# Patient Record
Sex: Female | Born: 1950
Health system: Southern US, Community
[De-identification: ages and names within clinical notes are randomized; demographics above are authoritative.]

## PROBLEM LIST (undated history)

## (undated) DIAGNOSIS — C8590 Non-Hodgkin lymphoma, unspecified, unspecified site: Secondary | ICD-10-CM

## (undated) DIAGNOSIS — R12 Heartburn: Secondary | ICD-10-CM

## (undated) DIAGNOSIS — M1990 Unspecified osteoarthritis, unspecified site: Secondary | ICD-10-CM

## (undated) DIAGNOSIS — E119 Type 2 diabetes mellitus without complications: Secondary | ICD-10-CM

## (undated) DIAGNOSIS — F419 Anxiety disorder, unspecified: Secondary | ICD-10-CM

## (undated) DIAGNOSIS — I10 Essential (primary) hypertension: Secondary | ICD-10-CM

## (undated) DIAGNOSIS — Z9484 Stem cells transplant status: Secondary | ICD-10-CM

## (undated) HISTORY — PX: TOTAL ABDOMINAL HYSTERECTOMY: SHX209

## (undated) HISTORY — DX: Unspecified osteoarthritis, unspecified site: M19.90

## (undated) HISTORY — DX: Heartburn: R12

## (undated) HISTORY — DX: Non-Hodgkin lymphoma, unspecified, unspecified site: C85.90

## (undated) HISTORY — DX: Essential (primary) hypertension: I10

## (undated) HISTORY — DX: Anxiety disorder, unspecified: F41.9

## (undated) HISTORY — DX: Type 2 diabetes mellitus without complications: E11.9

---

## 1997-06-06 ENCOUNTER — Ambulatory Visit (HOSPITAL_COMMUNITY): Admission: RE | Admit: 1997-06-06 | Discharge: 1997-06-06 | Payer: Self-pay | Admitting: Gynecology

## 1997-09-05 ENCOUNTER — Other Ambulatory Visit: Admission: RE | Admit: 1997-09-05 | Discharge: 1997-09-05 | Payer: Self-pay | Admitting: Gynecology

## 1998-11-13 ENCOUNTER — Other Ambulatory Visit: Admission: RE | Admit: 1998-11-13 | Discharge: 1998-11-13 | Payer: Self-pay | Admitting: Gynecology

## 1999-02-05 ENCOUNTER — Encounter: Payer: Self-pay | Admitting: Gynecology

## 1999-02-05 ENCOUNTER — Ambulatory Visit (HOSPITAL_COMMUNITY): Admission: RE | Admit: 1999-02-05 | Discharge: 1999-02-05 | Payer: Self-pay | Admitting: Gynecology

## 2000-02-11 ENCOUNTER — Ambulatory Visit (HOSPITAL_COMMUNITY): Admission: RE | Admit: 2000-02-11 | Discharge: 2000-02-11 | Payer: Self-pay | Admitting: Gynecology

## 2000-02-11 ENCOUNTER — Encounter: Payer: Self-pay | Admitting: Gynecology

## 2000-02-11 ENCOUNTER — Other Ambulatory Visit: Admission: RE | Admit: 2000-02-11 | Discharge: 2000-02-11 | Payer: Self-pay | Admitting: Gynecology

## 2000-02-13 ENCOUNTER — Encounter: Payer: Self-pay | Admitting: Gynecology

## 2000-02-13 ENCOUNTER — Encounter: Admission: RE | Admit: 2000-02-13 | Discharge: 2000-02-13 | Payer: Self-pay | Admitting: Gynecology

## 2000-10-27 ENCOUNTER — Observation Stay (HOSPITAL_COMMUNITY): Admission: EM | Admit: 2000-10-27 | Discharge: 2000-10-28 | Payer: Self-pay | Admitting: Emergency Medicine

## 2000-10-27 ENCOUNTER — Encounter: Payer: Self-pay | Admitting: Emergency Medicine

## 2001-02-16 ENCOUNTER — Encounter: Admission: RE | Admit: 2001-02-16 | Discharge: 2001-02-16 | Payer: Self-pay | Admitting: Gynecology

## 2001-02-16 ENCOUNTER — Encounter: Payer: Self-pay | Admitting: Gynecology

## 2001-06-01 ENCOUNTER — Other Ambulatory Visit: Admission: RE | Admit: 2001-06-01 | Discharge: 2001-06-01 | Payer: Self-pay | Admitting: Gynecology

## 2001-11-30 ENCOUNTER — Encounter: Admission: RE | Admit: 2001-11-30 | Discharge: 2001-11-30 | Payer: Self-pay | Admitting: Thoracic Surgery

## 2001-11-30 ENCOUNTER — Encounter: Payer: Self-pay | Admitting: Thoracic Surgery

## 2001-12-06 ENCOUNTER — Encounter: Admission: RE | Admit: 2001-12-06 | Discharge: 2001-12-06 | Payer: Self-pay | Admitting: Thoracic Surgery

## 2001-12-06 ENCOUNTER — Encounter: Payer: Self-pay | Admitting: Thoracic Surgery

## 2001-12-17 ENCOUNTER — Ambulatory Visit (HOSPITAL_COMMUNITY): Admission: RE | Admit: 2001-12-17 | Discharge: 2001-12-17 | Payer: Self-pay | Admitting: Oncology

## 2001-12-17 ENCOUNTER — Encounter (HOSPITAL_COMMUNITY): Payer: Self-pay | Admitting: Oncology

## 2001-12-30 ENCOUNTER — Encounter (INDEPENDENT_AMBULATORY_CARE_PROVIDER_SITE_OTHER): Payer: Self-pay

## 2001-12-30 ENCOUNTER — Ambulatory Visit (HOSPITAL_COMMUNITY): Admission: RE | Admit: 2001-12-30 | Discharge: 2001-12-30 | Payer: Self-pay | Admitting: Oncology

## 2002-01-13 DIAGNOSIS — C8590 Non-Hodgkin lymphoma, unspecified, unspecified site: Secondary | ICD-10-CM

## 2002-01-13 HISTORY — DX: Non-Hodgkin lymphoma, unspecified, unspecified site: C85.90

## 2002-01-14 ENCOUNTER — Ambulatory Visit (HOSPITAL_COMMUNITY): Admission: RE | Admit: 2002-01-14 | Discharge: 2002-01-14 | Payer: Self-pay | Admitting: Oncology

## 2002-01-14 ENCOUNTER — Encounter (HOSPITAL_COMMUNITY): Payer: Self-pay | Admitting: Oncology

## 2002-05-24 ENCOUNTER — Encounter (HOSPITAL_COMMUNITY): Payer: Self-pay | Admitting: Oncology

## 2002-05-24 ENCOUNTER — Ambulatory Visit (HOSPITAL_COMMUNITY): Admission: RE | Admit: 2002-05-24 | Discharge: 2002-05-24 | Payer: Self-pay | Admitting: Oncology

## 2002-05-26 ENCOUNTER — Encounter (HOSPITAL_COMMUNITY): Payer: Self-pay | Admitting: Oncology

## 2002-05-26 ENCOUNTER — Ambulatory Visit (HOSPITAL_COMMUNITY): Admission: RE | Admit: 2002-05-26 | Discharge: 2002-05-26 | Payer: Self-pay | Admitting: Oncology

## 2002-05-27 ENCOUNTER — Encounter (HOSPITAL_COMMUNITY): Payer: Self-pay | Admitting: Oncology

## 2002-05-27 ENCOUNTER — Ambulatory Visit (HOSPITAL_COMMUNITY): Admission: RE | Admit: 2002-05-27 | Discharge: 2002-05-27 | Payer: Self-pay | Admitting: Oncology

## 2002-06-08 ENCOUNTER — Ambulatory Visit (HOSPITAL_COMMUNITY): Admission: RE | Admit: 2002-06-08 | Discharge: 2002-06-08 | Payer: Self-pay | Admitting: Oncology

## 2002-06-08 ENCOUNTER — Encounter (INDEPENDENT_AMBULATORY_CARE_PROVIDER_SITE_OTHER): Payer: Self-pay | Admitting: *Deleted

## 2003-06-20 ENCOUNTER — Other Ambulatory Visit: Admission: RE | Admit: 2003-06-20 | Discharge: 2003-06-20 | Payer: Self-pay | Admitting: Gynecology

## 2004-07-02 ENCOUNTER — Encounter: Admission: RE | Admit: 2004-07-02 | Discharge: 2004-07-02 | Payer: Self-pay | Admitting: Gynecology

## 2005-07-23 ENCOUNTER — Other Ambulatory Visit: Admission: RE | Admit: 2005-07-23 | Discharge: 2005-07-23 | Payer: Self-pay | Admitting: Gynecology

## 2005-08-12 ENCOUNTER — Encounter: Admission: RE | Admit: 2005-08-12 | Discharge: 2005-08-12 | Payer: Self-pay | Admitting: Gynecology

## 2006-08-04 ENCOUNTER — Other Ambulatory Visit: Admission: RE | Admit: 2006-08-04 | Discharge: 2006-08-04 | Payer: Self-pay | Admitting: Gynecology

## 2006-08-25 ENCOUNTER — Encounter: Admission: RE | Admit: 2006-08-25 | Discharge: 2006-08-25 | Payer: Self-pay | Admitting: Gynecology

## 2008-07-21 ENCOUNTER — Encounter: Admission: RE | Admit: 2008-07-21 | Discharge: 2008-07-21 | Payer: Self-pay | Admitting: Emergency Medicine

## 2008-07-21 IMAGING — US US ABDOMEN COMPLETE
1 series · 13 of 25 positions shown · non-contrast
Comparison: None

CLINICAL DATA: 3 days abdominal pain, nausea, diabetes,
hypertension, smoker.

COMPLETE ABDOMINAL ULTRASOUND

[Series 1: us abdomen complete · 0.28mm/px · 13 of 76 slices shown]
[im 1/76]
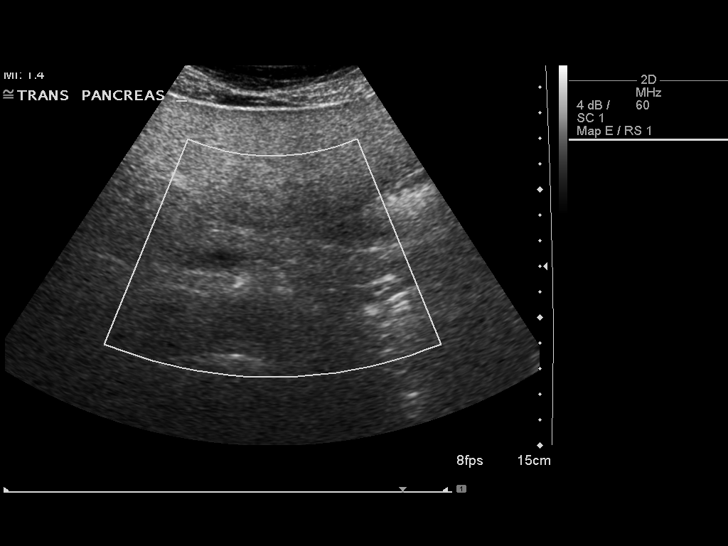
[im 7/76]
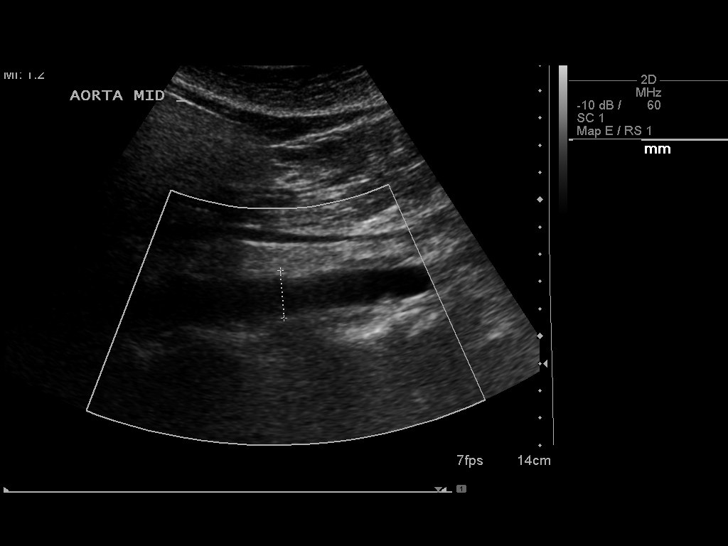
[im 13/76]
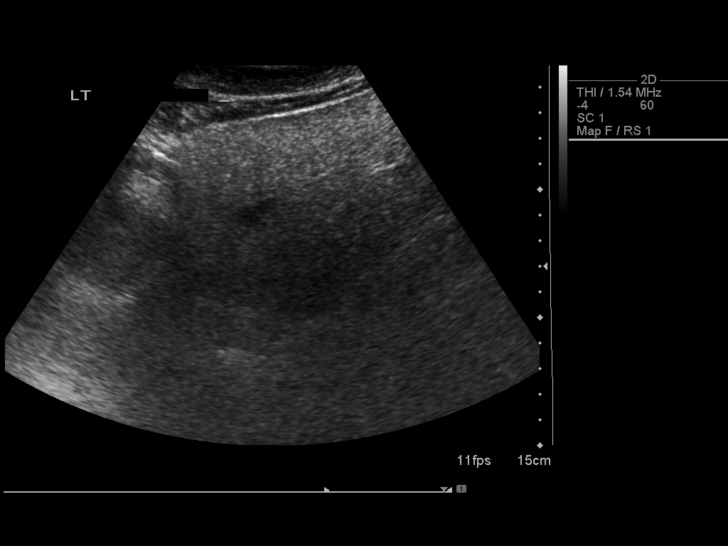
[im 19/76]
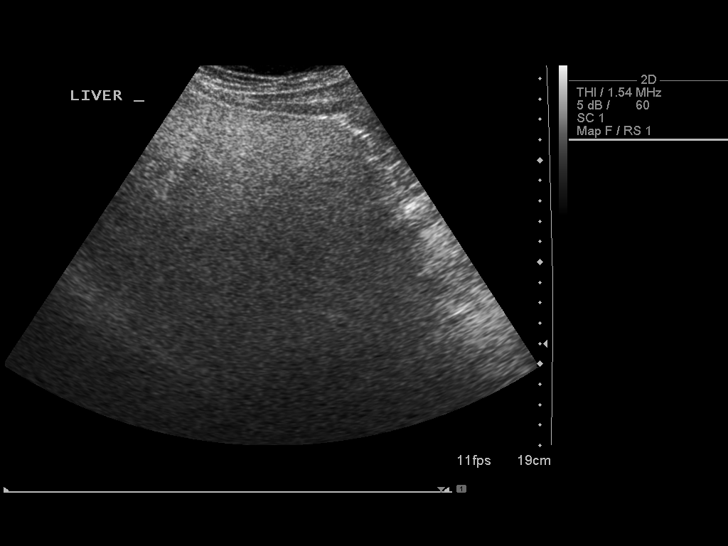
[im 26/76]
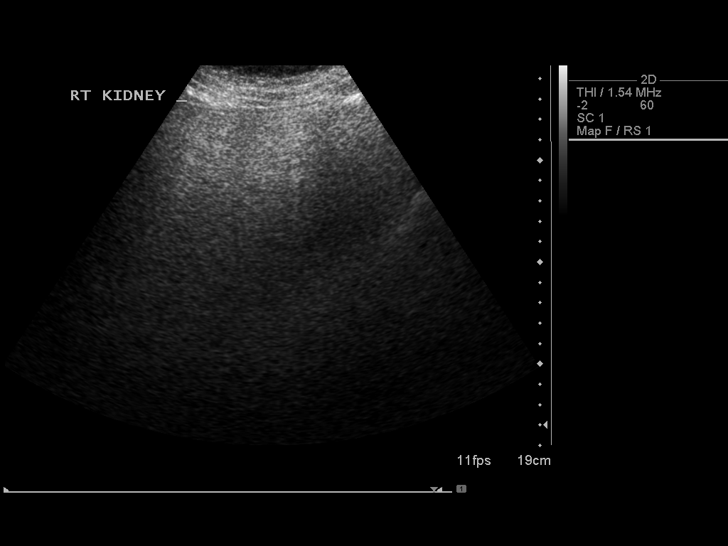
[im 32/76]
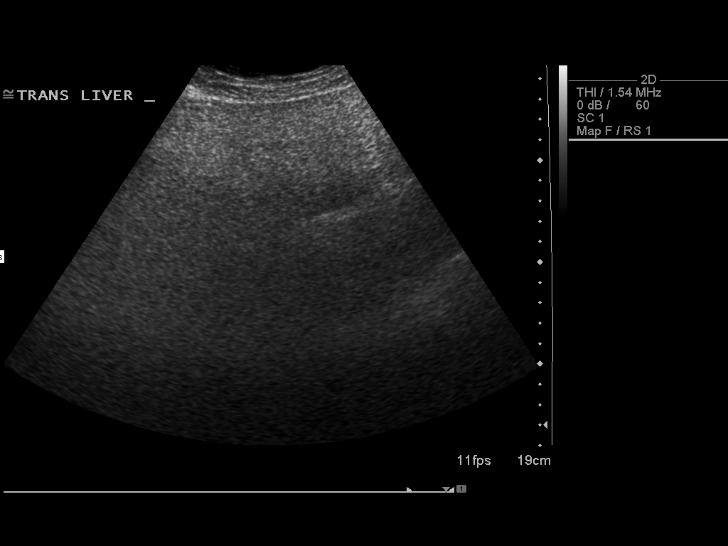
[im 38/76]
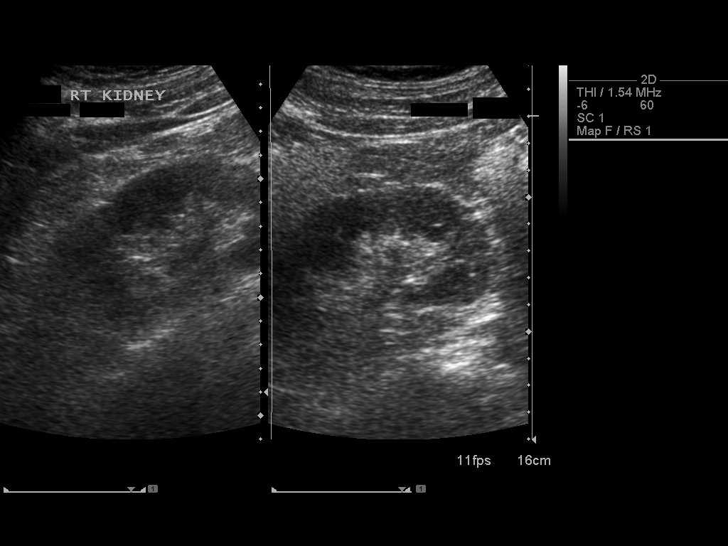
[im 44/76]
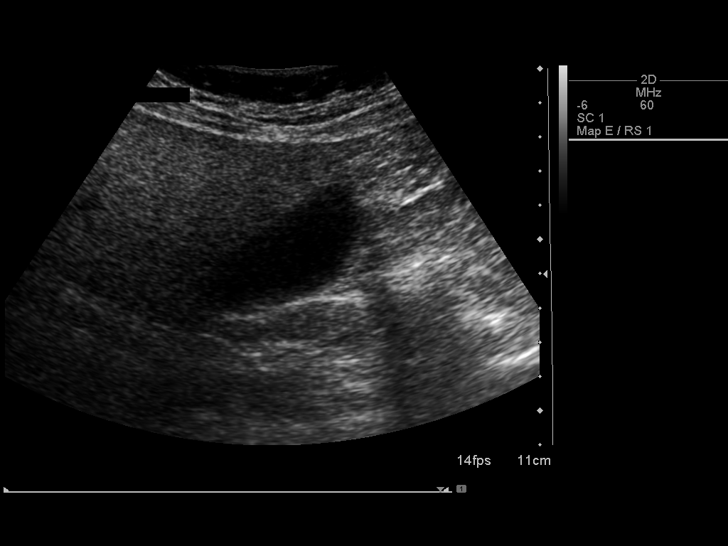
[im 51/76]
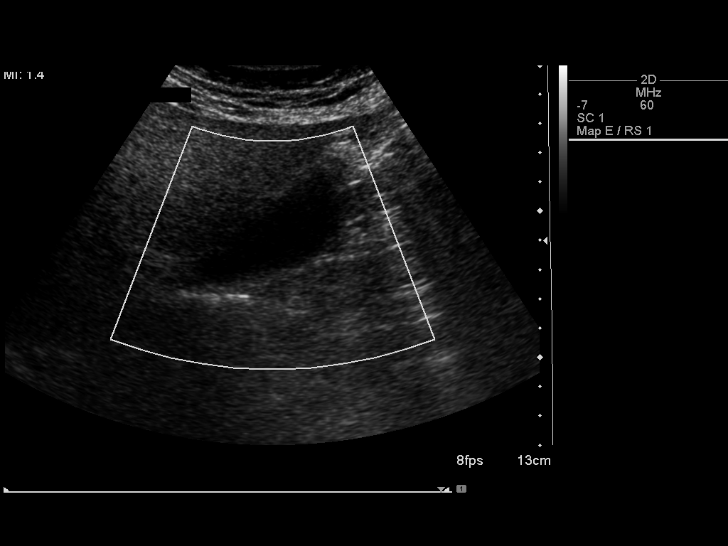
[im 57/76]
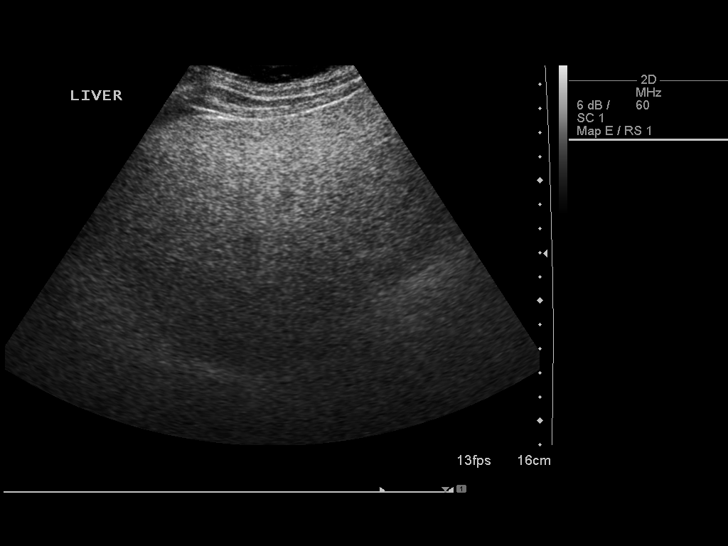
[im 63/76]
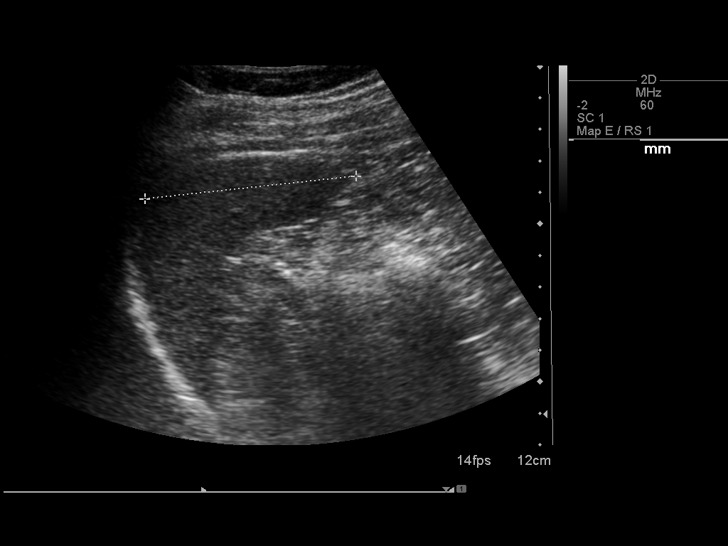
[im 69/76]
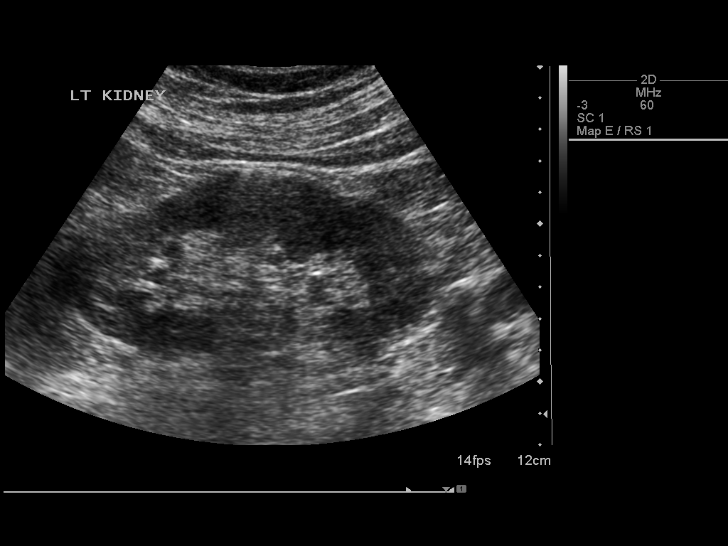
[im 76/76]
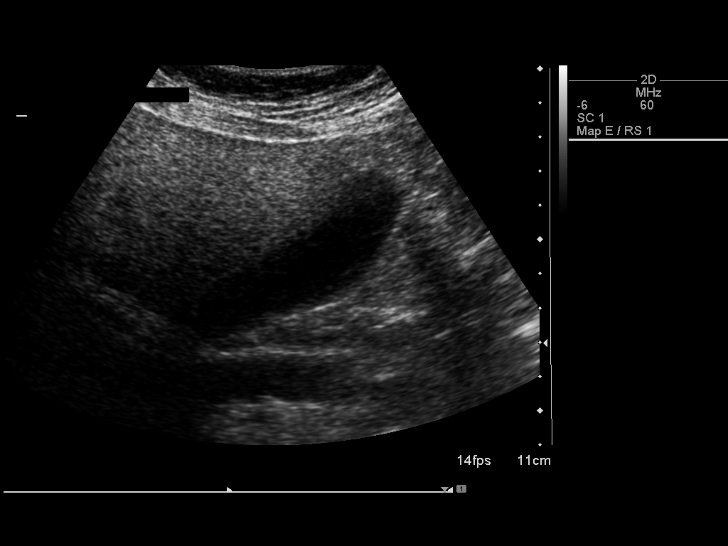

[13 of 25 positions shown; findings below may reference images not displayed]

FINDINGS: Gallbladder:  Gallbladder appears sonographically normal without
sludge or gallstones with normal 2 mm wall thickness.  The patient
experienced discomfort scanning over gallbladder.

Common bile duct:  No dilated intrahepatic or extrahepatic bile
ducts are seen with common bile duct measuring normally at 3 mm.

Liver:  Liver size is normal with diffuse increase echogenicity
consistent with fatty change.  No focal lesions visualized.

IVC:  Sonographically normal.

Pancreas:  The pancreatic body visualized appears normal with head
and tail obscured due to the patient's body habitus and overlying
gastrointestinal gas.

Spleen:  Sonographically normal measuring 6.7 cm long.

Right Kidney:  Sonographically normal measuring 11.3 cm long.

Left Kidney:  Sonographically normal measuring 11.5 cm long.

Abdominal aorta:  Sonographically normal with proximal maximum
diameter 2.4 cm.

No free fluid noted.
IMPRESSION: 1.  Limited visualization of pancreatic head and tail.
2.  Diffuse fatty infiltration liver.
3.  Otherwise, negative.  (The patient experienced discomfort
scanning over region of the liver and gallbladder).

## 2010-01-22 ENCOUNTER — Emergency Department: Payer: Self-pay | Admitting: Emergency Medicine

## 2010-01-22 IMAGING — US ABDOMEN ULTRASOUND LIMITED
1 series · 17 of 25 positions shown · non-contrast
Comparison: none

REASON FOR EXAM: RUQ pain
COMMENTS:

PROCEDURE:     US  - US ABDOMEN LIMITED SURVEY  - [DATE]  [DATE]
RESULT:     Comparison: None
TECHNIQUE: Multiple gray-scale and color-flow Doppler images of the right
upper quadrant are presented for review.

[Series 1: abdomen ultrasound limited · 17 of 74 slices shown]
[im 1/74]
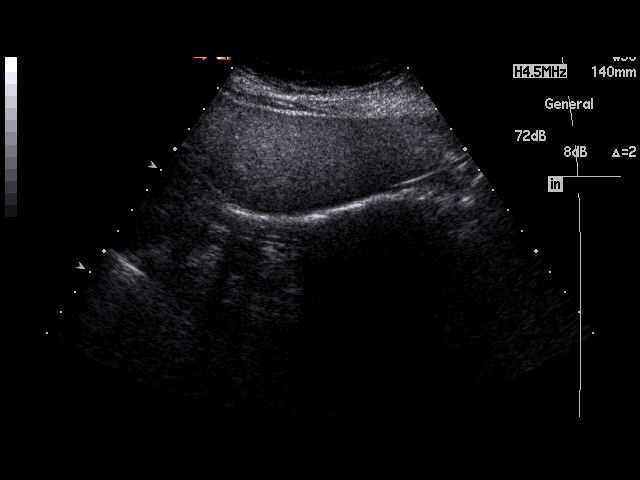
[im 7/74]
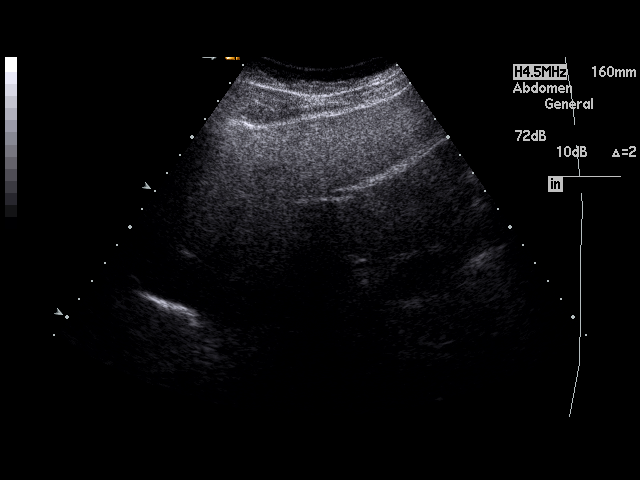
[im 10/74]
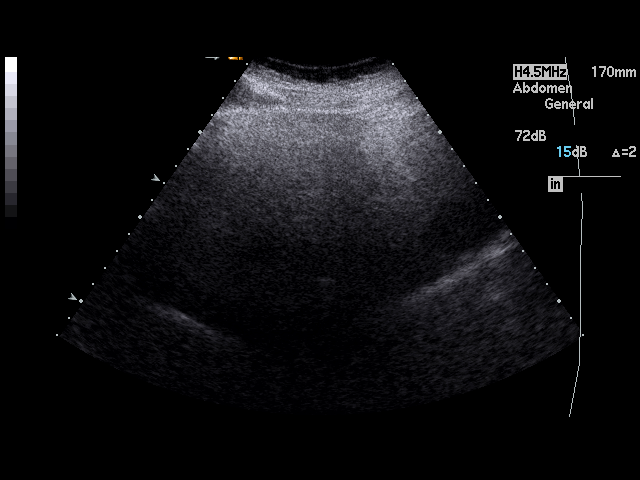
[im 16/74]
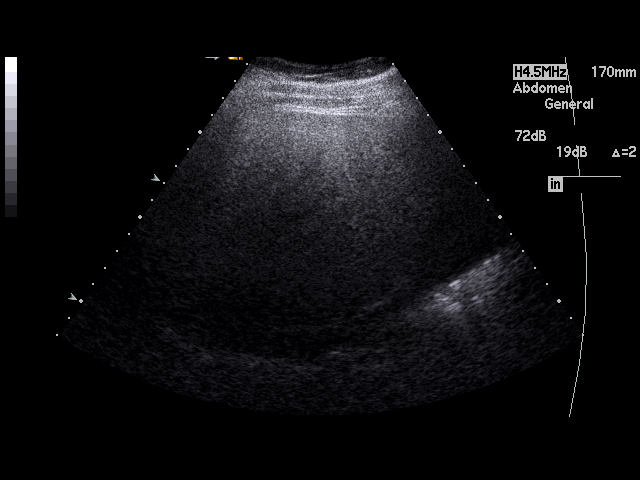
[im 19/74]
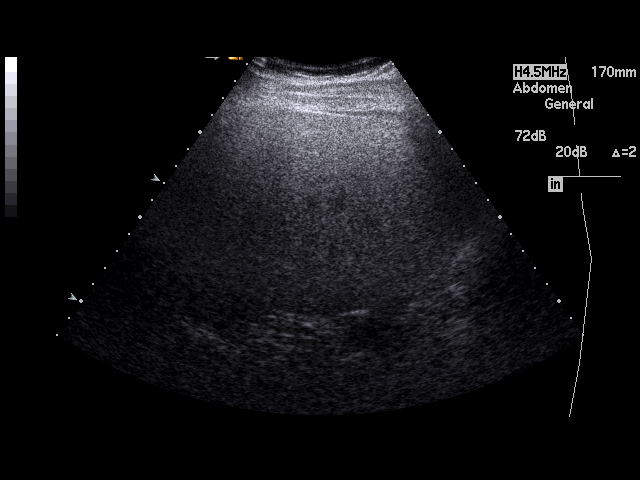
[im 25/74]
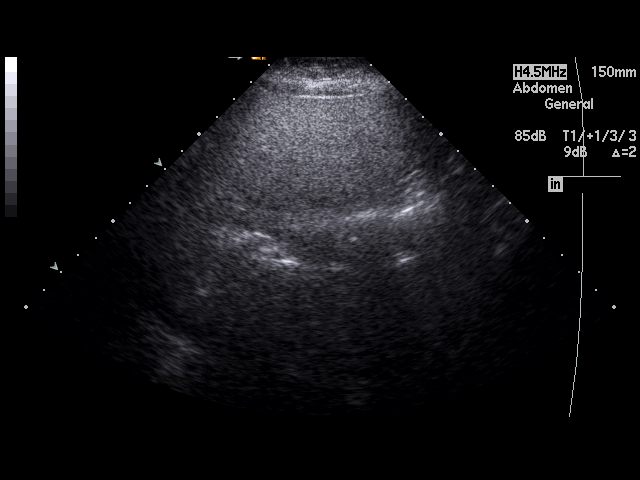
[im 28/74]
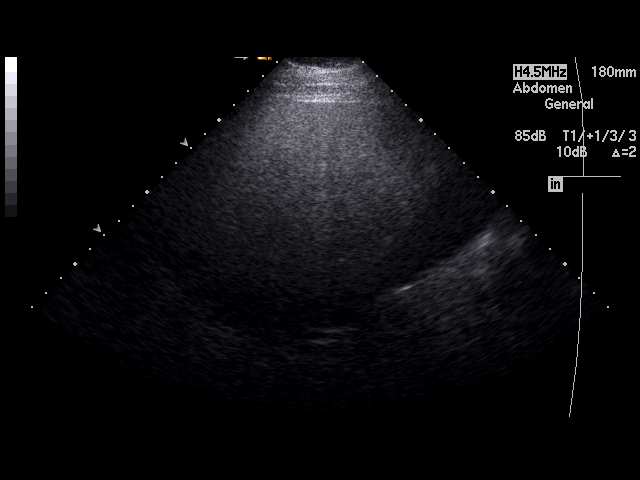
[im 34/74]
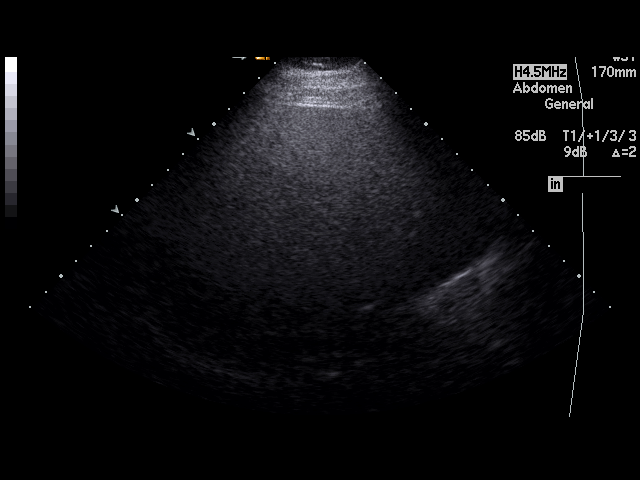
[im 37/74]
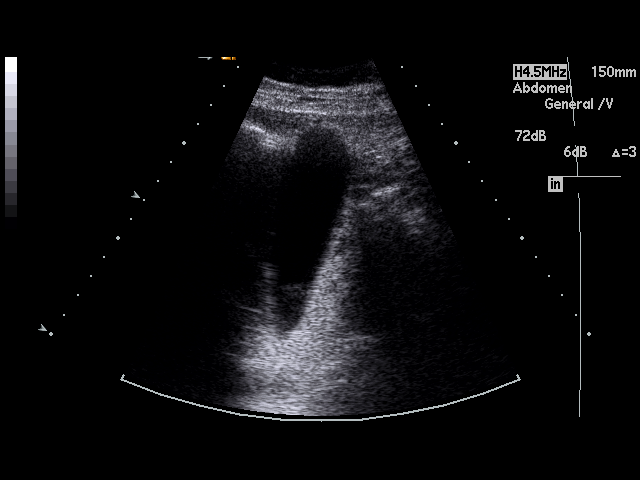
[im 40/74]
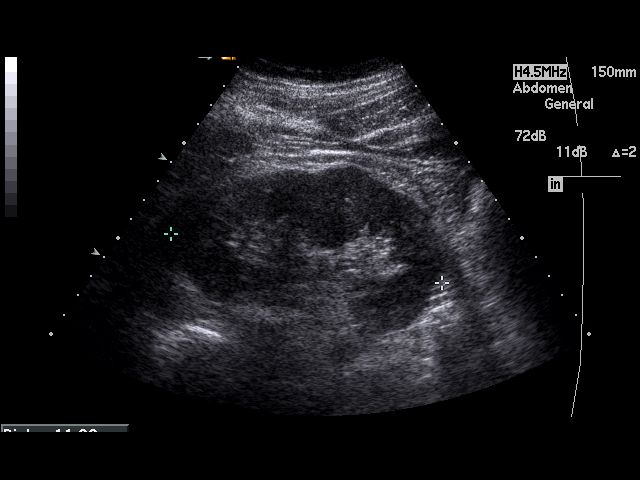
[im 46/74]
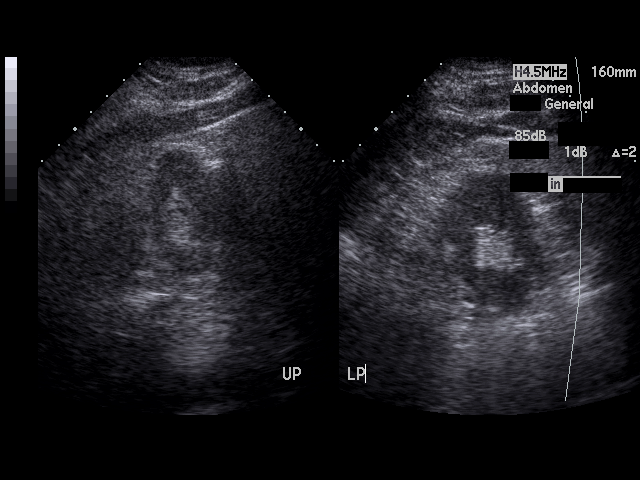
[im 49/74]
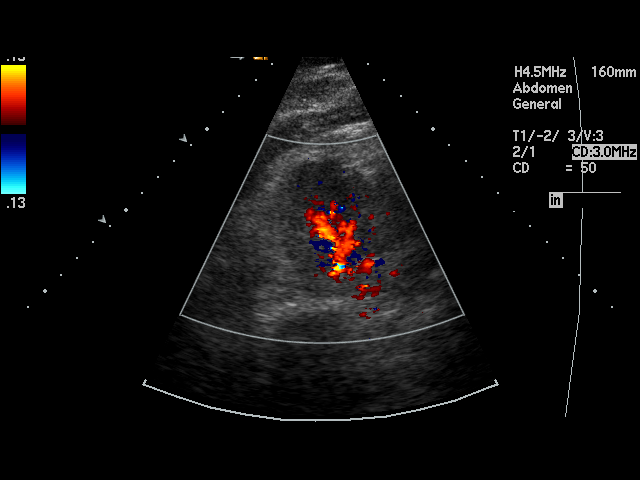
[im 55/74]
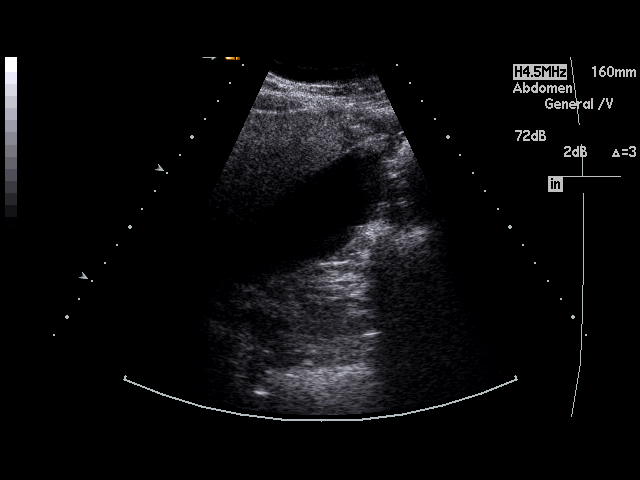
[im 58/74]
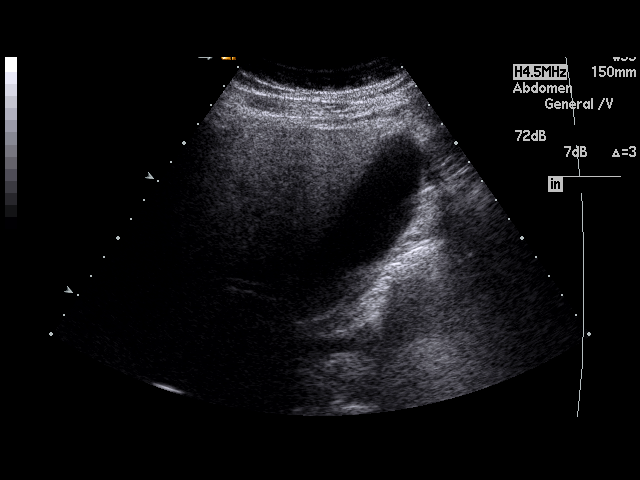
[im 64/74]
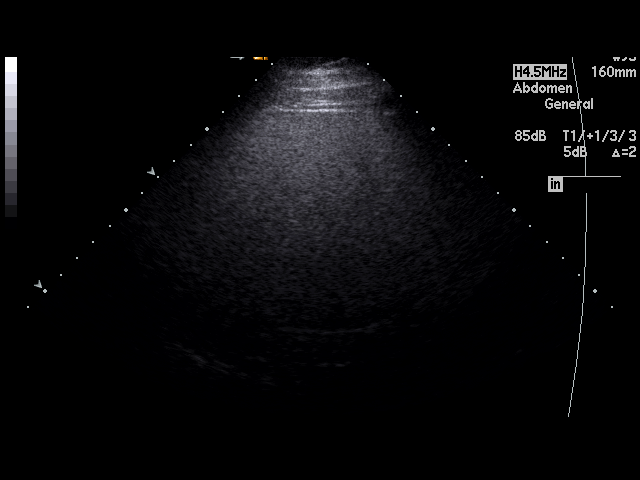
[im 67/74]
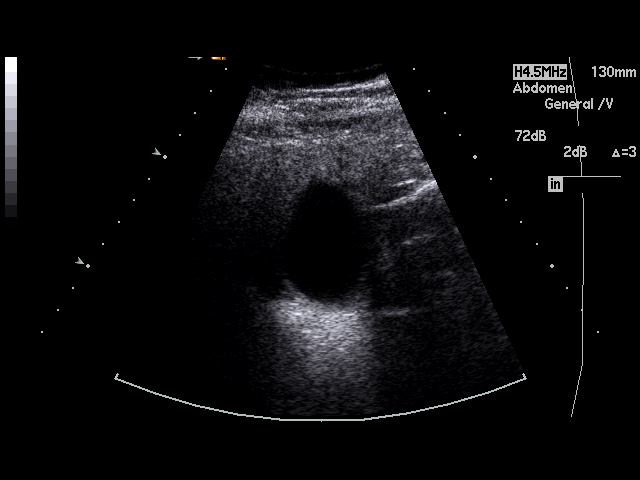
[im 74/74]
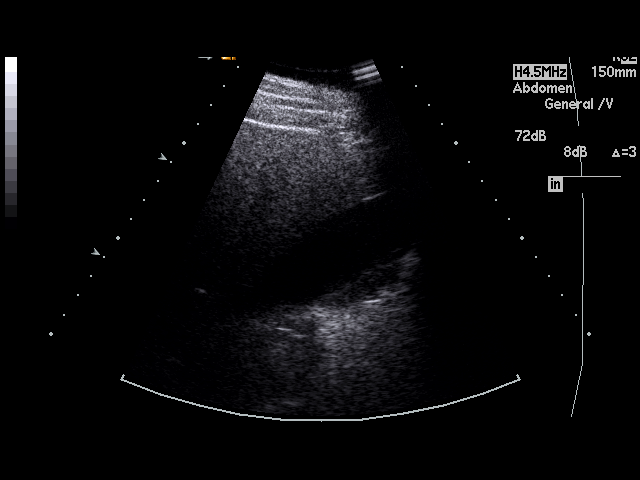

[17 of 25 positions shown; findings below may reference images not displayed]

FINDINGS: The liver demonstrated a coarse echotexture. The liver is without evidence
of a focal hepatic lesion.

There is no cholelithiasis or biliary sludge. There is no intra- or
extrahepatic biliary ductal dilatation. The common duct measures 3.4 mm in
maximal diameter. There is no gallbladder wall thickening, pericholecystic
fluid, or sonographic Murphy's sign.

The pancreas is not visualized secondary to overlying bowel gas. The right
kidney is normal in echogenicity and size. The right kidney measures 11.8 x
4.7 x 5.3 cm. There are no renal calculi or hydronephrosis. There is a 2 cm
hypoechoic masslike area in the interpolar aspect of the right kidney.
IMPRESSION: No cholelithiasis or sonographic evidence of acute cholecystitis.

There is a 2 cm hypoechoic masslike area in the interpolar aspect of the
right kidney. Recommend further evaluation with a renal mass protocol CT of
the abdomen.

## 2010-01-22 IMAGING — CT CT ABD-PELV W/ CM
1 of 2 series · 15 of 32 positions shown, 19 images · IV contrast (isovue)
Comparison: None

REASON FOR EXAM: (1) abd pain MARKEINO;MARKEINO when she had bowel necrosis; (2) abd
pain
COMMENTS:

PROCEDURE:     CT  - CT ABDOMEN / PELVIS  W  - [DATE]  [DATE]
RESULT:     History: Abdominal pain
TECHNIQUE: Multiple axial images of the abdomen and pelvis were performed
from the lung bases to the pubic symphysis, with p.o. contrast and with 100
ml of Isovue 370 intravenous contrast.

[Series 2: 3mm soft tissue · axial · 0.66mm/px · z∈[-624,-210]mm · 15 of 150 slices shown, 19 images]
[im 6/150  soft-tissue]
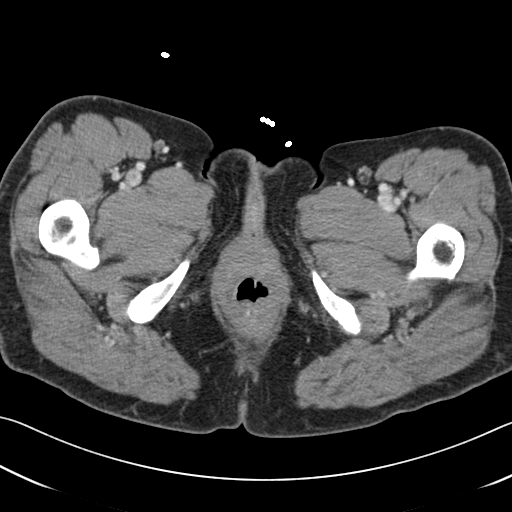
[im 6/150  bone]
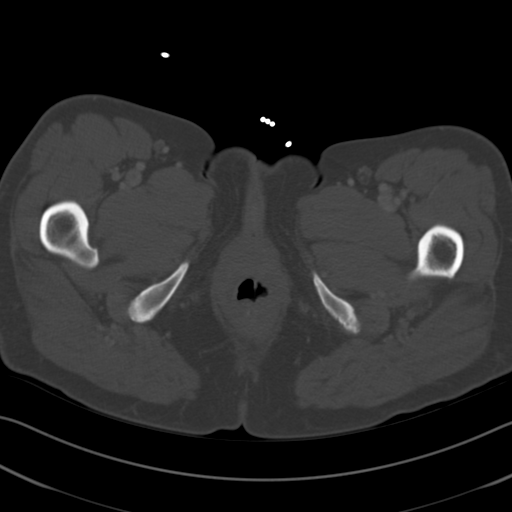
[im 18/150  soft-tissue]
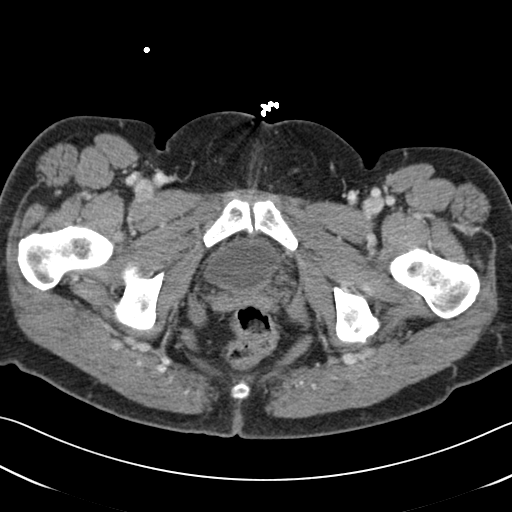
[im 29/150  soft-tissue]
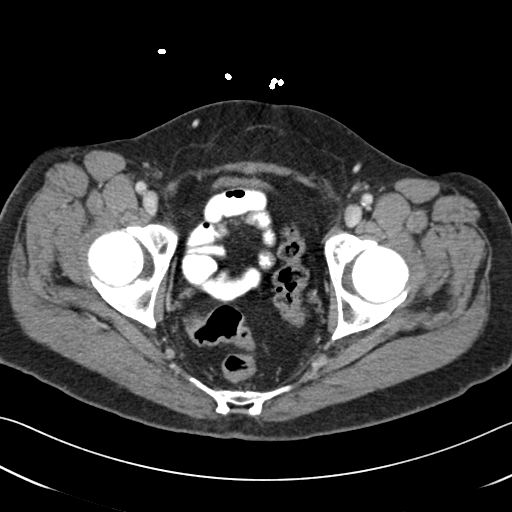
[im 41/150  soft-tissue]
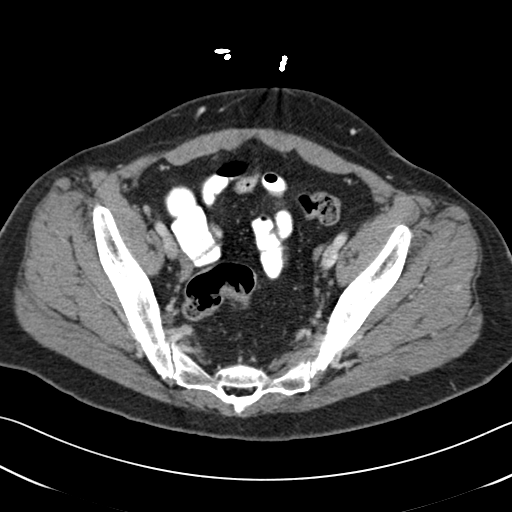
[im 52/150  soft-tissue]
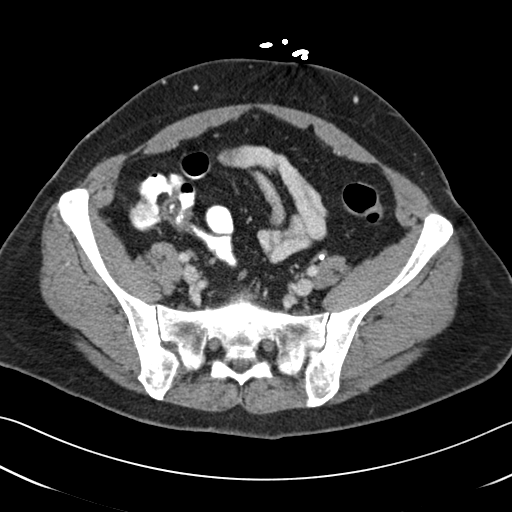
[im 64/150  soft-tissue]
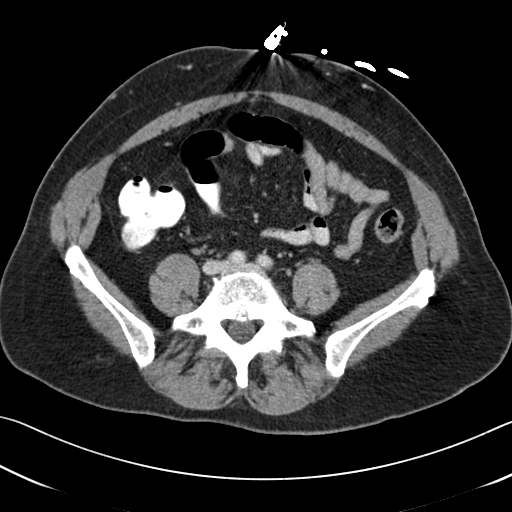
[im 75/150  soft-tissue]
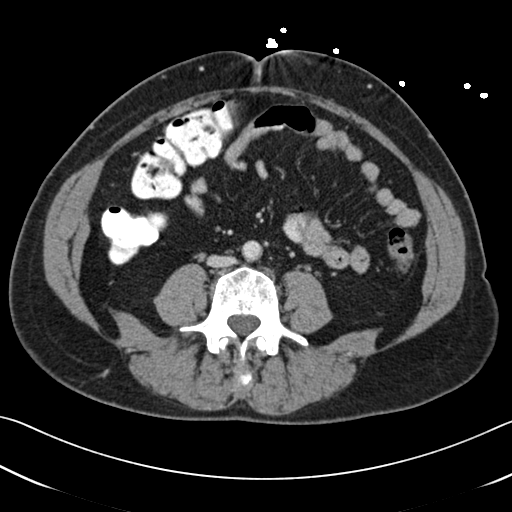
[im 86/150  soft-tissue]
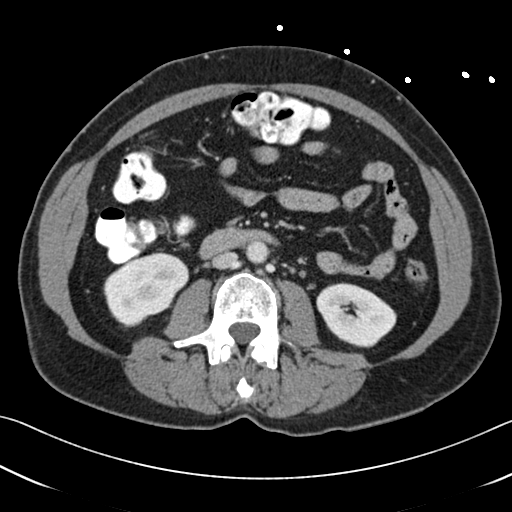
[im 98/150  soft-tissue]
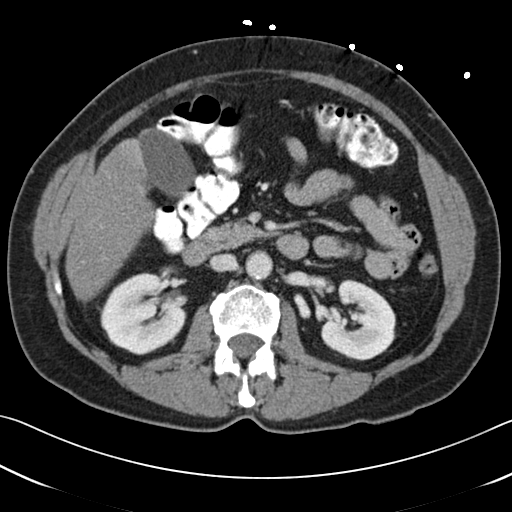
[im 98/150  bone]
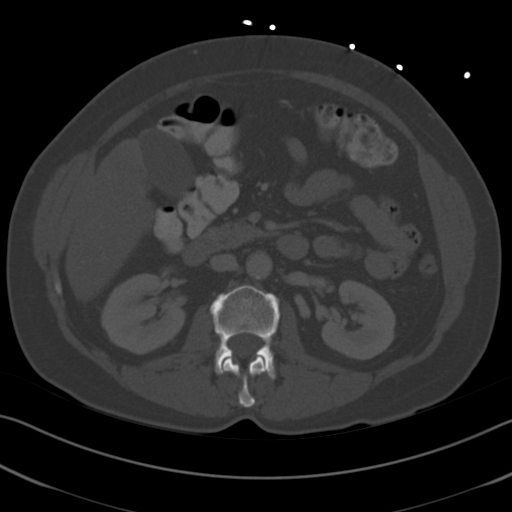
[im 109/150  soft-tissue]
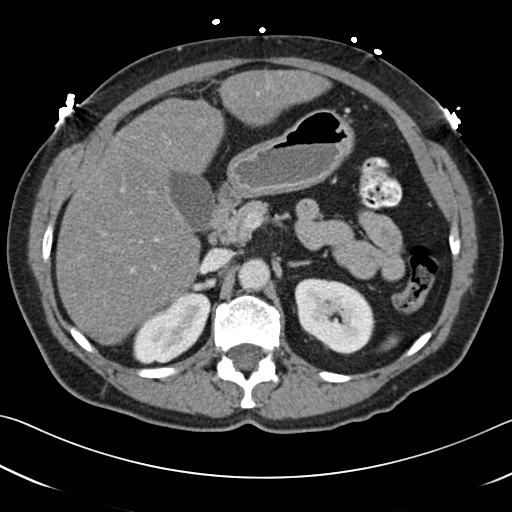
[im 121/150  soft-tissue]
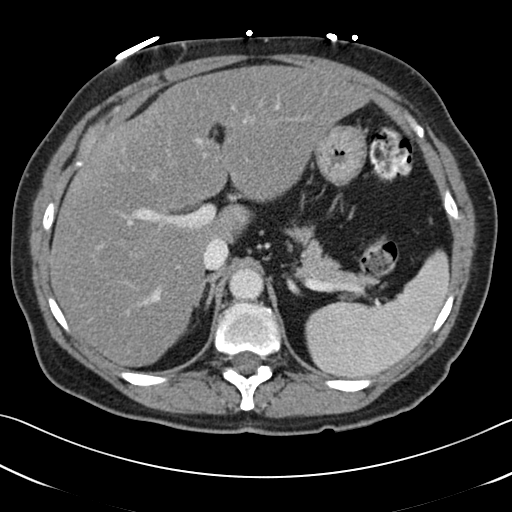
[im 127/150  lung]
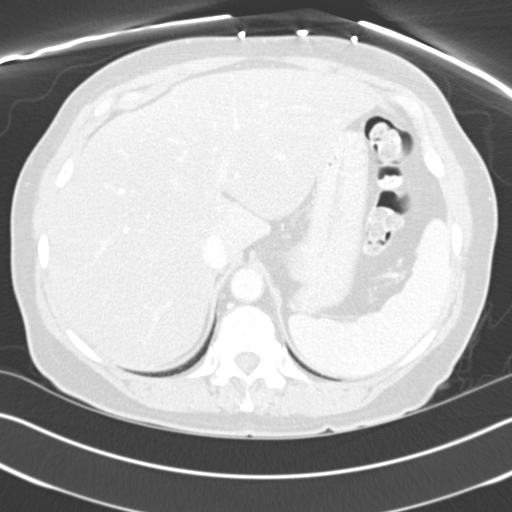
[im 132/150  soft-tissue]
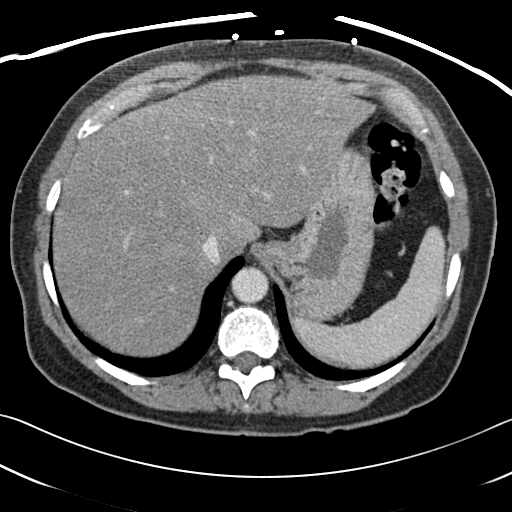
[im 132/150  lung]
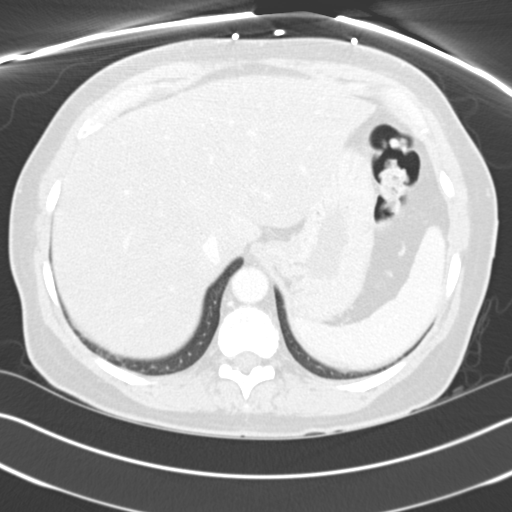
[im 138/150  lung]
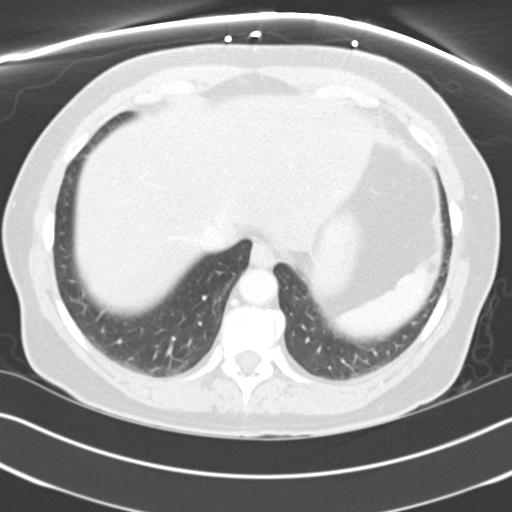
[im 144/150  soft-tissue]
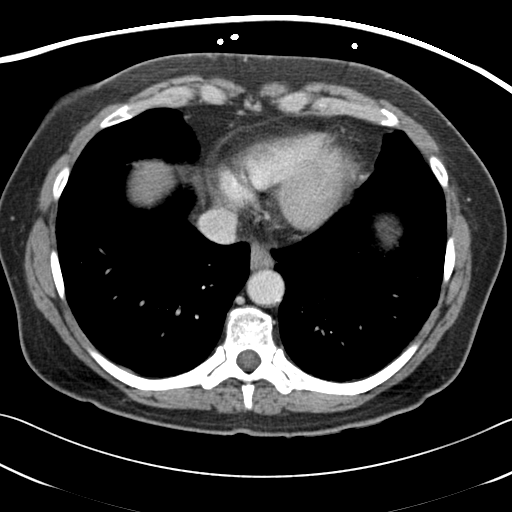
[im 144/150  lung]
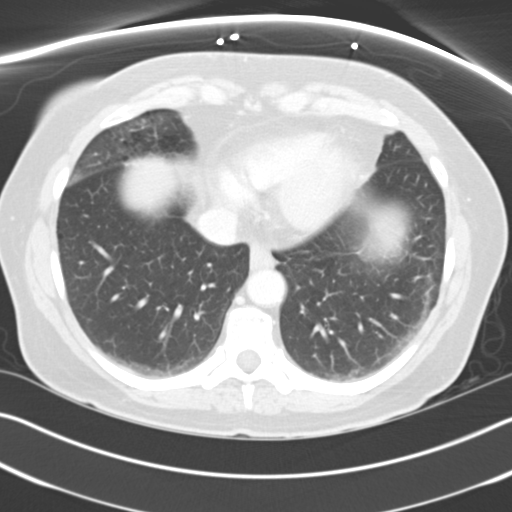

[15 of 32 positions shown; findings below may reference images not displayed]

FINDINGS: Minimal right middle lobe airspace disease which may represent atelectasis
versus developing infiltrate. There is no pneumothorax. The heart size is
normal.

The liver is diffusely low in attenuation likely secondary to hepatic
steatosis. There is no intrahepatic or extrahepatic biliary ductal
dilatation. The gallbladder is unremarkable. The spleen demonstrates no
focal abnormality. The kidneys, adrenal glands, and pancreas are normal. The
bladder is unremarkable.

The stomach, duodenum, small intestine, and large intestine demonstrate no
contrast extravasation or dilatation. There is a normal caliber appendix in
the right lower quadrant without periappendiceal inflammatory changes. There
is no pneumoperitoneum, pneumatosis, or portal venous gas. There is no
abdominal or pelvic free fluid. There is no lymphadenopathy.

The abdominal aorta is normal in caliber with atherosclerosis. Incidental
note is made of a retroaortic left renal vein.

The osseous structures are unremarkable.
IMPRESSION: No acute abdominal or pelvic pathology.

Minimal right middle lobe airspace disease which may represent atelectasis
versus developing infiltrate.

## 2010-01-22 IMAGING — CR DG CHEST 1V PORT
1 series · 1 of 1 positions shown · non-contrast
Comparison: none

REASON FOR EXAM: epigastric pain
COMMENTS:

[view not recorded]
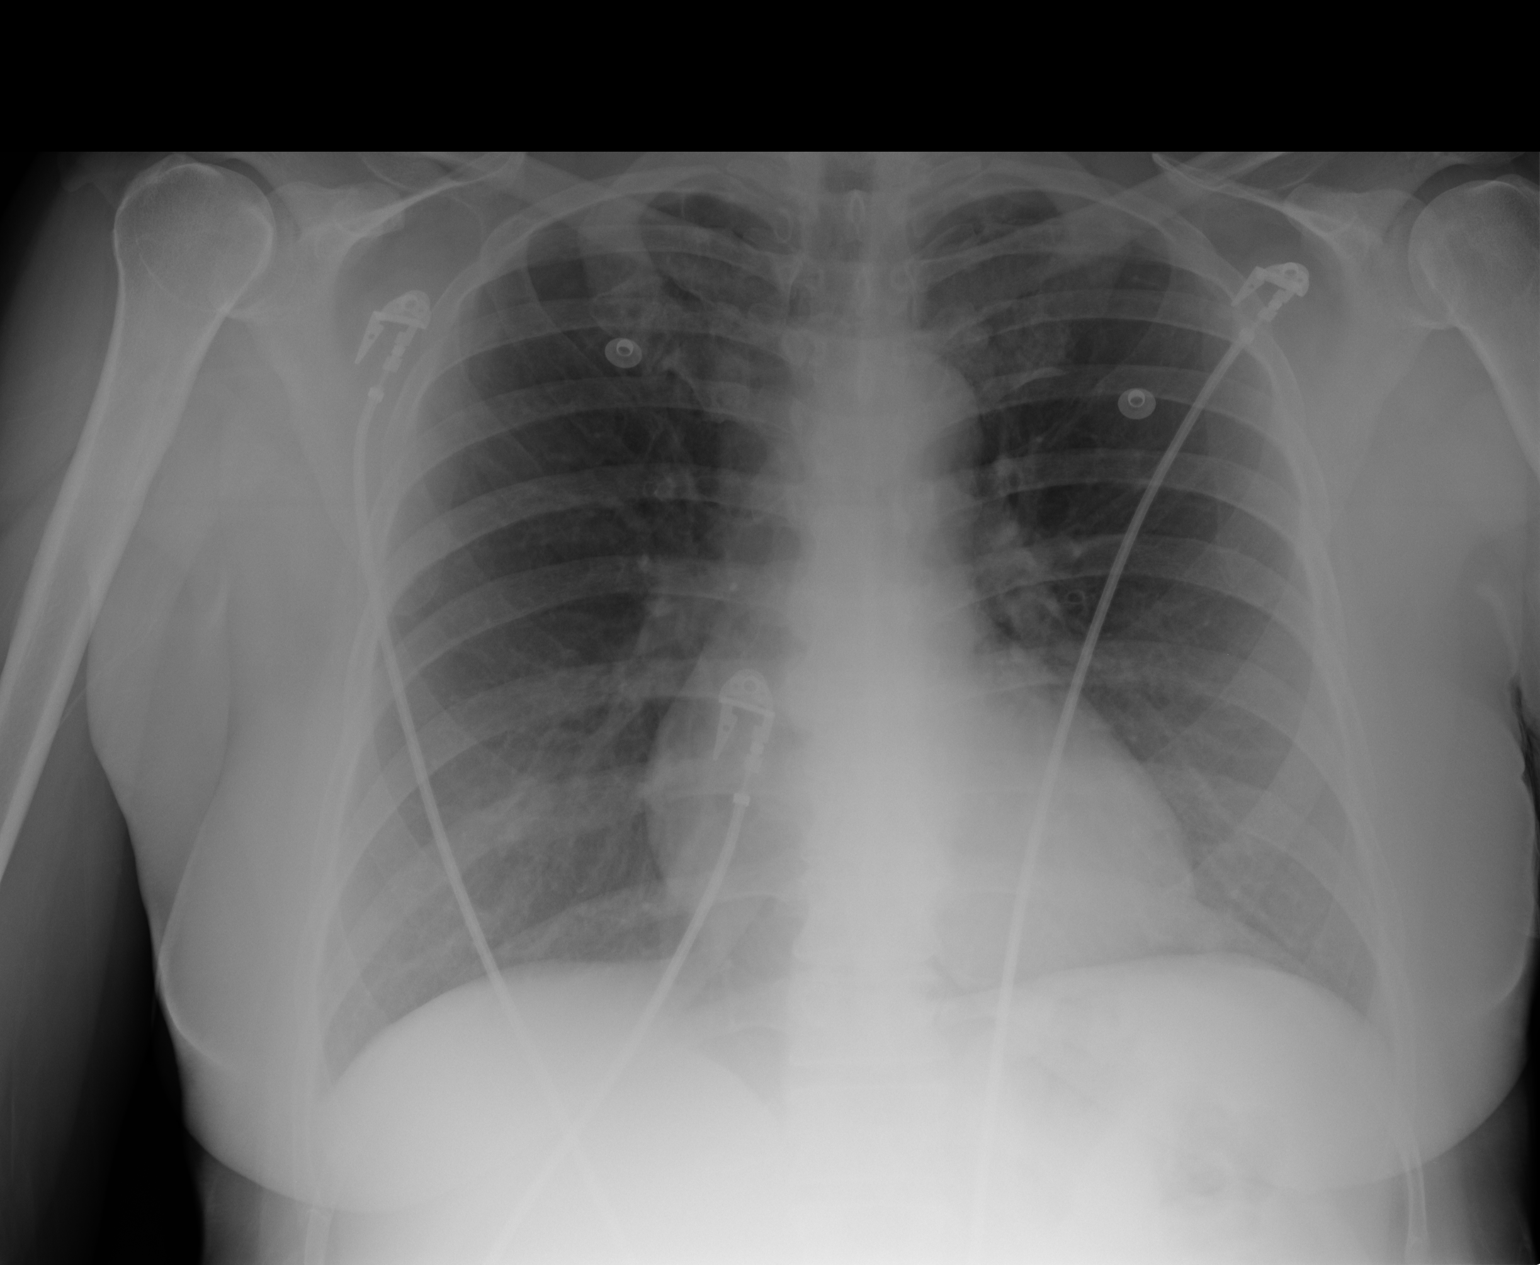

[1 of 1 positions shown; findings below may reference images not displayed]

PROCEDURE:     DXR - DXR PORTABLE CHEST SINGLE VIEW  - [DATE]  [DATE]

RESULT:     There is no previous exam for comparison area

Cardiac monitoring electrodes are present. The lungs are clear. The heart
and pulmonary vessels are normal. The bony and mediastinal structures are
unremarkable. There is no effusion. There is no pneumothorax or evidence of
congestive failure.
IMPRESSION: No acute cardiopulmonary disease.

## 2010-01-24 ENCOUNTER — Emergency Department (HOSPITAL_COMMUNITY)
Admission: EM | Admit: 2010-01-24 | Discharge: 2010-01-24 | Payer: Self-pay | Source: Home / Self Care | Admitting: Emergency Medicine

## 2010-01-24 IMAGING — CR DG ABDOMEN 2V
2 series · 2 of 2 positions shown · non-contrast
Comparison: None.

CLINICAL DATA: Right upper quadrant pain and nausea.

ABDOMEN - 2 VIEW

[w abdomen upright *]
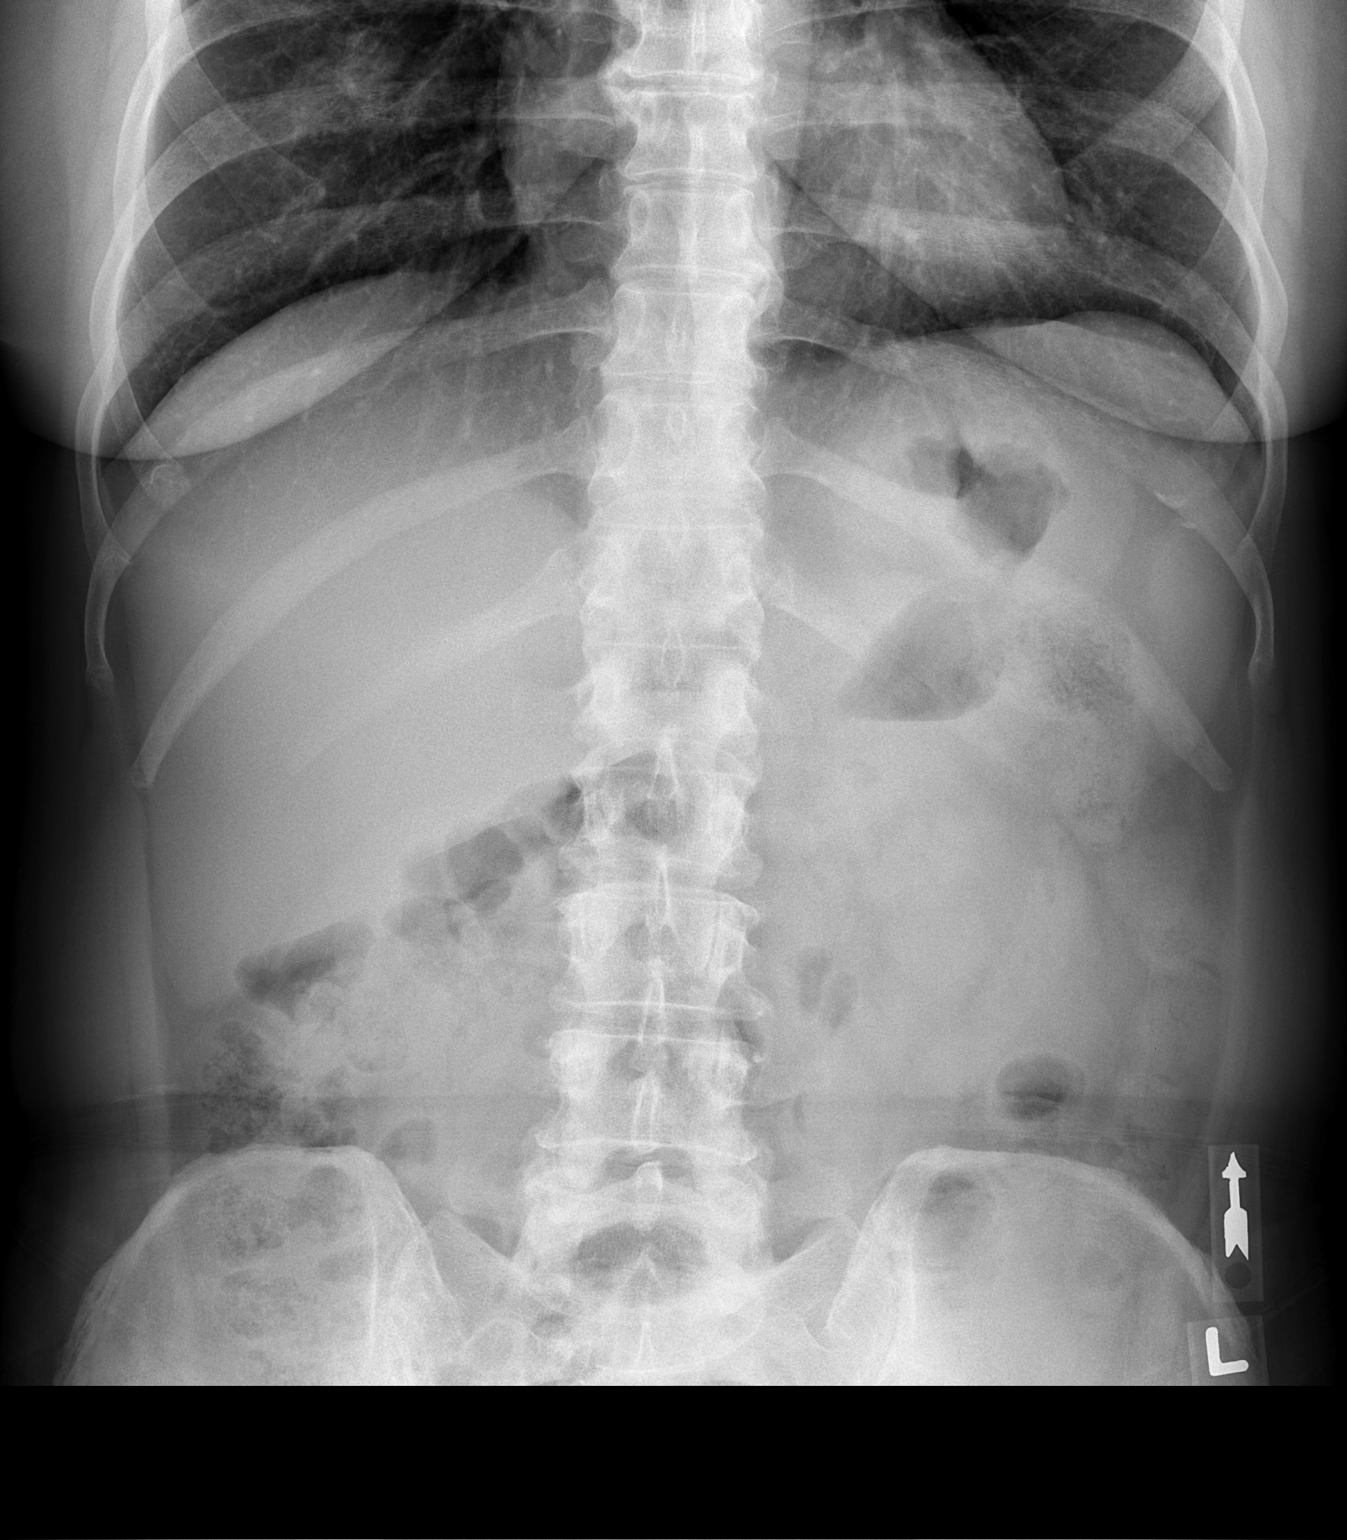

[t abdomen supine]
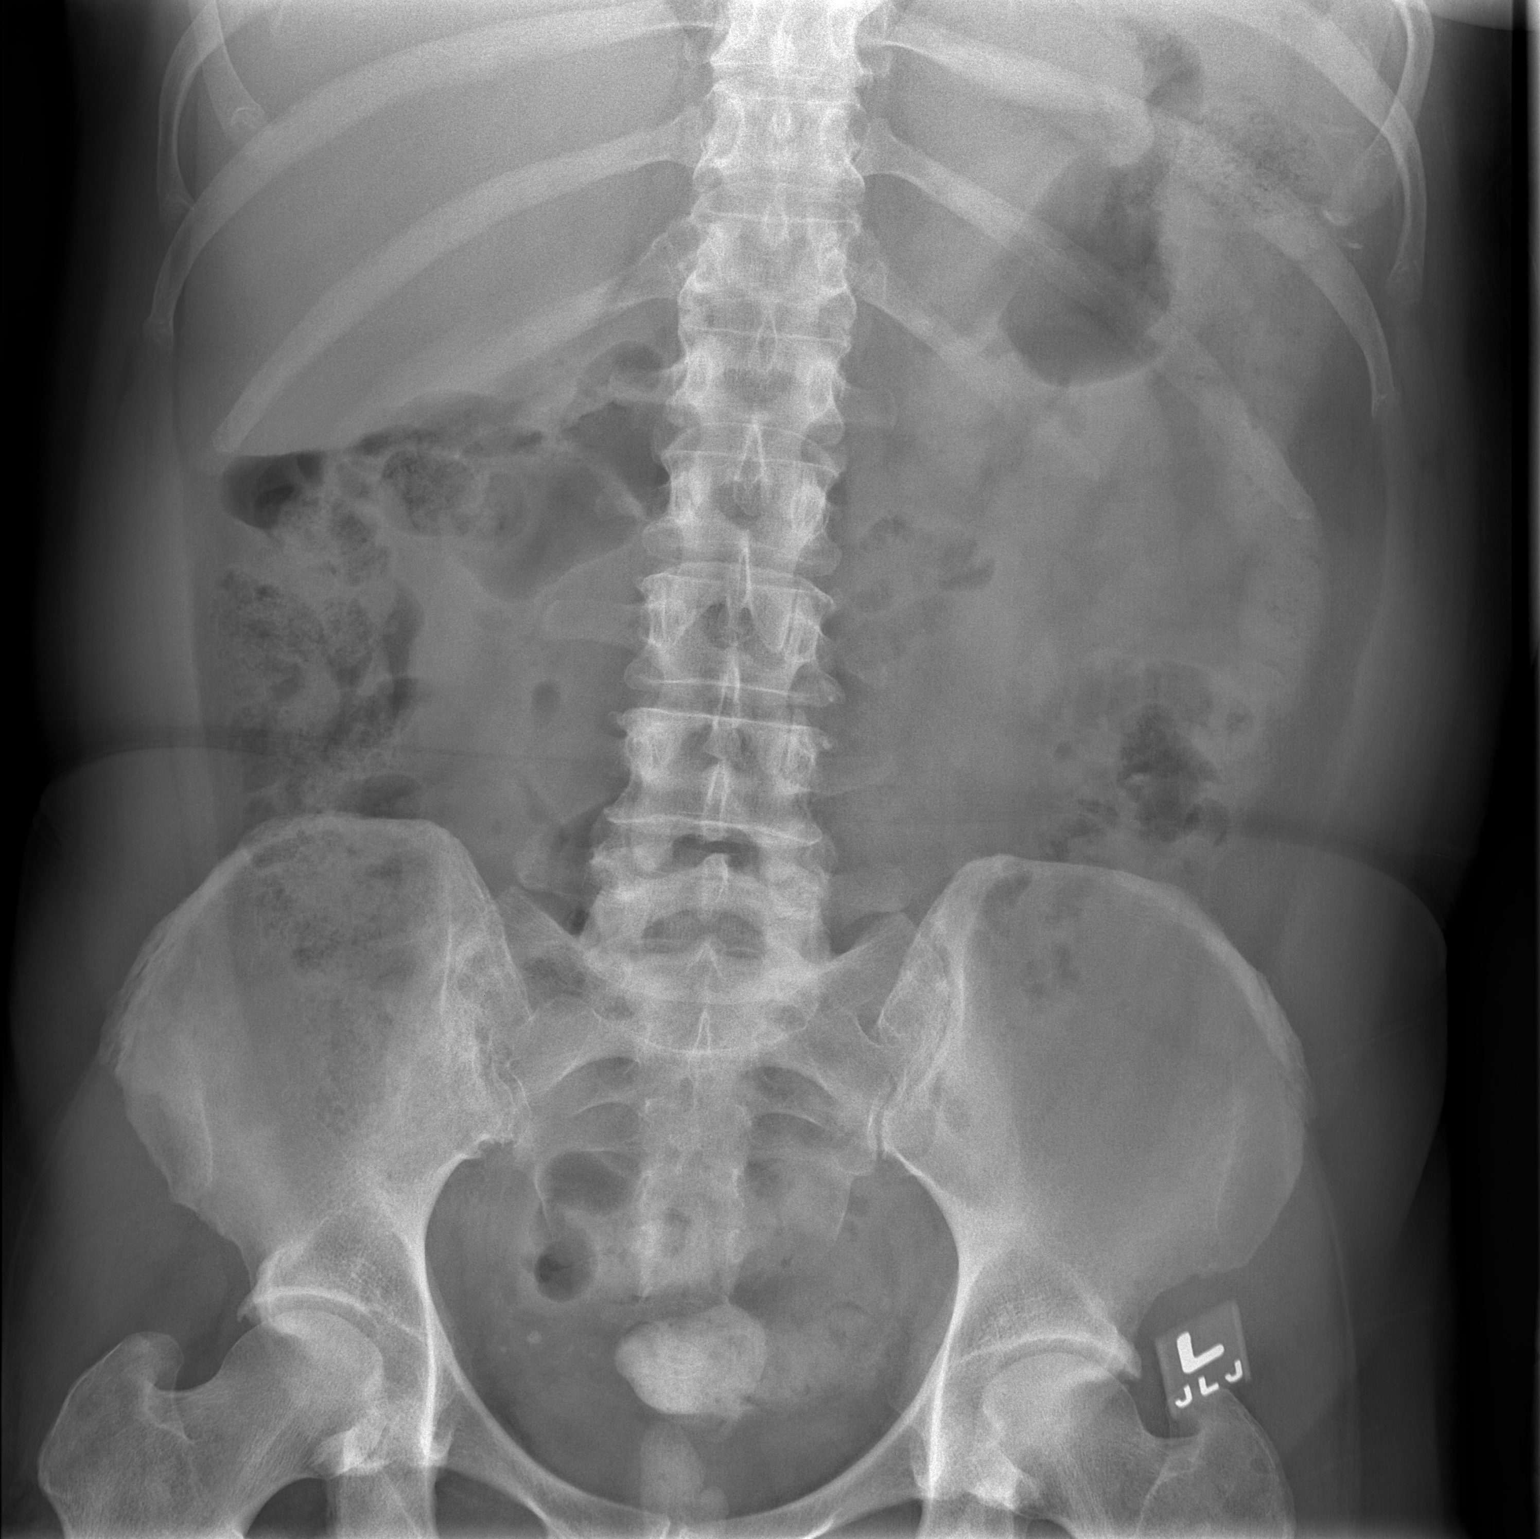

[2 of 2 positions shown; findings below may reference images not displayed]

FINDINGS: No free intraperitoneal air is identified.  Bowel gas
pattern is normal.  No unexpected calcification.
IMPRESSION: Negative exam.

## 2010-01-24 IMAGING — CT CT CHEST W/ CM
2 of 4 series · 15 of 36 positions shown, 18 images · IV contrast (agent unspecified)
Comparison: Plain film chest [DATE].

CLINICAL DATA: Abdominal pain.  Possible lung nodule on chest film.

CT CHEST WITH CONTRAST
TECHNIQUE: Multidetector CT imaging of the chest was performed
following the standard protocol during bolus administration of
intravenous contrast.
Contrast: 80 ml [0C].

[Series 2: rtn chest with st · axial · 0.69mm/px · z∈[-298,-48]mm · 12 of 60 slices shown, 15 images]
[im 5/60  mediastinal]
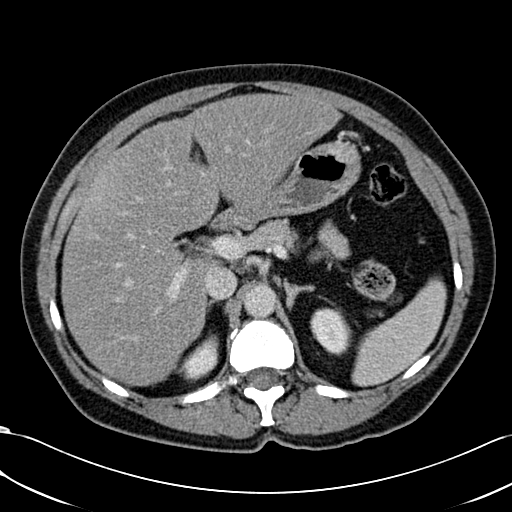
[im 5/60  lung]
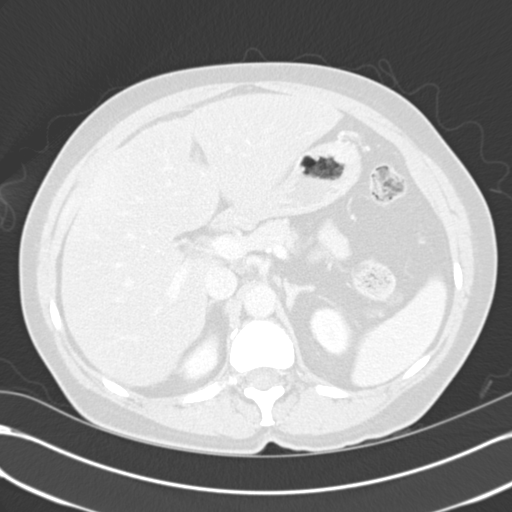
[im 9/60  lung]
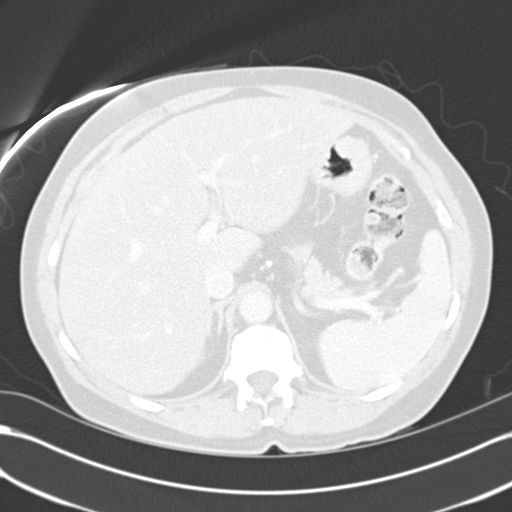
[im 13/60  lung]
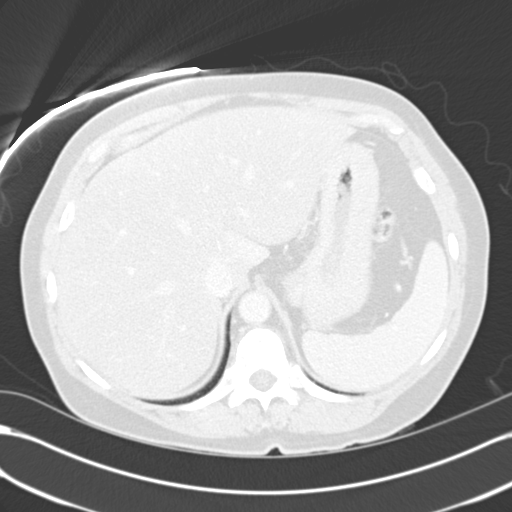
[im 17/60  lung]
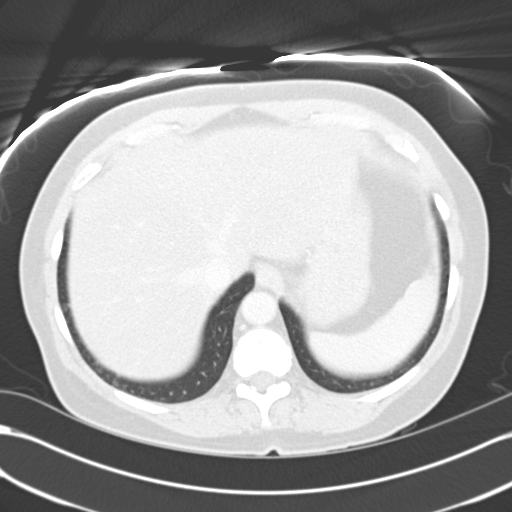
[im 22/60  mediastinal]
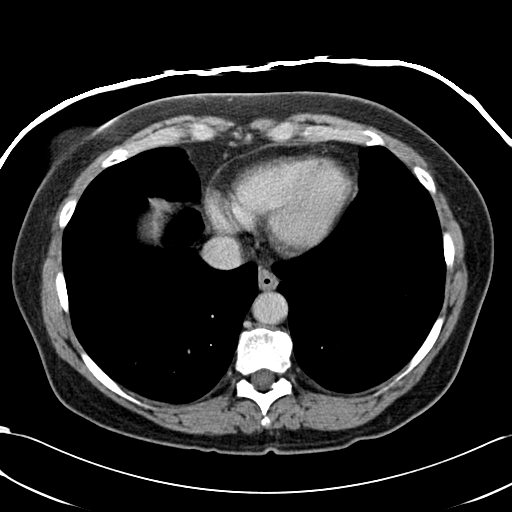
[im 22/60  lung]
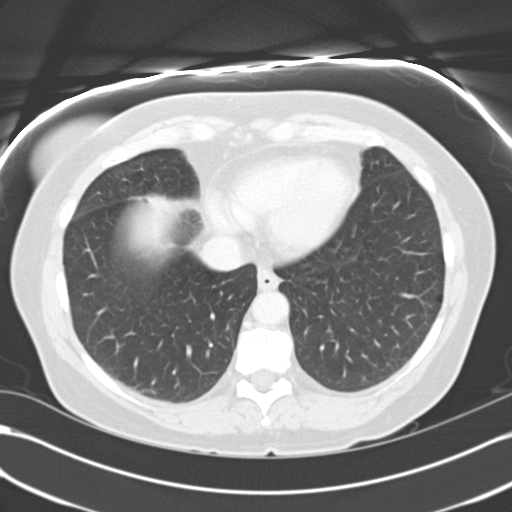
[im 26/60  lung]
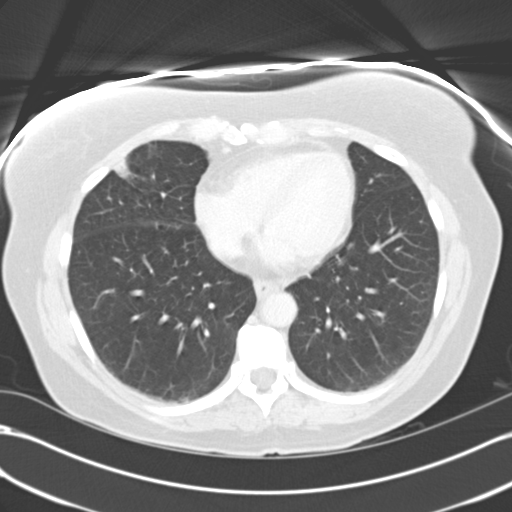
[im 34/60  lung]
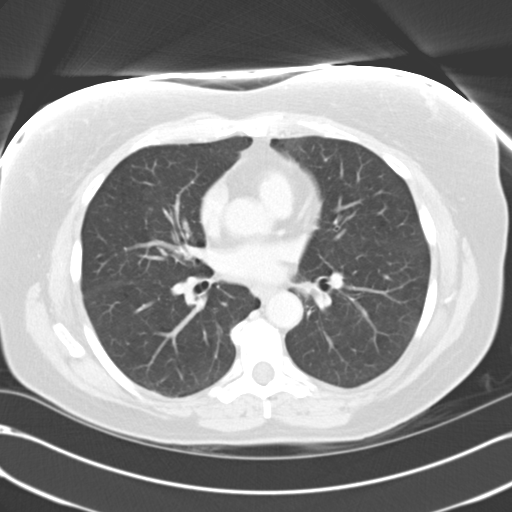
[im 38/60  lung]
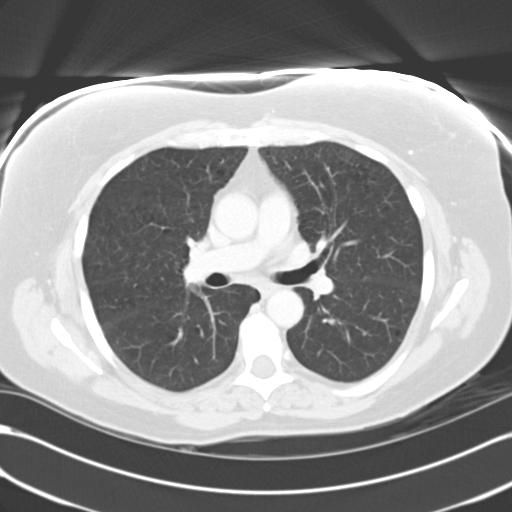
[im 43/60  mediastinal]
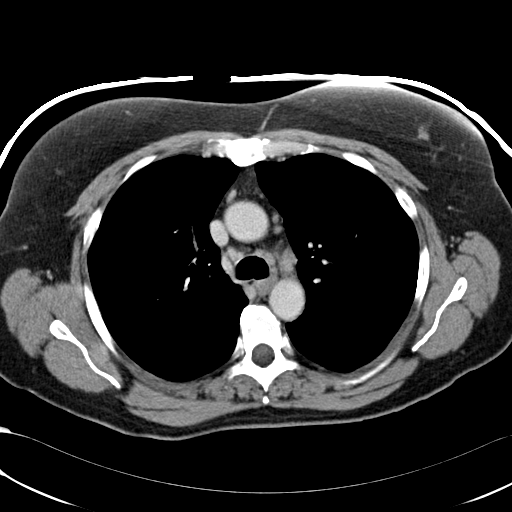
[im 43/60  lung]
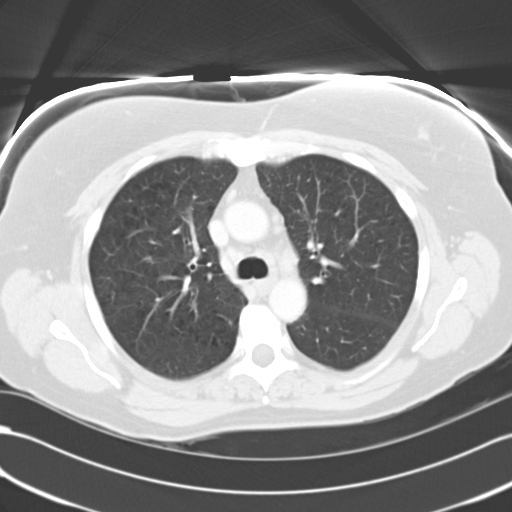
[im 47/60  lung]
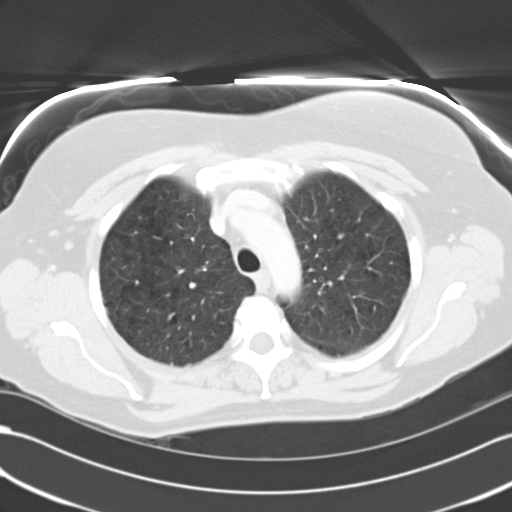
[im 51/60  lung]
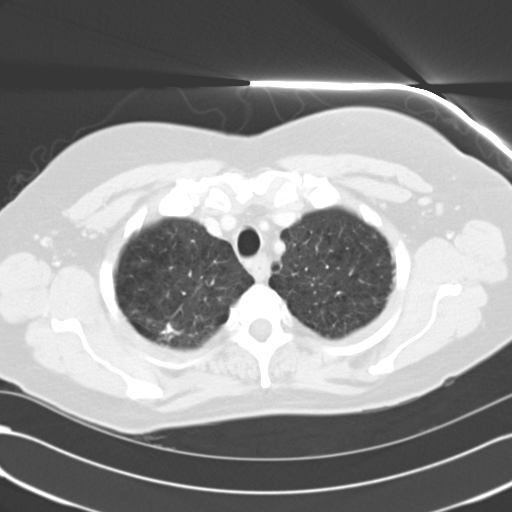
[im 55/60  lung]
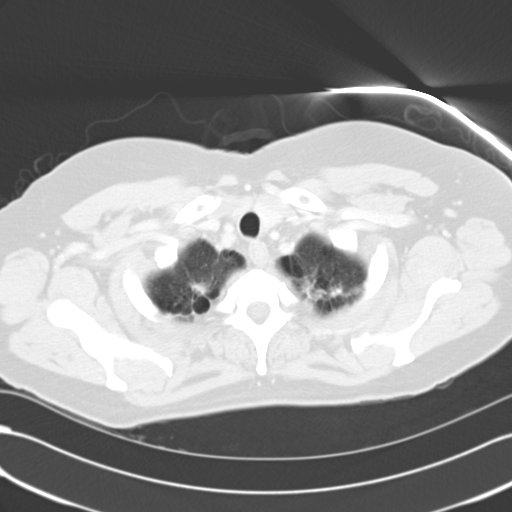

[Series 602: cor · coronal · 0.69mm/px · 3 of 116 slices shown]
[im 24/116  lung]
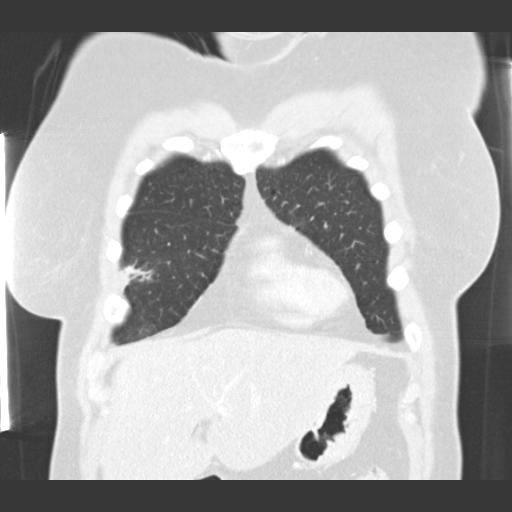
[im 47/116  lung]
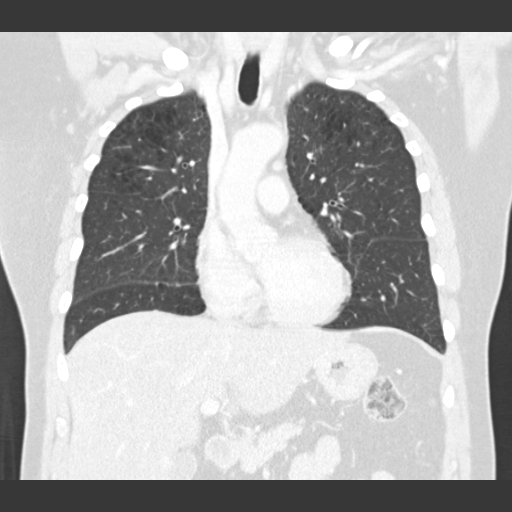
[im 70/116  lung]
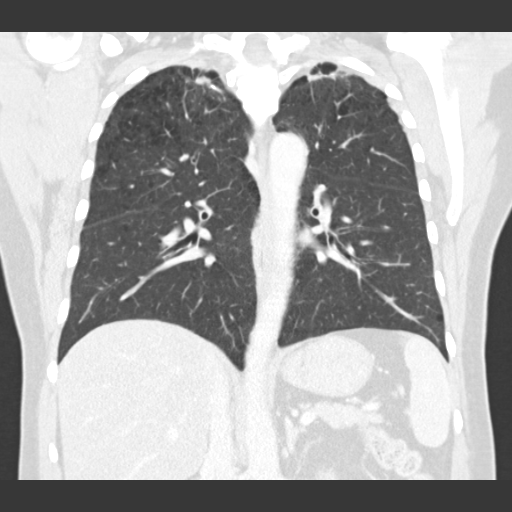

[15 of 36 positions shown; findings below may reference images not displayed]

FINDINGS: There is no axillary, hilar or mediastinal
lymphadenopathy.  No pleural or pericardial effusion is identified.
Heart size is normal.  Lungs demonstrate changes of centrilobular
emphysema.  There is a of airspace opacity in the right middle lobe
corresponding with finding on plain film of the chest.  Also seen
is some biapical pleural and parenchymal scarring.  Lungs otherwise
unremarkable.

Incidentally imaged upper abdomen demonstrates low attenuation of
the liver consistent with fatty change.  No focal liver lesion.
Visualized intra-abdominal contents are otherwise unremarkable.  No
focal bony abnormality.
IMPRESSION: 1.  Airspace opacity right middle lobe corresponds with finding on
plain film chest.  The appearance is most suggestive of infectious
process including atypical infection such as TIGER.
2.  Centrilobular emphysema.

## 2010-01-24 IMAGING — CR DG CHEST 2V
2 series · 2 of 2 positions shown · non-contrast
Comparison: None.

CLINICAL DATA: Shortness of breath and cough.

CHEST - 2 VIEW

[w chest pa]
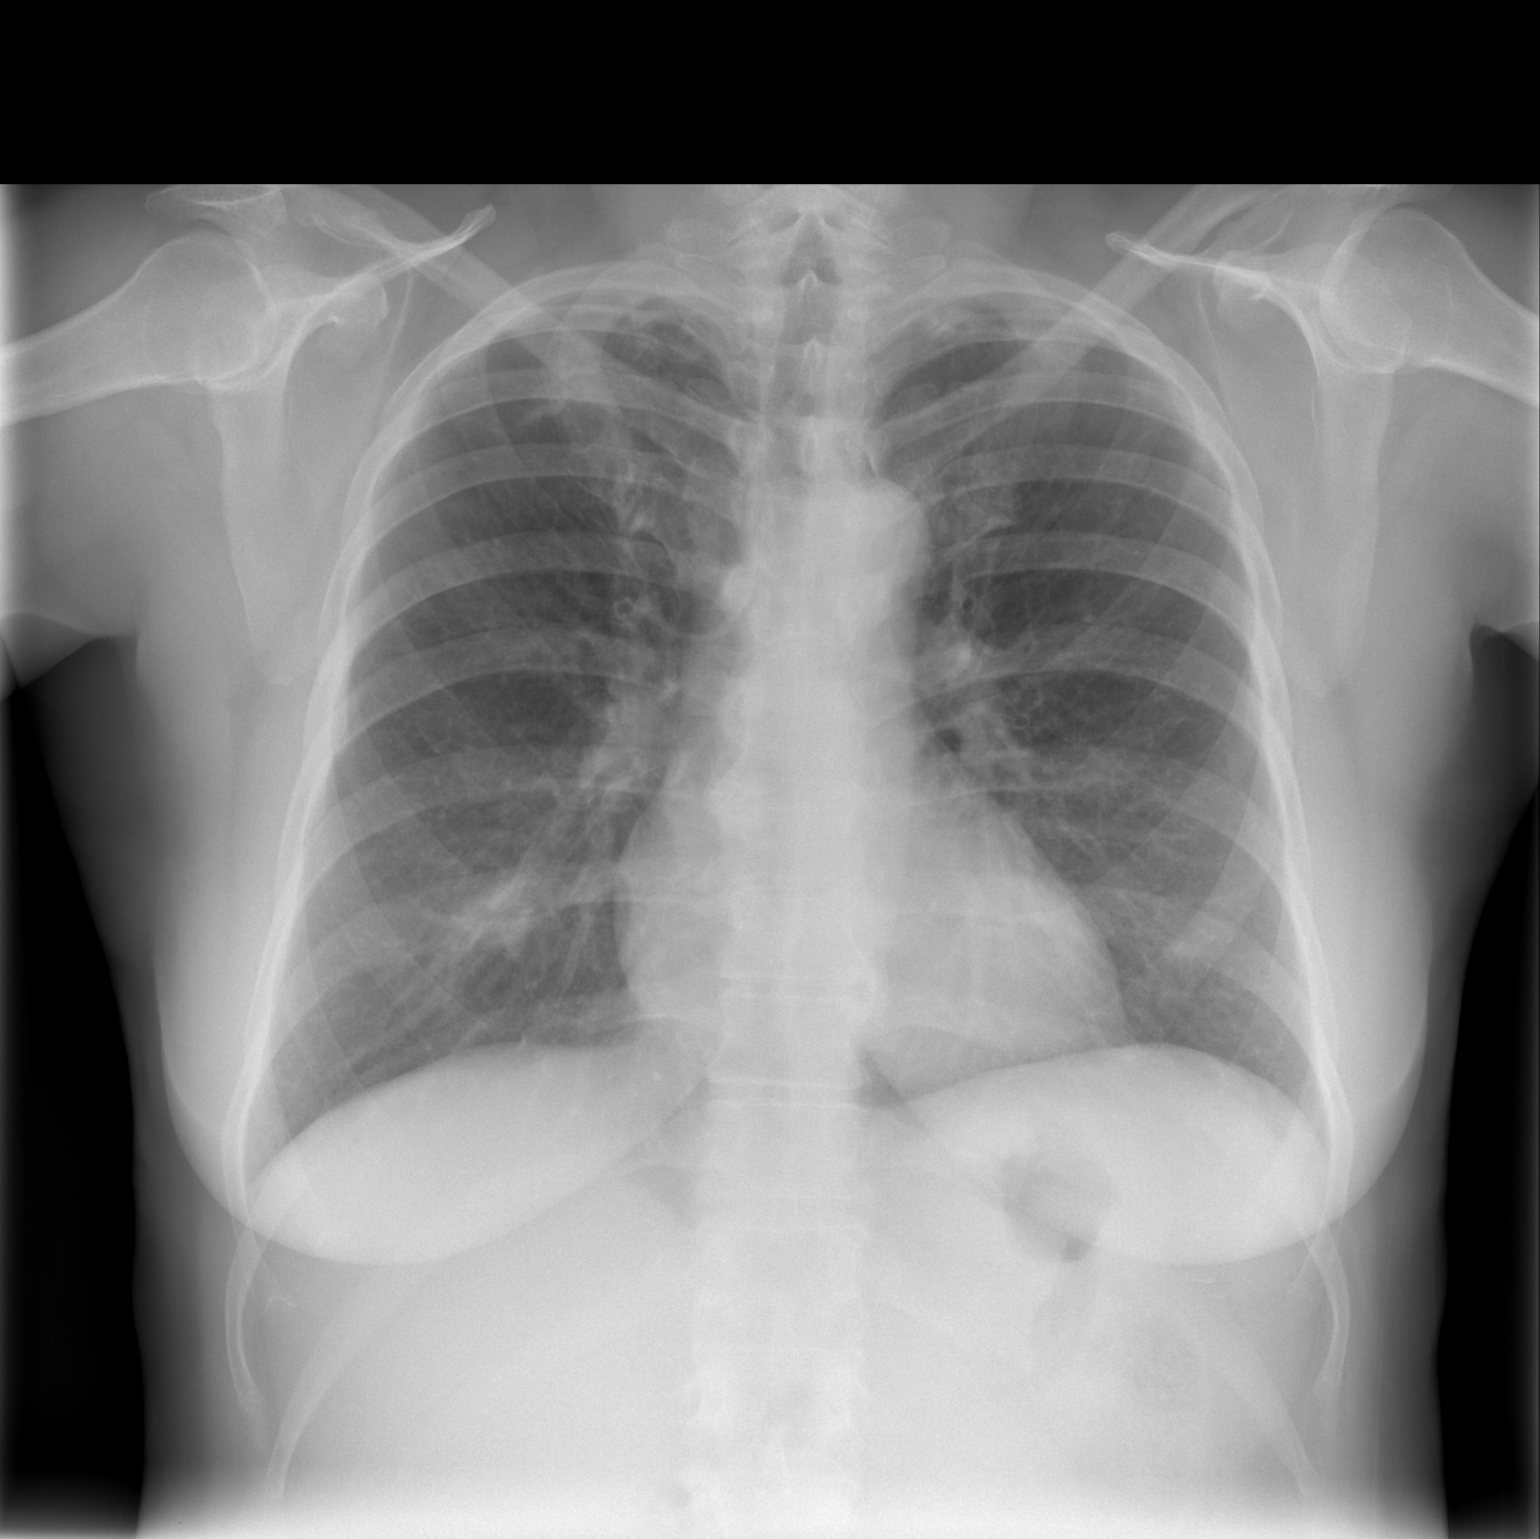

[w chest lat]
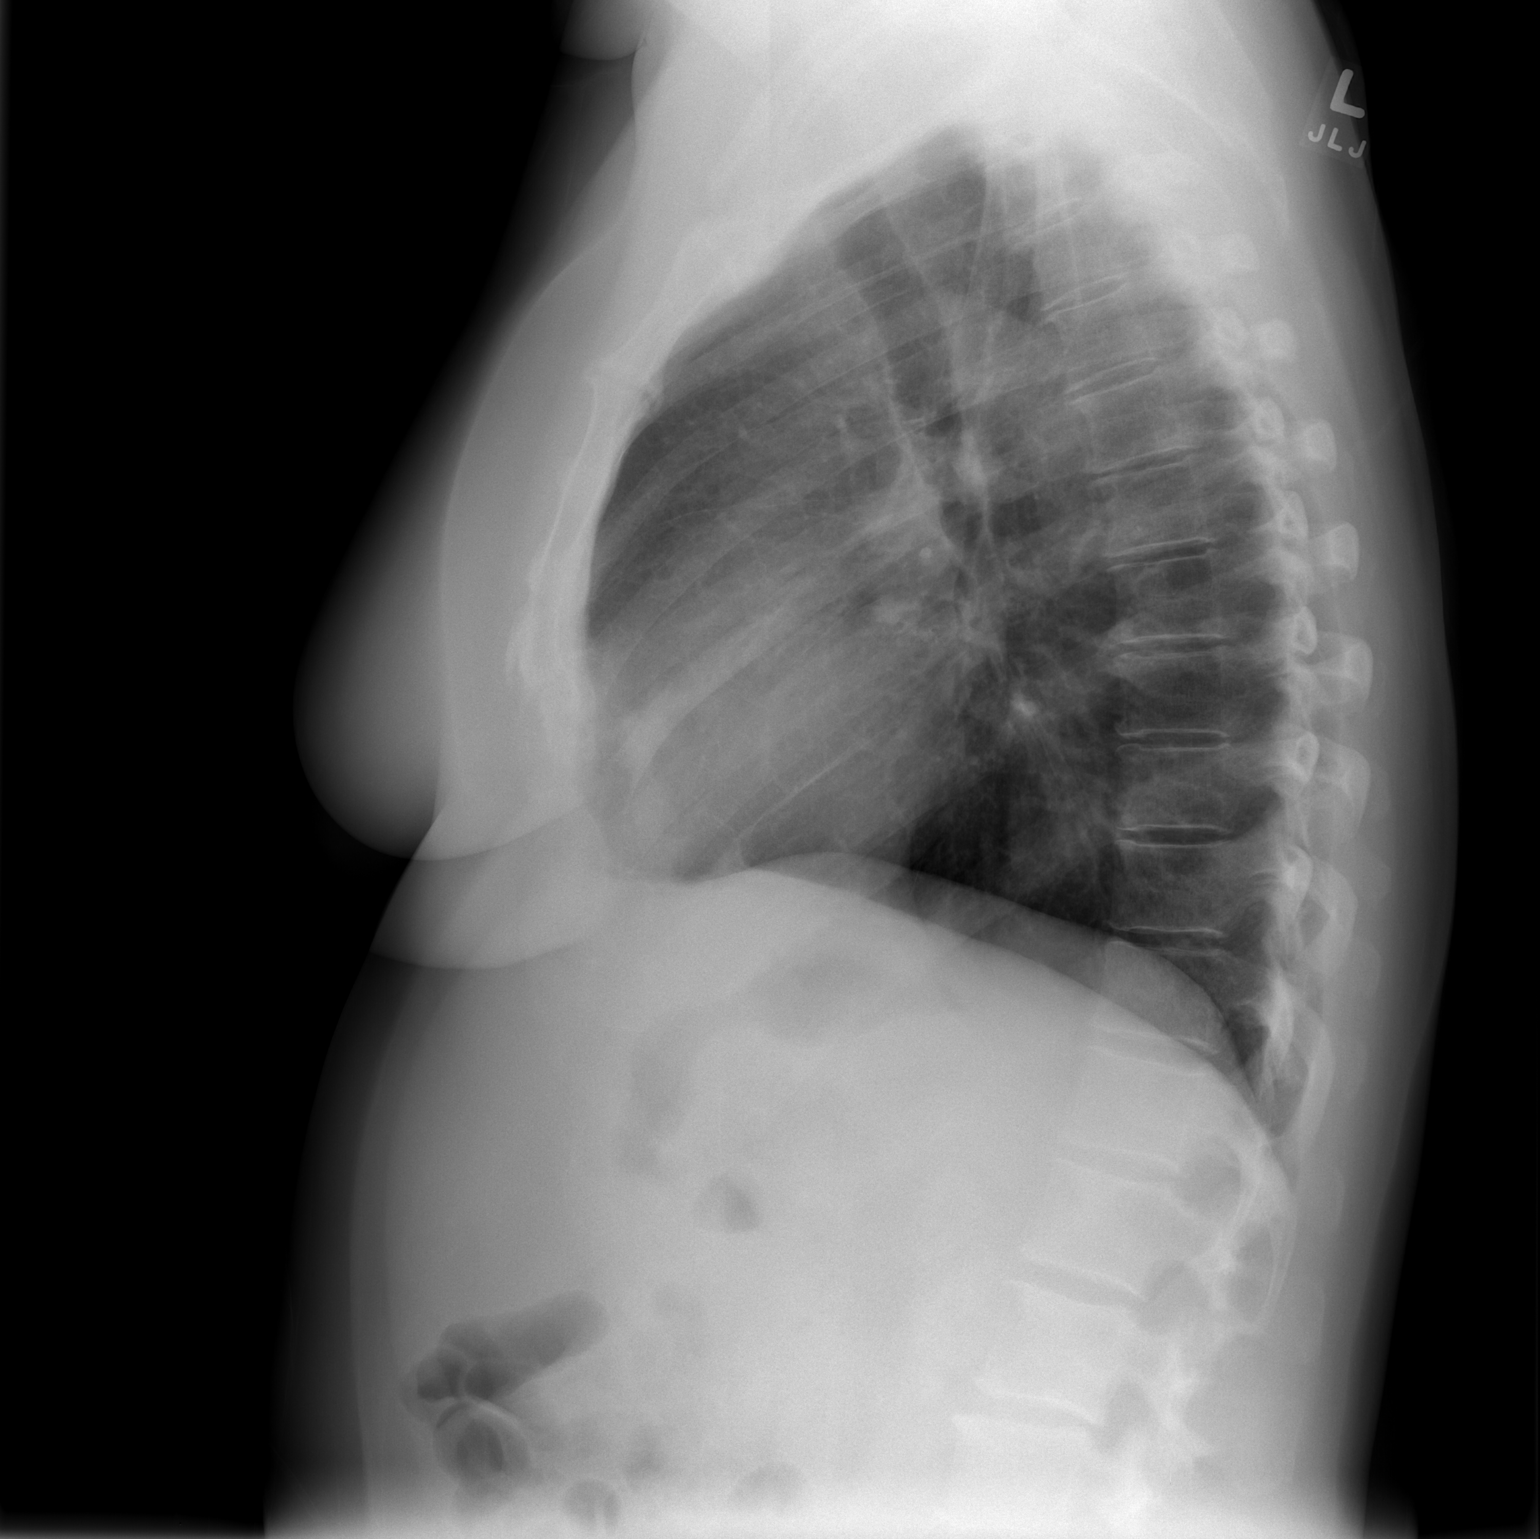

[2 of 2 positions shown; findings below may reference images not displayed]

FINDINGS: There is a focal nodular opacity in the right middle lobe
measuring approximately 1.5 cm.  Lungs otherwise appear clear.
Heart size is normal.
IMPRESSION: Nodular opacity right middle lobe.  Chest CT recommended for
further evaluation.

## 2010-01-28 LAB — COMPREHENSIVE METABOLIC PANEL
ALT: 20 U/L (ref 0–35)
AST: 27 U/L (ref 0–37)
Albumin: 3.7 g/dL (ref 3.5–5.2)
Alkaline Phosphatase: 69 U/L (ref 39–117)
BUN: 7 mg/dL (ref 6–23)
CO2: 27 mEq/L (ref 19–32)
Calcium: 9.4 mg/dL (ref 8.4–10.5)
Chloride: 103 mEq/L (ref 96–112)
Creatinine, Ser: 0.72 mg/dL (ref 0.4–1.2)
GFR calc Af Amer: >60 mL/min (ref >60)
GFR calc non Af Amer: >60 mL/min (ref >60)
Glucose, Bld: 123 mg/dL — ABNORMAL HIGH (ref 70–99)
Potassium: 3.9 mEq/L (ref 3.5–5.1)
Sodium: 139 mEq/L (ref 135–145)
Total Bilirubin: 0.5 mg/dL (ref 0.3–1.2)
Total Protein: 7.2 g/dL (ref 6.0–8.3)

## 2010-01-28 LAB — DIFFERENTIAL
Basophils Absolute: 0.1 10*3/uL (ref 0.0–0.1)
Basophils Relative: 1 % (ref 0–1)
Eosinophils Absolute: 0.1 10*3/uL (ref 0.0–0.7)
Eosinophils Relative: 1 % (ref 0–5)
Lymphocytes Relative: 27 % (ref 12–46)
Lymphs Abs: 3.3 10*3/uL (ref 0.7–4.0)
Monocytes Absolute: 0.8 10*3/uL (ref 0.1–1.0)
Monocytes Relative: 7 % (ref 3–12)
Neutro Abs: 7.9 10*3/uL — ABNORMAL HIGH (ref 1.7–7.7)
Neutrophils Relative %: 65 % (ref 43–77)

## 2010-01-28 LAB — URINALYSIS, ROUTINE W REFLEX MICROSCOPIC
Bilirubin Urine: NEGATIVE
Hgb urine dipstick: NEGATIVE
Ketones, ur: NEGATIVE mg/dL
Nitrite: NEGATIVE
Protein, ur: NEGATIVE mg/dL
Specific Gravity, Urine: 1.007 (ref 1.005–1.030)
Urine Glucose, Fasting: NEGATIVE mg/dL
Urobilinogen, UA: 0.2 mg/dL (ref 0.0–1.0)
pH: 6.5 (ref 5.0–8.0)

## 2010-01-28 LAB — CBC
HCT: 37.7 % (ref 36.0–46.0)
Hemoglobin: 12.6 g/dL (ref 12.0–15.0)
MCH: 31.3 pg (ref 26.0–34.0)
MCHC: 33.4 g/dL (ref 30.0–36.0)
MCV: 93.8 fL (ref 78.0–100.0)
Platelets: 309 10*3/uL (ref 150–400)
RBC: 4.02 MIL/uL (ref 3.87–5.11)
RDW: 13 % (ref 11.5–15.5)
WBC: 12.3 10*3/uL — ABNORMAL HIGH (ref 4.0–10.5)

## 2010-01-28 LAB — LIPASE, BLOOD: Lipase: 34 U/L (ref 11–59)

## 2010-02-03 ENCOUNTER — Encounter: Payer: Self-pay | Admitting: Gynecology

## 2010-02-13 ENCOUNTER — Encounter: Payer: Self-pay | Admitting: Emergency Medicine

## 2010-03-25 ENCOUNTER — Other Ambulatory Visit: Payer: Self-pay | Admitting: Gynecology

## 2010-03-25 ENCOUNTER — Ambulatory Visit
Admission: RE | Admit: 2010-03-25 | Discharge: 2010-03-25 | Disposition: A | Discharge status: Home / Self Care | Payer: Medicare Other | Source: Ambulatory Visit | Attending: Gynecology | Admitting: Gynecology

## 2010-03-25 DIAGNOSIS — N6320 Unspecified lump in the left breast, unspecified quadrant: Secondary | ICD-10-CM

## 2010-03-28 ENCOUNTER — Inpatient Hospital Stay: Admission: RE | Admit: 2010-03-28 | Payer: Self-pay | Source: Ambulatory Visit

## 2010-05-31 NOTE — Discharge Summary (Signed)
[place]. [facility]  Patient:    Beth Stanley, Beth Stanley Visit Number: [id number] MRN: [id number]          Service Type: MED Location: 2000 2015 01 Attending Physician:  Rollene Rotunda Dictated by:   Joellyn Rued, P.A.-C. Admit Date:  10/26/2000 Disc. Date: 10/28/00   CC:         Claretta Fraise, M.D. [facility]   Referring Physician Discharge Summa  DATE OF BIRTH:  March 30, 1950  SUMMARY OF HISTORY:  Ms. Beth Stanley is a 60 year old white female who presented to the emergency room on the evening of October 14 complaining of jaw discomfort that began around 9 p.m., radiating to her right upper arm and into chest cramping.  She gave this a 5 on a scale of 0-10, not associated with nausea, vomiting, diaphoresis, or shortness of breath.  She took her sisters sublingual nitroglycerin which immediately relieved the discomfort; thus, she came to the emergency room.  She has had mild chest and neck discomfort while here in the hospital.  She had a catheterization by Dr. Donnie Aho approximately four years ago and stated that she did not have any blockages.  Procedure was followed by an exercise stress test - unknown results, and patient requests [facility].  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  She has history of high blood pressure which is not treated.  She has a history of a spastic colon, hiatal hernia, and anxiety.  She denies any hyperlipidemia, diabetes, bleeding dyscrasias, thyroid dysfunction.  Surgical history is notable for a hysterectomy.  She states she gets stomach cramping with aspirin.  Prior to admission she was on Zoloft 50 mg q.d., hormone patch, and Prilosec.  She smokes approximately one pack a day and has been doing so for 34 years.  LABORATORY DATA:  Serial of three CK isoenzymes and three troponin enzymes were negative for myocardial infarction.  Admission H&H was 13.5 and  39.6, normal indices, platelets 308, wbcs 9.8.  PT 13.4.  Cholesterol 159, triglycerides 181, HDL 50, LDL 73, with a cholesterol/HDL ratio of 3.2 (fasting).  TSH 1.232.  Sodium 140, potassium 4.7, BUN 9, creatinine 0.8, glucose 93.  EKG showed normal sinus rhythm, nonspecific ST-T wave changes.  Chest x-ray did not show any active disease.  HOSPITAL COURSE:  Ms. Beth Stanley was admitted to the unit 2000 at [facility].  Enzymes and EKGs ruled her out for myocardial infarction.  Blood pressure on October 15 was 150/100 and she was started on Altace 5 mg p.o. q.d. and her home medications were continued.  On October 16 she underwent cardiac catheterization by Dr. Chales Abrahams.  According to his progress note, she had very mild coronary artery disease with normal LV function; medical management and recommended continue Prilosec for at least six weeks, and blood pressure control with ACE inhibitor.  Recommended follow-up appointment with internal medicine for blood pressure control, smoking cessation, and overall health. It was noted that on physical exam, in the right supraclavian area, she does have some swelling but it does not appear to be involved in the lymph nodes nor does she have any nodules or tenderness or thyromegaly.  DISCHARGE DIAGNOSES: 1. Noncardiac discomfort of unknown etiology. 2. Hypertension. 3. Nonobstructive trivial coronary artery disease as described. 4. History of hiatal hernia. 5. Tobacco use. 6. Anxiety/stress.  DISPOSITION:  She is discharged home.  MEDICATIONS:  Asked to continue: 1. Zoloft 50 mg q.d. 2. Vivelle  patches as previously. 3. Prilosec 20 mg q.d. 4. She was given a prescription for Altace 5 mg q.d.  ACTIVITY:  She was advised no driving, sexual activity, or heavy exertion for two days; she was told no lifting for one week.  WOUND CARE:  She may shower on October 17 and remove her Perclose dressing.  DIET:  She was asked to maintain a low  salt diet.  SPECIAL INSTRUCTIONS: 1. If she had any problems with her catheterization site she was asked to call    the cardiology office immediately for problems. 2. She was advised no smoking or tobacco products.  FOLLOW-UP:  She will have a groin check with the P.A. on Thursday, October 24 at 11 a.m. at [facility].  She will follow up and initiate care with Dr. Threasa Alpha on Stanley 27 at 10 a.m.  In the meantime, she was asked to record blood pressures - write down the date, time, and her blood pressure and bring these to all office appointments. Dictated by:   Joellyn Rued, P.A.-C. Attending Physician:  Rollene Rotunda DD:  10/28/00 TD:  10/28/00 Job: [id number]

## 2010-05-31 NOTE — Cardiovascular Report (Signed)
[place]. [facility]  Patient:    Beth Stanley, Beth Stanley Visit Number: [id number] MRN: [id number]          Service Type: MED Location: 2000 2015 01 Attending Physician:  Rollene Rotunda Dictated by:   Veneda Melter, M.D. Proc. Date: 10/28/00 Admit Date:  10/26/2000   CC:         Rollene Rotunda, M.D. [facility]   Cardiac Catheterization  PROCEDURES PERFORMED: 1. Left heart catheterization. 2. Left ventriculogram. 3. Selective coronary angiography. 4. Perclose right femoral artery.  DIAGNOSES: 1. Mild coronary artery disease by angiogram. 2. Normal left ventricular systolic function.  INDICATIONS: The patient is a 60 year old, white female with multiple cardiac risk factors, who presents with substernal chest discomfort. The patient was admitted to the hospital and subsequently ruled out for acute myocardial infarction. She presents now for further assessment.  TECHNIQUE: After informed consent was obtained, the patient was brought to the cardiac catheterization lab where a 6 French sheath was placed in the right femoral artery. Left heart catheterization, left ventriculogram, and selective coronary angiography were then performed in the usual fashion using preformed 6 French Judkins catheters.  At the termination of the case, a Perclose suture closure device was deployed to the right femoral artery until adequate hemostasis was achieved.  The patient tolerated the procedure well and was transferred to the ward in stable condition.  FINDINGS: Findings are as follows: 1. Left main trunk: The left main trunk is a small caliber vessel with mild    irregularities of 30%. 2. LAD: This is a medium caliber vessel that provides a first diagonal branch    in the mid section. There is mild disease at the ostium in the proximal    segment of the left anterior descending of not greater than 30%. 3. Left circumflex artery: This is a medium caliber vessel that consists  of a    large first marginal branch in the mid section. The left circumflex system    has mild irregularities. 4. Right coronary: Dominant. This is a medium caliber vessel that provides the    posterior descending artery and several small posterior ventricular    branches in its terminal segment. There are mild irregularities in the    right coronary system.  LEFT VENTRICULOGRAM: Normal end-systolic and end-diastolic dimensions. Overall left ventricular function is well preserved, ejection fraction of greater than 60%.  No mitral regurgitation. LV pressure is 140/10, aortic  s 140/85, LVEDP equals 14.  ASSESSMENT AND PLAN: The patient is a 60 year old female with mild coronary artery disease by angiogram and normal left ventricular function. Continued medical therapy of her coronary disease will be pursued. Her antihypertensives will be advanced and the patient assessed for other causes of chest pain. Dictated by:   Veneda Melter, M.D. Attending Physician:  Rollene Rotunda DD:  10/28/00 TD:  10/28/00 Job: [id number]

## 2011-03-03 ENCOUNTER — Other Ambulatory Visit: Payer: Self-pay | Admitting: Gynecology

## 2011-03-03 DIAGNOSIS — Z1231 Encounter for screening mammogram for malignant neoplasm of breast: Secondary | ICD-10-CM

## 2011-03-04 ENCOUNTER — Other Ambulatory Visit: Payer: Self-pay | Admitting: Gynecology

## 2011-04-01 ENCOUNTER — Ambulatory Visit
Admission: RE | Admit: 2011-04-01 | Discharge: 2011-04-01 | Disposition: A | Discharge status: Home / Self Care | Payer: Medicare Other | Source: Ambulatory Visit | Attending: Gynecology | Admitting: Gynecology

## 2011-04-01 DIAGNOSIS — Z1231 Encounter for screening mammogram for malignant neoplasm of breast: Secondary | ICD-10-CM

## 2011-08-13 ENCOUNTER — Telehealth: Payer: Self-pay

## 2011-08-13 NOTE — Telephone Encounter (Signed)
PT WOULD LIKE TO CHECK AND SEE WHAT HER BLOOD SUGAR IS. SHE HASN'T BEEN KEEPING A RECORD OF WHAT IT IS, BUT WOULD LIKE TO KNOW THE NUMBERS SHE HAS HAD IN THE PAST PLEASE CALL [phone]

## 2011-08-15 NOTE — Telephone Encounter (Signed)
She had labs checked at Dr Lowell Guitar office and wants to know if her Metformin needs to be adjusted based on these labs, they were faxed to [place] last week, are they in medical records?

## 2011-08-18 NOTE — Telephone Encounter (Signed)
No labs

## 2011-08-18 NOTE — Telephone Encounter (Signed)
I have called patient, and she is not at number given, will try again later.

## 2011-08-18 NOTE — Telephone Encounter (Signed)
She is going to get the labs that were done at Dr Lowell Guitar office at [facility] and bring them to me.

## 2011-08-18 NOTE — Telephone Encounter (Signed)
Left another message for her to call me back and advise we have no labs

## 2011-10-20 ENCOUNTER — Other Ambulatory Visit: Payer: Self-pay | Admitting: Radiology

## 2011-10-22 ENCOUNTER — Other Ambulatory Visit: Payer: Self-pay | Admitting: *Deleted

## 2012-02-03 ENCOUNTER — Encounter: Payer: Self-pay | Admitting: Emergency Medicine

## 2012-02-03 ENCOUNTER — Other Ambulatory Visit: Payer: Self-pay | Admitting: Emergency Medicine

## 2012-02-03 ENCOUNTER — Ambulatory Visit (INDEPENDENT_AMBULATORY_CARE_PROVIDER_SITE_OTHER): Payer: Medicare Other | Admitting: Emergency Medicine

## 2012-02-03 VITALS — BP 106/67 | HR 82 | Temp 98.1°F | Resp 16 | Ht 63.0 in | Wt 149.0 lb

## 2012-02-03 DIAGNOSIS — K219 Gastro-esophageal reflux disease without esophagitis: Secondary | ICD-10-CM | POA: Insufficient documentation

## 2012-02-03 DIAGNOSIS — F329 Major depressive disorder, single episode, unspecified: Secondary | ICD-10-CM

## 2012-02-03 DIAGNOSIS — C8590 Non-Hodgkin lymphoma, unspecified, unspecified site: Secondary | ICD-10-CM | POA: Insufficient documentation

## 2012-02-03 DIAGNOSIS — G629 Polyneuropathy, unspecified: Secondary | ICD-10-CM

## 2012-02-03 DIAGNOSIS — IMO0001 Reserved for inherently not codable concepts without codable children: Secondary | ICD-10-CM

## 2012-02-03 DIAGNOSIS — E785 Hyperlipidemia, unspecified: Secondary | ICD-10-CM | POA: Insufficient documentation

## 2012-02-03 DIAGNOSIS — E119 Type 2 diabetes mellitus without complications: Secondary | ICD-10-CM

## 2012-02-03 DIAGNOSIS — F32A Depression, unspecified: Secondary | ICD-10-CM

## 2012-02-03 DIAGNOSIS — I10 Essential (primary) hypertension: Secondary | ICD-10-CM | POA: Insufficient documentation

## 2012-02-03 DIAGNOSIS — D7589 Other specified diseases of blood and blood-forming organs: Secondary | ICD-10-CM

## 2012-02-03 LAB — CBC WITH DIFFERENTIAL/PLATELET
Basophils Relative: 1 % (ref 0–1)
Eosinophils Absolute: 0.2 10*3/uL (ref 0.0–0.7)
Eosinophils Relative: 2 % (ref 0–5)
HCT: 39 % (ref 36.0–46.0)
Hemoglobin: 13.5 g/dL (ref 12.0–15.0)
Lymphs Abs: 3.5 10*3/uL (ref 0.7–4.0)
MCH: 30.7 pg (ref 26.0–34.0)
MCHC: 34.6 g/dL (ref 30.0–36.0)
MCV: 88.6 fL (ref 78.0–100.0)
Monocytes Absolute: 0.7 10*3/uL (ref 0.1–1.0)
Monocytes Relative: 8 % (ref 3–12)
RBC: 4.4 MIL/uL (ref 3.87–5.11)

## 2012-02-03 LAB — TSH: TSH: 0.681 u[IU]/mL (ref 0.350–4.500)

## 2012-02-03 LAB — COMPREHENSIVE METABOLIC PANEL
Alkaline Phosphatase: 61 U/L (ref 39–117)
BUN: 7 mg/dL (ref 6–23)
CO2: 27 mEq/L (ref 19–32)
Glucose, Bld: 108 mg/dL — ABNORMAL HIGH (ref 70–99)
Total Bilirubin: 0.3 mg/dL (ref 0.3–1.2)
Total Protein: 7.1 g/dL (ref 6.0–8.3)

## 2012-02-03 LAB — LIPID PANEL
Cholesterol: 153 mg/dL (ref 0–200)
Total CHOL/HDL Ratio: 3.7 Ratio
Triglycerides: 346 mg/dL — ABNORMAL HIGH (ref <150)
VLDL: 69 mg/dL — ABNORMAL HIGH (ref 0–40)

## 2012-02-03 LAB — GLUCOSE, POCT (MANUAL RESULT ENTRY): POC Glucose: 110 mg/dl — AB (ref 70–99)

## 2012-02-03 MED ORDER — RAMIPRIL 5 MG PO TABS: 5.0000 mg | ORAL_TABLET | Freq: Every day | ORAL | Status: DC

## 2012-02-03 MED ORDER — OMEPRAZOLE 40 MG PO CPDR: 40.0000 mg | DELAYED_RELEASE_CAPSULE | Freq: Every day | ORAL | Status: DC

## 2012-02-03 MED ORDER — GABAPENTIN 300 MG PO CAPS: ORAL_CAPSULE | ORAL | Status: DC

## 2012-02-03 MED ORDER — SERTRALINE HCL 100 MG PO TABS: 100.0000 mg | ORAL_TABLET | Freq: Every day | ORAL | Status: DC

## 2012-02-03 MED ORDER — METFORMIN HCL 500 MG PO TABS: 500.0000 mg | ORAL_TABLET | Freq: Two times a day (BID) | ORAL | Status: DC

## 2012-02-03 NOTE — Progress Notes (Signed)
  Subjective:    Patient ID: Beth Stanley, female    DOB: 04-Feb-1950, 62 y.o.   MRN: [id number]  HPI patient enters for followup of #1 her diabetes. She watches her diet does not exercise much is currently taking Glucophage twice a day. Problem #2 reflux with symptoms controlled with Prilosec. #3 hypertension currently under treatment with Altace 5 mg a day. Problem #4 depression. 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 Problem #5 lesion right leg. She is a red scaly area on her right leg she would like checked.    Review of Systems     Objective:   Physical Exam HEENT exam is unremarkable. Neck is supple chest clear heart regular rate no murmurs abdomen soft nontender extremity exam reveals no edema sensation intact pulses 2+ and symmetrical. Over the anterior portion of the right lower leg is a 3 x 3 mm red scaly nodule  Results for orders placed in visit on 02/03/12  GLUCOSE, POCT (MANUAL RESULT ENTRY)      Component Value Range   POC Glucose 110 (*) 70 - 99 mg/dl  POCT GLYCOSYLATED HEMOGLOBIN (HGB A1C)      Component Value Range   Hemoglobin A1C 5.9          Assthessment & Plan:    Diabetes is at goal. Statin intolerant. She will make appt to see derm.

## 2012-02-05 ENCOUNTER — Telehealth: Payer: Self-pay

## 2012-02-05 NOTE — Telephone Encounter (Signed)
Please advise. Do you want to change back to 20 mg?

## 2012-02-05 NOTE — Telephone Encounter (Signed)
ROBERT STATES [facility] USUALLY GET 20MG  OF PRILOSEC BUT THE LAST TIME SHE WAS GIVEN 40MG  INSTEAD, DIDN'T KNOW IF THEY SHOULD TAKE THE 40MG  OR NOT THE 20 IS EFFECTIVE. PLEASE CALL [phone]

## 2012-02-05 NOTE — Telephone Encounter (Signed)
Please add husbands cell phone to call back  [phone]

## 2012-02-06 ENCOUNTER — Telehealth: Payer: Self-pay

## 2012-02-06 MED ORDER — OMEPRAZOLE 20 MG PO CPDR: 20.0000 mg | DELAYED_RELEASE_CAPSULE | Freq: Every day | ORAL | Status: DC

## 2012-02-06 NOTE — Telephone Encounter (Signed)
Pt's husband said he would like her to go back to 20 mg, so I sent in the new RX to her pharmacy.

## 2012-02-06 NOTE — Telephone Encounter (Signed)
The record says she was on 40 mg a day. It is fine to switch back to 20 mg daily.

## 2012-02-06 NOTE — Telephone Encounter (Signed)
Husband CB and asked some ?s about pt's high triglycerides. I advised some changes that pt could make to try to reduce trigly., but husband also reported that pt had not been fasting for the lipid test. I advised that she RTC in 3 mos fasting to have it rechecked so that it will be more accurate. He agreed.

## 2012-02-24 ENCOUNTER — Other Ambulatory Visit: Payer: Self-pay | Admitting: Emergency Medicine

## 2012-02-24 ENCOUNTER — Telehealth: Payer: Self-pay

## 2012-02-24 DIAGNOSIS — Z1231 Encounter for screening mammogram for malignant neoplasm of breast: Secondary | ICD-10-CM

## 2012-02-24 NOTE — Telephone Encounter (Signed)
PT SAYS DR. DAUB SAW HER LAST.  SAID 40 MG

## 2012-02-24 NOTE — Telephone Encounter (Signed)
PT SAYS DR. DAUB SAW HER LAST.  SAID 40 MG OF PRILOSEC WAS CALLED IN FOR HER INSTEAD OF 20 MG.  THE 40 MG HURT HER STOMACH, SO SHE HAS BEEN TAKING SOME OF HER HUSBAND'S 20 MG PRILOSEC.  LOOKING AT THE CHART IT SHOWS ONLY 20 MG WAS CALLED IN. SHE DOESN'T KNOW IF WE MADE AN ERROR OR THE PHARMACY.  PLEASE [phone]

## 2012-02-25 NOTE — Telephone Encounter (Signed)
Dr Cleta Alberts prescribed 20mg  tablet sent in to [facility] in [place]. I called patient to advise, did not get answer.

## 2012-02-27 NOTE — Telephone Encounter (Signed)
I spoke to her husband to advise. The pharmacy has fixed this for her.

## 2012-03-09 DIAGNOSIS — G8929 Other chronic pain: Secondary | ICD-10-CM | POA: Insufficient documentation

## 2012-03-16 ENCOUNTER — Other Ambulatory Visit: Payer: Self-pay | Admitting: Gynecology

## 2012-03-16 DIAGNOSIS — N63 Unspecified lump in unspecified breast: Secondary | ICD-10-CM

## 2012-03-16 DIAGNOSIS — N644 Mastodynia: Secondary | ICD-10-CM

## 2012-04-06 ENCOUNTER — Ambulatory Visit: Payer: Medicare Other

## 2012-04-06 ENCOUNTER — Ambulatory Visit
Admission: RE | Admit: 2012-04-06 | Discharge: 2012-04-06 | Disposition: A | Discharge status: Home / Self Care | Payer: Medicare Other | Source: Ambulatory Visit | Attending: Gynecology | Admitting: Gynecology

## 2012-04-06 DIAGNOSIS — N644 Mastodynia: Secondary | ICD-10-CM

## 2012-04-06 DIAGNOSIS — N63 Unspecified lump in unspecified breast: Secondary | ICD-10-CM

## 2012-08-03 ENCOUNTER — Ambulatory Visit: Payer: Medicare Other | Admitting: Emergency Medicine

## 2012-08-06 ENCOUNTER — Other Ambulatory Visit: Payer: Self-pay | Admitting: Radiology

## 2012-08-06 NOTE — Telephone Encounter (Signed)
Pharmacy requesting Valacyclovir 1gm 1/2 tablet once daily #15 was previously written by Dr Nicholas Lose

## 2012-08-09 MED ORDER — VALACYCLOVIR HCL 1 G PO TABS: 500.0000 mg | ORAL_TABLET | Freq: Every day | ORAL | Status: DC

## 2012-11-25 ENCOUNTER — Telehealth: Payer: Self-pay

## 2012-11-25 NOTE — Telephone Encounter (Signed)
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 Is this what she is using the prescription for.

## 2012-11-25 NOTE — Telephone Encounter (Signed)
I pended one RF.

## 2012-11-25 NOTE — Telephone Encounter (Signed)
Pharm reqs RF of valtrex 1 gm, take 1/2 tablet po QD. Dr Cleta Alberts, you Dx pt w/shingles in July, 2010 and Rxd this. Herbert Seta gave her a RF in July this year, but not sure if she knew that it was Rxd for shingles. Do you want to give pt RFs or does pt need to come for eval? You last saw pt in Jan, 2014.

## 2012-11-26 MED ORDER — VALACYCLOVIR HCL 1 G PO TABS: 500.0000 mg | ORAL_TABLET | Freq: Every day | ORAL | Status: DC

## 2012-11-26 NOTE — Telephone Encounter (Signed)
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 I will send in RFs for pt per protocol. Can I add HSV II to her problem list for future reference?   Pt also requests RFs of her prochlorperazine 10 mg for nausea and Xanax 0.5 mg? I have pended Rx for prochlorperazine but don't know sig or quantity for Xanax you want to Rx. Dr Cleta Alberts, please advise.

## 2012-11-29 ENCOUNTER — Telehealth: Payer: Self-pay

## 2012-11-29 NOTE — Telephone Encounter (Signed)
Pt's husband called requesting refills on the xanax and nausea medicine.  He says pharmacy told him he had to call for refills. [phone]

## 2012-11-29 NOTE — Telephone Encounter (Signed)
There has already been a message sent to Dr Cleta Alberts, regarding this, please advise.

## 2012-11-29 NOTE — Telephone Encounter (Signed)
Pt's husband called requesting refills on her xanax and nausea pills.   Pharmacy told him they had to call for refills. [phone]

## 2012-11-30 NOTE — Telephone Encounter (Signed)
It is okay to refill both medications.

## 2012-12-01 MED ORDER — PROCHLORPERAZINE MALEATE 10 MG PO TABS: 10.0000 mg | ORAL_TABLET | Freq: Four times a day (QID) | ORAL | Status: DC | PRN

## 2012-12-01 MED ORDER — ALPRAZOLAM 0.5 MG PO TABS: 0.5000 mg | ORAL_TABLET | Freq: Every evening | ORAL | Status: DC | PRN

## 2012-12-01 NOTE — Addendum Note (Signed)
Addended byCaffie Damme on: 12/01/2012 09:32 AM   Modules accepted: Orders

## 2012-12-01 NOTE — Telephone Encounter (Signed)
Sent in

## 2012-12-24 NOTE — Telephone Encounter (Signed)
Rx's were sent.  

## 2013-01-20 ENCOUNTER — Other Ambulatory Visit: Payer: Self-pay

## 2013-01-20 MED ORDER — PROCHLORPERAZINE MALEATE 10 MG PO TABS: 10.0000 mg | ORAL_TABLET | Freq: Four times a day (QID) | ORAL | Status: DC | PRN

## 2013-02-04 ENCOUNTER — Telehealth: Payer: Self-pay

## 2013-02-04 DIAGNOSIS — I10 Essential (primary) hypertension: Secondary | ICD-10-CM

## 2013-02-04 DIAGNOSIS — F32A Depression, unspecified: Secondary | ICD-10-CM

## 2013-02-04 DIAGNOSIS — F329 Major depressive disorder, single episode, unspecified: Secondary | ICD-10-CM

## 2013-02-04 NOTE — Telephone Encounter (Signed)
We need to get this prescription she has prescriptions for Prozac and other opinions from a recent hospitalization

## 2013-02-04 NOTE — Telephone Encounter (Signed)
Dr Everlene Farrier, pharm is requesting a 90 day supply of alprazolam. You haven't seen pt since 01/2012. Do you want to deny?

## 2013-02-16 MED ORDER — RAMIPRIL 5 MG PO CAPS: 5.0000 mg | ORAL_CAPSULE | Freq: Every day | ORAL | Status: DC

## 2013-02-16 MED ORDER — PROCHLORPERAZINE MALEATE 10 MG PO TABS: 10.0000 mg | ORAL_TABLET | Freq: Four times a day (QID) | ORAL | Status: DC | PRN

## 2013-02-16 MED ORDER — SERTRALINE HCL 100 MG PO TABS: 100.0000 mg | ORAL_TABLET | Freq: Every day | ORAL | Status: DC

## 2013-02-16 NOTE — Telephone Encounter (Signed)
Spoke w/pt and advised she is very overdue for f/up. She stated she can come in Sunday to see Dr Everlene Farrier, but would like a 30 day supply of zoloft, altace and prochlorperazine to cover her until then. She can wait on the alprazolam and other meds until OV. Pt stated she has not been put on Prozac at hospital. Sending in these three RFs.

## 2013-02-16 NOTE — Telephone Encounter (Signed)
Patient needs refill on Zoloft 100mg , Altace 5mg , and a nausea medication (patient doesn't know the name).Patient is wondering when those will be ready for her.

## 2013-02-20 ENCOUNTER — Ambulatory Visit: Payer: Medicare HMO

## 2013-02-20 ENCOUNTER — Ambulatory Visit (INDEPENDENT_AMBULATORY_CARE_PROVIDER_SITE_OTHER): Payer: Medicare HMO | Admitting: Emergency Medicine

## 2013-02-20 ENCOUNTER — Telehealth: Payer: Self-pay

## 2013-02-20 VITALS — BP 110/66 | HR 88 | Temp 97.9°F | Resp 16 | Ht 62.5 in | Wt 141.5 lb

## 2013-02-20 DIAGNOSIS — E119 Type 2 diabetes mellitus without complications: Secondary | ICD-10-CM

## 2013-02-20 DIAGNOSIS — Z9109 Other allergy status, other than to drugs and biological substances: Secondary | ICD-10-CM

## 2013-02-20 DIAGNOSIS — F3289 Other specified depressive episodes: Secondary | ICD-10-CM

## 2013-02-20 DIAGNOSIS — R109 Unspecified abdominal pain: Secondary | ICD-10-CM

## 2013-02-20 DIAGNOSIS — K121 Other forms of stomatitis: Secondary | ICD-10-CM

## 2013-02-20 DIAGNOSIS — F32A Depression, unspecified: Secondary | ICD-10-CM

## 2013-02-20 DIAGNOSIS — F329 Major depressive disorder, single episode, unspecified: Secondary | ICD-10-CM

## 2013-02-20 DIAGNOSIS — R300 Dysuria: Secondary | ICD-10-CM

## 2013-02-20 DIAGNOSIS — G629 Polyneuropathy, unspecified: Secondary | ICD-10-CM

## 2013-02-20 DIAGNOSIS — G589 Mononeuropathy, unspecified: Secondary | ICD-10-CM

## 2013-02-20 DIAGNOSIS — E785 Hyperlipidemia, unspecified: Secondary | ICD-10-CM

## 2013-02-20 DIAGNOSIS — K1370 Unspecified lesions of oral mucosa: Secondary | ICD-10-CM

## 2013-02-20 LAB — POCT CBC
GRANULOCYTE PERCENT: 58.4 % (ref 37–80)
HCT, POC: 45.6 % (ref 37.7–47.9)
Hemoglobin: 14.5 g/dL (ref 12.2–16.2)
Lymph, poc: 3.4 (ref 0.6–3.4)
MCH: 30.1 pg (ref 27–31.2)
MCHC: 31.8 g/dL (ref 31.8–35.4)
MCV: 94.7 fL (ref 80–97)
MID (cbc): 0.7 (ref 0–0.9)
MPV: 9.1 fL (ref 0–99.8)
PLATELET COUNT, POC: 286 10*3/uL (ref 142–424)
POC GRANULOCYTE: 5.7 (ref 2–6.9)
POC LYMPH PERCENT: 34.7 %L (ref 10–50)
POC MID %: 6.9 % (ref 0–12)
RBC: 4.82 M/uL (ref 4.04–5.48)
RDW, POC: 15 %
WBC: 9.7 10*3/uL (ref 4.6–10.2)

## 2013-02-20 LAB — GLUCOSE, POCT (MANUAL RESULT ENTRY): POC GLUCOSE: 90 mg/dL (ref 70–99)

## 2013-02-20 LAB — POCT UA - MICROSCOPIC ONLY
Bacteria, U Microscopic: NEGATIVE
CASTS, UR, LPF, POC: NEGATIVE
CRYSTALS, UR, HPF, POC: NEGATIVE
Mucus, UA: NEGATIVE
RBC, urine, microscopic: NEGATIVE
WBC, UR, HPF, POC: NEGATIVE
YEAST UA: NEGATIVE

## 2013-02-20 LAB — COMPREHENSIVE METABOLIC PANEL
ALBUMIN: 4.3 g/dL (ref 3.5–5.2)
ALK PHOS: 68 U/L (ref 39–117)
ALT: 17 U/L (ref 0–35)
AST: 18 U/L (ref 0–37)
BILIRUBIN TOTAL: 0.4 mg/dL (ref 0.2–1.2)
BUN: 7 mg/dL (ref 6–23)
CO2: 28 mEq/L (ref 19–32)
Calcium: 10 mg/dL (ref 8.4–10.5)
Chloride: 100 mEq/L (ref 96–112)
Creat: 0.74 mg/dL (ref 0.50–1.10)
Glucose, Bld: 92 mg/dL (ref 70–99)
Potassium: 4.2 mEq/L (ref 3.5–5.3)
SODIUM: 138 meq/L (ref 135–145)
Total Protein: 7.5 g/dL (ref 6.0–8.3)

## 2013-02-20 LAB — LIPID PANEL
Cholesterol: 141 mg/dL (ref 0–200)
HDL: 44 mg/dL (ref >39)
LDL CALC: 59 mg/dL (ref 0–99)
Total CHOL/HDL Ratio: 3.2 Ratio
Triglycerides: 192 mg/dL — ABNORMAL HIGH (ref <150)
VLDL: 38 mg/dL (ref 0–40)

## 2013-02-20 LAB — MICROALBUMIN, URINE: Microalb, Ur: 0.5 mg/dL (ref 0.00–1.89)

## 2013-02-20 LAB — POCT URINALYSIS DIPSTICK
BILIRUBIN UA: NEGATIVE
Blood, UA: NEGATIVE
Glucose, UA: NEGATIVE
Ketones, UA: NEGATIVE
LEUKOCYTES UA: NEGATIVE
NITRITE UA: NEGATIVE
PH UA: 7
PROTEIN UA: NEGATIVE
Spec Grav, UA: 1.01
UROBILINOGEN UA: 0.2

## 2013-02-20 LAB — POCT GLYCOSYLATED HEMOGLOBIN (HGB A1C): Hemoglobin A1C: 6.1

## 2013-02-20 IMAGING — CR DG ABDOMEN ACUTE W/ 1V CHEST
3 series · 3 of 3 positions shown · non-contrast
Comparison: [DATE]

CLINICAL DATA: Left-sided pain.

EXAM:
ACUTE ABDOMEN SERIES (ABDOMEN 2 VIEW & CHEST 1 VIEW)

[PA]
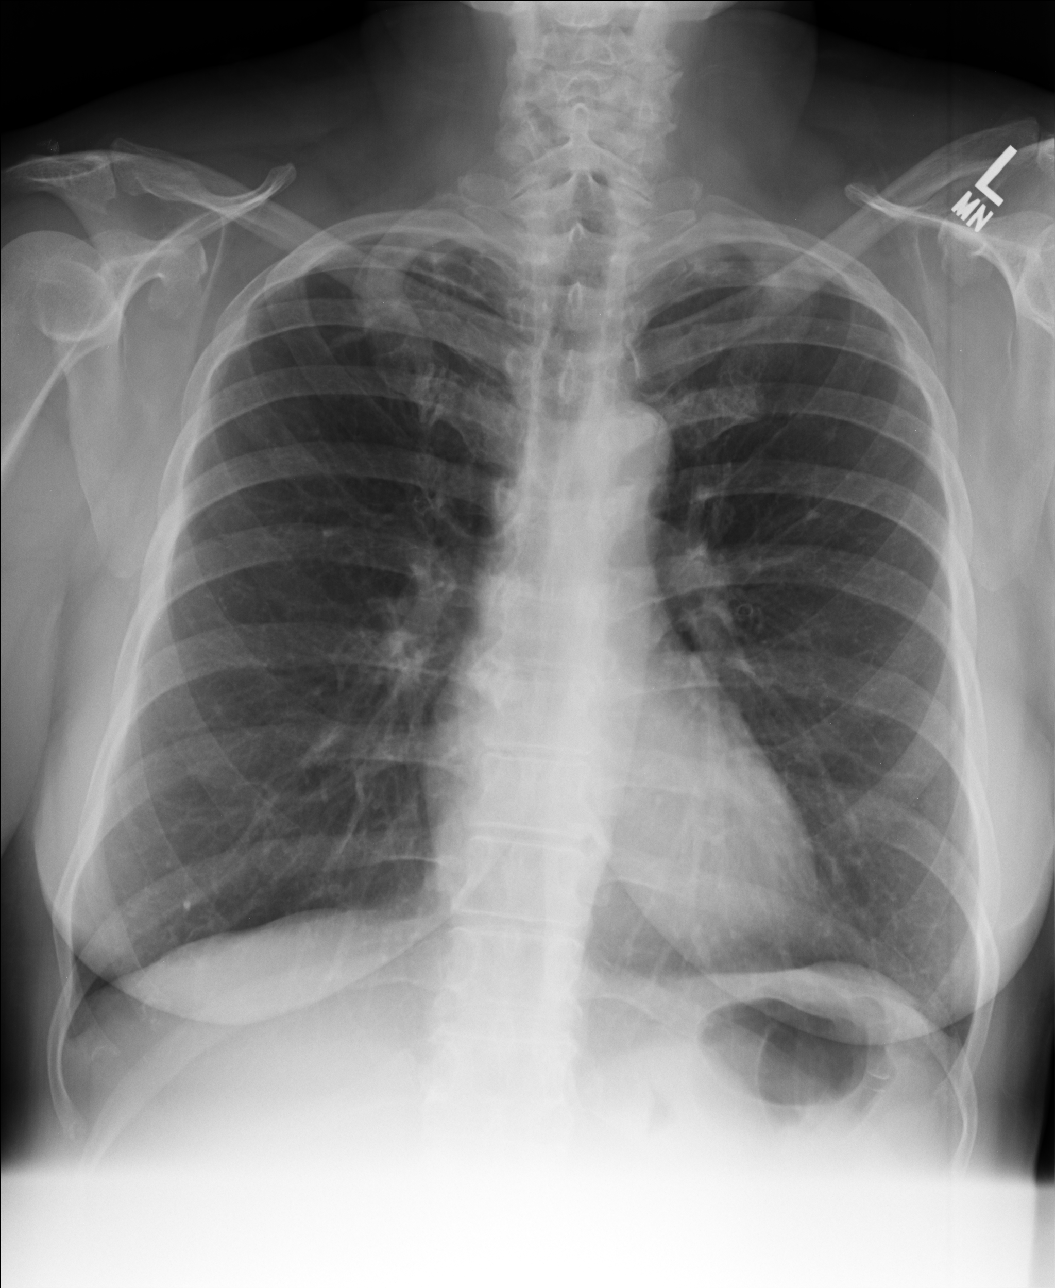

[AP (1 of 2)]
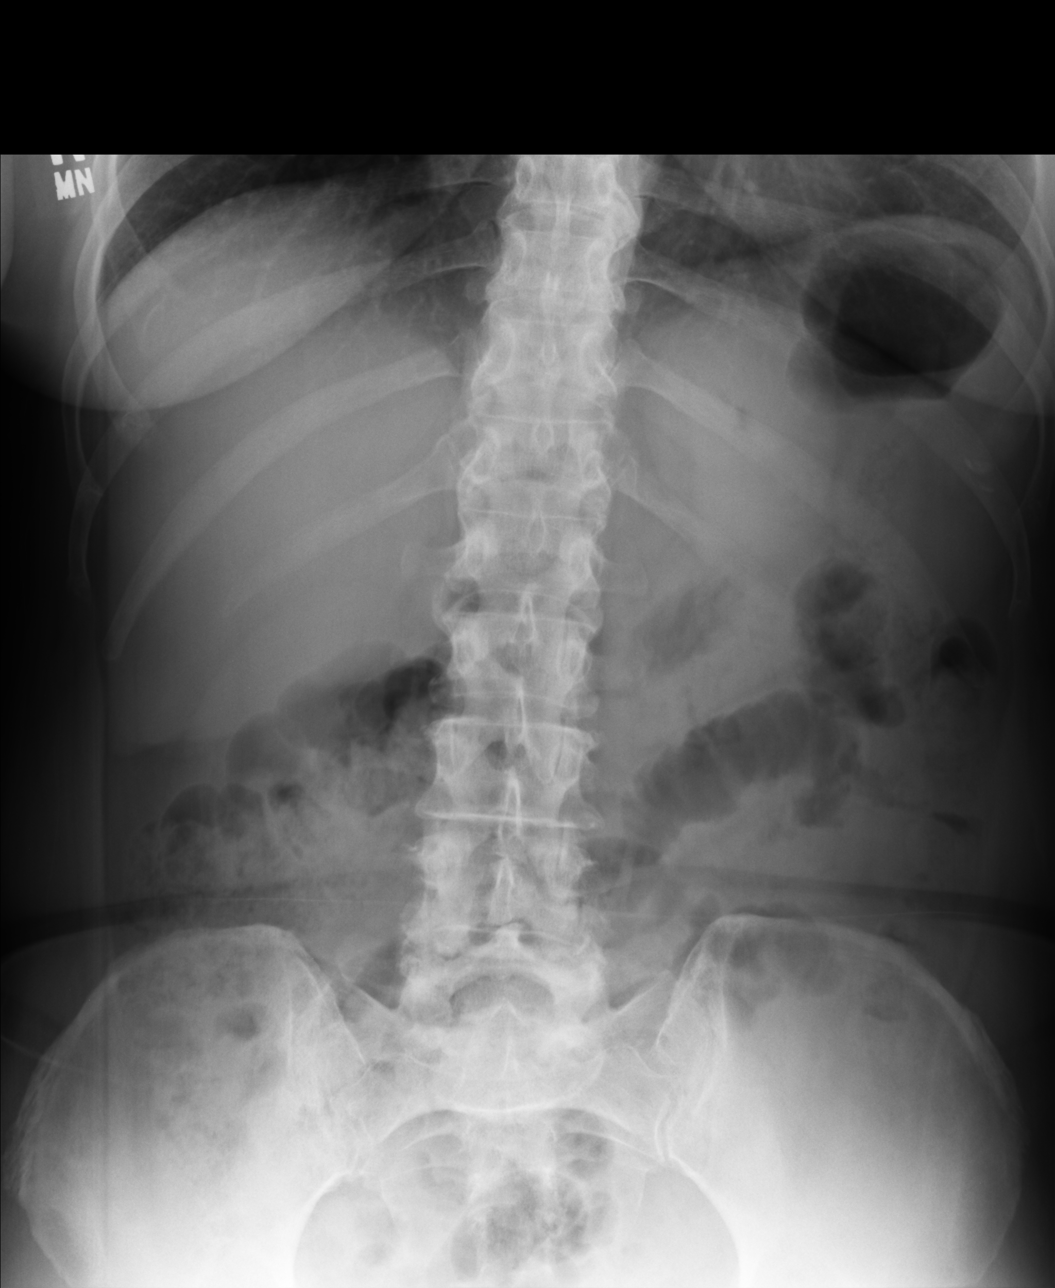

[AP (2 of 2)]
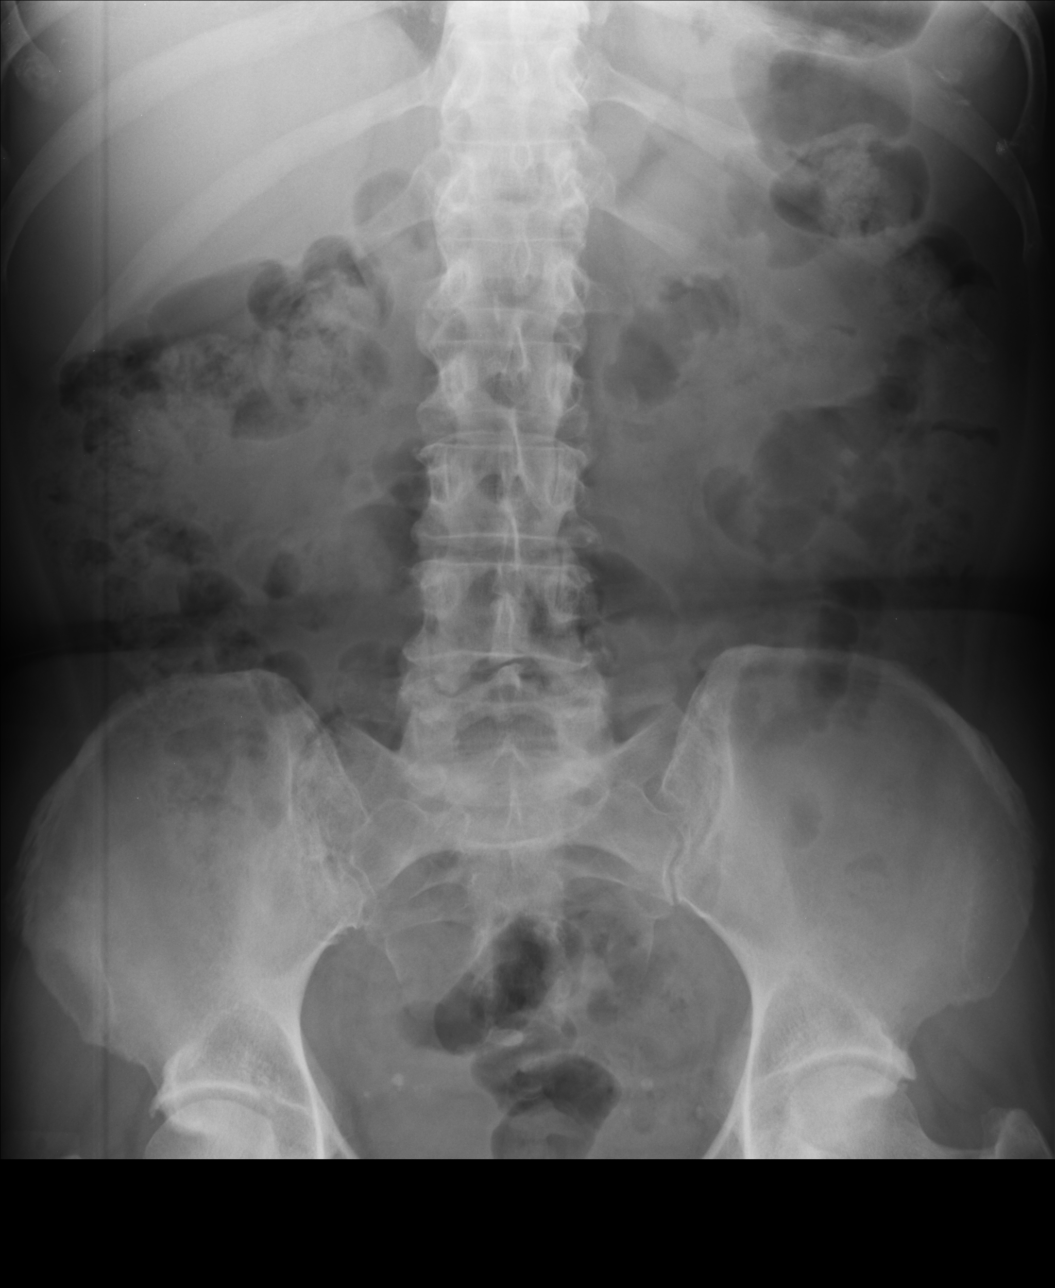

[3 of 3 positions shown; findings below may reference images not displayed]

FINDINGS: Chronic patchy densities at the lung apices, right side greater the
left. The apical densities are most compatible with areas of
scarring. No evidence for airspace disease. Heart size is within
normal limits. No evidence for free air. There is a nonobstructive
bowel gas pattern. There is bowel gas in the small and large bowel.
Small stool burden in the abdomen. No large abdominal
calcifications. Pelvic calcifications are suggestive for
phleboliths. Mild degenerative changes in both hips.
IMPRESSION: No acute chest findings.

Nonspecific bowel gas pattern.

## 2013-02-20 MED ORDER — VALACYCLOVIR HCL 1 G PO TABS: 500.0000 mg | ORAL_TABLET | Freq: Every day | ORAL | Status: DC

## 2013-02-20 MED ORDER — OLOPATADINE HCL 0.1 % OP SOLN: 1.0000 [drp] | Freq: Two times a day (BID) | OPHTHALMIC | Status: DC

## 2013-02-20 MED ORDER — HYDROCODONE-ACETAMINOPHEN 10-325 MG PO TABS: 1.0000 | ORAL_TABLET | Freq: Four times a day (QID) | ORAL | Status: DC | PRN

## 2013-02-20 MED ORDER — SERTRALINE HCL 100 MG PO TABS: 100.0000 mg | ORAL_TABLET | Freq: Every day | ORAL | Status: DC

## 2013-02-20 MED ORDER — GABAPENTIN 300 MG PO CAPS: ORAL_CAPSULE | ORAL | Status: DC

## 2013-02-20 MED ORDER — OMEPRAZOLE 20 MG PO CPDR: 20.0000 mg | DELAYED_RELEASE_CAPSULE | Freq: Every day | ORAL | Status: DC

## 2013-02-20 MED ORDER — METFORMIN HCL 500 MG PO TABS: 500.0000 mg | ORAL_TABLET | Freq: Two times a day (BID) | ORAL | Status: DC

## 2013-02-20 MED ORDER — FIRST-DUKES MOUTHWASH MT SUSP: OROMUCOSAL | Status: DC

## 2013-02-20 MED ORDER — RAMIPRIL 5 MG PO CAPS: 5.0000 mg | ORAL_CAPSULE | Freq: Every day | ORAL | Status: DC

## 2013-02-20 MED ORDER — ALPRAZOLAM 0.5 MG PO TABS: 0.5000 mg | ORAL_TABLET | Freq: Every evening | ORAL | Status: DC | PRN

## 2013-02-20 NOTE — Telephone Encounter (Signed)
Pts husband has questions regarding labwork that was done today, he states that a urine culture was taken but they didn't get any results before they left. Best# [phone]

## 2013-02-20 NOTE — Progress Notes (Addendum)
Subjective:    Patient ID: Beth Stanley, female    DOB: August 20, 1950, 63 y.o.   MRN: [id number]  HPI This chart was scribed for Beth Stanley Truda Staub-MD by Lovena Le Day, Scribe. This patient was seen in room 11 and the patient's care was started at 10:17 AM.  HPI Comments: Beth Stanley is a 63 y.o. female who presents to the [facility] for a mouth lesion, problem with her eye and left lower abdominal pain.  To see her oncologist this week on tuesday. UTD on her last imaging. Was Dx in Dec 2003 w/lymphoma. Was tx for year and a half. UTD w/colonoscopy, mammograms and her GYN, whom she is seeing once per year.  Has been working on her diet and has been able to lose some weight. She reports that overall feels good.    Patient Active Problem List   Diagnosis Date Noted  . Diabetes 02/03/2012  . Lymphoma 02/03/2012  . Hyperlipidemia 02/03/2012  . Hypertension 02/03/2012  . Reflux 02/03/2012  . Depression 02/03/2012   Past Surgical History  Procedure Laterality Date  . Total abdominal hysterectomy     No family history on file.  History   Social History  . Marital Status: Divorced    Spouse Name: N/A    Number of Children: N/A  . Years of Education: N/A   Occupational History  . Not on file.   Social History Main Topics  . Smoking status: Current Every Day Smoker  . Smokeless tobacco: Not on file  . Alcohol Use: No  . Drug Use: No  . Sexual Activity: Not on file   Other Topics Concern  . Not on file   Social History Narrative  . No narrative on file   Allergies  Allergen Reactions  . Aspirin    Results for orders placed in visit on 02/20/13  POCT UA - MICROSCOPIC ONLY      Result Value Range   WBC, Ur, HPF, POC neg     RBC, urine, microscopic neg     Bacteria, U Microscopic neg     Mucus, UA neg     Epithelial cells, urine per micros 0-1     Crystals, Ur, HPF, POC neg     Casts, Ur, LPF, POC neg     Yeast, UA neg    POCT  URINALYSIS DIPSTICK      Result Value Range   Color, UA lt yellow     Clarity, UA clear     Glucose, UA neg     Bilirubin, UA neg     Ketones, UA neg     Spec Grav, UA 1.010     Blood, UA neg     pH, UA 7.0     Protein, UA neg     Urobilinogen, UA 0.2     Nitrite, UA neg     Leukocytes, UA Negative     Review of Systems  Constitutional: Negative for fever and chills.  Respiratory: Negative for cough and shortness of breath.   Cardiovascular: Negative for chest pain.  Gastrointestinal: Positive for abdominal pain (left lower abdominal pain). Negative for nausea and vomiting.  Musculoskeletal: Negative for back pain.      Objective:   Physical Exam Nursing note and vitals reviewed. Constitutional: Patient is oriented to person, place, and time. Patient appears well-developed and well-nourished. No distress.  there is a 3 mm cystic area mid left lower lid. HENT: I did not feel any  masses in her mouth she may be feeling towards teeny or even possibly a small mucocele. There were no mucosal irregularities noted Head: Normocephalic and atraumatic.  Neck: Neck supple. No tracheal deviation present.  Cardiovascular: Normal rate, regular rhythm and normal heart sounds.   No murmur heard. Pulmonary/Chest: Effort normal and breath sounds normal. No respiratory distress. Patient has no wheezes. Patient has no rales.  Musculoskeletal: Normal range of motion.  Neurological: Patient is alert and oriented to person, place, and time.  Skin: Skin is warm and dry.  Psychiatric: Patient has a normal mood and affect. Patient's behavior is normal.  Abdomen there is mild discomfort left mid abdomen. Results for orders placed in visit on 02/20/13  POCT UA - MICROSCOPIC ONLY      Result Value Range   WBC, Ur, HPF, POC neg     RBC, urine, microscopic neg     Bacteria, U Microscopic neg     Mucus, UA neg     Epithelial cells, urine per micros 0-1     Crystals, Ur, HPF, POC neg     Casts, Ur, LPF,  POC neg     Yeast, UA neg    POCT URINALYSIS DIPSTICK      Result Value Range   Color, UA lt yellow     Clarity, UA clear     Glucose, UA neg     Bilirubin, UA neg     Ketones, UA neg     Spec Grav, UA 1.010     Blood, UA neg     pH, UA 7.0     Protein, UA neg     Urobilinogen, UA 0.2     Nitrite, UA neg     Leukocytes, UA Negative    POCT CBC      Result Value Range   WBC 9.7  4.6 - 10.2 K/uL   Lymph, poc 3.4  0.6 - 3.4   POC LYMPH PERCENT 34.7  10 - 50 %L   MID (cbc) 0.7  0 - 0.9   POC MID % 6.9  0 - 12 %M   POC Granulocyte 5.7  2 - 6.9   Granulocyte percent 58.4  37 - 80 %G   RBC 4.82  4.04 - 5.48 M/uL   Hemoglobin 14.5  12.2 - 16.2 g/dL   HCT, POC 45.6  37.7 - 47.9 %   MCV 94.7  80 - 97 fL   MCH, POC 30.1  27 - 31.2 pg   MCHC 31.8  31.8 - 35.4 g/dL   RDW, POC 15.0     Platelet Count, POC 286  142 - 424 K/uL   MPV 9.1  0 - 99.8 fL  GLUCOSE, POCT (MANUAL RESULT ENTRY)      Result Value Range   POC Glucose 90  70 - 99 mg/dl  POCT GLYCOSYLATED HEMOGLOBIN (HGB A1C)      Result Value Range   Hemoglobin A1C 6.1    [facility] reading (PRIMARY) by  Dr. Everlene Farrier there is a linear calcified area in the right upper lobe which I suspect is a calcified lymphatic or vessel. There is a moderate stool burden but I do not see any acute changes on her abdominal series   Triage Vitals: BP 110/66  Pulse 88  Temp(Src) 97.9 F (36.6 C) (Oral)  Resp 16  Ht 5' 2.5" (1.588 m)  Wt 141 lb 8 oz (64.184 kg)  BMI 25.45 kg/m2  SpO2 98%  DIAGNOSTIC STUDIES: Oxygen Saturation  is 98% on room air, normal by my interpretation.    COORDINATION OF CARE: At 1000 AM Discussed treatment plan with patient which includes labs and x-rays. Scheduled Meds: Continuous Infusions: PRN Meds:.   Meds ordered this encounter  Medications  . ALPRAZolam (XANAX) 0.5 MG tablet    Sig: Take 1 tablet (0.5 mg total) by mouth at bedtime as needed for anxiety.    Dispense:  30 tablet    Refill:  5  . gabapentin  (NEURONTIN) 300 MG capsule    Sig: Take 1 tablet each night for peripheral neuropathy    Dispense:  90 capsule    Refill:  3  . HYDROcodone-acetaminophen (NORCO) 10-325 MG per tablet    Sig: Take 1 tablet by mouth every 6 (six) hours as needed.    Dispense:  60 tablet    Refill:  0  . metFORMIN (GLUCOPHAGE) 500 MG tablet    Sig: Take 1 tablet (500 mg total) by mouth 2 (two) times daily with a meal.    Dispense:  180 tablet    Refill:  3  . omeprazole (PRILOSEC) 20 MG capsule    Sig: Take 1 capsule (20 mg total) by mouth daily.    Dispense:  90 capsule    Refill:  3  . ramipril (ALTACE) 5 MG capsule    Sig: Take 1 capsule (5 mg total) by mouth daily.    Dispense:  90 capsule    Refill:  3  . sertraline (ZOLOFT) 100 MG tablet    Sig: Take 1 tablet (100 mg total) by mouth daily.    Dispense:  90 tablet    Refill:  3  . valACYclovir (VALTREX) 1000 MG tablet    Sig: Take 0.5 tablets (500 mg total) by mouth daily.    Dispense:  30 tablet    Refill:  11       Assessment & Plan:  Go ahead and refill medications. She is going to discuss with her oncologist on Tuesday what imaging would be most appropriate for her abdominal discomfort. She will have her oncologist checked her mouth to see if he feels like any other evaluation is necessary .   I personally performed the services described in this documentation, which was scribed in my presence. The recorded information has been reviewed and is accurate.t}

## 2013-02-20 NOTE — Telephone Encounter (Signed)
Explained urine culture sent out to lab and will be a few days before we get results back. Voiced understanding.

## 2013-02-21 ENCOUNTER — Telehealth: Payer: Self-pay

## 2013-02-21 NOTE — Telephone Encounter (Signed)
[place] HAVE AN APPT AT [facility] TOMORROW WHICH WE REFERRED, AND THEY NEED THE XRAY REPORT ALONG WITH THE NOTES FOR THE DR TO REVIEW THEY WILL BE ABLE TO DO A CAT SCAN SINCE SHE IS HAVING ABDOMINAL PAINS AS WELL. PLEASE FAX TO KIM AT [facility] [phone]

## 2013-02-21 NOTE — Telephone Encounter (Signed)
Faxed through [employer] notes with labs and manually faxed x-ray report to Kim at [facility]. With confirmation.

## 2013-02-22 MED ORDER — CIPROFLOXACIN HCL 250 MG PO TABS: 250.0000 mg | ORAL_TABLET | Freq: Two times a day (BID) | ORAL | Status: DC

## 2013-02-22 NOTE — Addendum Note (Signed)
Addended by: Jethro Bolus A on: 02/22/2013 09:53 AM   Modules accepted: Orders

## 2013-02-23 ENCOUNTER — Other Ambulatory Visit: Payer: Self-pay | Admitting: Family Medicine

## 2013-02-23 DIAGNOSIS — N390 Urinary tract infection, site not specified: Secondary | ICD-10-CM

## 2013-02-23 LAB — URINE CULTURE: Colony Count: 30000

## 2013-02-24 ENCOUNTER — Telehealth: Payer: Self-pay

## 2013-02-24 MED ORDER — CIPROFLOXACIN HCL 250 MG PO TABS: 250.0000 mg | ORAL_TABLET | Freq: Two times a day (BID) | ORAL | Status: DC

## 2013-02-24 NOTE — Telephone Encounter (Signed)
They were unaware it was sent to the pharmacy. They picked it up yesterday, and then she dropped the pills in the sink when she was getting water to drink. Can we please go ahead and prescribe this, she is in great pain. Her pharmacy closes tonight at 6.

## 2013-02-24 NOTE — Telephone Encounter (Signed)
Toma Deiters pt's husband, is calling  Because pt accidentally knocked 5 of her pills (the antibiotic prescribed) into her dish water. She would like to have more called in asap. Best#  [phone]  Pharmacy: [facility] in [place]

## 2013-02-24 NOTE — Telephone Encounter (Signed)
Done

## 2013-02-24 NOTE — Telephone Encounter (Signed)
Called patient and told her to pick up RX from pharmacy

## 2013-02-24 NOTE — Telephone Encounter (Signed)
Pt was prescribed #6 pills on 02/20/13 - so taking as directed, would have completed the course of medication on 02/22/13.  How has pt been taking the medication?

## 2013-03-04 ENCOUNTER — Other Ambulatory Visit: Payer: Self-pay | Admitting: Emergency Medicine

## 2013-03-04 ENCOUNTER — Other Ambulatory Visit (INDEPENDENT_AMBULATORY_CARE_PROVIDER_SITE_OTHER): Payer: Commercial Managed Care - HMO | Admitting: Family Medicine

## 2013-03-04 DIAGNOSIS — R300 Dysuria: Secondary | ICD-10-CM

## 2013-03-04 DIAGNOSIS — N390 Urinary tract infection, site not specified: Secondary | ICD-10-CM | POA: Diagnosis not present

## 2013-03-04 LAB — POCT URINALYSIS DIPSTICK
Bilirubin, UA: NEGATIVE
Blood, UA: NEGATIVE
Glucose, UA: NEGATIVE
Ketones, UA: NEGATIVE
LEUKOCYTES UA: NEGATIVE
NITRITE UA: NEGATIVE
PH UA: 5
PROTEIN UA: NEGATIVE
Spec Grav, UA: 1.015
UROBILINOGEN UA: 0.2

## 2013-03-04 LAB — POCT UA - MICROSCOPIC ONLY
CASTS, UR, LPF, POC: NEGATIVE
Crystals, Ur, HPF, POC: NEGATIVE
Mucus, UA: NEGATIVE
RBC, urine, microscopic: NEGATIVE
WBC, Ur, HPF, POC: NEGATIVE
YEAST UA: NEGATIVE

## 2013-03-04 NOTE — Progress Notes (Signed)
Patient dropped of urine only

## 2013-03-05 LAB — URINE CULTURE

## 2013-03-30 ENCOUNTER — Other Ambulatory Visit: Payer: Self-pay

## 2013-03-30 NOTE — Telephone Encounter (Signed)
Dr Everlene Farrier, you have Rxd this med several times for pt but always just for 30 days w/out RFs. Do you want to give RFs? Pended w/RFs

## 2013-03-31 MED ORDER — PROCHLORPERAZINE MALEATE 10 MG PO TABS: 10.0000 mg | ORAL_TABLET | Freq: Four times a day (QID) | ORAL | Status: DC | PRN

## 2013-03-31 NOTE — Telephone Encounter (Signed)
It is okay to put 5 refills on this medication

## 2013-04-13 ENCOUNTER — Other Ambulatory Visit: Payer: Self-pay | Admitting: Emergency Medicine

## 2013-04-13 DIAGNOSIS — Z1231 Encounter for screening mammogram for malignant neoplasm of breast: Secondary | ICD-10-CM

## 2013-04-26 ENCOUNTER — Ambulatory Visit: Payer: Medicare Other

## 2013-07-18 ENCOUNTER — Ambulatory Visit: Payer: Medicare Other

## 2013-08-02 ENCOUNTER — Ambulatory Visit: Payer: Medicare Other

## 2013-08-09 ENCOUNTER — Ambulatory Visit
Admission: RE | Admit: 2013-08-09 | Discharge: 2013-08-09 | Disposition: A | Discharge status: Home / Self Care | Payer: Commercial Managed Care - HMO | Source: Ambulatory Visit | Attending: Emergency Medicine | Admitting: Emergency Medicine

## 2013-08-09 DIAGNOSIS — Z1231 Encounter for screening mammogram for malignant neoplasm of breast: Secondary | ICD-10-CM

## 2013-08-26 ENCOUNTER — Other Ambulatory Visit: Payer: Self-pay

## 2013-08-26 MED ORDER — ALPRAZOLAM 0.5 MG PO TABS: 0.5000 mg | ORAL_TABLET | Freq: Every evening | ORAL | Status: DC | PRN

## 2013-08-26 NOTE — Telephone Encounter (Signed)
Pharm requests RF of alprazolam. Pt is due for f/up, I have pended 1 mos RF w/note to [facility] for more for your review.

## 2013-08-29 NOTE — Telephone Encounter (Signed)
Called in.

## 2013-09-02 ENCOUNTER — Telehealth: Payer: Self-pay | Admitting: *Deleted

## 2013-09-02 NOTE — Telephone Encounter (Signed)
Pt's son brought in a urine sample to the clinic today. There are no orders in the chart and we have not seen her since feb. Please advise ( I ran sample and placed results in your box just in case )

## 2013-09-03 ENCOUNTER — Other Ambulatory Visit (INDEPENDENT_AMBULATORY_CARE_PROVIDER_SITE_OTHER): Payer: Medicare HMO | Admitting: Family Medicine

## 2013-09-03 DIAGNOSIS — R300 Dysuria: Secondary | ICD-10-CM

## 2013-09-03 LAB — POCT UA - MICROSCOPIC ONLY
BACTERIA, U MICROSCOPIC: NEGATIVE
Casts, Ur, LPF, POC: NEGATIVE
Crystals, Ur, HPF, POC: NEGATIVE
Mucus, UA: NEGATIVE
RBC, URINE, MICROSCOPIC: NEGATIVE
WBC, UR, HPF, POC: NEGATIVE
Yeast, UA: NEGATIVE

## 2013-09-03 LAB — POCT URINALYSIS DIPSTICK
Bilirubin, UA: NEGATIVE
Blood, UA: NEGATIVE
GLUCOSE UA: NEGATIVE
KETONES UA: NEGATIVE
Leukocytes, UA: NEGATIVE
Nitrite, UA: NEGATIVE
Protein, UA: NEGATIVE
Spec Grav, UA: 1.01
Urobilinogen, UA: 0.2
pH, UA: 5.5

## 2013-09-03 NOTE — Telephone Encounter (Signed)
Please order a routine urinalysis. Call patient and get her description of symptoms she is having.

## 2013-09-03 NOTE — Telephone Encounter (Signed)
The urine that we did did not show any signs of infection. If she is having signs and symptoms of a urinary tract infection she would need to return to clinic for [place] to evaluate her.

## 2013-09-03 NOTE — Telephone Encounter (Signed)
Spoke with pt. She is having dysuria and when she tries to go, she is unable to.  Pt's pharm closes at 1 today.

## 2013-09-04 NOTE — Telephone Encounter (Signed)
Advised pt of note and that she must come in for [place] to evaluate her

## 2013-10-06 ENCOUNTER — Other Ambulatory Visit: Payer: Self-pay | Admitting: *Deleted

## 2013-10-06 NOTE — Telephone Encounter (Signed)
Received fax request to refill medication.

## 2013-10-07 MED ORDER — PROCHLORPERAZINE MALEATE 10 MG PO TABS: 10.0000 mg | ORAL_TABLET | Freq: Four times a day (QID) | ORAL | Status: DC | PRN

## 2013-11-14 ENCOUNTER — Ambulatory Visit (HOSPITAL_COMMUNITY)
Admission: RE | Admit: 2013-11-14 | Discharge: 2013-11-14 | Disposition: A | Discharge status: Home / Self Care | Payer: Commercial Managed Care - HMO | Source: Other Acute Inpatient Hospital | Attending: Diagnostic Radiology | Admitting: Diagnostic Radiology

## 2013-11-14 ENCOUNTER — Ambulatory Visit (INDEPENDENT_AMBULATORY_CARE_PROVIDER_SITE_OTHER): Payer: Commercial Managed Care - HMO

## 2013-11-14 ENCOUNTER — Ambulatory Visit (INDEPENDENT_AMBULATORY_CARE_PROVIDER_SITE_OTHER): Payer: Commercial Managed Care - HMO | Admitting: Internal Medicine

## 2013-11-14 ENCOUNTER — Ambulatory Visit (HOSPITAL_COMMUNITY)
Admission: RE | Admit: 2013-11-14 | Discharge: 2013-11-14 | Disposition: A | Discharge status: Home / Self Care | Payer: Commercial Managed Care - HMO | Source: Ambulatory Visit | Attending: Internal Medicine | Admitting: Internal Medicine

## 2013-11-14 VITALS — BP 138/78 | HR 73 | Temp 98.4°F | Resp 16 | Ht 62.0 in | Wt 153.6 lb

## 2013-11-14 DIAGNOSIS — R1012 Left upper quadrant pain: Secondary | ICD-10-CM

## 2013-11-14 DIAGNOSIS — Z90710 Acquired absence of both cervix and uterus: Secondary | ICD-10-CM | POA: Insufficient documentation

## 2013-11-14 DIAGNOSIS — S3991XA Unspecified injury of abdomen, initial encounter: Secondary | ICD-10-CM

## 2013-11-14 DIAGNOSIS — I10 Essential (primary) hypertension: Secondary | ICD-10-CM | POA: Insufficient documentation

## 2013-11-14 DIAGNOSIS — M25552 Pain in left hip: Secondary | ICD-10-CM

## 2013-11-14 DIAGNOSIS — M79642 Pain in left hand: Secondary | ICD-10-CM

## 2013-11-14 DIAGNOSIS — C8590 Non-Hodgkin lymphoma, unspecified, unspecified site: Secondary | ICD-10-CM | POA: Insufficient documentation

## 2013-11-14 DIAGNOSIS — S7002XA Contusion of left hip, initial encounter: Secondary | ICD-10-CM

## 2013-11-14 DIAGNOSIS — E119 Type 2 diabetes mellitus without complications: Secondary | ICD-10-CM | POA: Diagnosis not present

## 2013-11-14 DIAGNOSIS — S3981XA Other specified injuries of abdomen, initial encounter: Secondary | ICD-10-CM

## 2013-11-14 LAB — POCT URINALYSIS DIPSTICK
Bilirubin, UA: NEGATIVE
Glucose, UA: NEGATIVE
Ketones, UA: NEGATIVE
Nitrite, UA: NEGATIVE
PROTEIN UA: NEGATIVE
RBC UA: NEGATIVE
SPEC GRAV UA: 1.015
UROBILINOGEN UA: 0.2
pH, UA: 5

## 2013-11-14 LAB — POCT CBC
Granulocyte percent: 44.3 %G (ref 37–80)
HCT, POC: 39.5 % (ref 37.7–47.9)
Hemoglobin: 13 g/dL (ref 12.2–16.2)
LYMPH, POC: 6 — AB (ref 0.6–3.4)
MCH, POC: 29.4 pg (ref 27–31.2)
MCHC: 33 g/dL (ref 31.8–35.4)
MCV: 89.2 fL (ref 80–97)
MID (CBC): 0.8 (ref 0–0.9)
MPV: 7.1 fL (ref 0–99.8)
POC Granulocyte: 5.4 (ref 2–6.9)
POC LYMPH PERCENT: 49.2 %L (ref 10–50)
POC MID %: 6.5 %M (ref 0–12)
Platelet Count, POC: 241 10*3/uL (ref 142–424)
RBC: 4.43 M/uL (ref 4.04–5.48)
RDW, POC: 14.6 %
WBC: 12.2 10*3/uL — AB (ref 4.6–10.2)

## 2013-11-14 LAB — POCT UA - MICROSCOPIC ONLY
CASTS, UR, LPF, POC: NEGATIVE
Crystals, Ur, HPF, POC: NEGATIVE
MUCUS UA: NEGATIVE
Yeast, UA: NEGATIVE
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
## 2013-11-14 IMAGING — US US ABDOMEN COMPLETE
1 series · 14 of 25 positions shown · non-contrast
Comparison: [DATE]

CLINICAL DATA: Left upper quadrant abdominal pain. History of
hysterectomy.

EXAM:
ULTRASOUND ABDOMEN COMPLETE

[Series 1: us abdomen complete · 0.22mm/px · 14 of 85 slices shown]
[im 1/85]
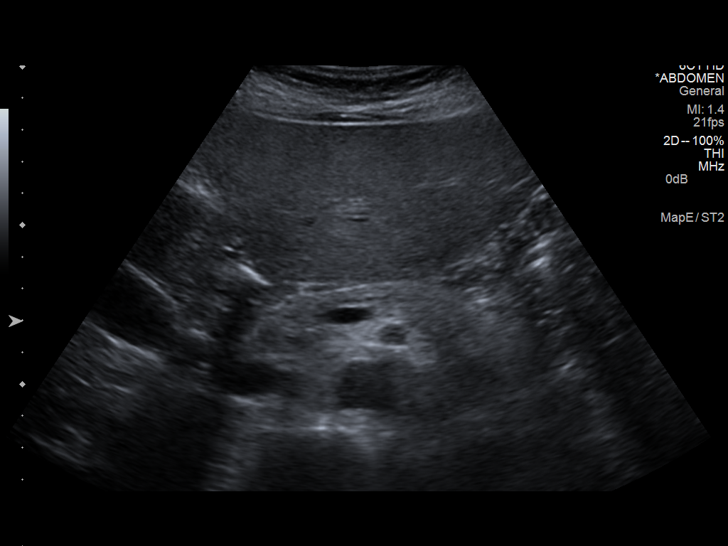
[im 8/85]
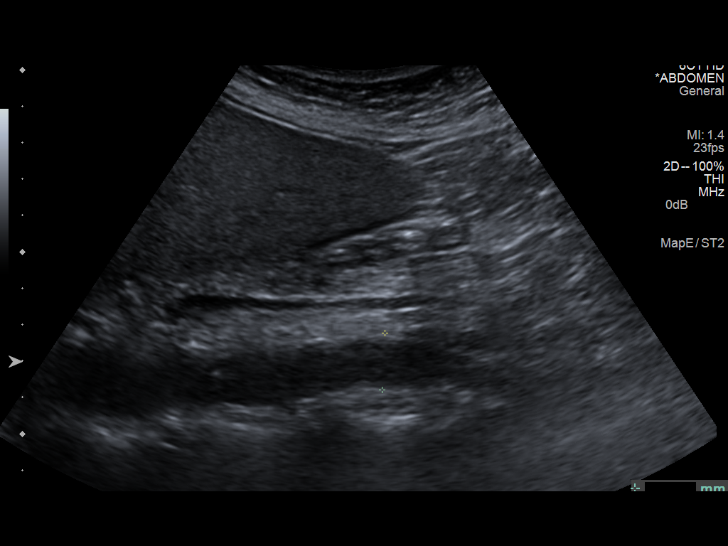
[im 15/85]
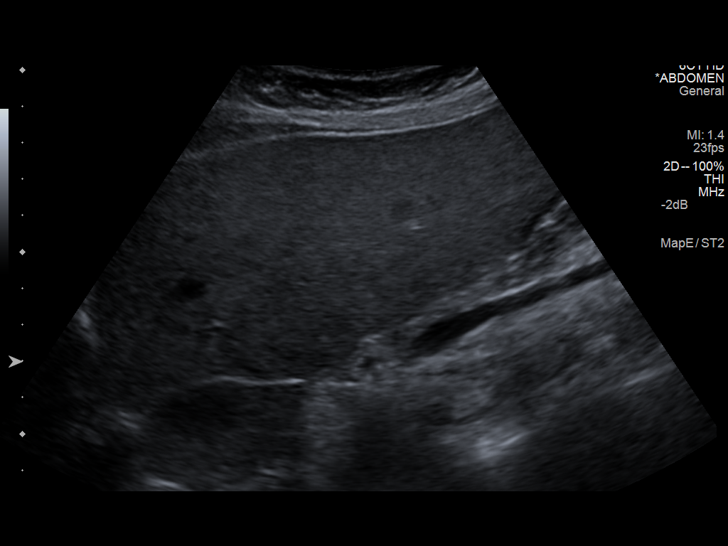
[im 22/85]
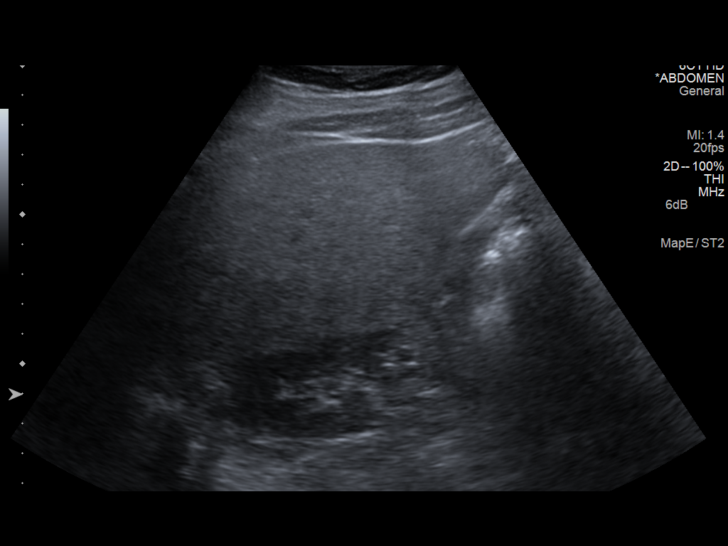
[im 29/85]
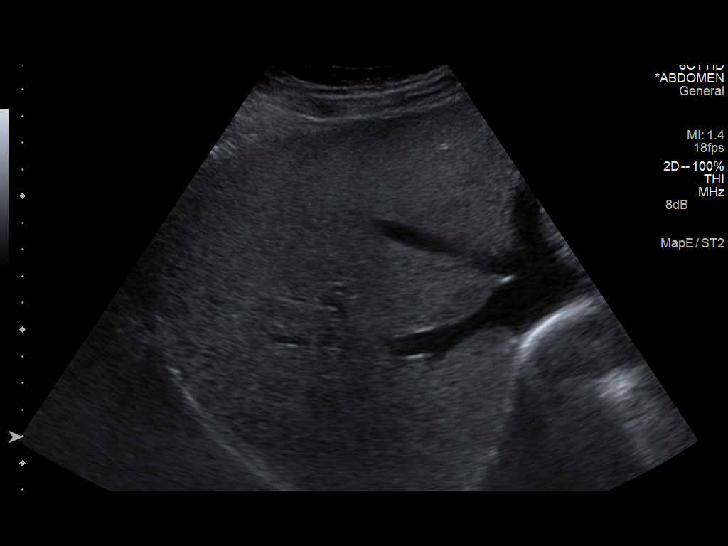
[im 32/85]
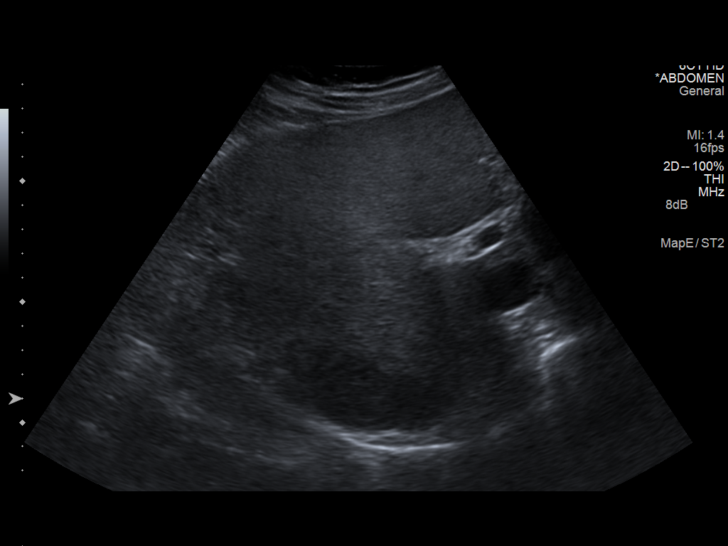
[im 39/85]
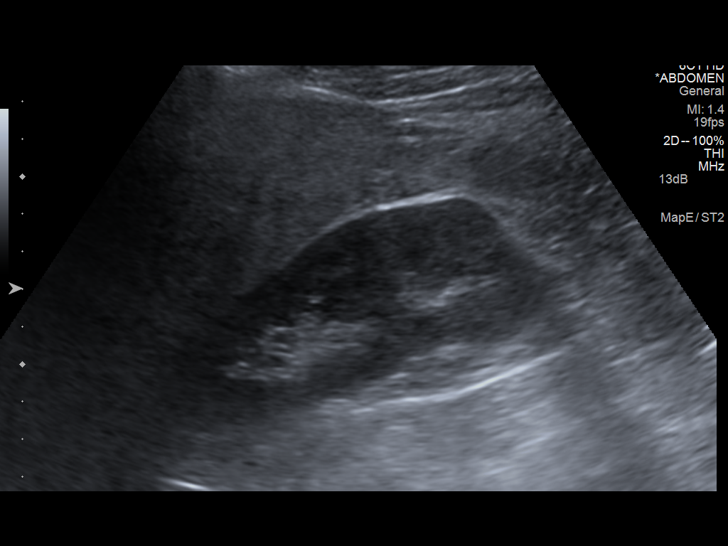
[im 46/85]
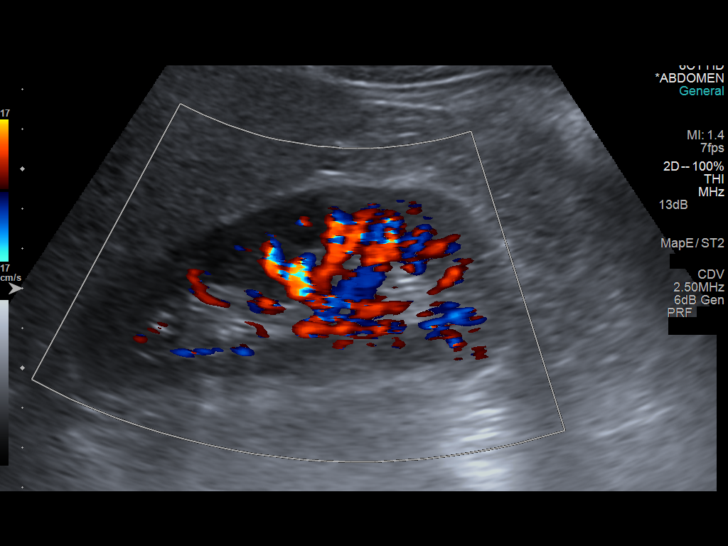
[im 53/85]
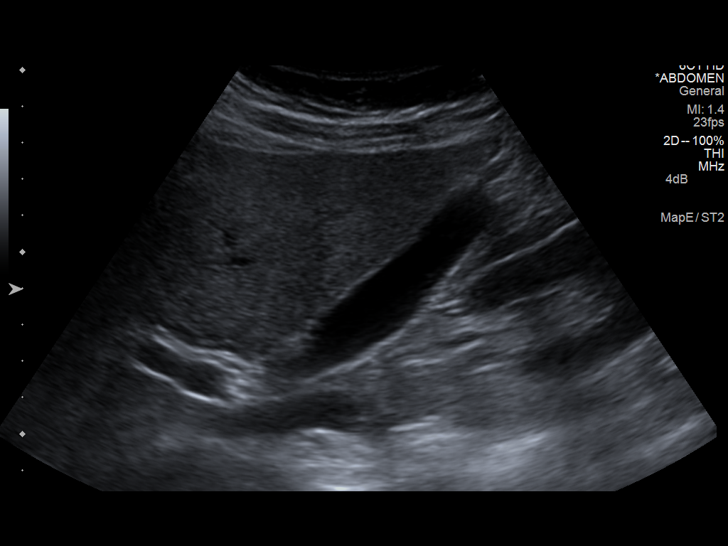
[im 57/85]
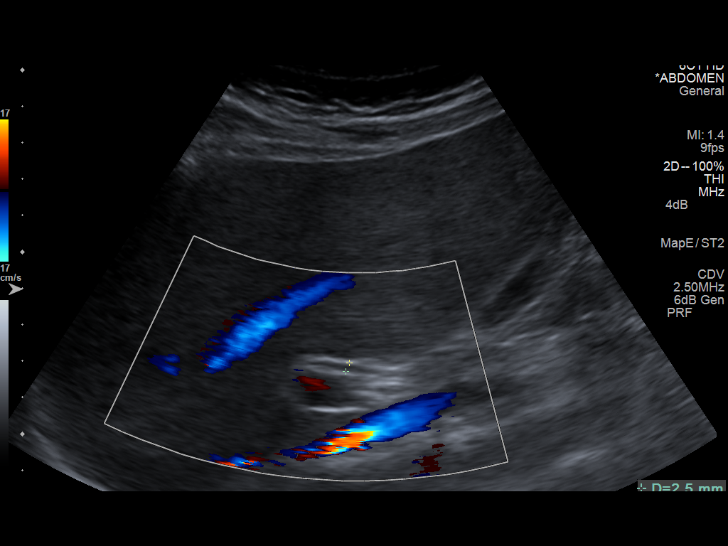
[im 64/85]
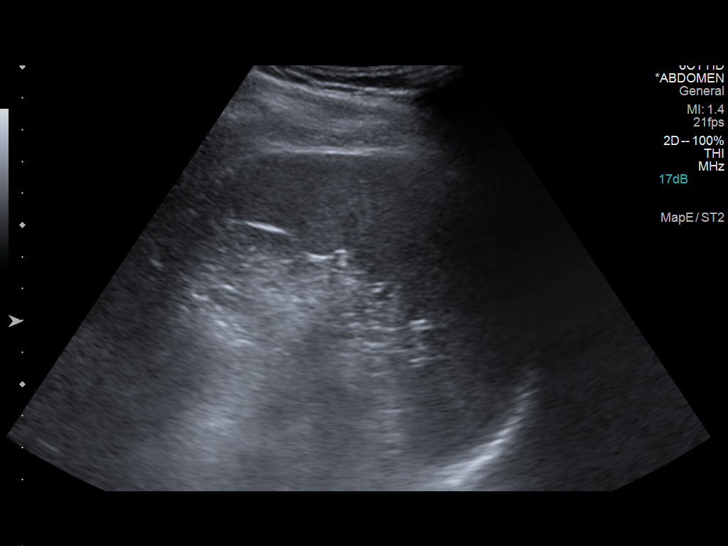
[im 71/85]
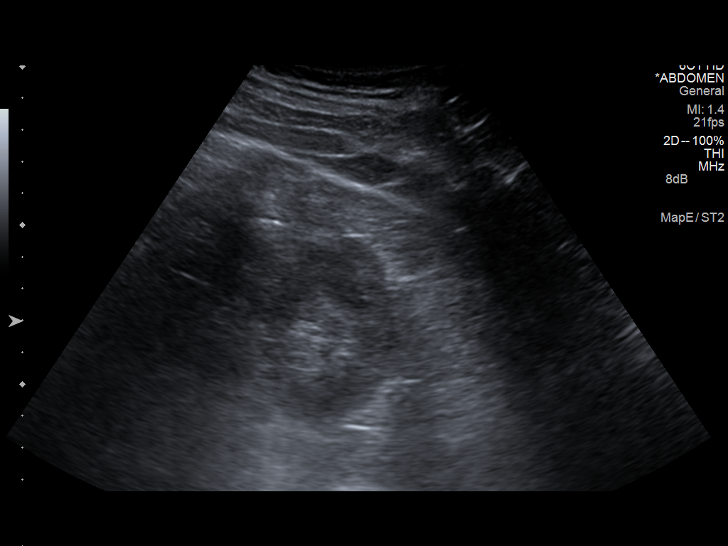
[im 78/85]
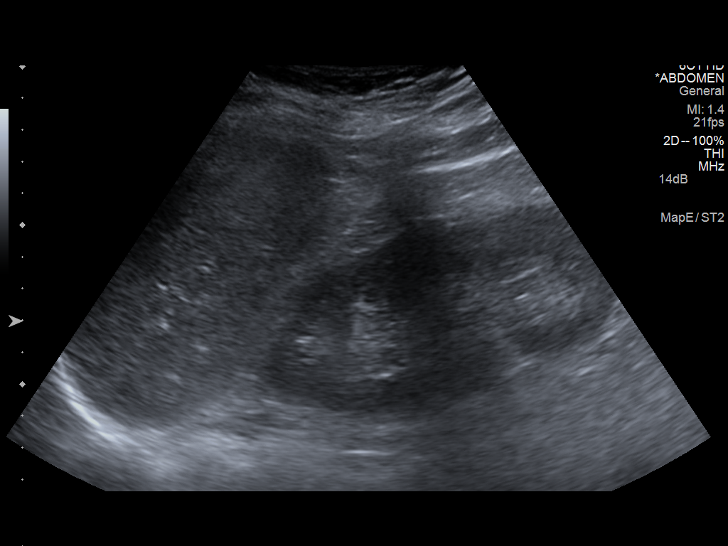
[im 85/85]
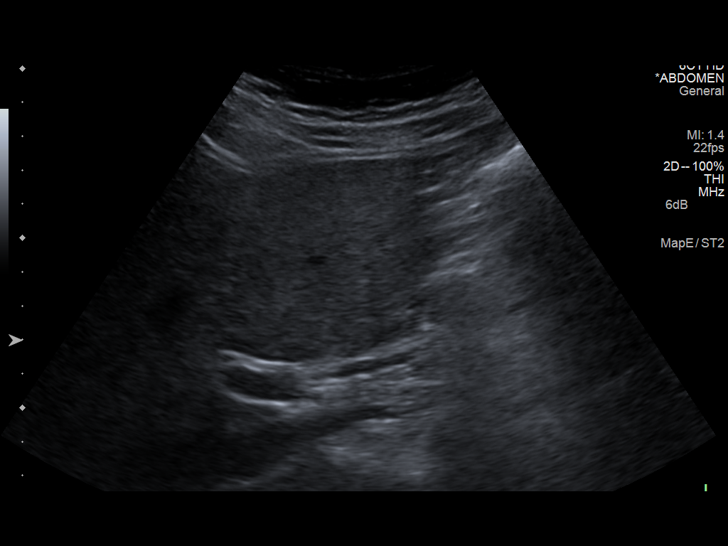

[14 of 25 positions shown; findings below may reference images not displayed]

FINDINGS: Gallbladder: No gallstones or wall thickening visualized. No
sonographic Murphy sign noted.

Common bile duct: Diameter: 0.4 cm.

Liver: The liver parenchyma is echogenic compared to the adjacent
right kidney. No focal liver lesion. No intrahepatic biliary
dilatation. Liver is prominent for size measuring up to 20.5 cm.

IVC: No abnormality visualized.

Pancreas: Visualized portion unremarkable.

Spleen: Size and appearance within normal limits.

Right Kidney: Length: 11.7 cm. Echogenicity within normal limits. No
mass or hydronephrosis visualized.

Left Kidney: Length: 11.5 cm. Echogenicity within normal limits. No
mass or hydronephrosis visualized.

Abdominal aorta: No aneurysm visualized.

Other findings: None.
IMPRESSION: No acute abnormality.

The liver parenchyma is echogenic and can be associated with hepatic
steatosis. No evidence for biliary dilatation.
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
## 2013-11-14 MED ORDER — DICLOFENAC SODIUM 75 MG PO TBEC: 75.0000 mg | DELAYED_RELEASE_TABLET | Freq: Two times a day (BID) | ORAL | Status: DC

## 2013-11-14 NOTE — Patient Instructions (Signed)
GO DIRECTLY FOR THE ULTRASOUND OF THE ABDOMEN. IF NEGATIVE TAKE THE DICLOFENAC AS DIRECTED.FOLLOWUP IN 3 DAYS
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
## 2013-11-15 ENCOUNTER — Telehealth: Payer: Self-pay

## 2013-11-15 NOTE — Telephone Encounter (Signed)
Pt's husband requesting phone call from Boris Lown, MD , did not go into specifics.

## 2013-11-16 NOTE — Telephone Encounter (Signed)
VM is full. Unable to leave message. Husband is listed on HIPPA.

## 2013-11-17 ENCOUNTER — Telehealth: Payer: Self-pay

## 2013-11-17 ENCOUNTER — Telehealth: Payer: Self-pay | Admitting: Radiology

## 2013-11-17 NOTE — Telephone Encounter (Signed)
Spoke with pt. There has been so much confusion today because her and her husband both keep calling. Asked pt to keep this in mind because there has been multiple phone messages and call backs. Re faxed labs to the new number.

## 2013-11-17 NOTE — Telephone Encounter (Signed)
Pt's husband calling about pt's radiology reports. Pt's husband notified.

## 2013-11-17 NOTE — Telephone Encounter (Signed)
Pt's husband called and wanted to give [place] another fax number to Dr Abel Presto office [phone]. I discussed it with Erin TL and she is contacting pt.

## 2013-11-17 NOTE — Telephone Encounter (Signed)
Herbie Baltimore the husband was concerned with t he results given to her regarding her cholesterol and liver. Would like a personal phone call back from [place] on this matter.    Please [phone]

## 2013-11-17 NOTE — Telephone Encounter (Signed)
Per pt, faxed latest labs (elevated WBC) to Dr. Florene Glen at [facility]. Fax number [phone]

## 2013-11-17 NOTE — Telephone Encounter (Signed)
I called to the patient and reassured her that she did not need to be on cholesterol medications her triglycerides are slightly elevated related to her weight and diabetes and she needs to work on diet and exercise.

## 2013-11-23 NOTE — Telephone Encounter (Signed)
Pt agreed to come in to see him next week. I gave her his walk in hrs and transferred to [facility] to see if there are any openings for appt. I have put the labs req'd back in Dr Perfecto Kingdom box.

## 2013-11-23 NOTE — Telephone Encounter (Signed)
Dr Everlene Farrier asked me to give pt a call and have her come in to have labs drawn that Dr Florene Glen faxed [place].

## 2013-11-25 ENCOUNTER — Other Ambulatory Visit: Payer: Self-pay

## 2013-11-25 NOTE — Telephone Encounter (Signed)
Pharm reqs RF of alprazolam. Pended. See notes under 11/17/13 ph message. Pt is planning to [facility] next week to see Dr Everlene Farrier for labs.

## 2013-11-26 MED ORDER — ALPRAZOLAM 0.5 MG PO TABS: 0.5000 mg | ORAL_TABLET | Freq: Every evening | ORAL | Status: DC | PRN

## 2013-11-28 ENCOUNTER — Telehealth: Payer: Self-pay | Admitting: Family Medicine

## 2013-11-28 ENCOUNTER — Other Ambulatory Visit (INDEPENDENT_AMBULATORY_CARE_PROVIDER_SITE_OTHER): Payer: Medicare HMO | Admitting: *Deleted

## 2013-11-28 ENCOUNTER — Other Ambulatory Visit: Payer: Self-pay | Admitting: Family Medicine

## 2013-11-28 DIAGNOSIS — E785 Hyperlipidemia, unspecified: Secondary | ICD-10-CM

## 2013-11-28 DIAGNOSIS — C8590 Non-Hodgkin lymphoma, unspecified, unspecified site: Secondary | ICD-10-CM

## 2013-11-28 DIAGNOSIS — E119 Type 2 diabetes mellitus without complications: Secondary | ICD-10-CM

## 2013-11-28 DIAGNOSIS — R300 Dysuria: Secondary | ICD-10-CM

## 2013-11-28 NOTE — Telephone Encounter (Signed)
Called pharm to verify that they received this Rx over the weekend.

## 2013-11-28 NOTE — Telephone Encounter (Signed)
Patient is coming in today for lab only visit. Orders placed per Dr. Everlene Farrier

## 2013-11-29 LAB — COMPLETE METABOLIC PANEL WITH GFR
ALT: 34 U/L (ref 0–35)
AST: 24 U/L (ref 0–37)
Albumin: 4.6 g/dL (ref 3.5–5.2)
Alkaline Phosphatase: 70 U/L (ref 39–117)
BILIRUBIN TOTAL: 0.4 mg/dL (ref 0.2–1.2)
BUN: 12 mg/dL (ref 6–23)
CALCIUM: 9.4 mg/dL (ref 8.4–10.5)
CO2: 25 meq/L (ref 19–32)
CREATININE: 0.85 mg/dL (ref 0.50–1.10)
Chloride: 102 mEq/L (ref 96–112)
GFR, EST AFRICAN AMERICAN: 84 mL/min
GFR, EST NON AFRICAN AMERICAN: 73 mL/min
Glucose, Bld: 85 mg/dL (ref 70–99)
Potassium: 4 mEq/L (ref 3.5–5.3)
Sodium: 138 mEq/L (ref 135–145)
Total Protein: 7.6 g/dL (ref 6.0–8.3)

## 2013-11-29 LAB — CBC WITH DIFFERENTIAL/PLATELET
Basophils Absolute: 0.1 10*3/uL (ref 0.0–0.1)
Basophils Relative: 1 % (ref 0–1)
EOS ABS: 0.1 10*3/uL (ref 0.0–0.7)
EOS PCT: 1 % (ref 0–5)
HCT: 38 % (ref 36.0–46.0)
Hemoglobin: 12.9 g/dL (ref 12.0–15.0)
Lymphocytes Relative: 47 % — ABNORMAL HIGH (ref 12–46)
Lymphs Abs: 5.5 10*3/uL — ABNORMAL HIGH (ref 0.7–4.0)
MCH: 29.6 pg (ref 26.0–34.0)
MCHC: 33.9 g/dL (ref 30.0–36.0)
MCV: 87.2 fL (ref 78.0–100.0)
MONOS PCT: 7 % (ref 3–12)
MPV: 9.8 fL (ref 9.4–12.4)
Monocytes Absolute: 0.8 10*3/uL (ref 0.1–1.0)
Neutro Abs: 5.1 10*3/uL (ref 1.7–7.7)
Neutrophils Relative %: 44 % (ref 43–77)
Platelets: 245 10*3/uL (ref 150–400)
RBC: 4.36 MIL/uL (ref 3.87–5.11)
RDW: 14.6 % (ref 11.5–15.5)
WBC: 11.6 10*3/uL — ABNORMAL HIGH (ref 4.0–10.5)

## 2013-11-29 LAB — LIPID PANEL
CHOLESTEROL: 186 mg/dL (ref 0–200)
HDL: 48 mg/dL (ref >39)
LDL Cholesterol: 76 mg/dL (ref 0–99)
Total CHOL/HDL Ratio: 3.9 Ratio
Triglycerides: 312 mg/dL — ABNORMAL HIGH (ref <150)
VLDL: 62 mg/dL — AB (ref 0–40)

## 2013-11-29 LAB — URINE CULTURE
COLONY COUNT: NO GROWTH
Organism ID, Bacteria: NO GROWTH

## 2013-11-29 LAB — HEMOGLOBIN A1C
Hgb A1c MFr Bld: 7 % — ABNORMAL HIGH (ref <5.7)
MEAN PLASMA GLUCOSE: 154 mg/dL — AB (ref <117)

## 2013-11-29 LAB — MICROALBUMIN, URINE: MICROALB UR: 0.2 mg/dL (ref <2.0)

## 2013-12-12 ENCOUNTER — Other Ambulatory Visit: Payer: Self-pay

## 2013-12-12 DIAGNOSIS — M25552 Pain in left hip: Secondary | ICD-10-CM

## 2013-12-12 DIAGNOSIS — M79642 Pain in left hand: Secondary | ICD-10-CM

## 2013-12-12 MED ORDER — DICLOFENAC SODIUM 75 MG PO TBEC: 75.0000 mg | DELAYED_RELEASE_TABLET | Freq: Two times a day (BID) | ORAL | Status: DC

## 2013-12-12 NOTE — Telephone Encounter (Signed)
Beth Stanley states the DR had given his wife some VOLTAREN 75MG  EC TABLETS and wanted to know if they worked, will give refills if needed and they would like to have some Called in. Please call [phone]     [place] AT [phone]

## 2013-12-27 LAB — HM DIABETES EYE EXAM

## 2014-01-03 ENCOUNTER — Telehealth: Payer: Self-pay | Admitting: Radiology

## 2014-01-03 ENCOUNTER — Telehealth: Payer: Self-pay | Admitting: *Deleted

## 2014-01-03 NOTE — Telephone Encounter (Signed)
Herbie Baltimore called to notify Dr. Everlene Farrier that pt has been eating a lot of ribs and cheese spreads. He is concerned that this would be the cause of pt increased triglycerides. Robert asked [place] to give pt a call to advise her that this is not a good diet. [phone]

## 2014-01-03 NOTE — Telephone Encounter (Signed)
Per Dr Everlene Farrier made appt for patient, she is on [employer] and the insurance company would like Dr Everlene Farrier to consider putting her on a statin.

## 2014-01-04 NOTE — Telephone Encounter (Signed)
Tried to reach pt at number provided. Work number she was in a meeting.

## 2014-01-09 ENCOUNTER — Ambulatory Visit (INDEPENDENT_AMBULATORY_CARE_PROVIDER_SITE_OTHER): Payer: Commercial Managed Care - HMO

## 2014-01-09 ENCOUNTER — Ambulatory Visit (INDEPENDENT_AMBULATORY_CARE_PROVIDER_SITE_OTHER): Payer: Commercial Managed Care - HMO | Admitting: Emergency Medicine

## 2014-01-09 VITALS — BP 134/82 | HR 92 | Temp 98.0°F | Resp 16 | Ht 62.0 in | Wt 154.2 lb

## 2014-01-09 DIAGNOSIS — J0140 Acute pansinusitis, unspecified: Secondary | ICD-10-CM

## 2014-01-09 DIAGNOSIS — M542 Cervicalgia: Secondary | ICD-10-CM

## 2014-01-09 DIAGNOSIS — M25552 Pain in left hip: Secondary | ICD-10-CM

## 2014-01-09 DIAGNOSIS — S20219A Contusion of unspecified front wall of thorax, initial encounter: Secondary | ICD-10-CM

## 2014-01-09 DIAGNOSIS — M79642 Pain in left hand: Secondary | ICD-10-CM

## 2014-01-09 DIAGNOSIS — J209 Acute bronchitis, unspecified: Secondary | ICD-10-CM
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
## 2014-01-09 MED ORDER — HYDROCOD POLST-CHLORPHEN POLST 10-8 MG/5ML PO LQCR: 5.0000 mL | Freq: Two times a day (BID) | ORAL | Status: DC | PRN

## 2014-01-09 MED ORDER — FLUCONAZOLE 150 MG PO TABS: 150.0000 mg | ORAL_TABLET | Freq: Once | ORAL | Status: DC

## 2014-01-09 MED ORDER — DICLOFENAC SODIUM 75 MG PO TBEC: 75.0000 mg | DELAYED_RELEASE_TABLET | Freq: Two times a day (BID) | ORAL | Status: DC

## 2014-01-09 MED ORDER — AMOXICILLIN-POT CLAVULANATE 875-125 MG PO TABS: 1.0000 | ORAL_TABLET | Freq: Two times a day (BID) | ORAL | Status: DC

## 2014-01-09 MED ORDER — ALBUTEROL SULFATE HFA 108 (90 BASE) MCG/ACT IN AERS: 2.0000 | INHALATION_SPRAY | RESPIRATORY_TRACT | Status: DC | PRN

## 2014-01-09 NOTE — Patient Instructions (Signed)
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
## 2014-01-16 ENCOUNTER — Telehealth: Payer: Self-pay
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
## 2014-01-20 ENCOUNTER — Other Ambulatory Visit: Payer: Self-pay

## 2014-01-20 MED ORDER — ALPRAZOLAM 0.5 MG PO TABS: 0.5000 mg | ORAL_TABLET | Freq: Every evening | ORAL | Status: DC | PRN

## 2014-01-20 NOTE — Telephone Encounter (Signed)
Faxed

## 2014-01-20 NOTE — Telephone Encounter (Signed)
Pharm faxed req for RF of alprazolam. Pt has appt on 01/24/14. Pended.

## 2014-01-24 ENCOUNTER — Ambulatory Visit (INDEPENDENT_AMBULATORY_CARE_PROVIDER_SITE_OTHER): Payer: Commercial Managed Care - HMO | Admitting: Emergency Medicine

## 2014-01-24 ENCOUNTER — Encounter: Payer: Self-pay | Admitting: Emergency Medicine

## 2014-01-24 VITALS — BP 121/80 | HR 71 | Temp 98.3°F | Resp 16 | Ht 63.0 in | Wt 152.0 lb

## 2014-01-24 DIAGNOSIS — K1379 Other lesions of oral mucosa: Secondary | ICD-10-CM

## 2014-01-24 DIAGNOSIS — E119 Type 2 diabetes mellitus without complications: Secondary | ICD-10-CM

## 2014-01-24 DIAGNOSIS — D72820 Lymphocytosis (symptomatic): Secondary | ICD-10-CM

## 2014-01-24 DIAGNOSIS — K130 Diseases of lips: Secondary | ICD-10-CM

## 2014-01-24 DIAGNOSIS — M542 Cervicalgia: Secondary | ICD-10-CM

## 2014-01-24 DIAGNOSIS — Z1212 Encounter for screening for malignant neoplasm of rectum: Secondary | ICD-10-CM

## 2014-01-24 DIAGNOSIS — F411 Generalized anxiety disorder: Secondary | ICD-10-CM

## 2014-01-24 DIAGNOSIS — Z1239 Encounter for other screening for malignant neoplasm of breast: Secondary | ICD-10-CM

## 2014-01-24 DIAGNOSIS — Z13820 Encounter for screening for osteoporosis: Secondary | ICD-10-CM

## 2014-01-24 DIAGNOSIS — Z8571 Personal history of Hodgkin lymphoma: Secondary | ICD-10-CM

## 2014-01-24 DIAGNOSIS — Z1211 Encounter for screening for malignant neoplasm of colon: Secondary | ICD-10-CM

## 2014-01-24 DIAGNOSIS — I10 Essential (primary) hypertension: Secondary | ICD-10-CM

## 2014-01-24 LAB — CBC WITH DIFFERENTIAL/PLATELET
BASOS ABS: 0.1 10*3/uL (ref 0.0–0.1)
Basophils Relative: 1 % (ref 0–1)
EOS PCT: 1 % (ref 0–5)
Eosinophils Absolute: 0.1 10*3/uL (ref 0.0–0.7)
HCT: 39 % (ref 36.0–46.0)
Hemoglobin: 13.1 g/dL (ref 12.0–15.0)
LYMPHS PCT: 35 % (ref 12–46)
Lymphs Abs: 3.3 10*3/uL (ref 0.7–4.0)
MCH: 29.8 pg (ref 26.0–34.0)
MCHC: 33.6 g/dL (ref 30.0–36.0)
MCV: 88.8 fL (ref 78.0–100.0)
MPV: 9.9 fL (ref 8.6–12.4)
Monocytes Absolute: 0.8 10*3/uL (ref 0.1–1.0)
Monocytes Relative: 8 % (ref 3–12)
NEUTROS ABS: 5.2 10*3/uL (ref 1.7–7.7)
Neutrophils Relative %: 55 % (ref 43–77)
Platelets: 253 10*3/uL (ref 150–400)
RBC: 4.39 MIL/uL (ref 3.87–5.11)
RDW: 13.9 % (ref 11.5–15.5)
WBC: 9.4 10*3/uL (ref 4.0–10.5)

## 2014-01-24 MED ORDER — ALPRAZOLAM 0.5 MG PO TABS: 0.5000 mg | ORAL_TABLET | Freq: Every evening | ORAL | Status: DC | PRN

## 2014-01-24 MED ORDER — VALACYCLOVIR HCL 1 G PO TABS: 500.0000 mg | ORAL_TABLET | Freq: Every day | ORAL | Status: DC
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
## 2014-01-24 NOTE — Patient Instructions (Signed)
We are looking at your blood work.  Continue to work on exercise regimen, diet, and hydration (64oz. Per day).  We will see you back in 4 months to recheck your glucose and lipids.

## 2014-01-25 LAB — PATHOLOGIST SMEAR REVIEW

## 2014-01-29 ENCOUNTER — Telehealth: Payer: Self-pay

## 2014-01-29 NOTE — Telephone Encounter (Signed)
Pt saw Dr. Everlene Farrier on 1/12 and put in an antibiotic. Pt states that she still has a cold in her chest and would like to know if there is anything else that she can be prescribed.

## 2014-01-29 NOTE — Telephone Encounter (Signed)
Returned patient's call. She saw Dr. Ouida Sills late Dec and treated with augmentin. Now has 3 days of cough,low grade fever, and yellow sputum. Will call in augmentin  To [facility]. [phone].. Advised RTC 48 hours if no better for CBC and CXR.

## 2014-01-29 NOTE — Telephone Encounter (Signed)
[facility] [address] in [place] for refill on antibiotics again.    [place]

## 2014-02-24 ENCOUNTER — Other Ambulatory Visit: Payer: Self-pay

## 2014-02-24 DIAGNOSIS — F411 Generalized anxiety disorder: Secondary | ICD-10-CM

## 2014-02-24 DIAGNOSIS — E119 Type 2 diabetes mellitus without complications: Secondary | ICD-10-CM

## 2014-02-24 DIAGNOSIS — F329 Major depressive disorder, single episode, unspecified: Secondary | ICD-10-CM

## 2014-02-24 DIAGNOSIS — F32A Depression, unspecified: Secondary | ICD-10-CM

## 2014-02-24 MED ORDER — METFORMIN HCL 500 MG PO TABS: 500.0000 mg | ORAL_TABLET | Freq: Two times a day (BID) | ORAL | Status: DC

## 2014-02-24 MED ORDER — SERTRALINE HCL 100 MG PO TABS: 100.0000 mg | ORAL_TABLET | Freq: Every day | ORAL | Status: DC

## 2014-02-24 MED ORDER — RAMIPRIL 5 MG PO CAPS: 5.0000 mg | ORAL_CAPSULE | Freq: Every day | ORAL | Status: DC

## 2014-02-24 NOTE — Telephone Encounter (Signed)
Pharm sent req for RF of alprazolam. Pended.

## 2014-02-24 NOTE — Telephone Encounter (Signed)
Also received other RF reqs. I RFd the ones I could per protocol, but wanted to check on omeprazole bc was not discussed in OV notes. Beth Maryland, do you want to RF it also?

## 2014-02-26 MED ORDER — ALPRAZOLAM 0.5 MG PO TABS: 0.5000 mg | ORAL_TABLET | Freq: Every evening | ORAL | Status: DC | PRN

## 2014-02-26 MED ORDER — OMEPRAZOLE 20 MG PO CPDR: 20.0000 mg | DELAYED_RELEASE_CAPSULE | Freq: Every day | ORAL | Status: DC

## 2014-02-26 NOTE — Telephone Encounter (Signed)
Prescription sent to pharmacy.

## 2014-02-27 NOTE — Telephone Encounter (Signed)
Called in alprazolam

## 2014-03-02 ENCOUNTER — Telehealth: Payer: Self-pay
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
## 2014-03-03 NOTE — Telephone Encounter (Signed)
I believe this is for Medical Records or Disabilities.

## 2014-03-14 ENCOUNTER — Other Ambulatory Visit: Payer: Self-pay

## 2014-03-14 DIAGNOSIS — G629 Polyneuropathy, unspecified: Secondary | ICD-10-CM

## 2014-03-14 MED ORDER — GABAPENTIN 300 MG PO CAPS
ORAL_CAPSULE | ORAL | Status: DC
Start: 2014-03-14 — End: 2014-10-03

## 2014-04-25 DIAGNOSIS — F17200 Nicotine dependence, unspecified, uncomplicated: Secondary | ICD-10-CM | POA: Insufficient documentation

## 2014-05-09 ENCOUNTER — Telehealth: Payer: Self-pay

## 2014-05-09 NOTE — Telephone Encounter (Signed)
At Dr. Perfecto Kingdom request, called patient to find out if pulmonary doctor at [facility] had put her on an antibiotic following her recent test results.  Patient states that they did not.  Advised patient to follow up with them regarding test results.  Patient requested that we give her the results, however, I instructed her that she needs to get those from her pulmonary doctor's office.  She stated that she would contact them.

## 2014-05-18 ENCOUNTER — Ambulatory Visit (INDEPENDENT_AMBULATORY_CARE_PROVIDER_SITE_OTHER): Payer: PPO

## 2014-05-18 ENCOUNTER — Ambulatory Visit (INDEPENDENT_AMBULATORY_CARE_PROVIDER_SITE_OTHER): Payer: PPO | Admitting: Emergency Medicine

## 2014-05-18 VITALS — BP 104/64 | HR 74 | Temp 98.4°F | Resp 16 | Ht 63.5 in | Wt 152.5 lb

## 2014-05-18 DIAGNOSIS — M5442 Lumbago with sciatica, left side: Secondary | ICD-10-CM

## 2014-05-18 DIAGNOSIS — C8590 Non-Hodgkin lymphoma, unspecified, unspecified site: Secondary | ICD-10-CM

## 2014-05-18 DIAGNOSIS — M5441 Lumbago with sciatica, right side: Secondary | ICD-10-CM | POA: Diagnosis not present

## 2014-05-18 DIAGNOSIS — M4726 Other spondylosis with radiculopathy, lumbar region: Secondary | ICD-10-CM

## 2014-05-18 DIAGNOSIS — Z0000 Encounter for general adult medical examination without abnormal findings: Secondary | ICD-10-CM | POA: Diagnosis not present

## 2014-05-18 IMAGING — CR DG LUMBAR SPINE 2-3V
3 series · 3 of 3 positions shown · non-contrast
Comparison: None.

CLINICAL DATA: Bilateral low back pain with sciatica.

EXAM:
LUMBAR SPINE - 2-3 VIEW

[AP]
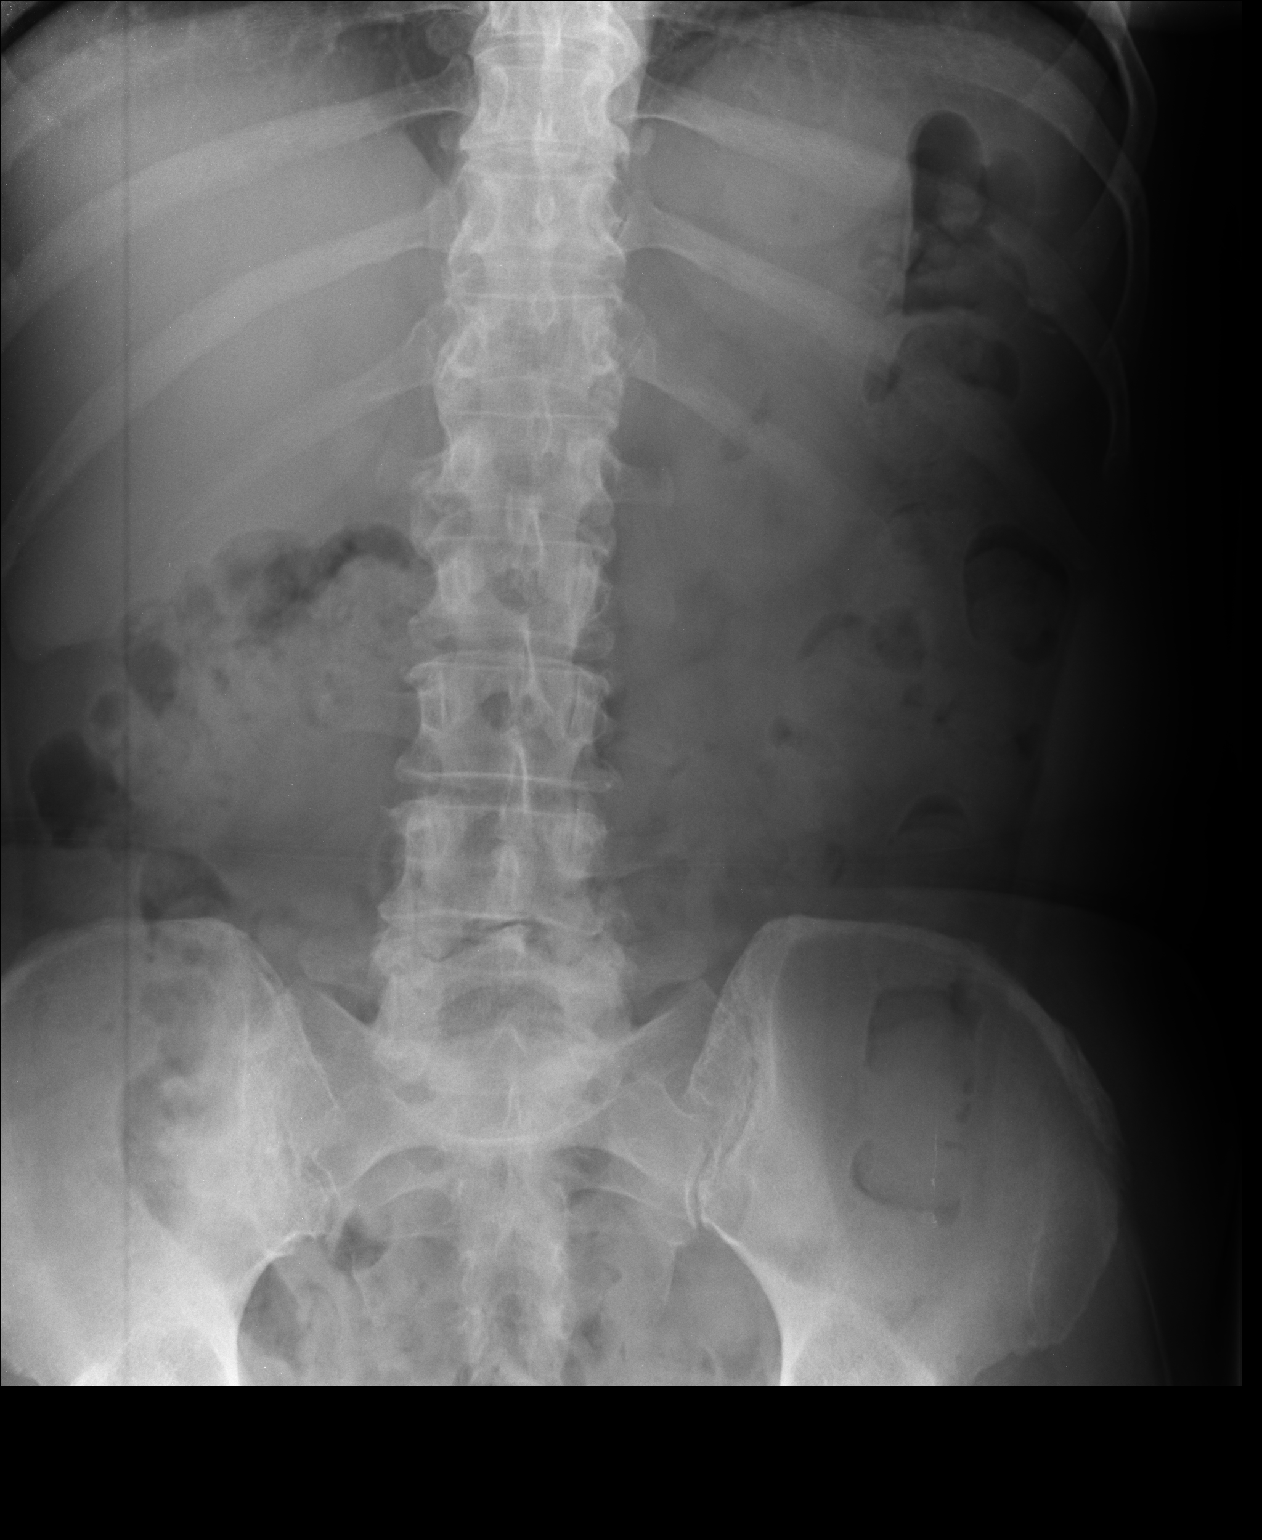

[l5 s1]
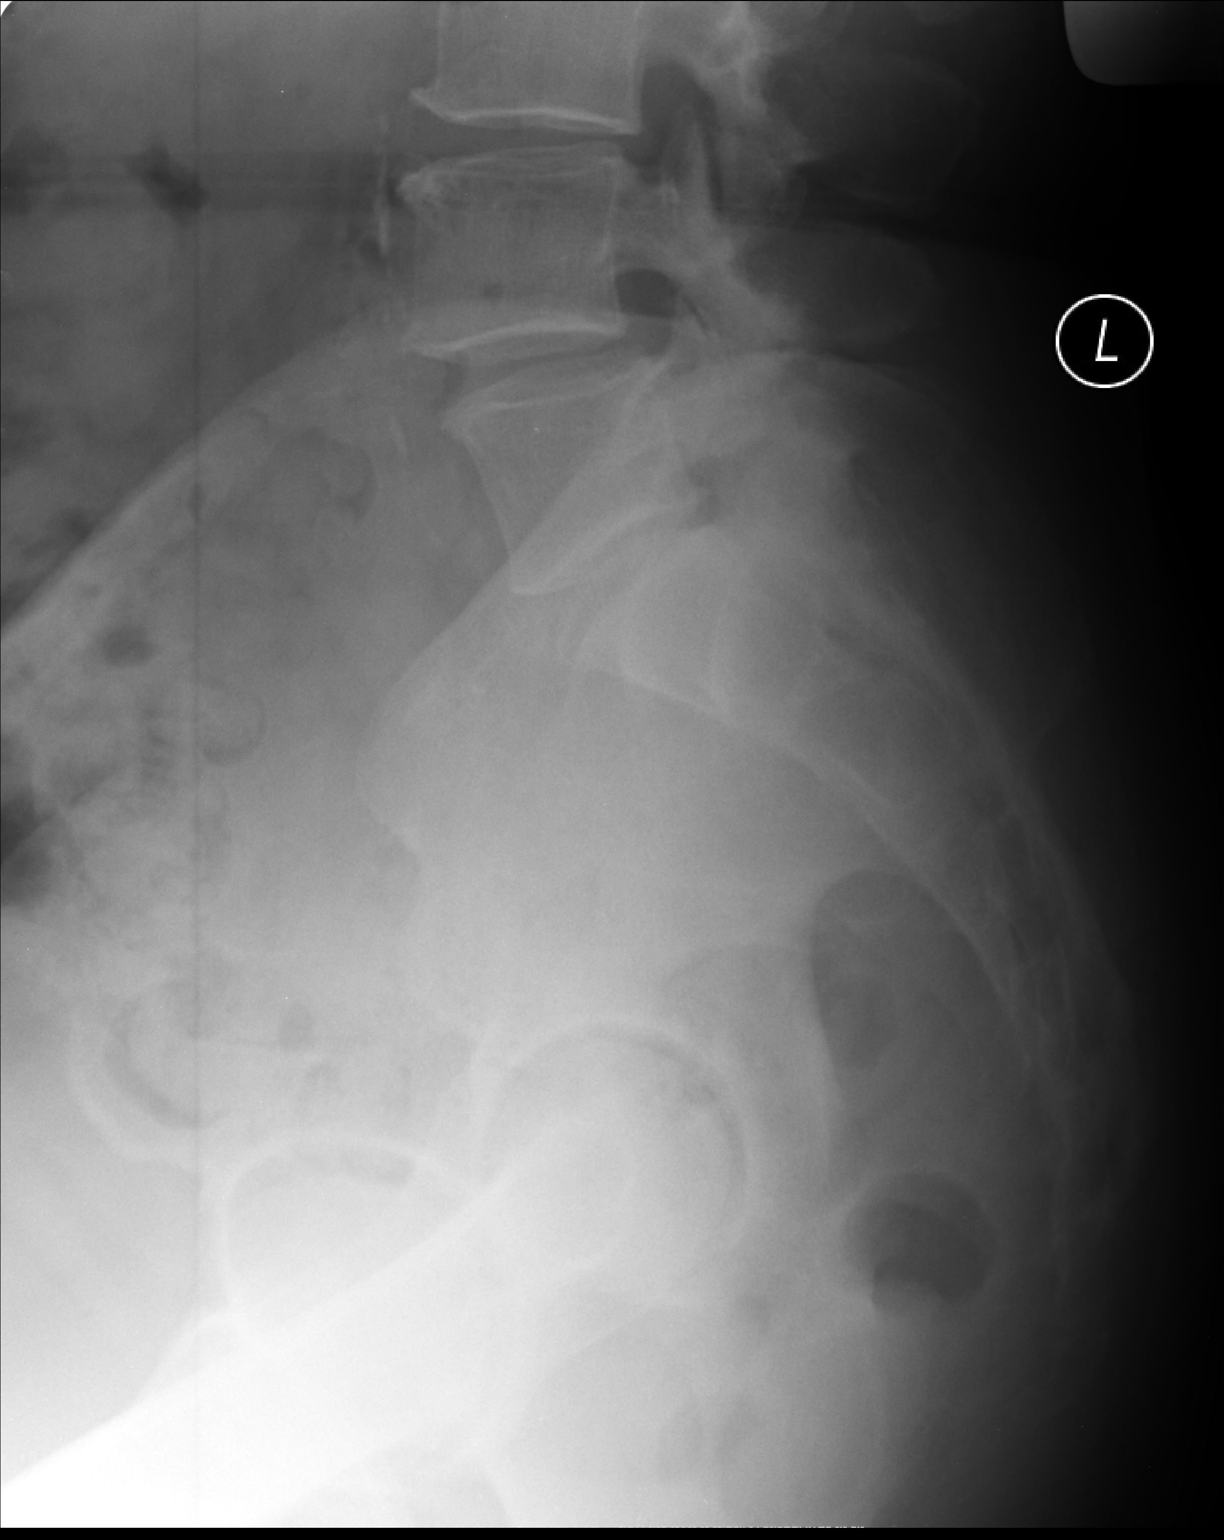

[lateral]
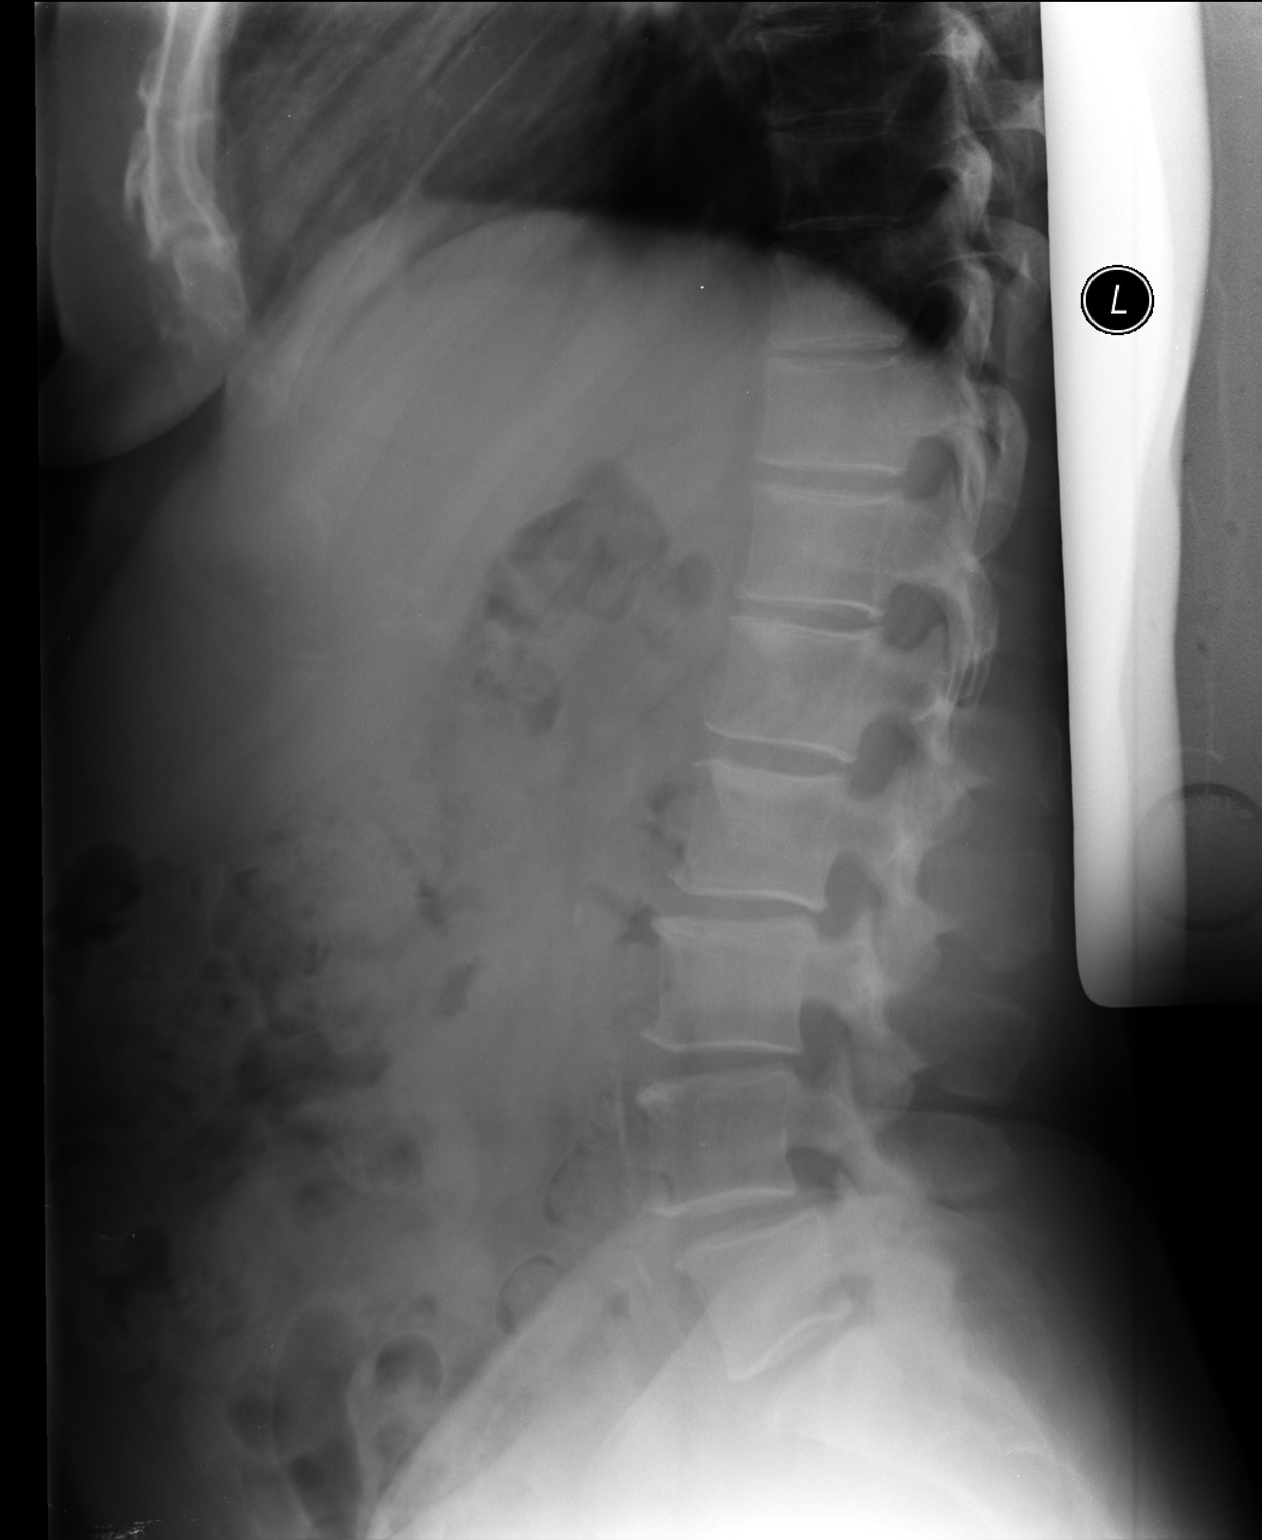

[3 of 3 positions shown; findings below may reference images not displayed]

FINDINGS: There are 5 nonrib bearing lumbar-type vertebral bodies.

The vertebral body heights are maintained.

There is no acute fracture. There is grade 1 anterolisthesis of L4
on L5 secondary to bilateral facet arthropathy. There is mild
degenerative disc disease of the lumbar spine. There is bilateral
facet arthropathy at L5-S1.

The SI joints are unremarkable.
IMPRESSION: 1. Mild lumbar spine spondylosis as described above.

## 2014-05-18 MED ORDER — METHYLPREDNISOLONE 4 MG PO TBPK: ORAL_TABLET | ORAL | Status: DC

## 2014-05-18 MED ORDER — CYCLOBENZAPRINE HCL 10 MG PO TABS
10.0000 mg | ORAL_TABLET | Freq: Three times a day (TID) | ORAL | Status: DC | PRN
Start: 2014-05-18 — End: 2015-06-27

## 2014-05-18 NOTE — Progress Notes (Signed)
Beth Stanley - 64 y.o. female MRN [id number]  Date of birth: 19-Sep-1950  SUBJECTIVE: CC: 1. Bilateral buttock pain, initial evaluation     HPI:  2 months of worsening buttock radiating into leg pain that is worse with ambulating and prolonged standing. Difficulty with bending and squatting.  Difficulty with ambulating and notices improvement while using a shopping cart if the store.  Pain does radiate down the posterior aspect of her leg into her heels  No changes in bowel or latter, no fevers, chills or recent weight gain or weight loss. No significant night sweats or nighttime awakenings. Difficulty with sleep onset.  Has tried taking different for the counter anti-inflammatories as well as gabapentin which seems to provide some relief but. 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 she does have pain with this     ROS:  per HPI    HISTORY:  Past Medical, Surgical, Social, and Family History reviewed & updated per EMR.  Pertinent Historical Findings include: Social History   Occupational History  . Not on file.   Social History Main Topics  . Smoking status: Current Every Day Smoker  . Smokeless tobacco: Never Used  . Alcohol Use: No  . Drug Use: No  . Sexual Activity: Not on file    No specialty comments available. Problem  Spondylosis of Lumbar Joint   L5-S1   Preventative Health Care  Lymphoma   History of non-Hodgkin's lymphoma, greater than 10 years of being in remission    History of depression, on treatment at this time reports being stable   OBJECTIVE:  VS:   HT:5' 3.5" (161.3 cm)   WT:152 lb 8 oz (69.174 kg)  BMI:26.6          BP:104/64 mmHg  HR:74bpm  TEMP:98.4 F (36.9 C)(Oral)  RESP:97 %  PHYSICAL EXAM: GENERAL:  adult Caucasian female. No acute distress  PSYCH: Alert and appropriately interactive.  SKIN: No open skin lesions  or abnormal skin markings on areas inspected as below  VASCULAR:  bilateral DP and PT pulses 2+/4. No significant pretibial edema.   NEURO: Lower extremity strength is 5+/5 in all myotomes; sensation is intact to light touch in all dermatomes. DTRs 2+/4 in bilateral patellar and right Achilles, 1+/4 and left Achilles. No significant clonus.   BACK: Overall well aligned no significant scoliosis. Limited lumbar flexion and side bending bilaterally. No significant midline tenderness. Bilateral straight leg raise with radiation to the bilateral heels. She is able to heel and toe walk without difficulty. BILATERAL HIPS: Overall somewhat limited internal rotation to 20 bilaterally but symmetric. No significant pain or grinding with axial loading and circumduction. Negative log roll, negative FADIR.   DATA OBTAINED: No notes on file Primary Read of Imaging by: Dr. Paulla Fore and Dr. Eddie Dibbles Lumbar Spine  05/18/2014  Findings:  degenerative changes L5-S1 most notably as well as a slight L4-L5 spondylolisthesis. Generalized spondylosis overall good disc space maintained the exception of L5-S1   Impression: The above findings are consistent with lumbar spondylosis with L5-S1 degenerative disc disease      ASSESSMENT & PLAN: See problem based charting & AVS for additional documentation Problem List Items Addressed This Visit    Lymphoma   Relevant Medications   methylPREDNISolone (MEDROL DOSEPAK) 4 MG TBPK tablet   Spondylosis of lumbar joint    No significant red flags on history or exam today.  Symptoms consistent with lumbar spinal stenosis most specifically at L5-S1. Steroid dose pack, muscle relaxers, gabapentin previously prescribed Gentle range of motion home exercise program provided If no significant improvement consider MRI of lumbar spine to evaluate for central canal stenosis at L5-S1 level. Discussed follow-up with Dr. Everlene Farrier, myself or another provider at [facility] and patient  provided my card at her request. Also consider referral to physical therapy if no significant improvement      Relevant Medications   cyclobenzaprine (FLEXERIL) 10 MG tablet   methylPREDNISolone (MEDROL DOSEPAK) 4 MG TBPK tablet   Preventative health care    Encouraged patient once again to follow-up & undergo screening colonoscopy for routine health maintenance as well as routine mammogram.  Pt voices understanding wand will touch base with her Gastroenterologist.  Follow-up with Dr. Everlene Farrier  In next 2-3 months for routine health maintenance visit       Other Visit Diagnoses    Bilateral low back pain with sciatica, sciatica laterality unspecified    -  Primary    Relevant Medications    cyclobenzaprine (FLEXERIL) 10 MG tablet    methylPREDNISolone (MEDROL DOSEPAK) 4 MG TBPK tablet    Other Relevant Orders    DG Lumbar Spine 2-3 Views      FOLLOW UP:  Return if symptoms worsen or fail to improve.

## 2014-05-18 NOTE — Progress Notes (Signed)
  Medical screening examination/treatment/procedure(s) were performed by non-physician practitioner and as supervising physician I was immediately available for consultation/collaboration.     

## 2014-05-18 NOTE — Assessment & Plan Note (Addendum)
Encouraged patient once again to follow-up & undergo screening colonoscopy for routine health maintenance as well as routine mammogram.  Pt voices understanding wand will touch base with her Gastroenterologist.  Follow-up with Dr. Everlene Farrier  In next 2-3 months for routine health maintenance visit

## 2014-05-18 NOTE — Patient Instructions (Signed)
Sciatica with Rehab The sciatic nerve runs from the back down the leg and is responsible for sensation and control of the muscles in the back (posterior) side of the thigh, lower leg, and foot. Sciatica is a condition that is characterized by inflammation of this nerve.  SYMPTOMS   Signs of nerve damage, including numbness and/or weakness along the posterior side of the lower extremity.  Pain in the back of the thigh that may also travel down the leg.  Pain that worsens when sitting for long periods of time.  Occasionally, pain in the back or buttock. CAUSES  Inflammation of the sciatic nerve is the cause of sciatica. The inflammation is due to something irritating the nerve. Common sources of irritation include:  Sitting for long periods of time.  Direct trauma to the nerve.  Arthritis of the spine.  Herniated or ruptured disk.  Slipping of the vertebrae (spondylolisthesis).  Pressure from soft tissues, such as muscles or ligament-like tissue (fascia). RISK INCREASES WITH:  Sports that place pressure or stress on the spine (football or weightlifting).  Poor strength and flexibility.  Failure to warm up properly before activity.  Family history of low back pain or disk disorders.  Previous back injury or surgery.  Poor body mechanics, especially when lifting, or poor posture. PREVENTION   Warm up and stretch properly before activity.  Maintain physical fitness:  Strength, flexibility, and endurance.  Cardiovascular fitness.  Learn and use proper technique, especially with posture and lifting. When possible, have coach correct improper technique.  Avoid activities that place stress on the spine. PROGNOSIS If treated properly, then sciatica usually resolves within 6 weeks. However, occasionally surgery is necessary.  RELATED COMPLICATIONS   Permanent nerve damage, including pain, numbness, tingle, or weakness.  Chronic back pain.  Risks of surgery: infection,  bleeding, nerve damage, or damage to surrounding tissues. TREATMENT Treatment initially involves resting from any activities that aggravate your symptoms. The use of ice and medication may help reduce pain and inflammation. The use of strengthening and stretching exercises may help reduce pain with activity. These exercises may be performed at home or with referral to a therapist. A therapist may recommend further treatments, such as transcutaneous electronic nerve stimulation (TENS) or ultrasound. Your caregiver may recommend corticosteroid injections to help reduce inflammation of the sciatic nerve. If symptoms persist despite non-surgical (conservative) treatment, then surgery may be recommended. MEDICATION  If pain medication is necessary, then nonsteroidal anti-inflammatory medications, such as aspirin and ibuprofen, or other minor pain relievers, such as acetaminophen, are often recommended.  Do not take pain medication for 7 days before surgery.  Prescription pain relievers may be given if deemed necessary by your caregiver. Use only as directed and only as much as you need.  Ointments applied to the skin may be helpful.  Corticosteroid injections may be given by your caregiver. These injections should be reserved for the most serious cases, because they may only be given a certain number of times. HEAT AND COLD  Cold treatment (icing) relieves pain and reduces inflammation. Cold treatment should be applied for 10 to 15 minutes every 2 to 3 hours for inflammation and pain and immediately after any activity that aggravates your symptoms. Use ice packs or massage the area with a piece of ice (ice massage).  Heat treatment may be used prior to performing the stretching and strengthening activities prescribed by your caregiver, physical therapist, or athletic trainer. Use a heat pack or soak the injury in warm water.   SEEK MEDICAL CARE IF:  Treatment seems to offer no benefit, or the condition  worsens.  Any medications produce adverse side effects. EXERCISES  RANGE OF MOTION (ROM) AND STRETCHING EXERCISES - Sciatica Most people with sciatic will find that their symptoms worsen with either excessive bending forward (flexion) or arching at the low back (extension). The exercises which will help resolve your symptoms will focus on the opposite motion. Your physician, physical therapist or athletic trainer will help you determine which exercises will be most helpful to resolve your low back pain. Do not complete any exercises without first consulting with your clinician. Discontinue any exercises which worsen your symptoms until you speak to your clinician. If you have pain, numbness or tingling which travels down into your buttocks, leg or foot, the goal of the therapy is for these symptoms to move closer to your back and eventually resolve. Occasionally, these leg symptoms will get better, but your low back pain may worsen; this is typically an indication of progress in your rehabilitation. Be certain to be very alert to any changes in your symptoms and the activities in which you participated in the 24 hours prior to the change. Sharing this information with your clinician will allow him/her to most efficiently treat your condition. These exercises may help you when beginning to rehabilitate your injury. Your symptoms may resolve with or without further involvement from your physician, physical therapist or athletic trainer. While completing these exercises, remember:   Restoring tissue flexibility helps normal motion to return to the joints. This allows healthier, less painful movement and activity.  An effective stretch should be held for at least 30 seconds.  A stretch should never be painful. You should only feel a gentle lengthening or release in the stretched tissue. FLEXION RANGE OF MOTION AND STRETCHING EXERCISES: STRETCH - Flexion, Single Knee to Chest   Lie on a firm bed or floor  with both legs extended in front of you.  Keeping one leg in contact with the floor, bring your opposite knee to your chest. Hold your leg in place by either grabbing behind your thigh or at your knee.  Pull until you feel a gentle stretch in your low back. Hold __________ seconds.  Slowly release your grasp and repeat the exercise with the opposite side. Repeat __________ times. Complete this exercise __________ times per day.  STRETCH - Flexion, Double Knee to Chest  Lie on a firm bed or floor with both legs extended in front of you.  Keeping one leg in contact with the floor, bring your opposite knee to your chest.  Tense your stomach muscles to support your back and then lift your other knee to your chest. Hold your legs in place by either grabbing behind your thighs or at your knees.  Pull both knees toward your chest until you feel a gentle stretch in your low back. Hold __________ seconds.  Tense your stomach muscles and slowly return one leg at a time to the floor. Repeat __________ times. Complete this exercise __________ times per day.  STRETCH - Low Trunk Rotation   Lie on a firm bed or floor. Keeping your legs in front of you, bend your knees so they are both pointed toward the ceiling and your feet are flat on the floor.  Extend your arms out to the side. This will stabilize your upper body by keeping your shoulders in contact with the floor.  Gently and slowly drop both knees together to one side until   you feel a gentle stretch in your low back. Hold for __________ seconds.  Tense your stomach muscles to support your low back as you bring your knees back to the starting position. Repeat the exercise to the other side. Repeat __________ times. Complete this exercise __________ times per day  EXTENSION RANGE OF MOTION AND FLEXIBILITY EXERCISES: STRETCH - Extension, Prone on Elbows  Lie on your stomach on the floor, a bed will be too soft. Place your palms about shoulder  width apart and at the height of your head.  Place your elbows under your shoulders. If this is too painful, stack pillows under your chest.  Allow your body to relax so that your hips drop lower and make contact more completely with the floor.  Hold this position for __________ seconds.  Slowly return to lying flat on the floor. Repeat __________ times. Complete this exercise __________ times per day.  RANGE OF MOTION - Extension, Prone Press Ups  Lie on your stomach on the floor, a bed will be too soft. Place your palms about shoulder width apart and at the height of your head.  Keeping your back as relaxed as possible, slowly straighten your elbows while keeping your hips on the floor. You may adjust the placement of your hands to maximize your comfort. As you gain motion, your hands will come more underneath your shoulders.  Hold this position __________ seconds.  Slowly return to lying flat on the floor. Repeat __________ times. Complete this exercise __________ times per day.  STRENGTHENING EXERCISES - Sciatica  These exercises may help you when beginning to rehabilitate your injury. These exercises should be done near your "sweet spot." This is the neutral, low-back arch, somewhere between fully rounded and fully arched, that is your least painful position. When performed in this safe range of motion, these exercises can be used for people who have either a flexion or extension based injury. These exercises may resolve your symptoms with or without further involvement from your physician, physical therapist or athletic trainer. While completing these exercises, remember:   Muscles can gain both the endurance and the strength needed for everyday activities through controlled exercises.  Complete these exercises as instructed by your physician, physical therapist or athletic trainer. Progress with the resistance and repetition exercises only as your caregiver advises.  You may  experience muscle soreness or fatigue, but the pain or discomfort you are trying to eliminate should never worsen during these exercises. If this pain does worsen, stop and make certain you are following the directions exactly. If the pain is still present after adjustments, discontinue the exercise until you can discuss the trouble with your clinician. STRENGTHENING - Deep Abdominals, Pelvic Tilt   Lie on a firm bed or floor. Keeping your legs in front of you, bend your knees so they are both pointed toward the ceiling and your feet are flat on the floor.  Tense your lower abdominal muscles to press your low back into the floor. This motion will rotate your pelvis so that your tail bone is scooping upwards rather than pointing at your feet or into the floor.  With a gentle tension and even breathing, hold this position for __________ seconds. Repeat __________ times. Complete this exercise __________ times per day.  STRENGTHENING - Abdominals, Crunches   Lie on a firm bed or floor. Keeping your legs in front of you, bend your knees so they are both pointed toward the ceiling and your feet are flat on the   floor. Cross your arms over your chest.  Slightly tip your chin down without bending your neck.  Tense your abdominals and slowly lift your trunk high enough to just clear your shoulder blades. Lifting higher can put excessive stress on the low back and does not further strengthen your abdominal muscles.  Control your return to the starting position. Repeat __________ times. Complete this exercise __________ times per day.  STRENGTHENING - Quadruped, Opposite UE/LE Lift  Assume a hands and knees position on a firm surface. Keep your hands under your shoulders and your knees under your hips. You may place padding under your knees for comfort.  Find your neutral spine and gently tense your abdominal muscles so that you can maintain this position. Your shoulders and hips should form a rectangle  that is parallel with the floor and is not twisted.  Keeping your trunk steady, lift your right hand no higher than your shoulder and then your left leg no higher than your hip. Make sure you are not holding your breath. Hold this position __________ seconds.  Continuing to keep your abdominal muscles tense and your back steady, slowly return to your starting position. Repeat with the opposite arm and leg. Repeat __________ times. Complete this exercise __________ times per day.  STRENGTHENING - Abdominals and Quadriceps, Straight Leg Raise   Lie on a firm bed or floor with both legs extended in front of you.  Keeping one leg in contact with the floor, bend the other knee so that your foot can rest flat on the floor.  Find your neutral spine, and tense your abdominal muscles to maintain your spinal position throughout the exercise.  Slowly lift your straight leg off the floor about 6 inches for a count of 15, making sure to not hold your breath.  Still keeping your neutral spine, slowly lower your leg all the way to the floor. Repeat this exercise with each leg __________ times. Complete this exercise __________ times per day. POSTURE AND BODY MECHANICS CONSIDERATIONS - Sciatica Keeping correct posture when sitting, standing or completing your activities will reduce the stress put on different body tissues, allowing injured tissues a chance to heal and limiting painful experiences. The following are general guidelines for improved posture. Your physician or physical therapist will provide you with any instructions specific to your needs. While reading these guidelines, remember:  The exercises prescribed by your provider will help you have the flexibility and strength to maintain correct postures.  The correct posture provides the optimal environment for your joints to work. All of your joints have less wear and tear when properly supported by a spine with good posture. This means you will  experience a healthier, less painful body.  Correct posture must be practiced with all of your activities, especially prolonged sitting and standing. Correct posture is as important when doing repetitive low-stress activities (typing) as it is when doing a single heavy-load activity (lifting). RESTING POSITIONS Consider which positions are most painful for you when choosing a resting position. If you have pain with flexion-based activities (sitting, bending, stooping, squatting), choose a position that allows you to rest in a less flexed posture. You would want to avoid curling into a fetal position on your side. If your pain worsens with extension-based activities (prolonged standing, working overhead), avoid resting in an extended position such as sleeping on your stomach. Most people will find more comfort when they rest with their spine in a more neutral position, neither too rounded nor too   arched. Lying on a non-sagging bed on your side with a pillow between your knees, or on your back with a pillow under your knees will often provide some relief. Keep in mind, being in any one position for a prolonged period of time, no matter how correct your posture, can still lead to stiffness. PROPER SITTING POSTURE In order to minimize stress and discomfort on your spine, you must sit with correct posture Sitting with good posture should be effortless for a healthy body. Returning to good posture is a gradual process. Many people can work toward this most comfortably by using various supports until they have the flexibility and strength to maintain this posture on their own. When sitting with proper posture, your ears will fall over your shoulders and your shoulders will fall over your hips. You should use the back of the chair to support your upper back. Your low back will be in a neutral position, just slightly arched. You may place a small pillow or folded towel at the base of your low back for support.  When  working at a desk, create an environment that supports good, upright posture. Without extra support, muscles fatigue and lead to excessive strain on joints and other tissues. Keep these recommendations in mind: CHAIR:   A chair should be able to slide under your desk when your back makes contact with the back of the chair. This allows you to work closely.  The chair's height should allow your eyes to be level with the upper part of your monitor and your hands to be slightly lower than your elbows. BODY POSITION  Your feet should make contact with the floor. If this is not possible, use a foot rest.  Keep your ears over your shoulders. This will reduce stress on your neck and low back. INCORRECT SITTING POSTURES   If you are feeling tired and unable to assume a healthy sitting posture, do not slouch or slump. This puts excessive strain on your back tissues, causing more damage and pain. Healthier options include:  Using more support, like a lumbar pillow.  Switching tasks to something that requires you to be upright or walking.  Talking a brief walk.  Lying down to rest in a neutral-spine position. PROLONGED STANDING WHILE SLIGHTLY LEANING FORWARD  When completing a task that requires you to lean forward while standing in one place for a long time, place either foot up on a stationary 2-4 inch high object to help maintain the best posture. When both feet are on the ground, the low back tends to lose its slight inward curve. If this curve flattens (or becomes too large), then the back and your other joints will experience too much stress, fatigue more quickly and can cause pain.  CORRECT STANDING POSTURES Proper standing posture should be assumed with all daily activities, even if they only take a few moments, like when brushing your teeth. As in sitting, your ears should fall over your shoulders and your shoulders should fall over your hips. You should keep a slight tension in your abdominal  muscles to brace your spine. Your tailbone should point down to the ground, not behind your body, resulting in an over-extended swayback posture.  INCORRECT STANDING POSTURES  Common incorrect standing postures include a forward head, locked knees and/or an excessive swayback. WALKING Walk with an upright posture. Your ears, shoulders and hips should all line-up. PROLONGED ACTIVITY IN A FLEXED POSITION When completing a task that requires you to bend forward   at your waist or lean over a low surface, try to find a way to stabilize 3 of 4 of your limbs. You can place a hand or elbow on your thigh or rest a knee on the surface you are reaching across. This will provide you more stability so that your muscles do not fatigue as quickly. By keeping your knees relaxed, or slightly bent, you will also reduce stress across your low back. CORRECT LIFTING TECHNIQUES DO :   Assume a wide stance. This will provide you more stability and the opportunity to get as close as possible to the object which you are lifting.  Tense your abdominals to brace your spine; then bend at the knees and hips. Keeping your back locked in a neutral-spine position, lift using your leg muscles. Lift with your legs, keeping your back straight.  Test the weight of unknown objects before attempting to lift them.  Try to keep your elbows locked down at your sides in order get the best strength from your shoulders when carrying an object.  Always ask for help when lifting heavy or awkward objects. INCORRECT LIFTING TECHNIQUES DO NOT:   Lock your knees when lifting, even if it is a small object.  Bend and twist. Pivot at your feet or move your feet when needing to change directions.  Assume that you cannot safely pick up a paperclip without proper posture. Document Released: 12/30/2004 Document Revised: 05/16/2013 Document Reviewed: 04/13/2008 [employer] Patient Information 2015 [employer]. This information is not intended to  replace advice given to you by your health care provider. Make sure you discuss any questions you have with your health care provider.  

## 2014-05-18 NOTE — Assessment & Plan Note (Signed)
No significant red flags on history or exam today. Symptoms consistent with lumbar spinal stenosis most specifically at L5-S1. Steroid dose pack, muscle relaxers, gabapentin previously prescribed Gentle range of motion home exercise program provided If no significant improvement consider MRI of lumbar spine to evaluate for central canal stenosis at L5-S1 level. Discussed follow-up with Dr. Everlene Farrier, myself or another provider at [facility] and patient provided my card at her request. Also consider referral to physical therapy if no significant improvement

## 2014-05-23 ENCOUNTER — Other Ambulatory Visit: Payer: Self-pay

## 2014-05-23 ENCOUNTER — Telehealth: Payer: Self-pay

## 2014-05-23 DIAGNOSIS — E119 Type 2 diabetes mellitus without complications: Secondary | ICD-10-CM

## 2014-05-23 MED ORDER — OMEPRAZOLE 20 MG PO CPDR: 20.0000 mg | DELAYED_RELEASE_CAPSULE | Freq: Every day | ORAL | Status: DC

## 2014-05-23 MED ORDER — METFORMIN HCL 500 MG PO TABS: 500.0000 mg | ORAL_TABLET | Freq: Three times a day (TID) | ORAL | Status: DC

## 2014-05-23 MED ORDER — METFORMIN HCL 500 MG PO TABS: 500.0000 mg | ORAL_TABLET | Freq: Two times a day (BID) | ORAL | Status: DC

## 2014-05-23 NOTE — Telephone Encounter (Signed)
Refill on Metformin. Sent to pharm.
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
## 2014-06-15 ENCOUNTER — Telehealth: Payer: Self-pay

## 2014-06-15 NOTE — Telephone Encounter (Signed)
Patient's husband is calling tp request a refill for gabapentin sent to [facility].

## 2014-06-16 NOTE — Telephone Encounter (Signed)
Spoke with pt's husband and let him know that there were 4 refills on the original Rx.

## 2014-06-20 ENCOUNTER — Other Ambulatory Visit: Payer: Self-pay | Admitting: Family Medicine

## 2014-06-22 NOTE — Telephone Encounter (Signed)
Faxed

## 2014-07-06 ENCOUNTER — Other Ambulatory Visit: Payer: Self-pay

## 2014-07-06 MED ORDER — GLUCOSE BLOOD VI STRP: ORAL_STRIP | Status: DC

## 2014-07-06 MED ORDER — BLOOD GLUCOSE MONITOR KIT: PACK | Status: DC

## 2014-07-11 ENCOUNTER — Telehealth: Payer: Self-pay

## 2014-07-11 NOTE — Telephone Encounter (Signed)
Patient's husband Beth Stanley is requesting to speak with a clinical staff. He states his wife was diagnosis with non-hodgkins lymphoma B cell stage 4 on 01/05/2002. He states she is complaining of salty taste and some pain throughout her body. Please call spouse robert back at [phone], he has questions.

## 2014-07-12 NOTE — Telephone Encounter (Signed)
Unable to leave message on number given to call back.

## 2014-08-01 ENCOUNTER — Other Ambulatory Visit: Payer: Self-pay | Admitting: Family Medicine

## 2014-08-01 NOTE — Telephone Encounter (Signed)
Appears to be patient of Dr. Everlene Farrier; will route to [facility].

## 2014-08-18 ENCOUNTER — Other Ambulatory Visit: Payer: Self-pay | Admitting: Emergency Medicine

## 2014-08-22 ENCOUNTER — Other Ambulatory Visit: Payer: Self-pay

## 2014-08-22 DIAGNOSIS — F329 Major depressive disorder, single episode, unspecified: Secondary | ICD-10-CM

## 2014-08-22 DIAGNOSIS — F32A Depression, unspecified: Secondary | ICD-10-CM

## 2014-08-22 MED ORDER — RAMIPRIL 5 MG PO CAPS
5.0000 mg | ORAL_CAPSULE | Freq: Every day | ORAL | Status: DC
Start: 2014-08-22 — End: 2014-09-26

## 2014-08-22 MED ORDER — SERTRALINE HCL 100 MG PO TABS: 100.0000 mg | ORAL_TABLET | Freq: Every day | ORAL | Status: DC

## 2014-08-24 ENCOUNTER — Telehealth: Payer: Self-pay

## 2014-08-24 NOTE — Telephone Encounter (Signed)
Husband LM on my VM asking me to call him to discuss pt's RFs and need to RTC for f/up. Tried to call # he left and couldn't get through. I called pt's cell # and clarified that we approved the RF but just put a note on it that she needs OV for more RFs. She advised that the pharm must have misunderstood because they wouldn't give them the RFs. Called pharm and they advised that husband p/up both RFs yesterday, so maybe the issue was that she normally gets a 90 day supply. Pharm reported that they do not get a significant price break for 90 days, so shouldn't make a big difference.

## 2014-09-15 ENCOUNTER — Other Ambulatory Visit: Payer: Self-pay

## 2014-09-15 DIAGNOSIS — Z1231 Encounter for screening mammogram for malignant neoplasm of breast: Secondary | ICD-10-CM

## 2014-09-26 ENCOUNTER — Other Ambulatory Visit: Payer: Self-pay

## 2014-09-26 ENCOUNTER — Ambulatory Visit: Payer: Commercial Managed Care - HMO

## 2014-09-26 MED ORDER — RAMIPRIL 5 MG PO CAPS: 5.0000 mg | ORAL_CAPSULE | Freq: Every day | ORAL | Status: DC

## 2014-10-03 ENCOUNTER — Ambulatory Visit (INDEPENDENT_AMBULATORY_CARE_PROVIDER_SITE_OTHER): Payer: PPO | Admitting: Emergency Medicine

## 2014-10-03 ENCOUNTER — Ambulatory Visit: Payer: Commercial Managed Care - HMO

## 2014-10-03 ENCOUNTER — Encounter: Payer: Self-pay | Admitting: Emergency Medicine

## 2014-10-03 VITALS — BP 118/79 | HR 70 | Temp 98.3°F | Resp 16 | Ht 64.0 in | Wt 146.0 lb

## 2014-10-03 DIAGNOSIS — E119 Type 2 diabetes mellitus without complications: Secondary | ICD-10-CM

## 2014-10-03 DIAGNOSIS — J4531 Mild persistent asthma with (acute) exacerbation: Secondary | ICD-10-CM

## 2014-10-03 DIAGNOSIS — G629 Polyneuropathy, unspecified: Secondary | ICD-10-CM

## 2014-10-03 DIAGNOSIS — Z23 Encounter for immunization: Secondary | ICD-10-CM

## 2014-10-03 DIAGNOSIS — F329 Major depressive disorder, single episode, unspecified: Secondary | ICD-10-CM

## 2014-10-03 DIAGNOSIS — Z114 Encounter for screening for human immunodeficiency virus [HIV]: Secondary | ICD-10-CM

## 2014-10-03 DIAGNOSIS — M5489 Other dorsalgia: Secondary | ICD-10-CM

## 2014-10-03 DIAGNOSIS — F32A Depression, unspecified: Secondary | ICD-10-CM

## 2014-10-03 DIAGNOSIS — I10 Essential (primary) hypertension: Secondary | ICD-10-CM | POA: Diagnosis not present

## 2014-10-03 DIAGNOSIS — G63 Polyneuropathy in diseases classified elsewhere: Secondary | ICD-10-CM

## 2014-10-03 DIAGNOSIS — C8589 Other specified types of non-Hodgkin lymphoma, extranodal and solid organ sites: Secondary | ICD-10-CM

## 2014-10-03 DIAGNOSIS — M542 Cervicalgia: Secondary | ICD-10-CM | POA: Diagnosis not present

## 2014-10-03 DIAGNOSIS — Z1159 Encounter for screening for other viral diseases: Secondary | ICD-10-CM | POA: Diagnosis not present

## 2014-10-03 DIAGNOSIS — E785 Hyperlipidemia, unspecified: Secondary | ICD-10-CM | POA: Diagnosis not present

## 2014-10-03 DIAGNOSIS — C8590 Non-Hodgkin lymphoma, unspecified, unspecified site: Secondary | ICD-10-CM

## 2014-10-03 DIAGNOSIS — B009 Herpesviral infection, unspecified: Secondary | ICD-10-CM

## 2014-10-03 DIAGNOSIS — R112 Nausea with vomiting, unspecified: Secondary | ICD-10-CM

## 2014-10-03 LAB — CBC WITH DIFFERENTIAL/PLATELET
Basophils Absolute: 0.1 10*3/uL (ref 0.0–0.1)
Basophils Relative: 1 % (ref 0–1)
Eosinophils Absolute: 0.1 10*3/uL (ref 0.0–0.7)
Eosinophils Relative: 1 % (ref 0–5)
HCT: 36.6 % (ref 36.0–46.0)
Hemoglobin: 12.6 g/dL (ref 12.0–15.0)
LYMPHS ABS: 3.5 10*3/uL (ref 0.7–4.0)
LYMPHS PCT: 42 % (ref 12–46)
MCH: 30.1 pg (ref 26.0–34.0)
MCHC: 34.4 g/dL (ref 30.0–36.0)
MCV: 87.4 fL (ref 78.0–100.0)
MPV: 9.9 fL (ref 8.6–12.4)
Monocytes Absolute: 0.8 10*3/uL (ref 0.1–1.0)
Monocytes Relative: 10 % (ref 3–12)
NEUTROS ABS: 3.8 10*3/uL (ref 1.7–7.7)
Neutrophils Relative %: 46 % (ref 43–77)
Platelets: 237 10*3/uL (ref 150–400)
RBC: 4.19 MIL/uL (ref 3.87–5.11)
RDW: 14.6 % (ref 11.5–15.5)
WBC: 8.3 10*3/uL (ref 4.0–10.5)

## 2014-10-03 LAB — POCT GLYCOSYLATED HEMOGLOBIN (HGB A1C): Hemoglobin A1C: 6.7

## 2014-10-03 LAB — GLUCOSE, POCT (MANUAL RESULT ENTRY): POC Glucose: 111 mg/dL — AB (ref 70–99)

## 2014-10-03 MED ORDER — DICLOFENAC SODIUM 1 % TD GEL: 2.0000 g | Freq: Two times a day (BID) | TRANSDERMAL | Status: DC | PRN

## 2014-10-03 MED ORDER — RAMIPRIL 5 MG PO CAPS: 5.0000 mg | ORAL_CAPSULE | Freq: Every day | ORAL | Status: DC

## 2014-10-03 MED ORDER — ALBUTEROL SULFATE HFA 108 (90 BASE) MCG/ACT IN AERS: 2.0000 | INHALATION_SPRAY | Freq: Four times a day (QID) | RESPIRATORY_TRACT | Status: DC | PRN

## 2014-10-03 MED ORDER — ZOSTER VACCINE LIVE 19400 UNT/0.65ML ~~LOC~~ SOLR: 0.6500 mL | Freq: Once | SUBCUTANEOUS | Status: DC

## 2014-10-03 MED ORDER — PROCHLORPERAZINE MALEATE 10 MG PO TABS: 10.0000 mg | ORAL_TABLET | Freq: Four times a day (QID) | ORAL | Status: DC | PRN

## 2014-10-03 MED ORDER — OMEPRAZOLE 20 MG PO CPDR: DELAYED_RELEASE_CAPSULE | ORAL | Status: DC

## 2014-10-03 MED ORDER — SERTRALINE HCL 100 MG PO TABS: ORAL_TABLET | ORAL | Status: DC

## 2014-10-03 MED ORDER — GABAPENTIN 300 MG PO CAPS: ORAL_CAPSULE | ORAL | Status: DC

## 2014-10-03 MED ORDER — METFORMIN HCL 500 MG PO TABS: 500.0000 mg | ORAL_TABLET | Freq: Three times a day (TID) | ORAL | Status: DC

## 2014-10-03 MED ORDER — VALACYCLOVIR HCL 1 G PO TABS: 500.0000 mg | ORAL_TABLET | Freq: Every day | ORAL | Status: DC

## 2014-10-03 MED ORDER — ALPRAZOLAM 0.5 MG PO TABS: ORAL_TABLET | ORAL | Status: DC

## 2014-10-03 NOTE — Progress Notes (Signed)
Patient ID: Beth Stanley, female   DOB: 06-23-1950, 64 y.o.   MRN: [id number]     This chart was scribed for Arlyss Queen, MD by Zola Button, Medical Scribe. This patient was seen in room 23 and the patient's care was started at 12:19 PM.   Chief Complaint:  Chief Complaint  Patient presents with  . Medication Refill  . Foot Pain    bilateral    HPI: Beth Stanley is a 64 y.o. female with a history of lymphoma and DM who reports to [facility] today complaining of bilateral foot pain, especially after waking up in the mornings. Patient notes having pain when her feet touch the floor. She has been taking Neurontin which does provide some relief. She last saw her oncologist over 2 months ago.  Patient also reports having some intermittent soreness to her ribcage. Her last CXR was about 9 months.  Patient also needs medications refilled. She has been checking her blood sugars, which have been around 60-80 typically and no higher than 110 per patient. She reports she has cut down on smoking to less than 0.5 ppd.    No past medical history on file. Past Surgical History  Procedure Laterality Date  . Total abdominal hysterectomy     Social History   Social History  . Marital Status: Divorced    Spouse Name: N/A  . Number of Children: N/A  . Years of Education: N/A   Social History Main Topics  . Smoking status: Current Every Day Smoker  . Smokeless tobacco: Never Used  . Alcohol Use: No  . Drug Use: No  . Sexual Activity: Not Asked   Other Topics Concern  . None   Social History Narrative   No family history on file. Allergies  Allergen Reactions  . Aspirin   . Tape     Makes Whelps on Skin   Prior to Admission medications   Medication Sig Start Date End Date Taking? Authorizing Provider  albuterol (PROVENTIL HFA;VENTOLIN HFA) 108 (90 BASE) MCG/ACT inhaler Inhale 2 puffs into the lungs every 4 (four) hours as needed for wheezing or shortness of breath (cough, shortness  of breath or wheezing.). 01/09/14  Yes Roselee Culver, MD  ALPRAZolam Duanne Moron) 0.5 MG tablet TAKE ONE TABLET AT BEDTIME AS NEEDED FORANXIETY 08/03/14  Yes Darlyne Russian, MD  blood glucose meter kit and supplies KIT Test blood sugar once daily. Dx code: E11.9 07/06/14  Yes Roselee Culver, MD  gabapentin (NEURONTIN) 300 MG capsule Take 1 tablet each night for peripheral neuropathy 03/14/14  Yes Darlyne Russian, MD  glucose blood test strip Test blood sugar once daily. E11.9 07/06/14  Yes Roselee Culver, MD  metFORMIN (GLUCOPHAGE) 500 MG tablet Take 1 tablet (500 mg total) by mouth 3 (three) times daily. 05/23/14  Yes Roselee Culver, MD  omeprazole (PRILOSEC) 20 MG capsule TAKE ONE (1) CAPSULE EACH DAY 08/18/14  Yes Roselee Culver, MD  prochlorperazine (COMPAZINE) 10 MG tablet Take 1 tablet (10 mg total) by mouth every 6 (six) hours as needed. 10/07/13  Yes Darlyne Russian, MD  ramipril (ALTACE) 5 MG capsule Take 1 capsule (5 mg total) by mouth daily. NO MORE REFILLS WITHOUT OFFICE VISIT - 2ND NOTICE 09/26/14  Yes Darlyne Russian, MD  sertraline (ZOLOFT) 100 MG tablet Take 1 tablet (100 mg total) by mouth daily. PATIENT NEEDS OFFICE VISIT FOR ADDITIONAL REFILLS 08/22/14  Yes Dorian Heckle English, PA  valACYclovir (VALTREX) 1000  MG tablet Take 0.5 tablets (500 mg total) by mouth daily. 01/24/14  Yes Dorian Heckle English, PA  cyclobenzaprine (FLEXERIL) 10 MG tablet Take 1 tablet (10 mg total) by mouth 3 (three) times daily as needed for muscle spasms. Patient not taking: Reported on 10/03/2014 05/18/14   Gerda Diss, MD  zoster vaccine live, PF, (ZOSTAVAX) 10071 UNT/0.65ML injection Inject 19,400 Units into the skin once. 10/03/14   Darlyne Russian, MD     ROS: The patient denies fevers, chills, night sweats, unintentional weight loss, chest pain, palpitations, wheezing, dyspnea on exertion, nausea, vomiting, abdominal pain, dysuria, hematuria, melena, numbness, weakness, or tingling.   All other systems  have been reviewed and were otherwise negative with the exception of those mentioned in the HPI and as above.    PHYSICAL EXAM: Filed Vitals:   10/03/14 1147  BP: 118/79  Pulse: 70  Temp: 98.3 F (36.8 C)  Resp: 16   Body mass index is 25.05 kg/(m^2).   General: Alert, no acute distress HEENT:  Normocephalic, atraumatic, oropharynx patent. Eye: [facility] Cardiovascular:  Regular rate and rhythm, no rubs murmurs or gallops.  No Carotid bruits, radial pulse intact. No pedal edema.  Respiratory: Clear to auscultation bilaterally.  No wheezes, rales, or rhonchi.  No cyanosis, no use of accessory musculature Abdominal: No organomegaly, abdomen is soft and non-tender, positive bowel sounds.  No masses. Musculoskeletal: Gait intact. Skin: No rashes. Neurologic: Facial musculature symmetric. Decreased sensation in her feet and toes, consistent with neuropathy. Psychiatric: Patient acts appropriately throughout our interaction. Lymphatic: No cervical or submandibular lymphadenopathy Genitourinary/Anorectal: No acute findings Neck: No adenopathy in the neck. Thyroid not enlarged.    LABS:    EKG/XRAY:   Primary read interpreted by Dr. Everlene Farrier at [facility].   ASSESSMENT/PLAN:  1. Need for prophylactic vaccination and inoculation against influenza  - Flu Vaccine QUAD 36+ mos IM  2. Diabetes mellitus without complication Results for orders placed or performed in visit on 10/03/14  POCT glycosylated hemoglobin (Hb A1C)  Result Value Ref Range   Hemoglobin A1C 6.7   POCT glucose (manual entry)  Result Value Ref Range   POC Glucose 111 (A) 70 - 99 mg/dl   - POCT glycosylated hemoglobin (Hb A1C) - Microalbumin, urine - POCT glucose (manual entry) - metFORMIN (GLUCOPHAGE) 500 MG tablet; Take 1 tablet (500 mg total) by mouth 3 (three) times daily.  Dispense: 180 tablet; Refill: 0  3. Lymphoma This has been stable and is followed by her oncologist at [facility]. She was encouraged to  make an appointment to follow-up with him.  4. Essential hypertension Blood pressure at goal 118/79 - COMPLETE METABOLIC PANEL WITH GFR - ramipril (ALTACE) 5 MG capsule; Take 1 capsule (5 mg total) by mouth daily. One daily  Dispense: 30 capsule; Refill: 11  5. Hyperlipidemia She has had muscle aches CK was done - Lipid panel - CK  6. Neck pain She has Voltaren gel to use.  7. Neuropathy associated with lymphoma  - CBC with Differential/Platelet - Sedimentation rate - diclofenac sodium (VOLTAREN) 1 % GEL; Apply 2 g topically 2 (two) times daily as needed.  Dispense: 100 g; Refill: 5 - gabapentin (NEURONTIN) 300 MG capsule; Take 1 tablet each night for peripheral neuropathy  Dispense: 90 capsule; Refill: 4 I told her she could increase her Neurontin is desired take 2 capsules at night and one tablet in the morning. 8. Screening for HIV without presence of risk factors  - HIV antibody  9. Need for hepatitis C screening test  - Hepatitis C antibody  10. Need for prophylactic vaccination against Streptococcus pneumoniae (pneumococcus)  - Pneumococcal conjugate vaccine 13-valent IM  11. Other back pain She is going to use diclofenac gel.  12. Depression  - sertraline (ZOLOFT) 100 MG tablet; One daily  Dispense: 30 tablet; Refill: 11 - albuterol (PROVENTIL HFA;VENTOLIN HFA) 108 (90 BASE) MCG/ACT inhaler; Inhale 2 puffs into the lungs every 6 (six) hours as needed for wheezing or shortness of breath (cough, shortness of breath or wheezing.).  Dispense: 1 Inhaler; Refill: 5 - ALPRAZolam (XANAX) 0.5 MG tablet; TAKE ONE TABLET AT BEDTIME AS NEEDED FORANXIETY  Dispense: 30 tablet; Refill: 5  13. Neuropathy I told her she could increase the dose to take 2 at bedtime and 1 in the morning. - gabapentin (NEURONTIN) 300 MG capsule; Take 1 tablet each night for peripheral neuropathy  Dispense: 90 capsule; Refill: 4  14. Asthma with acute exacerbation, mild persistent  - albuterol  (PROVENTIL HFA;VENTOLIN HFA) 108 (90 BASE) MCG/ACT inhaler; Inhale 2 puffs into the lungs every 6 (six) hours as needed for wheezing or shortness of breath (cough, shortness of breath or wheezing.).  Dispense: 1 Inhaler; Refill: 5  15. HSV infection  - valACYclovir (VALTREX) 1000 MG tablet; Take 0.5 tablets (500 mg total) by mouth daily.  Dispense: 30 tablet; Refill: 11  16. Nausea and vomiting, vomiting of unspecified type  - omeprazole (PRILOSEC) 20 MG capsule; TAKE ONE (1) CAPSULE EACH DAY  Dispense: 90 capsule; Refill: 3 - prochlorperazine (COMPAZINE) 10 MG tablet; Take 1 tablet (10 mg total) by mouth every 6 (six) hours as needed.  Dispense: 30 tablet; Refill: 4   I personally performed the services described in this documentation, which was scribed in my presence. The recorded information has been reviewed and is accurate.  Arlyss Queen, MD  [facility]  10/03/2014 3:07 PM  Gross sideeffects, risk and benefits, and alternatives of medications d/w patient. Patient is aware that all medications have potential sideeffects and we are unable to predict every sideeffect or drug-drug interaction that may occur.  Arlyss Queen MD 10/03/2014 12:33 PM

## 2014-10-04 ENCOUNTER — Encounter: Payer: Self-pay | Admitting: Family Medicine

## 2014-10-04 LAB — COMPLETE METABOLIC PANEL WITH GFR
ALBUMIN: 4.4 g/dL (ref 3.6–5.1)
ALK PHOS: 71 U/L (ref 33–130)
ALT: 13 U/L (ref 6–29)
AST: 16 U/L (ref 10–35)
BILIRUBIN TOTAL: 0.3 mg/dL (ref 0.2–1.2)
BUN: 13 mg/dL (ref 7–25)
CALCIUM: 9.4 mg/dL (ref 8.6–10.4)
CO2: 29 mmol/L (ref 20–31)
CREATININE: 0.64 mg/dL (ref 0.50–0.99)
Chloride: 103 mmol/L (ref 98–110)
GFR, Est African American: >89 mL/min (ref >=60)
GFR, Est Non African American: >89 mL/min (ref >=60)
GLUCOSE: 103 mg/dL — AB (ref 65–99)
Potassium: 4.8 mmol/L (ref 3.5–5.3)
SODIUM: 139 mmol/L (ref 135–146)
TOTAL PROTEIN: 7 g/dL (ref 6.1–8.1)

## 2014-10-04 LAB — LIPID PANEL
CHOLESTEROL: 153 mg/dL (ref 125–200)
HDL: 49 mg/dL (ref >=46)
LDL Cholesterol: 67 mg/dL (ref <130)
TRIGLYCERIDES: 185 mg/dL — AB (ref <150)
Total CHOL/HDL Ratio: 3.1 Ratio (ref <=5.0)
VLDL: 37 mg/dL — AB (ref <30)

## 2014-10-04 LAB — HEPATITIS C ANTIBODY: HCV Ab: NEGATIVE

## 2014-10-04 LAB — SEDIMENTATION RATE: Sed Rate: 6 mm/hr (ref 0–30)

## 2014-10-04 LAB — CK: Total CK: 65 U/L (ref 7–177)

## 2014-10-04 LAB — MICROALBUMIN, URINE: MICROALB UR: 0.3 mg/dL (ref <2.0)

## 2014-10-04 LAB — HIV ANTIBODY (ROUTINE TESTING W REFLEX): HIV 1&2 Ab, 4th Generation: NONREACTIVE

## 2014-10-10 ENCOUNTER — Ambulatory Visit: Payer: Commercial Managed Care - HMO

## 2015-01-16 ENCOUNTER — Other Ambulatory Visit: Payer: Self-pay | Admitting: Emergency Medicine

## 2015-01-24 DIAGNOSIS — Z6827 Body mass index (BMI) 27.0-27.9, adult: Secondary | ICD-10-CM | POA: Diagnosis not present

## 2015-01-24 DIAGNOSIS — Z1231 Encounter for screening mammogram for malignant neoplasm of breast: Secondary | ICD-10-CM | POA: Diagnosis not present

## 2015-01-24 DIAGNOSIS — Z1389 Encounter for screening for other disorder: Secondary | ICD-10-CM | POA: Diagnosis not present

## 2015-01-24 DIAGNOSIS — R319 Hematuria, unspecified: Secondary | ICD-10-CM | POA: Diagnosis not present

## 2015-01-24 DIAGNOSIS — Z01419 Encounter for gynecological examination (general) (routine) without abnormal findings: Secondary | ICD-10-CM | POA: Diagnosis not present

## 2015-01-24 DIAGNOSIS — Z780 Asymptomatic menopausal state: Secondary | ICD-10-CM | POA: Diagnosis not present

## 2015-02-07 ENCOUNTER — Other Ambulatory Visit: Payer: Self-pay | Admitting: Emergency Medicine

## 2015-02-08 NOTE — Telephone Encounter (Signed)
Faxed

## 2015-02-27 DIAGNOSIS — R0981 Nasal congestion: Secondary | ICD-10-CM | POA: Diagnosis not present

## 2015-02-27 DIAGNOSIS — J988 Other specified respiratory disorders: Secondary | ICD-10-CM | POA: Diagnosis not present

## 2015-02-27 DIAGNOSIS — R05 Cough: Secondary | ICD-10-CM | POA: Diagnosis not present

## 2015-05-04 ENCOUNTER — Telehealth: Payer: Self-pay | Admitting: Family Medicine

## 2015-05-04 ENCOUNTER — Encounter: Payer: Self-pay | Admitting: Family Medicine

## 2015-05-04 NOTE — Telephone Encounter (Signed)
Spoke with patients husband and he stated that she has her eye exam done at [facility] in [place]

## 2015-05-08 ENCOUNTER — Other Ambulatory Visit: Payer: Self-pay | Admitting: Emergency Medicine

## 2015-06-27 ENCOUNTER — Ambulatory Visit (INDEPENDENT_AMBULATORY_CARE_PROVIDER_SITE_OTHER): Payer: PPO | Admitting: Physician Assistant

## 2015-06-27 VITALS — BP 116/68 | HR 81 | Temp 98.1°F | Resp 18 | Ht 64.0 in | Wt 153.6 lb

## 2015-06-27 DIAGNOSIS — F329 Major depressive disorder, single episode, unspecified: Secondary | ICD-10-CM | POA: Diagnosis not present

## 2015-06-27 DIAGNOSIS — R112 Nausea with vomiting, unspecified: Secondary | ICD-10-CM

## 2015-06-27 DIAGNOSIS — I10 Essential (primary) hypertension: Secondary | ICD-10-CM | POA: Diagnosis not present

## 2015-06-27 DIAGNOSIS — Z0000 Encounter for general adult medical examination without abnormal findings: Secondary | ICD-10-CM

## 2015-06-27 DIAGNOSIS — C8590 Non-Hodgkin lymphoma, unspecified, unspecified site: Secondary | ICD-10-CM

## 2015-06-27 DIAGNOSIS — B009 Herpesviral infection, unspecified: Secondary | ICD-10-CM

## 2015-06-27 DIAGNOSIS — J4531 Mild persistent asthma with (acute) exacerbation: Secondary | ICD-10-CM | POA: Diagnosis not present

## 2015-06-27 DIAGNOSIS — Z1211 Encounter for screening for malignant neoplasm of colon: Secondary | ICD-10-CM | POA: Diagnosis not present

## 2015-06-27 DIAGNOSIS — G63 Polyneuropathy in diseases classified elsewhere: Secondary | ICD-10-CM

## 2015-06-27 DIAGNOSIS — F32A Depression, unspecified: Secondary | ICD-10-CM

## 2015-06-27 DIAGNOSIS — Z720 Tobacco use: Secondary | ICD-10-CM

## 2015-06-27 DIAGNOSIS — Z760 Encounter for issue of repeat prescription: Secondary | ICD-10-CM | POA: Diagnosis not present

## 2015-06-27 DIAGNOSIS — G629 Polyneuropathy, unspecified: Secondary | ICD-10-CM | POA: Diagnosis not present

## 2015-06-27 DIAGNOSIS — E119 Type 2 diabetes mellitus without complications: Secondary | ICD-10-CM | POA: Diagnosis not present

## 2015-06-27 DIAGNOSIS — Z23 Encounter for immunization: Secondary | ICD-10-CM

## 2015-06-27 DIAGNOSIS — F17200 Nicotine dependence, unspecified, uncomplicated: Secondary | ICD-10-CM

## 2015-06-27 MED ORDER — BLOOD GLUCOSE MONITOR KIT: PACK | Status: DC

## 2015-06-27 MED ORDER — VALACYCLOVIR HCL 1 G PO TABS: 500.0000 mg | ORAL_TABLET | Freq: Every day | ORAL | Status: DC

## 2015-06-27 MED ORDER — METFORMIN HCL 500 MG PO TABS: 500.0000 mg | ORAL_TABLET | Freq: Three times a day (TID) | ORAL | Status: DC

## 2015-06-27 MED ORDER — OMEPRAZOLE 20 MG PO CPDR: DELAYED_RELEASE_CAPSULE | ORAL | Status: DC

## 2015-06-27 MED ORDER — GABAPENTIN 300 MG PO CAPS: ORAL_CAPSULE | ORAL | Status: DC

## 2015-06-27 MED ORDER — ALBUTEROL SULFATE HFA 108 (90 BASE) MCG/ACT IN AERS: 2.0000 | INHALATION_SPRAY | Freq: Four times a day (QID) | RESPIRATORY_TRACT | Status: DC | PRN

## 2015-06-27 MED ORDER — GLUCOSE BLOOD VI STRP: ORAL_STRIP | Status: DC

## 2015-06-27 MED ORDER — RAMIPRIL 5 MG PO CAPS: 5.0000 mg | ORAL_CAPSULE | Freq: Every day | ORAL | Status: DC

## 2015-06-27 MED ORDER — VARENICLINE TARTRATE 1 MG PO TABS: 1.0000 mg | ORAL_TABLET | Freq: Two times a day (BID) | ORAL | Status: DC

## 2015-06-27 MED ORDER — VARENICLINE TARTRATE 0.5 MG X 11 & 1 MG X 42 PO MISC: ORAL | Status: DC

## 2015-06-27 MED ORDER — PROCHLORPERAZINE MALEATE 10 MG PO TABS: 10.0000 mg | ORAL_TABLET | Freq: Four times a day (QID) | ORAL | Status: DC | PRN

## 2015-06-27 MED ORDER — SERTRALINE HCL 100 MG PO TABS: ORAL_TABLET | ORAL | Status: DC

## 2015-06-27 NOTE — Patient Instructions (Signed)
     IF you received an x-ray today, you will receive an invoice from [facility]. Please contact [facility] at [phone] with questions or concerns regarding your invoice.   IF you received labwork today, you will receive an invoice from [facility]. Please contact [facility] at [phone] with questions or concerns regarding your invoice.   Our billing staff will not be able to assist you with questions regarding bills from these companies.  You will be contacted with the lab results as soon as they are available. The fastest way to get your results is to activate your My Chart account. Instructions are located on the last page of this paperwork. If you have not heard from us regarding the results in 2 weeks, please contact this office.      

## 2015-06-27 NOTE — Progress Notes (Signed)
07/01/2015 10:45 AM   DOB: [birth year] / MRN: [id number]  SUBJECTIVE:  Beth Stanley is a 65 y.o. female presenting to refill all of her medications.  She is feeling well today and would like to address any gaps in her care.  She has a long history of smoking and has tried gum and patches to quit without success.  She would like to try chantix.  She has a history of well controlled diabetes and HTN. She has a history of well managed depression, denies anhedonia and dysthymia today. She is due for a colonoscopy.      Immunization History  Administered Date(s) Administered  . Influenza,inj,Quad PF,36+ Mos 10/03/2014  . Influenza-Unspecified 11/11/2013  . Pneumococcal Conjugate-13 10/03/2014  . Td 06/27/2015     Depression screen PHQ 2/9 06/27/2015  Decreased Interest 0  Down, Depressed, Hopeless 0  PHQ - 2 Score 0  Altered sleeping -  Tired, decreased energy -  Change in appetite -  Feeling bad or failure about yourself  -  Trouble concentrating -  Moving slowly or fidgety/restless -  Suicidal thoughts -  PHQ-9 Score -  Difficult doing work/chores -     She is allergic to aspirin and tape.   She  has no past medical history on file.    She  reports that she has been smoking.  She has never used smokeless tobacco. She reports that she does not drink alcohol or use illicit drugs. She  has no sexual activity history on file. The patient  has past surgical history that includes Total abdominal hysterectomy.  Her family history is not on file.  Review of Systems  Constitutional: Negative for fever and chills.  Eyes: Negative for blurred vision.  Respiratory: Negative for cough and shortness of breath.   Cardiovascular: Negative for chest pain.  Gastrointestinal: Negative for nausea and abdominal pain.  Genitourinary: Negative for dysuria, urgency and frequency.  Musculoskeletal: Negative for myalgias.  Skin: Negative for rash.  Neurological: Negative for dizziness, tingling  and headaches.  Psychiatric/Behavioral: Negative for depression. The patient is not nervous/anxious.     Problem list and medications reviewed and updated by myself where necessary, and exist elsewhere in the encounter.   OBJECTIVE:  BP 116/68 mmHg  Pulse 81  Temp(Src) 98.1 F (36.7 C) (Oral)  Resp 18  Ht '5\' 4"'  (1.626 m)  Wt 153 lb 9.6 oz (69.673 kg)  BMI 26.35 kg/m2  SpO2 97%  Physical Exam  Constitutional: She is oriented to person, place, and time. She appears well-nourished. No distress.  Eyes: EOM are normal. Pupils are equal, round, and reactive to light.  Cardiovascular: Normal rate.   Pulmonary/Chest: Effort normal.  Abdominal: She exhibits no distension.  Neurological: She is alert and oriented to person, place, and time. No cranial nerve deficit. Gait normal.  Skin: Skin is dry. She is not diaphoretic.  Psychiatric: She has a normal mood and affect.  Nursing note and vitals reviewed.   No results found for this or any previous visit (from the past 72 hour(s)).  No results found.  CBC Latest Ref Rng 06/27/2015 10/03/2014 01/24/2014  WBC 3.8 - 10.8 K/uL 7.9 8.3 9.4  Hemoglobin 11.7 - 15.5 g/dL 12.3 12.6 13.1  Hematocrit 35.0 - 45.0 % 36.7 36.6 39.0  Platelets 140 - 400 K/uL 230 237 253   Lab Results  Component Value Date   HGBA1C 6.9* 06/27/2015   CMP Latest Ref Rng 06/27/2015 10/03/2014 11/28/2013  Glucose 65 - 99  mg/dL 128(H) 103(H) 85  BUN 7 - 25 mg/dL '8 13 12  ' Creatinine 0.50 - 0.99 mg/dL 0.78 0.64 0.85  Sodium 135 - 146 mmol/L 141 139 138  Potassium 3.5 - 5.3 mmol/L 3.9 4.8 4.0  Chloride 98 - 110 mmol/L 101 103 102  CO2 20 - 31 mmol/L '22 29 25  ' Calcium 8.6 - 10.4 mg/dL 9.3 9.4 9.4  Total Protein 6.1 - 8.1 g/dL 7.0 7.0 7.6  Total Bilirubin 0.2 - 1.2 mg/dL 0.2 0.3 0.4  Alkaline Phos 33 - 130 U/L 78 71 70  AST 10 - 35 U/L '21 16 24  ' ALT 6 - 29 U/L 19 13 34   Lab Results  Component Value Date   HGBA1C 6.9* 06/27/2015         ASSESSMENT AND  PLAN  Beth Stanley was seen today for medication refill.  Diagnoses and all orders for this visit:  Depression -     sertraline (ZOLOFT) 100 MG tablet; One daily  Neuropathy (HCC) -     gabapentin (NEURONTIN) 300 MG capsule; Take 1 tablet each night for peripheral neuropathy  Nausea and vomiting, vomiting of unspecified type -     prochlorperazine (COMPAZINE) 10 MG tablet; Take 1 tablet (10 mg total) by mouth every 6 (six) hours as needed.  Essential hypertension -     ramipril (ALTACE) 5 MG capsule; Take 1 capsule (5 mg total) by mouth daily. One daily -     COMPLETE METABOLIC PANEL WITH GFR -     CBC  HSV infection -     valACYclovir (VALTREX) 1000 MG tablet; Take 0.5 tablets (500 mg total) by mouth daily.  Type 2 diabetes mellitus without complication, without long-term current use of insulin (HCC) -     blood glucose meter kit and supplies KIT; Test blood sugar once daily. Dx code: E11.9 -     glucose blood test strip; Test blood sugar once daily. E11.9 -     metFORMIN (GLUCOPHAGE) 500 MG tablet; Take 1 tablet (500 mg total) by mouth 3 (three) times daily. -     Hemoglobin A1c -     Ambulatory referral to Ophthalmology  Special screening for malignant neoplasms, colon -     Ambulatory referral to Gastroenterology  Smoker -     varenicline (CHANTIX STARTING MONTH PAK) 0.5 MG X 11 & 1 MG X 42 tablet; Take one 0.5 mg tab by mouth once daily for 3 days then increase to one 0.5 mg tab twice daily for 4 days then take one 1 mg tab twice daily -     varenicline (CHANTIX CONTINUING MONTH PAK) 1 MG tablet; Take 1 tablet (1 mg total) by mouth 2 (two) times daily.  Need for TD vaccine -     Td vaccine greater than or equal to 7yo preservative free IM  Medication refill -     albuterol (PROVENTIL HFA;VENTOLIN HFA) 108 (90 Base) MCG/ACT inhaler; Inhale 2 puffs into the lungs every 6 (six) hours as needed for wheezing or shortness of breath (cough, shortness of breath or wheezing.). -      omeprazole (PRILOSEC) 20 MG capsule; TAKE ONE (1) CAPSULE EACH DAY  Preventative health care -     MM DIGITAL SCREENING BILATERAL; Future    The patient was advised to call or return to clinic if she does not see an improvement in symptoms or to seek the care of the closest emergency department if she worsens with the above plan.  [name], MHS, PA-C [facility] 07/01/2015 10:45 AM

## 2015-06-28 LAB — CBC
HCT: 36.7 % (ref 35.0–45.0)
HEMOGLOBIN: 12.3 g/dL (ref 11.7–15.5)
MCH: 29.6 pg (ref 27.0–33.0)
MCHC: 33.5 g/dL (ref 32.0–36.0)
MCV: 88.4 fL (ref 80.0–100.0)
MPV: 9.6 fL (ref 7.5–12.5)
Platelets: 230 10*3/uL (ref 140–400)
RBC: 4.15 MIL/uL (ref 3.80–5.10)
RDW: 14.5 % (ref 11.0–15.0)
WBC: 7.9 10*3/uL (ref 3.8–10.8)

## 2015-06-28 LAB — HEMOGLOBIN A1C
Hgb A1c MFr Bld: 6.9 % — ABNORMAL HIGH (ref <5.7)
MEAN PLASMA GLUCOSE: 151 mg/dL

## 2015-06-29 LAB — COMPLETE METABOLIC PANEL WITH GFR
ALBUMIN: 4.2 g/dL (ref 3.6–5.1)
ALK PHOS: 78 U/L (ref 33–130)
ALT: 19 U/L (ref 6–29)
AST: 21 U/L (ref 10–35)
BUN: 8 mg/dL (ref 7–25)
CO2: 22 mmol/L (ref 20–31)
CREATININE: 0.78 mg/dL (ref 0.50–0.99)
Calcium: 9.3 mg/dL (ref 8.6–10.4)
Chloride: 101 mmol/L (ref 98–110)
GFR, EST NON AFRICAN AMERICAN: 80 mL/min (ref >=60)
GLUCOSE: 128 mg/dL — AB (ref 65–99)
Potassium: 3.9 mmol/L (ref 3.5–5.3)
Sodium: 141 mmol/L (ref 135–146)
TOTAL PROTEIN: 7 g/dL (ref 6.1–8.1)
Total Bilirubin: 0.2 mg/dL (ref 0.2–1.2)

## 2015-07-05 ENCOUNTER — Telehealth: Payer: Self-pay | Admitting: Emergency Medicine

## 2015-07-05 NOTE — Telephone Encounter (Signed)
Pt given lab results and instructed to watch sugar intake, diet and exercise control to prevent insulin control Pt verbalized understanding and will start exercising Lab letter sent out

## 2015-07-06 ENCOUNTER — Telehealth: Payer: Self-pay

## 2015-07-06 NOTE — Telephone Encounter (Signed)
Spoke with Mr. Mclamore in reference to Mrs. Butzin's referral to [facility], she would like to cancel the appointment that she has scheduled with them and see the Dr that she saw before which was Dr. Heloise Purpura at [facility]. I advised Mr. Mcgown that was fine just call [facility] and cancel the appt and schedule with Dr. Heloise Purpura if that's what they prefer. Pt understood.

## 2015-07-06 NOTE — Telephone Encounter (Signed)
Legrand Como please review - FYI

## 2015-07-08 NOTE — Telephone Encounter (Signed)
Thank you Rose.  [name], MS, PA-C 8:30 AM, 07/08/2015

## 2015-08-06 DIAGNOSIS — K219 Gastro-esophageal reflux disease without esophagitis: Secondary | ICD-10-CM | POA: Diagnosis not present

## 2015-08-06 DIAGNOSIS — R05 Cough: Secondary | ICD-10-CM | POA: Diagnosis not present

## 2015-08-06 DIAGNOSIS — R110 Nausea: Secondary | ICD-10-CM | POA: Diagnosis not present

## 2015-08-06 DIAGNOSIS — Z91040 Latex allergy status: Secondary | ICD-10-CM | POA: Diagnosis not present

## 2015-08-06 DIAGNOSIS — Z90710 Acquired absence of both cervix and uterus: Secondary | ICD-10-CM | POA: Diagnosis not present

## 2015-08-06 DIAGNOSIS — Z79899 Other long term (current) drug therapy: Secondary | ICD-10-CM | POA: Diagnosis not present

## 2015-08-06 DIAGNOSIS — G2581 Restless legs syndrome: Secondary | ICD-10-CM | POA: Diagnosis not present

## 2015-08-06 DIAGNOSIS — M791 Myalgia: Secondary | ICD-10-CM | POA: Diagnosis not present

## 2015-08-06 DIAGNOSIS — E119 Type 2 diabetes mellitus without complications: Secondary | ICD-10-CM | POA: Diagnosis not present

## 2015-08-06 DIAGNOSIS — Z886 Allergy status to analgesic agent status: Secondary | ICD-10-CM | POA: Diagnosis not present

## 2015-08-06 DIAGNOSIS — I10 Essential (primary) hypertension: Secondary | ICD-10-CM | POA: Diagnosis not present

## 2015-08-06 DIAGNOSIS — J449 Chronic obstructive pulmonary disease, unspecified: Secondary | ICD-10-CM | POA: Diagnosis not present

## 2015-08-06 DIAGNOSIS — F419 Anxiety disorder, unspecified: Secondary | ICD-10-CM | POA: Diagnosis not present

## 2015-08-06 DIAGNOSIS — Z1211 Encounter for screening for malignant neoplasm of colon: Secondary | ICD-10-CM | POA: Diagnosis not present

## 2015-08-06 DIAGNOSIS — Z7984 Long term (current) use of oral hypoglycemic drugs: Secondary | ICD-10-CM | POA: Diagnosis not present

## 2015-08-06 DIAGNOSIS — G8929 Other chronic pain: Secondary | ICD-10-CM | POA: Diagnosis not present

## 2015-08-06 DIAGNOSIS — F329 Major depressive disorder, single episode, unspecified: Secondary | ICD-10-CM | POA: Diagnosis not present

## 2015-08-06 DIAGNOSIS — Z8572 Personal history of non-Hodgkin lymphomas: Secondary | ICD-10-CM | POA: Diagnosis not present

## 2015-08-06 DIAGNOSIS — F17210 Nicotine dependence, cigarettes, uncomplicated: Secondary | ICD-10-CM | POA: Diagnosis not present

## 2015-08-14 ENCOUNTER — Other Ambulatory Visit: Payer: Self-pay

## 2015-08-14 NOTE — Telephone Encounter (Signed)
Pharm reqs RF of alprazolam. Dr Everlene Farrier, I will send this to you since you normally Rx this for pt, but she saw Legrand Como last in June for med refills (don't see this one discussed though).

## 2015-08-15 MED ORDER — ALPRAZOLAM 0.5 MG PO TABS: ORAL_TABLET | ORAL | 5 refills | Status: DC

## 2015-08-15 NOTE — Telephone Encounter (Signed)
Faxed

## 2015-08-29 ENCOUNTER — Telehealth: Payer: Self-pay

## 2015-08-29 NOTE — Telephone Encounter (Signed)
Patient is returning a missed phone call. I unable to figure out who called the patient. Please call! [phone]

## 2015-10-07 DIAGNOSIS — J209 Acute bronchitis, unspecified: Secondary | ICD-10-CM | POA: Diagnosis not present

## 2015-10-07 DIAGNOSIS — M542 Cervicalgia: Secondary | ICD-10-CM | POA: Diagnosis not present

## 2015-10-07 DIAGNOSIS — R591 Generalized enlarged lymph nodes: Secondary | ICD-10-CM | POA: Diagnosis not present

## 2015-10-08 ENCOUNTER — Ambulatory Visit: Payer: PPO

## 2015-10-29 DIAGNOSIS — L089 Local infection of the skin and subcutaneous tissue, unspecified: Secondary | ICD-10-CM | POA: Diagnosis not present

## 2015-10-29 DIAGNOSIS — F17200 Nicotine dependence, unspecified, uncomplicated: Secondary | ICD-10-CM | POA: Diagnosis not present

## 2015-11-06 DIAGNOSIS — R1032 Left lower quadrant pain: Secondary | ICD-10-CM | POA: Diagnosis not present

## 2015-11-06 DIAGNOSIS — G629 Polyneuropathy, unspecified: Secondary | ICD-10-CM | POA: Diagnosis not present

## 2015-11-06 DIAGNOSIS — Z79899 Other long term (current) drug therapy: Secondary | ICD-10-CM | POA: Diagnosis not present

## 2015-11-06 DIAGNOSIS — R739 Hyperglycemia, unspecified: Secondary | ICD-10-CM | POA: Diagnosis not present

## 2015-11-06 DIAGNOSIS — F419 Anxiety disorder, unspecified: Secondary | ICD-10-CM | POA: Diagnosis not present

## 2015-11-06 DIAGNOSIS — R3121 Asymptomatic microscopic hematuria: Secondary | ICD-10-CM | POA: Diagnosis not present

## 2015-11-06 DIAGNOSIS — I10 Essential (primary) hypertension: Secondary | ICD-10-CM | POA: Diagnosis not present

## 2015-11-06 DIAGNOSIS — C8100 Nodular lymphocyte predominant Hodgkin lymphoma, unspecified site: Secondary | ICD-10-CM | POA: Diagnosis not present

## 2015-11-06 DIAGNOSIS — F329 Major depressive disorder, single episode, unspecified: Secondary | ICD-10-CM | POA: Diagnosis not present

## 2015-11-06 DIAGNOSIS — Z886 Allergy status to analgesic agent status: Secondary | ICD-10-CM | POA: Diagnosis not present

## 2015-11-06 DIAGNOSIS — M25559 Pain in unspecified hip: Secondary | ICD-10-CM | POA: Diagnosis not present

## 2015-11-06 DIAGNOSIS — Z91040 Latex allergy status: Secondary | ICD-10-CM | POA: Diagnosis not present

## 2015-12-08 DIAGNOSIS — J209 Acute bronchitis, unspecified: Secondary | ICD-10-CM | POA: Diagnosis not present

## 2015-12-13 DIAGNOSIS — N390 Urinary tract infection, site not specified: Secondary | ICD-10-CM | POA: Diagnosis not present

## 2016-02-09 ENCOUNTER — Other Ambulatory Visit: Payer: Self-pay | Admitting: Physician Assistant

## 2016-02-09 DIAGNOSIS — R112 Nausea with vomiting, unspecified: Secondary | ICD-10-CM

## 2016-02-09 NOTE — Telephone Encounter (Signed)
06/2015 last ov and labs and refill

## 2016-02-25 DIAGNOSIS — J111 Influenza due to unidentified influenza virus with other respiratory manifestations: Secondary | ICD-10-CM | POA: Diagnosis not present

## 2016-03-12 ENCOUNTER — Other Ambulatory Visit: Payer: Self-pay | Admitting: Physician Assistant

## 2016-03-12 DIAGNOSIS — E119 Type 2 diabetes mellitus without complications: Secondary | ICD-10-CM

## 2016-03-25 DIAGNOSIS — Z780 Asymptomatic menopausal state: Secondary | ICD-10-CM | POA: Diagnosis not present

## 2016-03-25 DIAGNOSIS — Z130 Encounter for screening for diseases of the blood and blood-forming organs and certain disorders involving the immune mechanism: Secondary | ICD-10-CM | POA: Diagnosis not present

## 2016-03-25 DIAGNOSIS — Z01419 Encounter for gynecological examination (general) (routine) without abnormal findings: Secondary | ICD-10-CM | POA: Diagnosis not present

## 2016-03-25 DIAGNOSIS — Z1231 Encounter for screening mammogram for malignant neoplasm of breast: Secondary | ICD-10-CM | POA: Diagnosis not present

## 2016-03-25 DIAGNOSIS — Z1389 Encounter for screening for other disorder: Secondary | ICD-10-CM | POA: Diagnosis not present

## 2016-03-25 DIAGNOSIS — N952 Postmenopausal atrophic vaginitis: Secondary | ICD-10-CM | POA: Diagnosis not present

## 2016-03-25 DIAGNOSIS — Z6827 Body mass index (BMI) 27.0-27.9, adult: Secondary | ICD-10-CM | POA: Diagnosis not present

## 2016-03-27 DIAGNOSIS — Z0000 Encounter for general adult medical examination without abnormal findings: Secondary | ICD-10-CM

## 2016-04-02 ENCOUNTER — Telehealth: Payer: Self-pay | Admitting: Emergency Medicine

## 2016-04-02 NOTE — Telephone Encounter (Signed)
Patient needs to rtc to re-establish care for a refill of controlled substance. Please notify patient that Dr. Everlene Farrier is now retired.

## 2016-04-02 NOTE — Telephone Encounter (Signed)
Please have faxed

## 2016-04-03 MED ORDER — ALPRAZOLAM 0.5 MG PO TABS: ORAL_TABLET | ORAL | 1 refills | Status: DC

## 2016-04-06 ENCOUNTER — Telehealth: Payer: Self-pay | Admitting: Physician Assistant

## 2016-04-06 NOTE — Telephone Encounter (Signed)
Patient calling after hours line at roughly 530 pm today complaining of intermittent left lower lip numbness that has been present for about three days.  Denies weakness, speech difficulty, HA, confusion. No lip throat or tongue swelling.   She has a history of diabetes, HTN, smoking, and family history of stroke. Given history of symptomology advised that she go to the ED or closest [facility] for evaluation and management given risk that her symptoms could represent a CVA.  Patient and husband both voiced understanding.  [name], MS, PA-C 7:41 PM, 04/06/2016

## 2016-04-07 ENCOUNTER — Encounter (HOSPITAL_COMMUNITY): Payer: Self-pay

## 2016-04-07 ENCOUNTER — Ambulatory Visit (HOSPITAL_COMMUNITY)
Admission: RE | Admit: 2016-04-07 | Discharge: 2016-04-07 | Disposition: A | Discharge status: Home / Self Care | Payer: PPO | Source: Ambulatory Visit | Attending: Physician Assistant | Admitting: Physician Assistant

## 2016-04-07 ENCOUNTER — Encounter: Payer: Self-pay | Admitting: Physician Assistant

## 2016-04-07 ENCOUNTER — Ambulatory Visit (INDEPENDENT_AMBULATORY_CARE_PROVIDER_SITE_OTHER): Payer: PPO | Admitting: Physician Assistant

## 2016-04-07 ENCOUNTER — Telehealth: Payer: Self-pay | Admitting: Physician Assistant

## 2016-04-07 VITALS — BP 136/78 | HR 84 | Temp 98.4°F | Resp 16 | Ht 64.0 in | Wt 149.8 lb

## 2016-04-07 DIAGNOSIS — R200 Anesthesia of skin: Secondary | ICD-10-CM | POA: Diagnosis not present

## 2016-04-07 DIAGNOSIS — R9082 White matter disease, unspecified: Secondary | ICD-10-CM | POA: Insufficient documentation

## 2016-04-07 DIAGNOSIS — R29810 Facial weakness: Secondary | ICD-10-CM

## 2016-04-07 IMAGING — CT CT HEAD W/O CM
3 of 4 series · 15 of 47 positions shown, 18 images · non-contrast
Comparison: None available

CLINICAL DATA: Lower lip numbness and trouble speaking for 3 days.

EXAM:
CT HEAD WITHOUT CONTRAST
TECHNIQUE: Contiguous axial images were obtained from the base of the skull
through the vertex without intravenous contrast.

[Series 2: head w/o · axial · non-contrast · 0.47mm/px · z∈[-140,-20]mm · 9 of 30 slices shown, 12 images]
[im 3/30  brain]
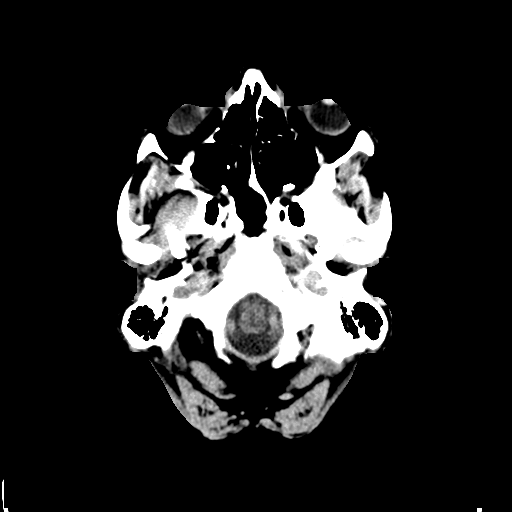
[im 3/30  bone]
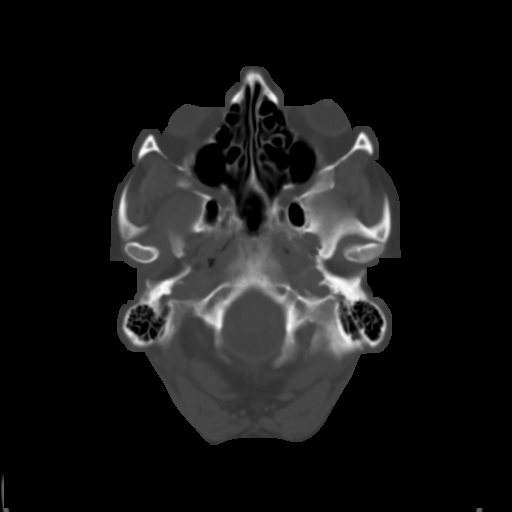
[im 7/30  brain]
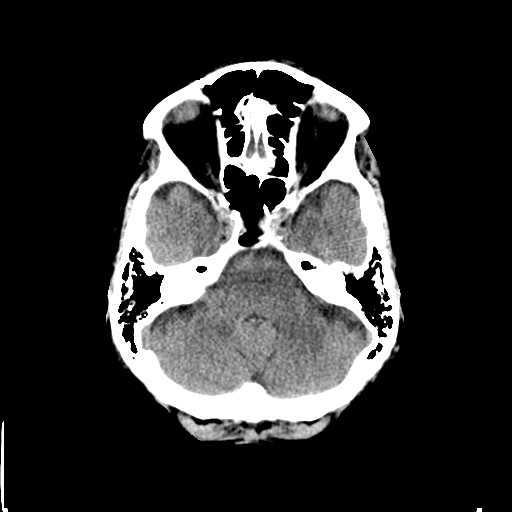
[im 9/30  brain]
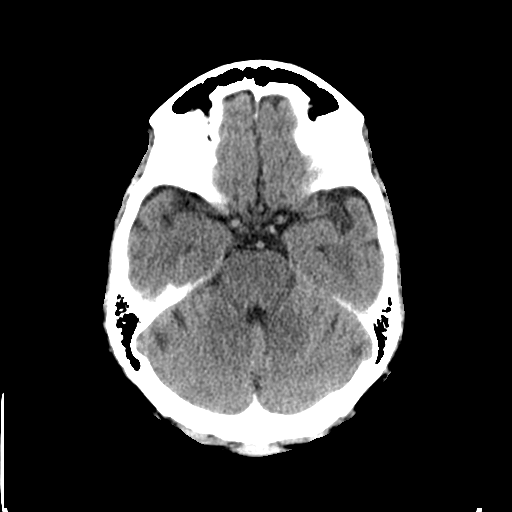
[im 13/30  brain]
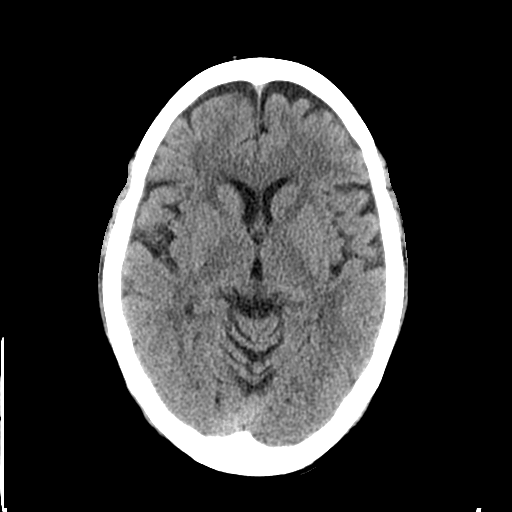
[im 15/30  brain]
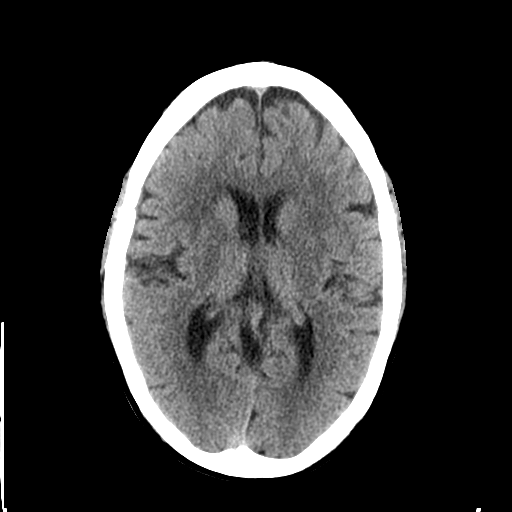
[im 15/30  bone]
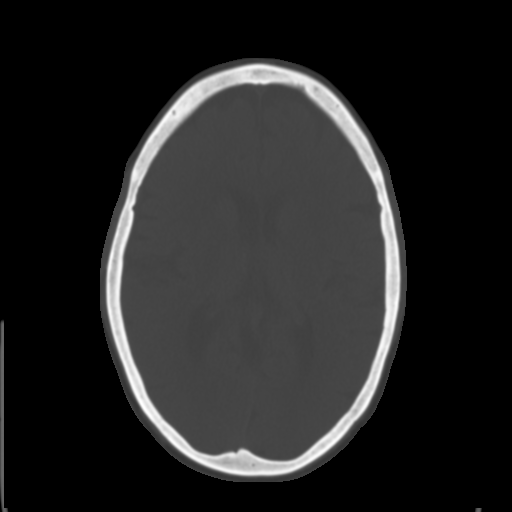
[im 17/30  brain]
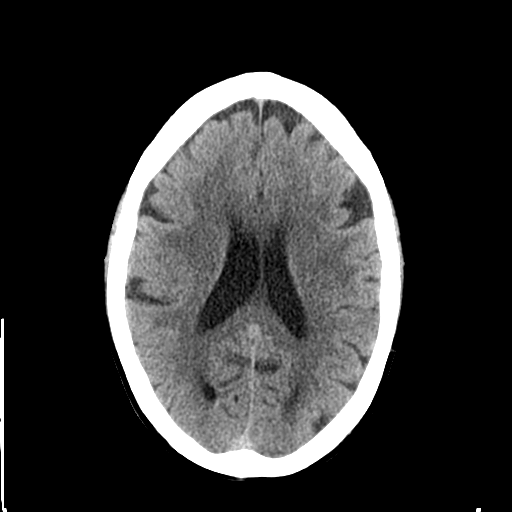
[im 21/30  brain]
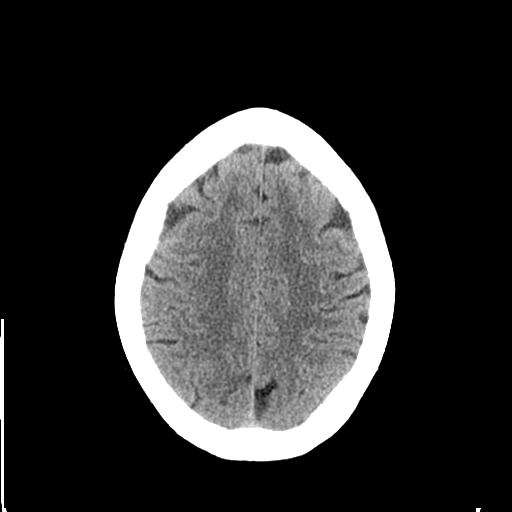
[im 23/30  brain]
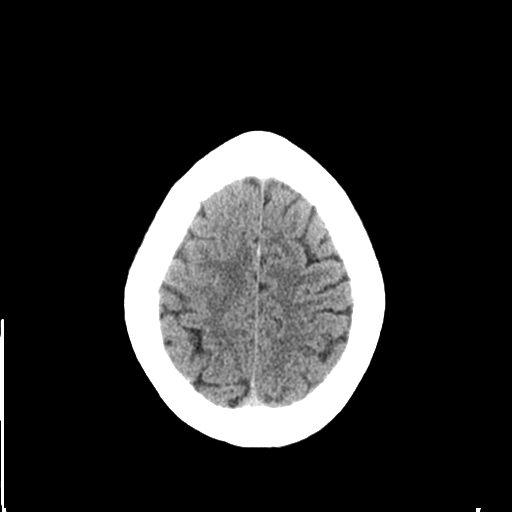
[im 27/30  brain]
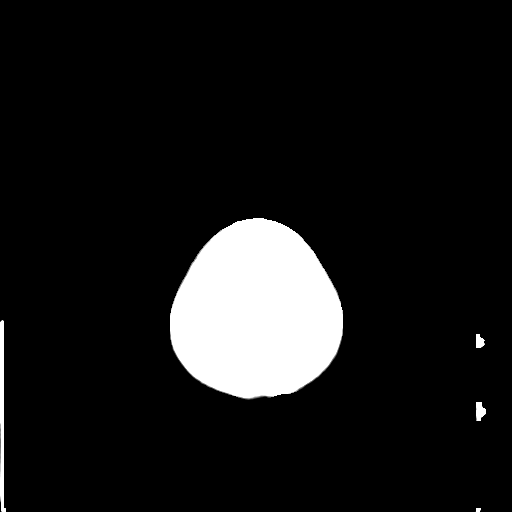
[im 27/30  bone]
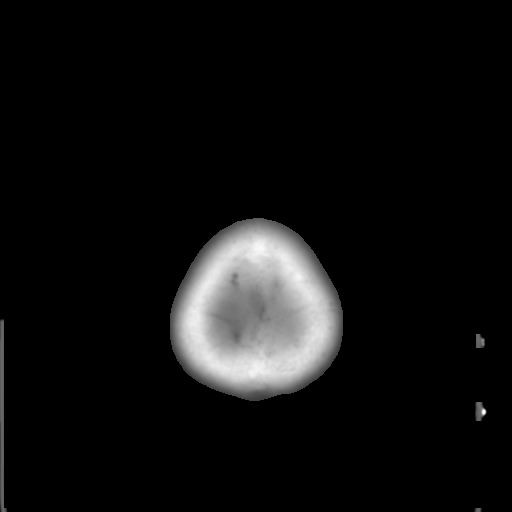

[Series 5: coronal · coronal · 0.29mm/px · 3 of 73 slices shown]
[im 25/73  brain]
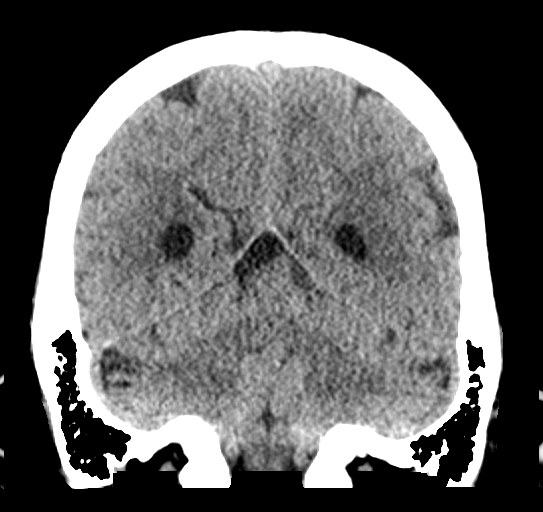
[im 33/73  brain]
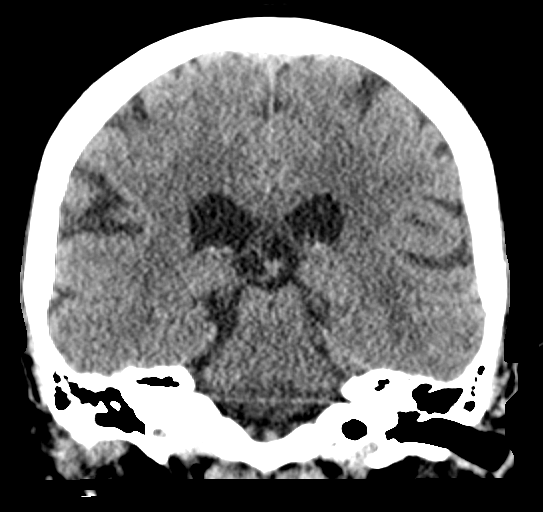
[im 41/73  brain]
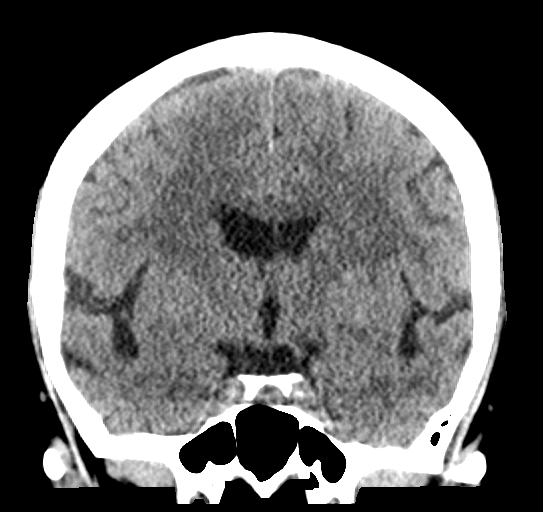

[Series 6: sagittal · sagittal · 0.29mm/px · 3 of 53 slices shown]
[im 18/53  brain]
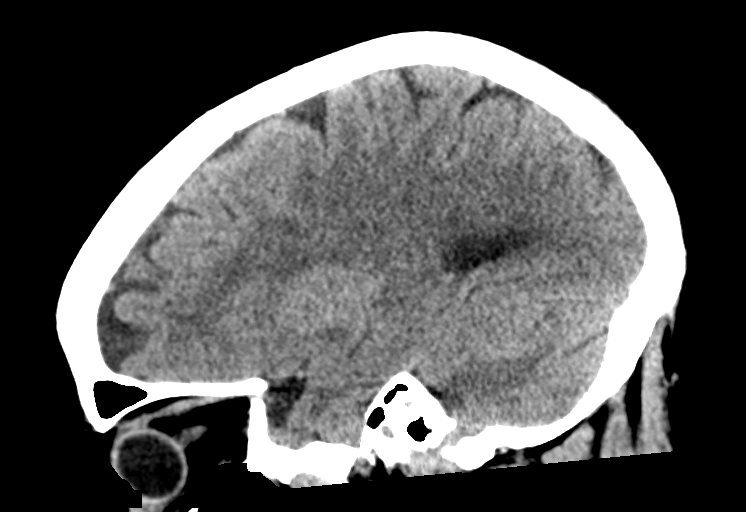
[im 27/53  brain]
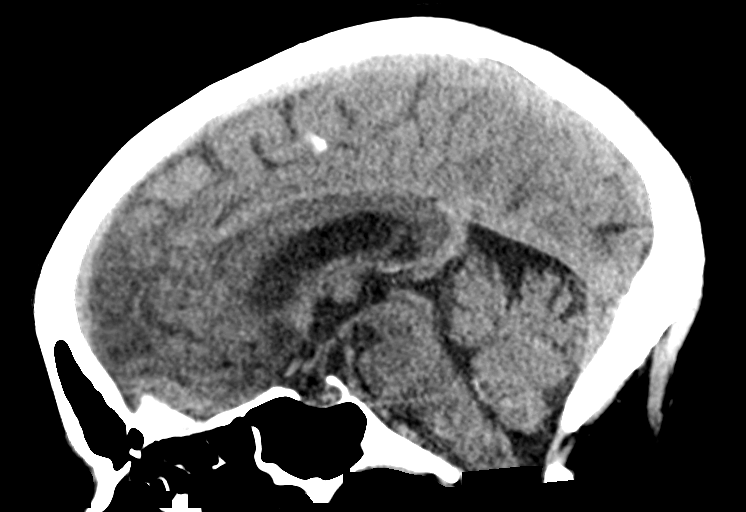
[im 35/53  brain]
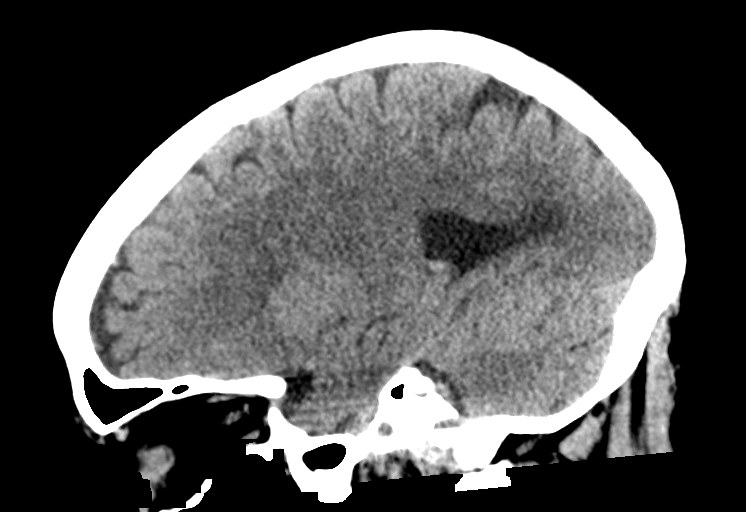

[15 of 47 positions shown; findings below may reference images not displayed]

FINDINGS: Brain: No evidence of acute infarction, hemorrhage, hydrocephalus,
extra-axial collection or mass lesion/mass effect. Mild to moderate
cerebral white matter disease, usually chronic small vessel ischemia
given patient's history of multiple vascular risk factors.

Vascular: No hyperdense vessel or unexpected calcification.

Skull: Normal. Negative for fracture or focal lesion.

Sinuses/Orbits: No acute finding.
IMPRESSION: 1. No acute finding.
2. Mild to moderate white matter disease, likely chronic small
vessel ischemia.

## 2016-04-07 NOTE — Progress Notes (Signed)
04/07/2016 9:37 PM   DOB: 1950-08-23 / MRN: [id number]  SUBJECTIVE:  Beth Stanley is a 66 y.o. female presenting for left lower lip numbness. Tells me this may last 5 minutes and occurs up to twice. This has been present for about 7 days now. Denies weakness of the lip, slurring of speech, water dribbling with drinking.  Denies HA. She feels that the problem is not getting better or worse.  She has not tried any medication yet. Denies neck pain. No new medication.   Tells me she did have some dental work but this was on the left sided and was two weeks.   She is allergic to aspirin and tape.   She  has no past medical history on file.    She  reports that she has been smoking.  She has never used smokeless tobacco. She reports that she does not drink alcohol or use drugs. She  has no sexual activity history on file. The patient  has a past surgical history that includes Total abdominal hysterectomy.  Her family history is not on file.  Review of Systems  Constitutional: Negative for fever.  Respiratory: Negative for cough.   Gastrointestinal: Negative for nausea.  Musculoskeletal: Negative for back pain, falls, joint pain, myalgias and neck pain.  Skin: Negative for itching and rash.  Neurological: Positive for tingling. Negative for dizziness, focal weakness and headaches.    The problem list and medications were reviewed and updated by myself where necessary and exist elsewhere in the encounter.   OBJECTIVE:  BP 136/78 (BP Location: Right Arm, Patient Position: Sitting, Cuff Size: Small)   Pulse 84   Temp 98.4 F (36.9 C) (Oral)   Resp 16   Ht '5\' 4"'  (1.626 m)   Wt 149 lb 12.8 oz (67.9 kg)   SpO2 97%   BMI 25.71 kg/m   Physical Exam  Constitutional: She is oriented to person, place, and time. She appears well-nourished.  Cardiovascular: Normal rate and regular rhythm.   Pulmonary/Chest: Effort normal and breath sounds normal.  Musculoskeletal: Normal range of motion.  She exhibits no edema, tenderness or deformity.  Neurological: She is alert and oriented to person, place, and time. She has normal reflexes. She displays normal reflexes. A cranial nerve deficit (left side facial droop with forhead sparring) is present. No sensory deficit. She exhibits normal muscle tone. Coordination normal.  Skin: Skin is warm and dry.  Psychiatric: She has a normal mood and affect. Her behavior is normal. Judgment and thought content normal.    Lab Results  Component Value Date   CHOL 153 10/03/2014   HDL 49 10/03/2014   LDLCALC 67 10/03/2014   TRIG 185 (H) 10/03/2014   CHOLHDL 3.1 10/03/2014   Lab Results  Component Value Date   TSH 0.681 02/03/2012   Lab Results  Component Value Date   HGBA1C 6.9 (H) 06/27/2015      No results found for this or any previous visit (from the past 72 hour(s)).  Ct Head Wo Contrast  Result Date: 04/07/2016 CLINICAL DATA:  Lower lip numbness and trouble speaking for 3 days. EXAM: CT HEAD WITHOUT CONTRAST TECHNIQUE: Contiguous axial images were obtained from the base of the skull through the vertex without intravenous contrast. COMPARISON:  None available FINDINGS: Brain: No evidence of acute infarction, hemorrhage, hydrocephalus, extra-axial collection or mass lesion/mass effect. Mild to moderate cerebral white matter disease, usually chronic small vessel ischemia given patient's history of multiple vascular risk factors. Vascular: No  hyperdense vessel or unexpected calcification. Skull: Normal. Negative for fracture or focal lesion. Sinuses/Orbits: No acute finding. IMPRESSION: 1. No acute finding. 2. Mild to moderate white matter disease, likely chronic small vessel ischemia. Electronically Signed   By: Monte Fantasia M.D.   On: 04/07/2016 19:06    ASSESSMENT AND PLAN:  [name] was seen today for numbness and back pain.  Diagnoses and all orders for this visit:  Numbness -     CBC with Differential -      CMP14+EGFR  Mouth droop due to facial weakness: CT negative for any acute findings. Of note she has multiple risk factors that include smoking currently. She tells me that she is ready to quit and will start using nicotine replacement to stop. She would benefit from statin therapy despite a largely normal lipid panel. She has an appointment with me next week and I will discuss this with her at that tie.  -     CT Head Wo Contrast; Future    The patient is advised to call or return to clinic if she does not see an improvement in symptoms, or to seek the care of the closest emergency department if she worsens with the above plan.   [name], MHS, PA-C [facility] 04/07/2016 9:37 PM

## 2016-04-07 NOTE — Patient Instructions (Signed)
GO TO [place] [address] THRU ER AND TELL THEM YOU ARE THERE FOR A CT SCAN.

## 2016-04-08 LAB — CMP14+EGFR
ALBUMIN: 4.5 g/dL (ref 3.6–4.8)
ALT: 18 IU/L (ref 0–32)
AST: 19 IU/L (ref 0–40)
Albumin/Globulin Ratio: 1.7 (ref 1.2–2.2)
Alkaline Phosphatase: 88 IU/L (ref 39–117)
BUN / CREAT RATIO: 14 (ref 12–28)
BUN: 9 mg/dL (ref 8–27)
Bilirubin Total: <0.2 mg/dL (ref 0.0–1.2)
CO2: 26 mmol/L (ref 18–29)
CREATININE: 0.66 mg/dL (ref 0.57–1.00)
Calcium: 9.2 mg/dL (ref 8.7–10.3)
Chloride: 101 mmol/L (ref 96–106)
GFR calc Af Amer: 107 mL/min/{1.73_m2} (ref >59)
GFR, EST NON AFRICAN AMERICAN: 93 mL/min/{1.73_m2} (ref >59)
GLOBULIN, TOTAL: 2.7 g/dL (ref 1.5–4.5)
Glucose: 89 mg/dL (ref 65–99)
Potassium: 4 mmol/L (ref 3.5–5.2)
SODIUM: 142 mmol/L (ref 134–144)
Total Protein: 7.2 g/dL (ref 6.0–8.5)

## 2016-04-08 LAB — CBC WITH DIFFERENTIAL/PLATELET
BASOS: 0 %
Basophils Absolute: 0 10*3/uL (ref 0.0–0.2)
EOS (ABSOLUTE): 0.1 10*3/uL (ref 0.0–0.4)
Eos: 1 %
Hematocrit: 36.9 % (ref 34.0–46.6)
Hemoglobin: 12.3 g/dL (ref 11.1–15.9)
Immature Grans (Abs): 0 10*3/uL (ref 0.0–0.1)
Immature Granulocytes: 0 %
LYMPHS: 39 %
Lymphocytes Absolute: 3 10*3/uL (ref 0.7–3.1)
MCH: 29.1 pg (ref 26.6–33.0)
MCHC: 33.3 g/dL (ref 31.5–35.7)
MCV: 87 fL (ref 79–97)
MONOCYTES: 10 %
Monocytes Absolute: 0.8 10*3/uL (ref 0.1–0.9)
NEUTROS ABS: 3.8 10*3/uL (ref 1.4–7.0)
Neutrophils: 50 %
Platelets: 219 10*3/uL (ref 150–379)
RBC: 4.23 x10E6/uL (ref 3.77–5.28)
RDW: 14.8 % (ref 12.3–15.4)
WBC: 7.7 10*3/uL (ref 3.4–10.8)

## 2016-04-08 NOTE — Telephone Encounter (Signed)
Pt states that someone had called them I do not see a message

## 2016-04-08 NOTE — Telephone Encounter (Signed)
I spoke with patient last night and advised that the ct scan was negative.  She will proceed to neurology given the new facial droop.  She has multiple risk factors that include smoking currently.  I have stressed the importance of smoking cessation, however I have stressed this in the past and she may not be able to stop.  She tells me she will this time. Fortunately she is medication compliant.  I'd like her on a statin to reduce her risk however there some documentation stating she is intolerant, and she also has an ASA allergy. Her most recent data reveals an 10 year ASCVD of 25%. This could be as low as 18% with smoking cessation and potentially 14% with statin initiation. I will be seeing her back next week and appreciate any encouragement for this changes via neurology.   BP Readings from Last 3 Encounters:  04/07/16 136/78  06/27/15 116/68  10/03/14 118/79   Lab Results  Component Value Date   HGBA1C 6.9 (H) 06/27/2015   Lab Results  Component Value Date   CHOL 153 10/03/2014   HDL 49 10/03/2014   LDLCALC 67 10/03/2014   TRIG 185 (H) 10/03/2014   CHOLHDL 3.1 10/03/2014

## 2016-04-12 ENCOUNTER — Telehealth: Payer: Self-pay | Admitting: Physician Assistant

## 2016-04-12 NOTE — Telephone Encounter (Signed)
Beth Stanley said to tell her to come in today and we would work her in.  Pt says there is no way she can get in today.  She scheduled for Monday @ 8 am.

## 2016-04-12 NOTE — Telephone Encounter (Signed)
CLARK - Pt is having blood in her urine now.  Please call. [phone] OR [phone] OR [phone].

## 2016-04-14 ENCOUNTER — Ambulatory Visit (INDEPENDENT_AMBULATORY_CARE_PROVIDER_SITE_OTHER): Payer: PPO | Admitting: Physician Assistant

## 2016-04-14 VITALS — BP 118/71 | HR 71 | Temp 98.1°F | Resp 14 | Ht 63.0 in | Wt 148.0 lb

## 2016-04-14 DIAGNOSIS — G629 Polyneuropathy, unspecified: Secondary | ICD-10-CM | POA: Diagnosis not present

## 2016-04-14 DIAGNOSIS — Z23 Encounter for immunization: Secondary | ICD-10-CM

## 2016-04-14 DIAGNOSIS — D229 Melanocytic nevi, unspecified: Secondary | ICD-10-CM | POA: Diagnosis not present

## 2016-04-14 DIAGNOSIS — R310 Gross hematuria: Secondary | ICD-10-CM | POA: Diagnosis not present

## 2016-04-14 DIAGNOSIS — L700 Acne vulgaris: Secondary | ICD-10-CM | POA: Diagnosis not present

## 2016-04-14 DIAGNOSIS — E1140 Type 2 diabetes mellitus with diabetic neuropathy, unspecified: Secondary | ICD-10-CM | POA: Diagnosis not present

## 2016-04-14 DIAGNOSIS — Z760 Encounter for issue of repeat prescription: Secondary | ICD-10-CM | POA: Diagnosis not present

## 2016-04-14 LAB — POCT URINALYSIS DIP (MANUAL ENTRY)
Bilirubin, UA: NEGATIVE
Glucose, UA: NEGATIVE
Ketones, POC UA: NEGATIVE
Nitrite, UA: NEGATIVE
PH UA: 5.5 (ref 5.0–8.0)
Protein Ur, POC: NEGATIVE
RBC UA: NEGATIVE
SPEC GRAV UA: 1.01 (ref 1.030–1.035)
UROBILINOGEN UA: 0.2 (ref ?–2.0)

## 2016-04-14 LAB — POC MICROSCOPIC URINALYSIS (UMFC): Mucus: ABSENT

## 2016-04-14 LAB — POCT GLYCOSYLATED HEMOGLOBIN (HGB A1C): Hemoglobin A1C: 7.2

## 2016-04-14 MED ORDER — CLINDAMYCIN PHOSPHATE 1 % EX GEL: Freq: Two times a day (BID) | CUTANEOUS | 6 refills | Status: DC

## 2016-04-14 MED ORDER — ALBUTEROL SULFATE HFA 108 (90 BASE) MCG/ACT IN AERS: 2.0000 | INHALATION_SPRAY | Freq: Four times a day (QID) | RESPIRATORY_TRACT | 5 refills | Status: DC | PRN

## 2016-04-14 MED ORDER — GABAPENTIN 300 MG PO CAPS: ORAL_CAPSULE | ORAL | 3 refills | Status: DC

## 2016-04-14 MED ORDER — CEPHALEXIN 500 MG PO CAPS: 500.0000 mg | ORAL_CAPSULE | Freq: Four times a day (QID) | ORAL | 5 refills | Status: DC

## 2016-04-14 NOTE — Progress Notes (Addendum)
04/14/2016 9:37 AM   DOB: 1950/05/08 / MRN: [id number]  SUBJECTIVE:  Beth Stanley is a 66 y.o. female presenting for urinary frequency, urgency and hematuria that started 3 days ago.  She has not been trying anything.   She has a history of well controlled diabetes.  She does smoke. She has well controlled HTN. Takes gabapentin occasionally for feet pain.    Health Maintenance Due  Topic  . DEXA SCAN   . MAMMOGRAM   . FOOT EXAM   . PNA vac Low Risk Adult (2 of 2 - PPSV23)  . HEMOGLOBIN A1C   . OPHTHALMOLOGY EXAM     Immunization History  Administered Date(s) Administered  . Influenza,inj,Quad PF,36+ Mos 10/03/2014  . Influenza-Unspecified 11/11/2013  . Pneumococcal Conjugate-13 10/03/2014  . Td 06/27/2015   She is allergic to aspirin and tape.   She  has no past medical history on file.    She  reports that she has been smoking.  She has never used smokeless tobacco. She reports that she does not drink alcohol or use drugs. She  has no sexual activity history on file. The patient  has a past surgical history that includes Total abdominal hysterectomy.  Her family history is not on file.  Review of Systems  Constitutional: Negative for chills, diaphoresis and fever.  Gastrointestinal: Negative for nausea.  Genitourinary: Positive for frequency, hematuria and urgency. Negative for dysuria.  Skin: Negative for rash.  Neurological: Negative for dizziness.    The problem list and medications were reviewed and updated by myself where necessary and exist elsewhere in the encounter.   OBJECTIVE:  BP 118/71   Pulse 71   Temp 98.1 F (36.7 C)   Resp 14   Ht 5\' 3"  (1.6 m)   Wt 148 lb (67.1 kg)   BMI 26.22 kg/m   Physical Exam  Constitutional: She is active.  Non-toxic appearance.  Cardiovascular: Normal rate.   Pulmonary/Chest: Effort normal. No tachypnea.  Neurological: She is alert.  Skin: Skin is warm and dry. She is not diaphoretic. No pallor.    Results for  orders placed or performed in visit on 04/14/16 (from the past 72 hour(s))  POCT urinalysis dipstick     Status: Abnormal   Collection Time: 04/14/16  8:29 AM  Result Value Ref Range   Color, UA yellow yellow   Clarity, UA clear clear   Glucose, UA negative negative   Bilirubin, UA negative negative   Ketones, POC UA negative negative   Spec Grav, UA 1.010 1.030 - 1.035   Blood, UA negative negative   pH, UA 5.5 5.0 - 8.0   Protein Ur, POC negative negative   Urobilinogen, UA 0.2 Negative - 2.0   Nitrite, UA Negative Negative   Leukocytes, UA Trace (A) Negative  POCT Microscopic Urinalysis ([facility])     Status: Abnormal   Collection Time: 04/14/16  8:35 AM  Result Value Ref Range   WBC,UR,HPF,POC Few (A) None WBC/hpf   RBC,UR,HPF,POC Few (A) None RBC/hpf   Bacteria Few (A) None, Too numerous to count   Mucus Absent Absent   Epithelial Cells, UR Per Microscopy None None, Too numerous to count cells/hpf  POCT glycosylated hemoglobin (Hb A1C)     Status: None   Collection Time: 04/14/16  9:15 AM  Result Value Ref Range   Hemoglobin A1C 7.2    Lab Results  Component Value Date   MICROALBUR 0.3 10/03/2014   Diabetic Foot Exam - Simple  Simple Foot Form Visual Inspection No deformities, no ulcerations, no other skin breakdown bilaterally:  Yes Sensation Testing See comments:  Yes Pulse Check Posterior Tibialis and Dorsalis pulse intact bilaterally:  Yes Comments Could feel every spot but on left big toe       ASSESSMENT AND PLAN:  Beth Stanley was seen today for hematuria.  Diagnoses and all orders for this visit:  Gross hematuria -     POCT urinalysis dipstick -     POCT Microscopic Urinalysis ([facility]) -     cephALEXin (KEFLEX) 500 MG capsule; Take 1 capsule (500 mg total) by mouth 4 (four) times daily. -     Urine culture  Suspicious nevus -     Ambulatory referral to Dermatology  Type 2 diabetes mellitus with diabetic neuropathy, without long-term current use of  insulin (HCC) -     POCT glycosylated hemoglobin (Hb A1C) -     HM Diabetes Foot Exam -     Microalbumin, urine  Need for vaccination against Streptococcus pneumoniae -     Pneumococcal polysaccharide vaccine 23-valent greater than or equal to 2yo subcutaneous/IM  Neuropathy (HCC) -     gabapentin (NEURONTIN) 300 MG capsule; Take 1 tablet each night for peripheral neuropathy  Medication refill:Albuterol and clindagel. Unfortunately we did not have time to discuss these medication today.  She will make an appointment for three months to discuss these.  I need to see her more often.  -     albuterol (PROVENTIL HFA;VENTOLIN HFA) 108 (90 Base) MCG/ACT inhaler; Inhale 2 puffs into the lungs every 6 (six) hours as needed for wheezing or shortness of breath (cough, shortness of breath or wheezing.).    The patient is advised to call or return to clinic if she does not see an improvement in symptoms, or to seek the care of the closest emergency department if she worsens with the above plan.   Beth Stanley, MHS, PA-C [facility] 04/14/2016 9:37 AM

## 2016-04-14 NOTE — Addendum Note (Signed)
Addended by: [name] on: 04/14/2016 03:57 PM   Modules accepted: Orders

## 2016-04-15 LAB — MICROALBUMIN, URINE: Microalbumin, Urine: <3 ug/mL

## 2016-04-16 ENCOUNTER — Ambulatory Visit (INDEPENDENT_AMBULATORY_CARE_PROVIDER_SITE_OTHER): Payer: PPO | Admitting: Neurology

## 2016-04-16 ENCOUNTER — Encounter: Payer: Self-pay | Admitting: Neurology

## 2016-04-16 VITALS — BP 113/67 | HR 74 | Ht 63.0 in | Wt 149.5 lb

## 2016-04-16 DIAGNOSIS — R202 Paresthesia of skin: Secondary | ICD-10-CM

## 2016-04-16 LAB — URINE CULTURE

## 2016-04-16 MED ORDER — CLOPIDOGREL BISULFATE 75 MG PO TABS: 75.0000 mg | ORAL_TABLET | Freq: Every day | ORAL | 3 refills | Status: DC

## 2016-04-16 NOTE — Patient Instructions (Signed)
We will check MRI of the brain and get a carotid doppler.  We will start plavix daily, stop smoking!

## 2016-04-16 NOTE — Telephone Encounter (Signed)
Beth Stanley. [name] wants to be reached at work to schedule: [phone].

## 2016-04-16 NOTE — Progress Notes (Signed)
Reason for visit: Left lower lip numbness  Referring physician: Dr. Sedonia Small is a 66 y.o. female  History of present illness:  Beth Stanley is a 66 year old right-handed white female with a history of diabetes and tobacco abuse. The patient noted onset suddenly of left lower lip numbness that began on 03/31/2016. The patient has had intermittent episodes of numbness that may come and go after several minutes since that time. The patient occasionally may have a sensation of crawling on the left cheek as well. She denies any other symptoms such as headache, vision changes, speech changes or swallowing problems. She has not noted any numbness or weakness on the arms or legs. She was seen by her primary care physician and a CT scan of the brain was done showing evidence of chronic moderate level small vessel ischemic changes. The patient has not been on low-dose aspirin, she claims that she cannot tolerate aspirin at all due to stomach upset. She has had some mild gait instability, she denies any difficulty controlling the bowels or the bladder. She is sent to this office for an evaluation.  Past Medical History:  Diagnosis Date  . Diabetes ([place])   . Non Hodgkin's lymphoma [facility])     Past Surgical History:  Procedure Laterality Date  . TOTAL ABDOMINAL HYSTERECTOMY      Family History  Problem Relation Age of Onset  . Diabetes Mother   . Lung cancer Sister   . Lung cancer Brother   . Lung cancer Brother     Social history:  reports that she has been smoking.  She has been smoking about 1.00 pack per day. She has never used smokeless tobacco. She reports that she drinks alcohol. She reports that she does not use drugs.  Medications:  Prior to Admission medications   Medication Sig Start Date End Date Taking? Authorizing Provider  albuterol (PROVENTIL HFA;VENTOLIN HFA) 108 (90 Base) MCG/ACT inhaler Inhale 2 puffs into the lungs every 6 (six) hours as needed for wheezing or  shortness of breath (cough, shortness of breath or wheezing.). 04/14/16  Yes [name], PA-C  ALPRAZolam Duanne Moron) 0.5 MG tablet Take 1/2 to 1 tabs as needed for panic. 04/03/16  Yes [name], PA-C  blood glucose meter kit and supplies KIT Test blood sugar once daily. Dx code: E11.9 06/27/15  Yes [name], PA-C  cephALEXin (KEFLEX) 500 MG capsule Take 1 capsule (500 mg total) by mouth 4 (four) times daily. 04/14/16  Yes [name], PA-C  clindamycin (CLINDAGEL) 1 % gel Apply topically 2 (two) times daily. 04/14/16  Yes [name], PA-C  gabapentin (NEURONTIN) 300 MG capsule Take 1 tablet each night for peripheral neuropathy 04/14/16  Yes [name], PA-C  glucose blood test strip Test blood sugar once daily. E11.9 06/27/15  Yes [name], PA-C  metFORMIN (GLUCOPHAGE) 500 MG tablet TAKE ONE (1) TABLET THREE (3) TIMES EACH DAY 03/12/16  Yes [name], PA-C  omeprazole (PRILOSEC) 20 MG capsule TAKE ONE (1) CAPSULE EACH DAY 06/27/15  Yes [name], PA-C  prochlorperazine (COMPAZINE) 10 MG tablet Take 1 tablet (10 mg total) by mouth every 8 (eight) hours as needed for nausea or vomiting. 02/09/16 02/08/17 Yes [name], PA-C  ramipril (ALTACE) 5 MG capsule Take 1 capsule (5 mg total) by mouth daily. One daily 06/27/15  Yes [name], PA-C  sertraline (ZOLOFT) 100 MG tablet One daily 06/27/15  Yes [name], PA-C  valACYclovir (VALTREX) 1000 MG tablet Take 0.5 tablets (500 mg total) by mouth daily. 06/27/15  Yes [name], PA-C      Allergies  Allergen Reactions  . Aspirin   . Tape     Makes Whelps on Skin    ROS:  Out of a complete 14 system review of symptoms, the patient complains only of the following symptoms, and all other reviewed systems are negative.  Eye pain Shortness of breath, wheezing Blood in the urine Restless legs  Blood pressure 113/67, pulse 74, height _0  (1.6 m), weight 149 lb 8 oz (67.8 kg).  Physical  Exam  General: The patient is alert and cooperative at the time of the examination.  Eyes: Pupils are equal, round, and reactive to light. Discs are flat bilaterally.  Neck: The neck is supple, no carotid bruits are noted.  Respiratory: The respiratory examination is clear.  Cardiovascular: The cardiovascular examination reveals a regular rate and rhythm, no obvious murmurs or rubs are noted.  Skin: Extremities are without significant edema.  Neurologic Exam  Mental status: The patient is alert and oriented x 3 at the time of the examination. The patient has apparent normal recent and remote memory, with an apparently normal attention span and concentration ability.  Cranial nerves: Facial symmetry is present. There is good sensation of the face to pinprick and soft touch bilaterally. The strength of the facial muscles and the muscles to head turning and shoulder shrug are normal bilaterally. Speech is well enunciated, no aphasia or dysarthria is noted. Extraocular movements are full. Visual fields are full. The tongue is midline, and the patient has symmetric elevation of the soft palate. No obvious hearing deficits are noted.  Motor: The motor testing reveals 5 over 5 strength of all 4 extremities. Good symmetric motor tone is noted throughout.  Sensory: Sensory testing is intact to pinprick, soft touch, vibration sensation, and position sense on all 4 extremities. No evidence of extinction is noted.  Coordination: Cerebellar testing reveals good finger-nose-finger and heel-to-shin bilaterally.  Gait and station: Gait is normal. Tandem gait is minimally unsteady. Romberg is negative. No drift is seen.  Reflexes: Deep tendon reflexes are symmetric and normal bilaterally, with exception that the knee jerk reflexes are brisk bilaterally. Toes are equivocal bilaterally.   Assessment/Plan:  1. Left lower lip numbness  2. Small vessel disease by CT brain  The patient does have risk  factors for cerebrovascular disease. She has developed some intermittent left lower lip numbness, she will be sent for MRI of the brain, carotid Doppler study, and she will be placed on Plavix. She is to stop smoking. She is on omeprazole which interacts with Plavix, the patient will stop this medication and may go on Zantac if she needs to. I will contact her concerning the results of the above testing procedures. The patient did have a dental procedure a week prior to onset of symptoms, but the procedure involved the right maxillary teeth.  Jill Alexanders MD 04/16/2016 8:43 AM  [facility] [address] [place], [place] [address]  Phone [phone] Fax [phone]

## 2016-04-19 ENCOUNTER — Other Ambulatory Visit: Payer: Self-pay | Admitting: Physician Assistant

## 2016-04-19 DIAGNOSIS — E119 Type 2 diabetes mellitus without complications: Secondary | ICD-10-CM

## 2016-04-22 ENCOUNTER — Other Ambulatory Visit: Payer: Self-pay | Admitting: Physician Assistant

## 2016-04-22 ENCOUNTER — Telehealth: Payer: Self-pay | Admitting: Physician Assistant

## 2016-04-22 DIAGNOSIS — R3129 Other microscopic hematuria: Secondary | ICD-10-CM

## 2016-04-22 DIAGNOSIS — Z87891 Personal history of nicotine dependence: Secondary | ICD-10-CM

## 2016-04-22 NOTE — Telephone Encounter (Signed)
I'm going to have her see urology for further workup. Please let her know.  Given her history of smoking they are likely going to talk to her about a bladder scope.    [name], MS, PA-C 9:21 PM, 04/22/2016

## 2016-04-22 NOTE — Progress Notes (Unsigned)
Referring to urology given no improvement on ABx.  She is a smoker so I imagine she would benefit from cystoscopy. [name], MS, PA-C 9:20 PM, 04/22/2016

## 2016-04-22 NOTE — Telephone Encounter (Signed)
PATIENT SAW Beth Stanley ON Monday FOR BLOOD IN HER URINE. Beth PRESCRIBED HER TO HAVE CEPHALEXIN (KEFLEX) 500 MG. SHE HAS TAKEN IT FOR 7 DAYS AND SHE STILL SEES SOME BLOOD. DOES Beth WANT HER TO START ANOTHER ROUND OF THE ANTIBOTIC? HE GAVE HER 5 REFILLS. BEST PHONE [phone] (WORK UNTIL 4:00) AFTER 4:00 [place] [phone]  [place]

## 2016-04-22 NOTE — Telephone Encounter (Signed)
Please advise, Ov? Sample? New rx?

## 2016-04-23 ENCOUNTER — Telehealth: Payer: Self-pay | Admitting: Physician Assistant

## 2016-04-23 DIAGNOSIS — E042 Nontoxic multinodular goiter: Secondary | ICD-10-CM

## 2016-04-23 NOTE — Telephone Encounter (Signed)
Called pt about referral to Dermatology. Pt scheduled with another Dermatologist with [facility] in [place] and would like [place] to cancel her appt with [place]. Pt also asked about previous message concerning her antibiotics and if she should take another round from the 5 rounds she was prescribed. Please advise. Pt callback number [phone] or [phone].

## 2016-04-23 NOTE — Telephone Encounter (Signed)
Pt called in wanting to know why she was referred to Urology Advised, urine results revealed hematuria

## 2016-04-23 NOTE — Telephone Encounter (Signed)
Left message to return message 

## 2016-04-23 NOTE — Telephone Encounter (Signed)
Pt is needing a phone call back as soon as possible regarding her kidneys  Best number [phone]

## 2016-04-24 NOTE — Telephone Encounter (Signed)
Lm with husband to see urology and she has an appt set up.

## 2016-04-29 DIAGNOSIS — L2089 Other atopic dermatitis: Secondary | ICD-10-CM | POA: Diagnosis not present

## 2016-04-29 DIAGNOSIS — L821 Other seborrheic keratosis: Secondary | ICD-10-CM | POA: Diagnosis not present

## 2016-04-29 DIAGNOSIS — L118 Other specified acantholytic disorders: Secondary | ICD-10-CM | POA: Diagnosis not present

## 2016-04-29 DIAGNOSIS — L570 Actinic keratosis: Secondary | ICD-10-CM | POA: Diagnosis not present

## 2016-04-29 DIAGNOSIS — D485 Neoplasm of uncertain behavior of skin: Secondary | ICD-10-CM | POA: Diagnosis not present

## 2016-04-29 NOTE — Telephone Encounter (Signed)
Can we please help her with this referral? Thank you. [name], MS, PA-C 8:11 AM, 04/29/2016

## 2016-04-29 NOTE — Telephone Encounter (Signed)
Pt stated that she has scheduled an apt with derm in [place], which I believe was with Roque Lias.

## 2016-04-30 ENCOUNTER — Ambulatory Visit
Admission: RE | Admit: 2016-04-30 | Discharge: 2016-04-30 | Disposition: A | Discharge status: Home / Self Care | Payer: PPO | Source: Ambulatory Visit | Attending: Neurology | Admitting: Neurology

## 2016-04-30 DIAGNOSIS — R202 Paresthesia of skin: Secondary | ICD-10-CM

## 2016-04-30 NOTE — Telephone Encounter (Signed)
Referral has been sent to [facility] .

## 2016-05-01 ENCOUNTER — Telehealth: Payer: Self-pay | Admitting: Family Medicine

## 2016-05-01 NOTE — Telephone Encounter (Signed)
Pt calling about MRI results. °

## 2016-05-02 ENCOUNTER — Other Ambulatory Visit: Payer: Self-pay | Admitting: Physician Assistant

## 2016-05-02 ENCOUNTER — Telehealth: Payer: Self-pay | Admitting: Physician Assistant

## 2016-05-02 DIAGNOSIS — E119 Type 2 diabetes mellitus without complications: Secondary | ICD-10-CM

## 2016-05-02 DIAGNOSIS — R112 Nausea with vomiting, unspecified: Secondary | ICD-10-CM

## 2016-05-02 MED ORDER — GLUCOSE BLOOD VI STRP: ORAL_STRIP | 1 refills | Status: DC

## 2016-05-02 NOTE — Telephone Encounter (Signed)
Pts husband called to request a refill of pts testing strips.  He states that it needs to be called in to the mail order pharmacy and will also give her a refund of $20 from some she had paid for out of pocket.  AFHSVEXO:[phone] YL:[phone]

## 2016-05-02 NOTE — Telephone Encounter (Signed)
Image in process. No results yet. Dr. Jannifer Franklin will be sharing the results with her once they result.

## 2016-05-04 ENCOUNTER — Telehealth: Payer: Self-pay | Admitting: Neurology

## 2016-05-04 NOTE — Telephone Encounter (Signed)
I called the patient. MRI does show a moderate level of SVD. No acute strokes are seen. She is to stay on plavix, control the blood pressure and diabetes, and quit smoking.   MRI brain 05/02/16:  IMPRESSION:  This MRI of the brain without contrast shows the following: 1.   Exntensive T2/FLAIR hyperintense foci in the hemispheres and pons most consistent with chronic microvascular ischemic change, more than expected for age. None of the changes appears to be acute. 2.   Mild cortical atrophy. 3.   Acute right maxillary sinusitis and minimal chronic ethmoid sinusitis.

## 2016-05-13 NOTE — Progress Notes (Signed)
05/14/2016 11:25 AM   Beth Stanley 12-05-1950 [id number]  Referring provider: [name], PA-C [place], [place] [address]  Chief Complaint  Patient presents with  . New Patient (Initial Visit)    microscopic hematuria referred by  [name]     HPI: Patient is a 66 -year-old Caucasian female who presents today as a referral from their PCP, [name], PA-C, for microscopic hematuria.    Patient was found to have microscopic hematuria on 04/14/2016 with few RBC's/hpf.  Patient does have a prior history of hematuria which was treated with an antibiotic and the hematuria abated.   She has noticed a pink tinge to her urine in her panty liners x 3 weeks.   She does not have a prior history of recurrent urinary tract infections, nephrolithiasis, trauma to the genitourinary tract or malignancies of the genitourinary tract.   She does not have a family medical history of nephrolithiasis, malignancies of the genitourinary tract or hematuria.   She does not have a family medical history of nephrolithiasis, malignancies of the genitourinary tract or hematuria.   Today, she/he are having/not having symptoms of frequent urination, urgency, dysuria, nocturia, incontinence, hesitancy, intermittency, straining to urinate or a weak urinary stream.  Her UA today is unremarkable.    She is not experiencing any suprapubic pain, abdominal pain or flank pain.  She denies any recent fevers, chills, nausea or vomiting.   She has not had any recent imaging studies.   She is a smoker.   She is a Emergency planning/management officer.      PMH: Past Medical History:  Diagnosis Date  . Anxiety   . Arthritis   . Diabetes ([place])   . Heartburn   . HTN (hypertension)   . Non Hodgkin's lymphoma [name])     Surgical History: Past Surgical History:  Procedure Laterality Date  . TOTAL ABDOMINAL HYSTERECTOMY      Home Medications:  Allergies as of 05/14/2016      Reactions   Aspirin    Tape    Makes Whelps on Skin      Medication List       Accurate as of 05/14/16 11:25 AM. Always use your most recent med list.          albuterol 108 (90 Base) MCG/ACT inhaler Commonly known as:  PROVENTIL HFA;VENTOLIN HFA Inhale 2 puffs into the lungs every 6 (six) hours as needed for wheezing or shortness of breath (cough, shortness of breath or wheezing.).   ALPRAZolam 0.5 MG tablet Commonly known as:  XANAX Take 1/2 to 1 tabs as needed for panic.   blood glucose meter kit and supplies Kit Test blood sugar once daily. Dx code: E11.9   cephALEXin 500 MG capsule Commonly known as:  KEFLEX Take 1 capsule (500 mg total) by mouth 4 (four) times daily.   clindamycin 1 % gel Commonly known as:  CLINDAGEL Apply topically 2 (two) times daily.   clopidogrel 75 MG tablet Commonly known as:  PLAVIX Take 1 tablet (75 mg total) by mouth daily.   gabapentin 300 MG capsule Commonly known as:  NEURONTIN Take 1 tablet each night for peripheral neuropathy   glucose blood test strip Test blood sugar once daily. E11.9   metFORMIN 500 MG tablet Commonly known as:  GLUCOPHAGE TAKE ONE TABLET 3 TIMES DAILY   omeprazole 20 MG capsule Commonly known as:  PRILOSEC TAKE ONE (1) CAPSULE EACH DAY   prochlorperazine 10 MG  tablet Commonly known as:  COMPAZINE TAKE ONE TABLET BY MOUTH EVERY EIGHT HOURS AS NEEDED FOR NAUSEA AND VOMITING   ramipril 5 MG capsule Commonly known as:  ALTACE Take 1 capsule (5 mg total) by mouth daily. One daily   sertraline 100 MG tablet Commonly known as:  ZOLOFT One daily   valACYclovir 1000 MG tablet Commonly known as:  VALTREX Take 0.5 tablets (500 mg total) by mouth daily.       Allergies:  Allergies  Allergen Reactions  . Aspirin   . Tape     Makes Whelps on Skin    Family History: Family History  Problem Relation Age of Onset  . Diabetes Mother   . Lung cancer Sister   . Lung cancer Brother   . Kidney cancer Brother     removed kidney     . Lung cancer Brother   . Prostate cancer Neg Hx   . Bladder Cancer Neg Hx     Social History:  reports that she has been smoking.  She has been smoking about 1.00 pack per day. She has never used smokeless tobacco. She reports that she drinks alcohol. She reports that she does not use drugs.  ROS: UROLOGY Frequent Urination?: No Hard to postpone urination?: No Burning/pain with urination?: No Get up at night to urinate?: Yes Leakage of urine?: No Urine stream starts and stops?: No Trouble starting stream?: Yes Do you have to strain to urinate?: No Blood in urine?: No Urinary tract infection?: No Sexually transmitted disease?: No Injury to kidneys or bladder?: No Painful intercourse?: No Weak stream?: No Currently pregnant?: No Vaginal bleeding?: No Last menstrual period?: n  Gastrointestinal Nausea?: No Vomiting?: No Indigestion/heartburn?: No Diarrhea?: No Constipation?: No  Constitutional Fever: No Night sweats?: No Weight loss?: No Fatigue?: No  Skin Skin rash/lesions?: No Itching?: No  Eyes Blurred vision?: No Double vision?: No  Ears/Nose/Throat Sore throat?: No Sinus problems?: No  Hematologic/Lymphatic Swollen glands?: No Easy bruising?: No  Cardiovascular Leg swelling?: No Chest pain?: No  Respiratory Cough?: No Shortness of breath?: No  Endocrine Excessive thirst?: No  Musculoskeletal Back pain?: No Joint pain?: No  Neurological Headaches?: No Dizziness?: No  Psychologic Depression?: No Anxiety?: No  Physical Exam: BP 104/62   Pulse 87   Ht '5\' 3"'  (1.6 m)   Wt 149 lb 9.6 oz (67.9 kg)   BMI 26.50 kg/m   Constitutional: Well nourished. Alert and oriented, No acute distress. HEENT: [place] AT, moist mucus membranes. Trachea midline, no masses. Cardiovascular: No clubbing, cyanosis, or edema. Respiratory: Normal respiratory effort, no increased work of breathing. GI: Abdomen is soft, non tender, non distended, no abdominal  masses. Liver and spleen not palpable.  No hernias appreciated.  Stool sample for occult testing is not indicated.   GU: No CVA tenderness.  No bladder fullness or masses.   Skin: No rashes, bruises or suspicious lesions. Lymph: No cervical or inguinal adenopathy. Neurologic: Grossly intact, no focal deficits, moving all 4 extremities. Psychiatric: Normal mood and affect.  Laboratory Data: Lab Results  Component Value Date   WBC 7.7 04/07/2016   HGB 12.3 06/27/2015   HCT 36.9 04/07/2016   MCV 87 04/07/2016   PLT 219 04/07/2016    Lab Results  Component Value Date   CREATININE 0.66 04/07/2016     Lab Results  Component Value Date   HGBA1C 7.2 04/14/2016    Lab Results  Component Value Date   TSH 0.681 02/03/2012  Component Value Date/Time   CHOL 153 10/03/2014 1244   HDL 49 10/03/2014 1244   CHOLHDL 3.1 10/03/2014 1244   VLDL 37 (H) 10/03/2014 1244   LDLCALC 67 10/03/2014 1244    Lab Results  Component Value Date   AST 19 04/07/2016   Lab Results  Component Value Date   ALT 18 04/07/2016     Urinalysis Unremarkable.  See EPIC.     Assessment & Plan:    1. Gross hematuria  - I explained to the patient that there are a number of causes that can be associated with blood in the urine, such as stones, UTI's, damage to the urinary tract and/or cancer.  - At this time, I felt that the patient warranted further urologic evaluation.   The AUA guidelines state that a CT urogram is the preferred imaging study to evaluate hematuria.  - I explained to the patient that a contrast material will be injected into a vein and that in rare instances, an allergic reaction can result and may even life threatening   The patient denies any allergies to contrast, iodine and/or seafood and is taking metformin.  - Her reproductive status is s/p hysterectomy   - Following the imaging study,  I've recommended a cystoscopy. I described how this is performed, typically in an  office setting with a flexible cystoscope. We described the risks, benefits, and possible side effects, the most common of which is a minor amount of blood in the urine and/or burning which usually resolves in 24 to 48 hours.    - The patient had the opportunity to ask questions which were answered. Based upon this discussion, the patient is willing to proceed. Therefore, I've ordered: a CT Urogram and cystoscopy.  - The patient will return following all of the above for discussion of the results.   - UA  - Urine culture  - BUN + creatinine      Return for CT Urogram report and cystoscopy.  These notes generated with voice recognition software. I apologize for typographical errors.  Zara Council, [facility] [address] [place], [place] [address] [phone]

## 2016-05-14 ENCOUNTER — Encounter: Payer: Self-pay | Admitting: Urology

## 2016-05-14 ENCOUNTER — Ambulatory Visit (INDEPENDENT_AMBULATORY_CARE_PROVIDER_SITE_OTHER): Payer: PPO | Admitting: Urology

## 2016-05-14 VITALS — BP 104/62 | HR 87 | Ht 63.0 in | Wt 149.6 lb

## 2016-05-14 DIAGNOSIS — R3129 Other microscopic hematuria: Secondary | ICD-10-CM | POA: Diagnosis not present

## 2016-05-14 DIAGNOSIS — R310 Gross hematuria: Secondary | ICD-10-CM

## 2016-05-14 LAB — URINALYSIS, COMPLETE
Bilirubin, UA: NEGATIVE
GLUCOSE, UA: NEGATIVE
Leukocytes, UA: NEGATIVE
NITRITE UA: NEGATIVE
PH UA: 5.5 (ref 5.0–7.5)
Protein, UA: NEGATIVE
RBC, UA: NEGATIVE
Specific Gravity, UA: 1.025 (ref 1.005–1.030)
UUROB: 0.2 mg/dL (ref 0.2–1.0)

## 2016-05-14 LAB — MICROSCOPIC EXAMINATION: RBC, UA: NONE SEEN /hpf (ref 0–?)

## 2016-05-15 LAB — BUN+CREAT
BUN/Creatinine Ratio: 18 (ref 12–28)
BUN: 13 mg/dL (ref 8–27)
CREATININE: 0.73 mg/dL (ref 0.57–1.00)
GFR calc Af Amer: 99 mL/min/{1.73_m2} (ref >59)
GFR, EST NON AFRICAN AMERICAN: 86 mL/min/{1.73_m2} (ref >59)

## 2016-05-16 NOTE — Progress Notes (Signed)
Thank you for your care. [name], MS, PA-C 2:49 PM, 05/16/2016

## 2016-05-17 LAB — CULTURE, URINE COMPREHENSIVE

## 2016-05-20 ENCOUNTER — Ambulatory Visit (HOSPITAL_COMMUNITY)
Admission: RE | Admit: 2016-05-20 | Discharge: 2016-05-20 | Disposition: A | Discharge status: Home / Self Care | Payer: PPO | Source: Ambulatory Visit | Attending: Neurology | Admitting: Neurology

## 2016-05-20 ENCOUNTER — Telehealth: Payer: Self-pay | Admitting: Neurology

## 2016-05-20 DIAGNOSIS — E042 Nontoxic multinodular goiter: Secondary | ICD-10-CM | POA: Diagnosis not present

## 2016-05-20 DIAGNOSIS — R202 Paresthesia of skin: Secondary | ICD-10-CM | POA: Diagnosis not present

## 2016-05-20 DIAGNOSIS — R599 Enlarged lymph nodes, unspecified: Secondary | ICD-10-CM | POA: Insufficient documentation

## 2016-05-20 LAB — VAS US CAROTID
LCCADDIAS: -27 cm/s
LCCADSYS: -84 cm/s
LCCAPSYS: 120 cm/s
LEFT ECA DIAS: -16 cm/s
LEFT VERTEBRAL DIAS: -26 cm/s
Left CCA prox dias: 29 cm/s
Left ICA dist dias: -39 cm/s
Left ICA dist sys: -105 cm/s
Left ICA prox dias: -29 cm/s
Left ICA prox sys: -85 cm/s
RCCADSYS: -70 cm/s
RCCAPSYS: 100 cm/s
RIGHT ECA DIAS: -18 cm/s
RIGHT VERTEBRAL DIAS: -19 cm/s
Right CCA prox dias: 28 cm/s

## 2016-05-20 NOTE — Progress Notes (Signed)
*  PRELIMINARY RESULTS* Vascular Ultrasound Carotid Duplex (Doppler) has been completed.  Preliminary findings: Bilateral 1-39% ICA stenosis, antegrade vertebral flow.   Everrett Coombe 05/20/2016, 9:27 AM

## 2016-05-20 NOTE — Telephone Encounter (Signed)
I called the patient. The carotid Doppler study is unremarkable. This study shows evidence of bilateral thyroid nodules, left greater than right. The patient indicates that she has no prior knowledge of this. This may require further investigation.  The patient will contact her primary care doctor concerning this.   Carotid doppler 05/20/16:  Summary:  - The vertebral arteries appear patent with antegrade flow. - Findings consistent with a 1-39 percent stenosis involving the right internal carotid artery and the left internal carotid artery. - Prominent lymph nodes seen bilaterally. Thyroid nodules seen bilaterally, largest left thyroid nodule measured.

## 2016-05-22 NOTE — Telephone Encounter (Signed)
Given recent ultrasound findings of thyroid nodules along with lymphadenopathy will go ahead and CT neck.  Thyroid studies pending. [name], MS, PA-C 6:49 PM, 05/22/2016

## 2016-05-26 ENCOUNTER — Ambulatory Visit (INDEPENDENT_AMBULATORY_CARE_PROVIDER_SITE_OTHER): Payer: PPO | Admitting: Physician Assistant

## 2016-05-26 ENCOUNTER — Encounter: Payer: Self-pay | Admitting: Physician Assistant

## 2016-05-26 ENCOUNTER — Telehealth: Payer: Self-pay | Admitting: Physician Assistant

## 2016-05-26 VITALS — BP 108/72 | HR 80 | Temp 98.0°F | Resp 18 | Ht 63.78 in | Wt 148.2 lb

## 2016-05-26 DIAGNOSIS — L739 Follicular disorder, unspecified: Secondary | ICD-10-CM

## 2016-05-26 DIAGNOSIS — F17200 Nicotine dependence, unspecified, uncomplicated: Secondary | ICD-10-CM

## 2016-05-26 DIAGNOSIS — E042 Nontoxic multinodular goiter: Secondary | ICD-10-CM | POA: Diagnosis not present

## 2016-05-26 DIAGNOSIS — K1379 Other lesions of oral mucosa: Secondary | ICD-10-CM

## 2016-05-26 DIAGNOSIS — K2100 Gastro-esophageal reflux disease with esophagitis, without bleeding: Secondary | ICD-10-CM

## 2016-05-26 DIAGNOSIS — R590 Localized enlarged lymph nodes: Secondary | ICD-10-CM

## 2016-05-26 MED ORDER — NICOTINE 10 MG IN INHA: 1.0000 | RESPIRATORY_TRACT | 0 refills | Status: DC | PRN

## 2016-05-26 MED ORDER — LIDOCAINE VISCOUS 2 % MT SOLN: 20.0000 mL | OROMUCOSAL | 0 refills | Status: DC | PRN

## 2016-05-26 MED ORDER — DOXYCYCLINE HYCLATE 100 MG PO CAPS
100.0000 mg | ORAL_CAPSULE | Freq: Two times a day (BID) | ORAL | 0 refills | Status: DC
Start: 2016-05-26 — End: 2016-06-05

## 2016-05-26 MED ORDER — FAMOTIDINE 40 MG PO TABS: 40.0000 mg | ORAL_TABLET | Freq: Every day | ORAL | 3 refills | Status: DC

## 2016-05-26 NOTE — Telephone Encounter (Signed)
Please advise 

## 2016-05-26 NOTE — Progress Notes (Signed)
06/02/2016 9:56 AM   DOB: 01/07/1951 / MRN: [id number]  SUBJECTIVE:  Beth Stanley is a 66 y.o. female presenting for follow up of thyorid nodules.  Had an ultrasound done by neurology show some mild stenosis of the carotids along with some lyphadenopathy.  I have ordered a CT scan of the neck to illuminate both the nodules and the lymphadenopathy.    She is here today complaining of spots in her mouth.  She does have a history of GERD and has her PPI has recently been stopped due to starting Plavix per her neurologist.  I have stressed the importance of smoking cessation and advised that we try nicotrol. She wants to see if this works.  She has had chantix in the past and this did not work.   She is allergic to aspirin and tape.   She  has a past medical history of Anxiety; Arthritis; Diabetes (Beth Stanley); Heartburn; HTN (hypertension); and Non Hodgkin's lymphoma (Beth Stanley) (2004).    She  reports that she has been smoking.  She has been smoking about 1.00 pack per day. She has never used smokeless tobacco. She reports that she drinks alcohol. She reports that she does not use drugs. She  has no sexual activity history on file. The patient  has a past surgical history that includes Total abdominal hysterectomy.  Her family history includes Diabetes in her mother; Kidney cancer in her brother; Lung cancer in her brother, brother, and sister.  Review of Systems  Constitutional: Negative for chills and fever.  HENT: Negative for hearing loss.   Respiratory: Negative for cough.   Cardiovascular: Negative for chest pain.  Gastrointestinal: Negative for nausea.  Musculoskeletal: Negative for myalgias.  Skin: Negative for rash.  Neurological: Negative for dizziness.  Psychiatric/Behavioral: Negative for depression.    The problem list and medications were reviewed and updated by myself where necessary and exist elsewhere in the encounter.   OBJECTIVE:  BP 108/72   Pulse 80   Temp 98 F (36.7 C)  (Oral)   Resp 18   Ht 5' 3.78" (1.62 m)   Wt 148 lb 3.2 oz (67.2 kg)   SpO2 97%   BMI 25.61 kg/m   Physical Exam  Constitutional: She is active.  Non-toxic appearance.  HENT:  Right Ear: Hearing, tympanic membrane, external ear and ear canal normal.  Left Ear: Hearing, tympanic membrane, external ear and ear canal normal.  Nose: Nose normal. Right sinus exhibits no maxillary sinus tenderness and no frontal sinus tenderness. Left sinus exhibits no maxillary sinus tenderness and no frontal sinus tenderness.  Mouth/Throat: Uvula is midline, oropharynx is clear and moist and mucous membranes are normal. Mucous membranes are not dry. No oropharyngeal exudate, posterior oropharyngeal edema or tonsillar abscesses.  Negative for oral lesions  Cardiovascular: Normal rate.   Pulmonary/Chest: Effort normal. No stridor. No tachypnea. No respiratory distress. She has no wheezes. She has no rales.  Genitourinary:     Lymphadenopathy:       Head (right side): No submandibular and no tonsillar adenopathy present.       Head (left side): No submandibular and no tonsillar adenopathy present.    She has no cervical adenopathy.  Neurological: She is alert.  Skin: Skin is warm and dry. She is not diaphoretic. No pallor.    Wt Readings from Last 3 Encounters:  05/26/16 148 lb 3.2 oz (67.2 kg)  05/14/16 149 lb 9.6 oz (67.9 kg)  04/16/16 149 lb 8 oz (67.8 kg)  Lab Results  Component Value Date   TSH 2.090 05/26/2016   Lab Results  Component Value Date   WBC 7.7 04/07/2016   Lab Results  Component Value Date   HGBA1C 7.2 04/14/2016   Lab Results  Component Value Date   CREATININE 0.73 05/14/2016   BUN 13 05/14/2016   NA 142 04/07/2016   K 4.0 04/07/2016   CL 101 04/07/2016   CO2 26 04/07/2016   Lab Results  Component Value Date   ALT 18 04/07/2016   AST 19 04/07/2016   ALKPHOS 88 04/07/2016   BILITOT <0.2 04/07/2016   Lab Results  Component Value Date   WBC 7.7 04/07/2016    HGB 12.3 06/27/2015   HCT 36.9 04/07/2016   MCV 87 04/07/2016   PLT 219 04/07/2016       No results found for this or any previous visit (from the past 72 hour(s)).  No results found.  ASSESSMENT AND PLAN:  Beth Stanley was seen today for mouth lesions.  Diagnoses and all orders for this visit:  Gastroesophageal reflux disease with esophagitis -     famotidine (PEPCID) 40 MG tablet; Take 1 tablet (40 mg total) by mouth at bedtime.  Multiple thyroid nodules: Given lympadenopahy awaiting CT scan.  -     TSH -     Thyroid peroxidase antibody -     T4, Free  Oral pain of unknown etiology: See exam.  -     lidocaine (XYLOCAINE) 2 % solution; Use as directed 20 mLs in the mouth or throat as needed for mouth pain.  Folliculitis -     WOUND CULTURE -     doxycycline (VIBRAMYCIN) 100 MG capsule; Take 1 capsule (100 mg total) by mouth 2 (two) times daily.  Smoker -     nicotine (NICOTROL) 10 MG inhaler; Inhale 1 Cartridge (1 continuous puffing total) into the lungs as needed for smoking cessation.    The patient is advised to call or return to clinic if she does not see an improvement in symptoms, or to seek the care of the closest emergency department if she worsens with the above plan.   [name], MHS, PA-C [facility] 06/02/2016 9:56 AM

## 2016-05-26 NOTE — Telephone Encounter (Signed)
Pt's husband Doloris Servantes calling in reference to pt's upcoming CT's that have been ordered. Pt's husband said nodules were found on pt throat and she is concerned about this and would like to have a full body scan rather than the individual CT Scans ordered. Pt's husband said she currently has scans ordered for her bladder, neck, and one other. He wanted to know if it was possible today or very soon for pt to have a full body scan instead. I do see that authorization has been sent to pt's insurance company for CT of the neck and we are waiting for approval for this. Please advise. Pt's husband Merissa Renwick is on DPR and his number is [phone]. Thank you!

## 2016-05-26 NOTE — Patient Instructions (Signed)
     IF you received an x-ray today, you will receive an invoice from [facility]. Please contact [facility] at [phone] with questions or concerns regarding your invoice.   IF you received labwork today, you will receive an invoice from [facility]. Please contact [facility] at [phone] with questions or concerns regarding your invoice.   Our billing staff will not be able to assist you with questions regarding bills from these companies.  You will be contacted with the lab results as soon as they are available. The fastest way to get your results is to activate your My Chart account. Instructions are located on the last page of this paperwork. If you have not heard from us regarding the results in 2 weeks, please contact this office.     

## 2016-05-27 LAB — T4, FREE: Free T4: 1.12 ng/dL (ref 0.82–1.77)

## 2016-05-27 LAB — THYROID PEROXIDASE ANTIBODY: Thyroperoxidase Ab SerPl-aCnc: 12 IU/mL (ref 0–34)

## 2016-05-27 LAB — TSH: TSH: 2.09 u[IU]/mL (ref 0.450–4.500)

## 2016-05-28 NOTE — Telephone Encounter (Signed)
PATIENT STATES SHE WILL HAVE HER CT SCAN ON HER KIDNEYS Monday 06/02/2016 AT [facility]. SHE WOULD LIKE MICHAEL TO PUT IN A STAT ORDER FOR THEM TO SCAN HER THYROID ALSO. THIS WAY SHE WON'T HAVE TO MAKE A SEPARATE TRIP. SHE SAID TO PLEASE CALL HER BACK TO LET HER KNOW AS SOON AS POSSIBLE. BEST PHONE [phone] (CELL) MBC

## 2016-05-28 NOTE — Telephone Encounter (Signed)
Waiting on prior auth approval for insurance before pt can be scheduled for CT. I called pt's insurance company again to follow up on this and they said status is still pending and they will fax over whether it has been approved or not as soon as they can. I let them know we were trying to schedule the pt on 06/02/16. I spoke with pt's husband who called concerning this to let him know we are waiting on approval.

## 2016-05-28 NOTE — Telephone Encounter (Signed)
Thank you Clarise Cruz. And thank you for calling them back so promptly.

## 2016-05-28 NOTE — Telephone Encounter (Signed)
Can we try to honor this request? Thanks -[facility]

## 2016-05-29 LAB — WOUND CULTURE

## 2016-06-02 ENCOUNTER — Other Ambulatory Visit: Payer: Self-pay | Admitting: Physician Assistant

## 2016-06-02 ENCOUNTER — Ambulatory Visit
Admission: RE | Admit: 2016-06-02 | Discharge: 2016-06-02 | Disposition: A | Discharge status: Home / Self Care | Payer: PPO | Source: Ambulatory Visit | Attending: Urology | Admitting: Urology

## 2016-06-02 DIAGNOSIS — Z90710 Acquired absence of both cervix and uterus: Secondary | ICD-10-CM | POA: Insufficient documentation

## 2016-06-02 DIAGNOSIS — R590 Localized enlarged lymph nodes: Secondary | ICD-10-CM

## 2016-06-02 DIAGNOSIS — Z8572 Personal history of non-Hodgkin lymphomas: Secondary | ICD-10-CM | POA: Diagnosis not present

## 2016-06-02 DIAGNOSIS — I700 Atherosclerosis of aorta: Secondary | ICD-10-CM | POA: Insufficient documentation

## 2016-06-02 DIAGNOSIS — E042 Nontoxic multinodular goiter: Secondary | ICD-10-CM | POA: Diagnosis not present

## 2016-06-02 DIAGNOSIS — R310 Gross hematuria: Secondary | ICD-10-CM | POA: Insufficient documentation

## 2016-06-02 IMAGING — CT CT ABD-PEL WO/W CM
2 of 6 series · 13 of 32 positions shown, 18 images · IV contrast (APPLIED)
Comparison: CT abdomen [DATE]

CLINICAL DATA: Gross hematuria. Low abdominal pain for 1 month.
History of non-Hodgkin's lymphoma.

EXAM:
CT ABDOMEN AND PELVIS WITHOUT AND WITH CONTRAST
TECHNIQUE: Multidetector CT imaging of the abdomen and pelvis was performed
following the standard protocol before and following the bolus
administration of intravenous contrast.
CONTRAST:  125mL [QK] IOPAMIDOL ([QK]) INJECTION 61%

[Series 8: axial pre. · axial · non-contrast · 0.69mm/px · z∈[-768,-458]mm · 6 of 88 slices shown]
[im 13/88  soft-tissue]
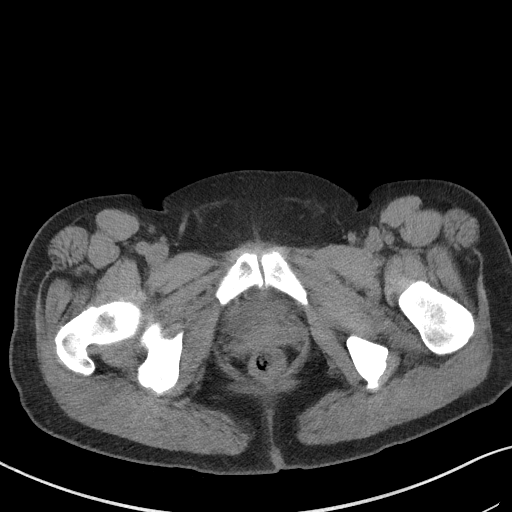
[im 25/88  soft-tissue]
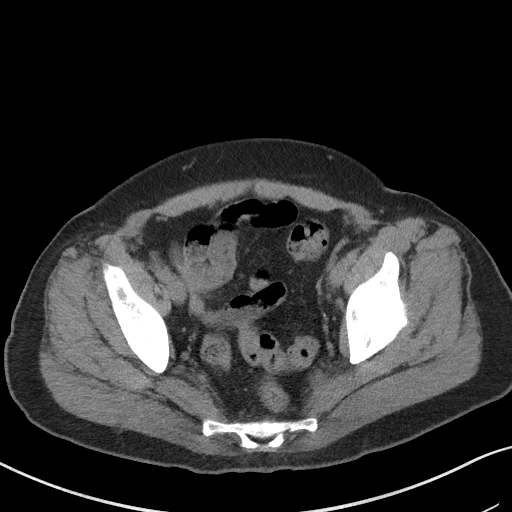
[im 38/88  soft-tissue]
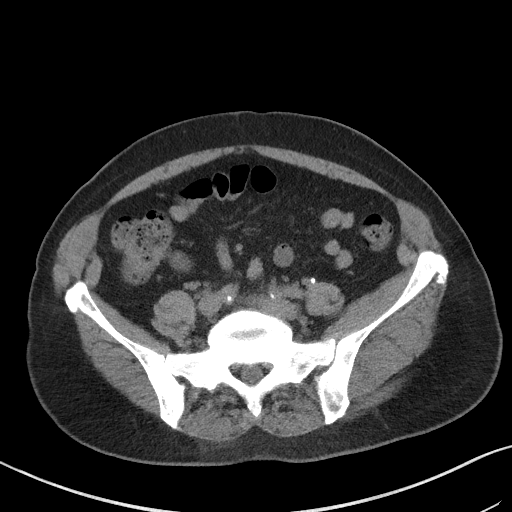
[im 50/88  soft-tissue]
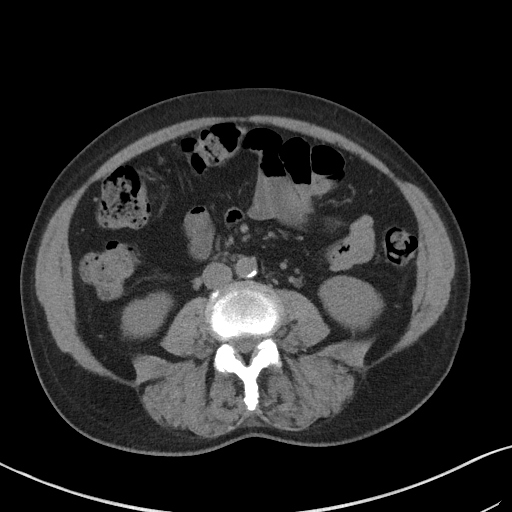
[im 63/88  soft-tissue]
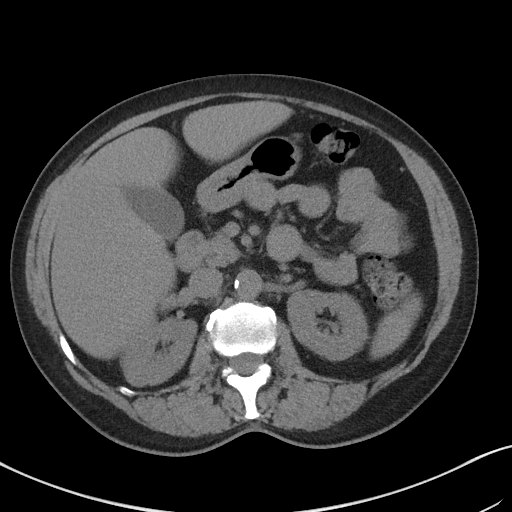
[im 75/88  soft-tissue]
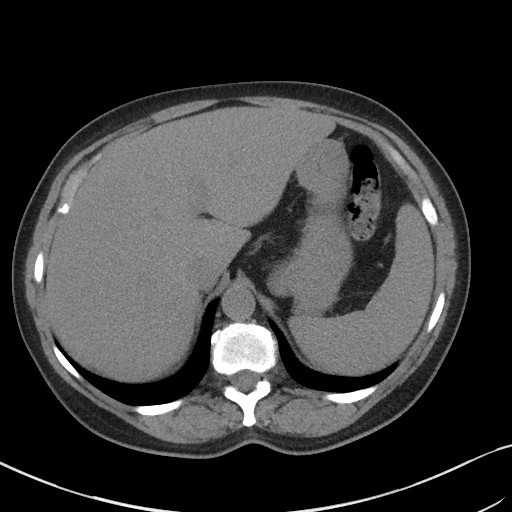

[Series 15: axial delay- · axial · delayed · 0.70mm/px · z∈[-846,-486]mm · 7 of 97 slices shown, 12 images]
[im 13/97  soft-tissue]
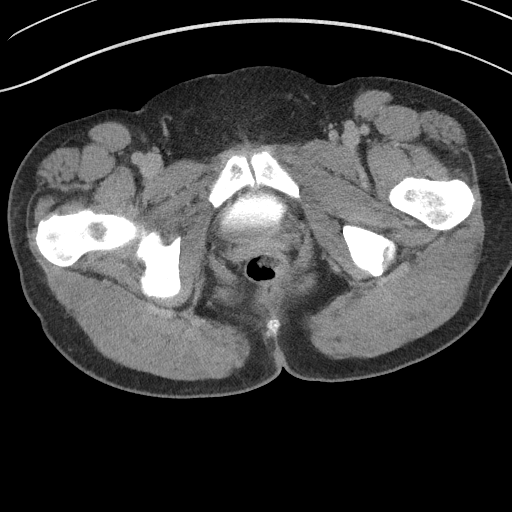
[im 13/97  bone]
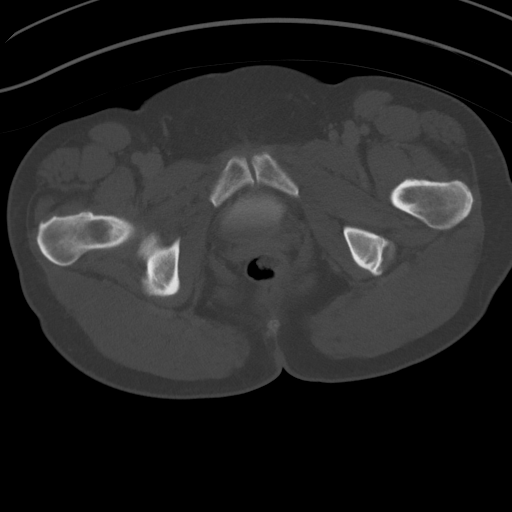
[im 25/97  soft-tissue]
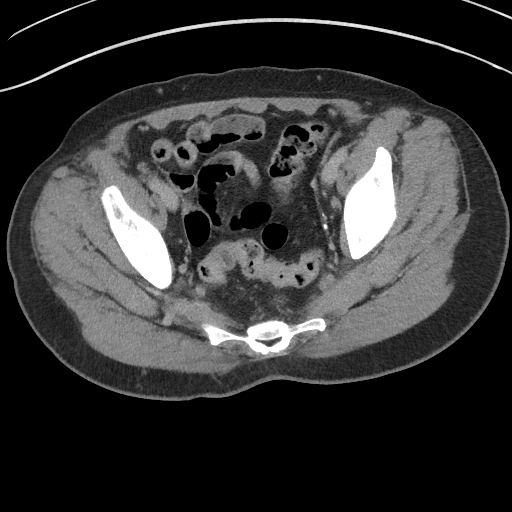
[im 37/97  soft-tissue]
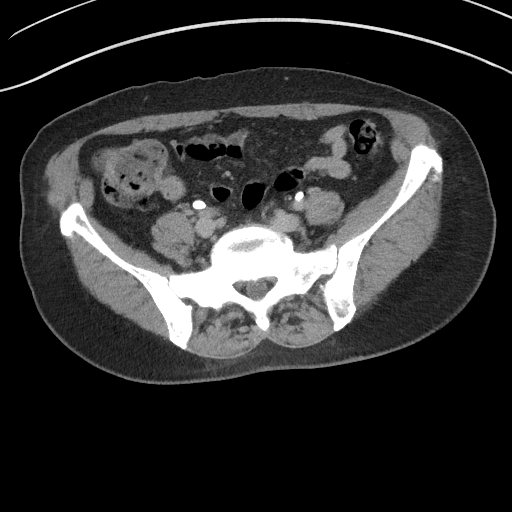
[im 49/97  soft-tissue]
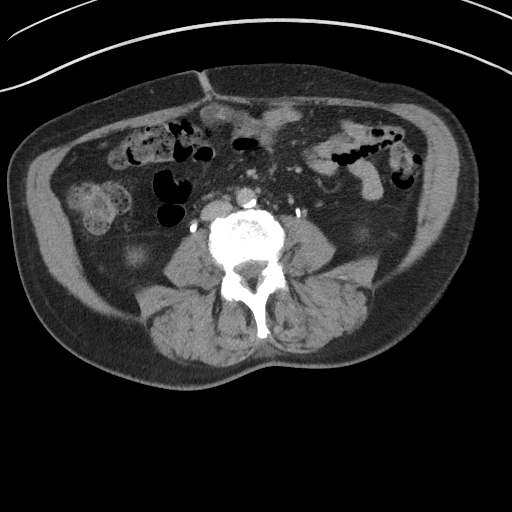
[im 49/97  lung]
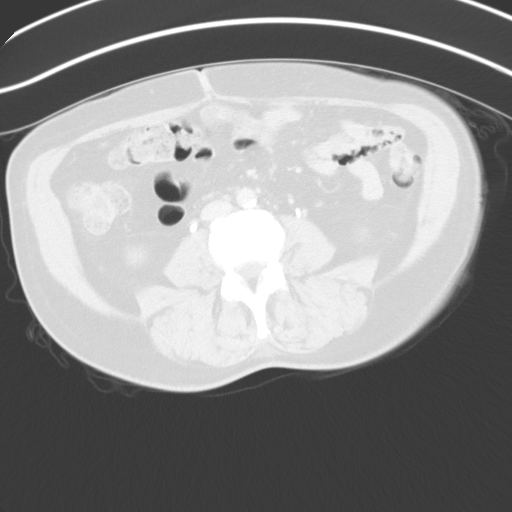
[im 61/97  soft-tissue]
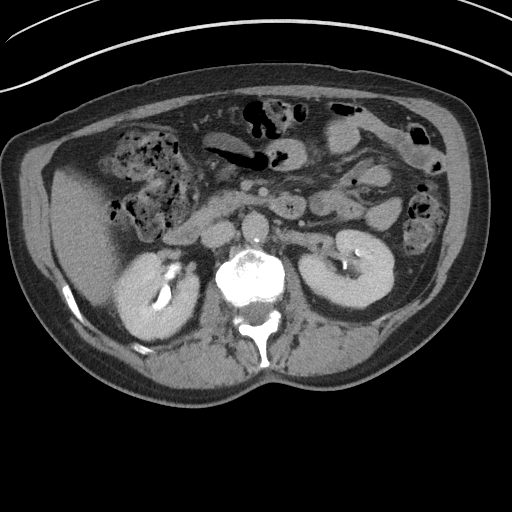
[im 61/97  lung]
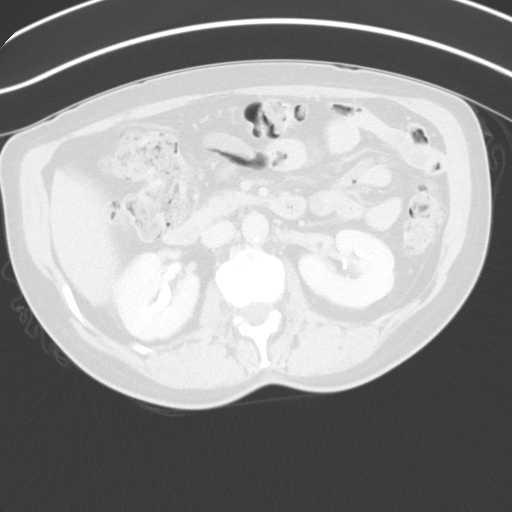
[im 73/97  soft-tissue]
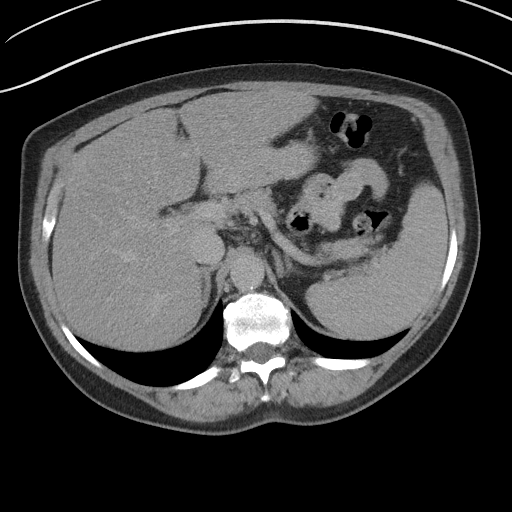
[im 73/97  lung]
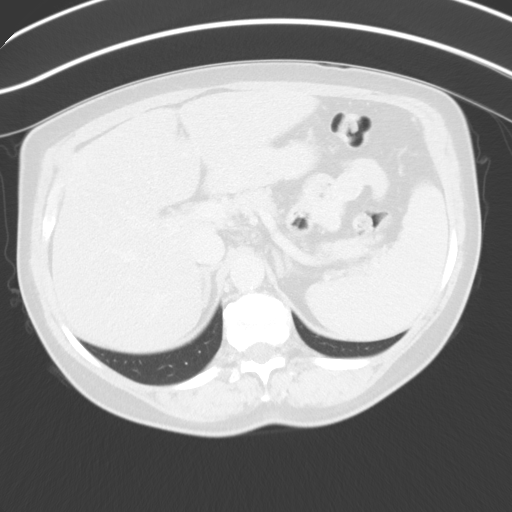
[im 85/97  soft-tissue]
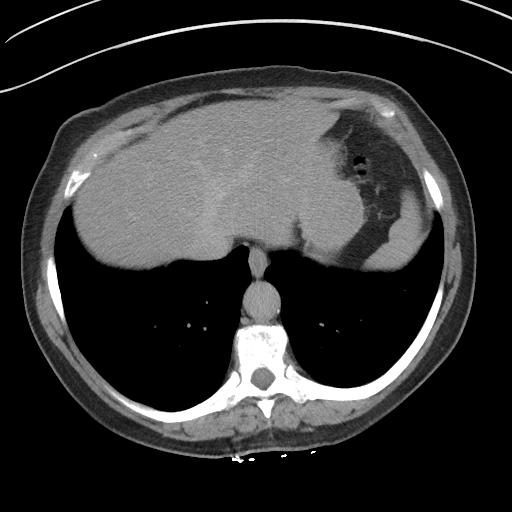
[im 85/97  lung]
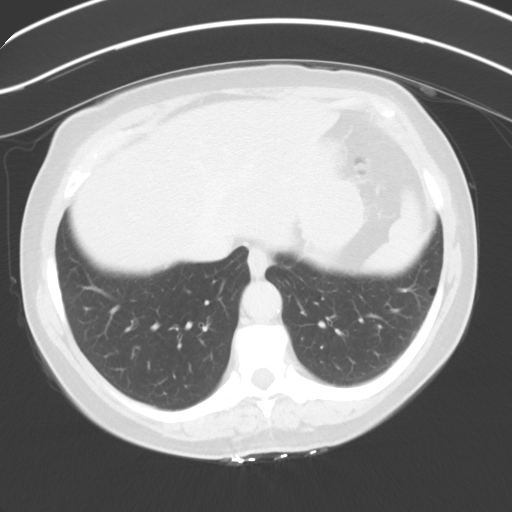

[13 of 32 positions shown; findings below may reference images not displayed]

FINDINGS: Lower chest: Lung bases are clear.

Hepatobiliary: No focal hepatic lesion. No biliary duct dilatation.
Gallbladder is normal. Common bile duct is normal.

Pancreas: Pancreas is normal. No ductal dilatation. No pancreatic
inflammation.

Spleen: Normal spleen

Adrenals/urinary tract: Adrenal glands are normal

No nephrolithiasis or ureterolithiasis. No enhancing renal cortical
lesion. No filling defects within collecting systems or ureters. No
bladder calculi, enhancing bladder lesions, or filling defect within
the bladder.

Stomach/Bowel: Stomach, small-bowel and cecum are normal. The
appendix is not identified but there is no pericecal inflammation to
suggest appendicitis. The colon and rectosigmoid colon are normal.

Vascular/Lymphatic: Abdominal aorta is normal caliber with
atherosclerotic calcification. There is no retroperitoneal or
periportal lymphadenopathy. No pelvic lymphadenopathy.

Reproductive: Post hysterectomy.

Other: No free fluid.

Musculoskeletal: No aggressive osseous lesion.
IMPRESSION: 1. No explanation for hematuria. No nephrolithiasis,
ureterolithiasis, enhancing renal cortical lesion, or filling
defects within the collecting systems.
2. No bladder stones or filling defects in the bladder which does
not excluded a bladder lesion.
3. No lymphadenopathy.
4.  Atherosclerotic calcification of the aorta.

## 2016-06-02 IMAGING — CT CT NECK W/ CM
2 of 3 series · 8 of 14 positions shown, 9 images · IV contrast (APPLIED)
Comparison: Chest CT [DATE].

CLINICAL DATA: Multiple thyroid nodules. Enlarged lymph nodes on
recent carotid ultrasound. Dysphagia for 1 month. History of
non-Hodgkin's lymphoma status post chemotherapy.

EXAM:
CT NECK WITH CONTRAST
TECHNIQUE: Multidetector CT imaging of the neck was performed using the
standard protocol following the bolus administration of intravenous
contrast.
CONTRAST:  125mL [X4] IOPAMIDOL ([X4]) INJECTION 61%

[Series 3: axial neck · axial · 0.55mm/px · z∈[-264,-116]mm · 4 of 124 slices shown]
[im 25/124  bone]
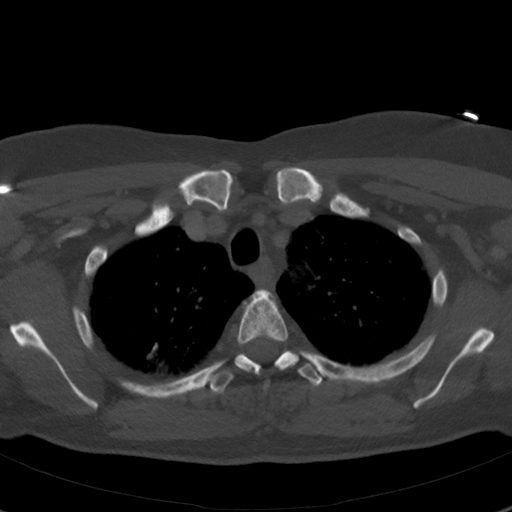
[im 50/124  bone]
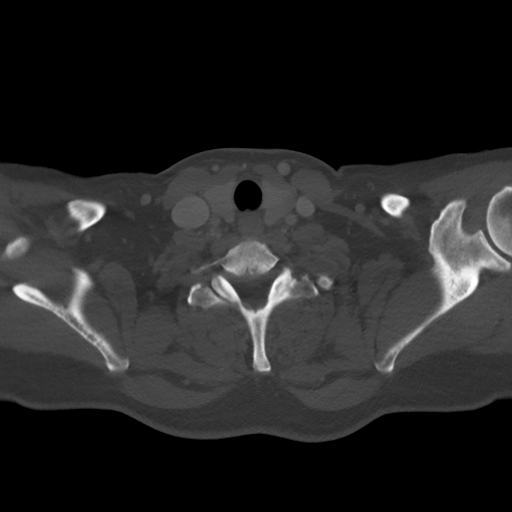
[im 74/124  bone]
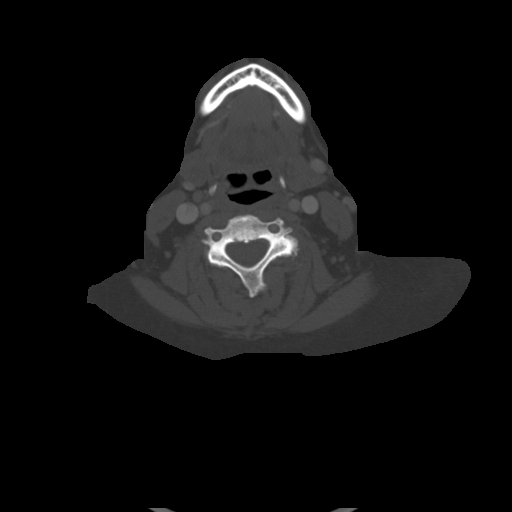
[im 99/124  bone]
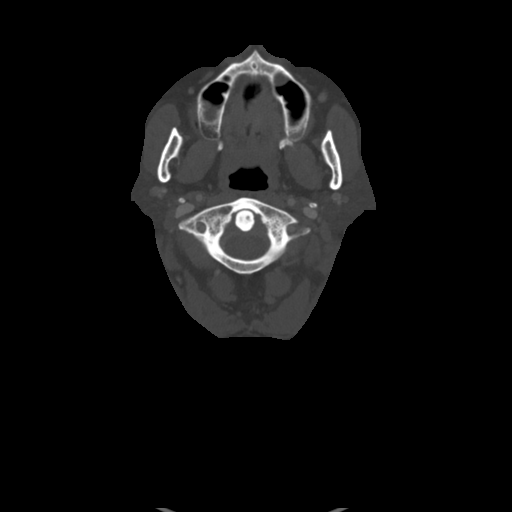

[Series 16: orthogonal ax · axial · 0.46mm/px · z∈[-307,-125]mm · 4 of 155 slices shown, 5 images]
[im 31/155  soft-tissue]
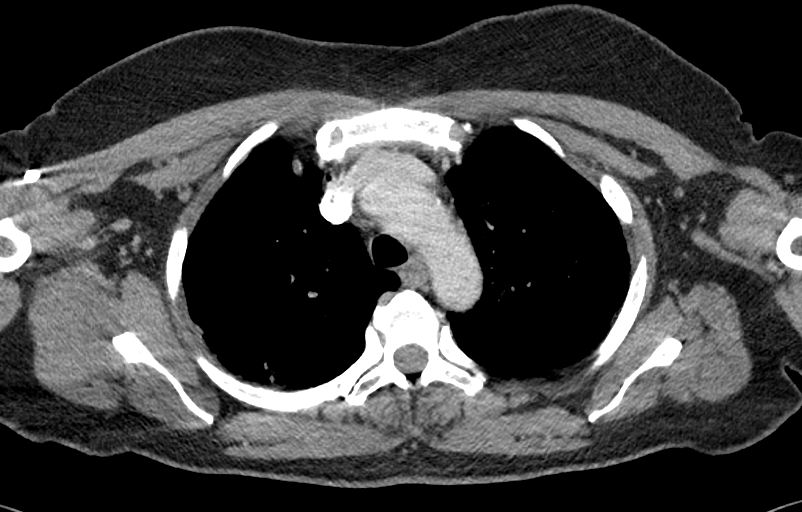
[im 31/155  bone]
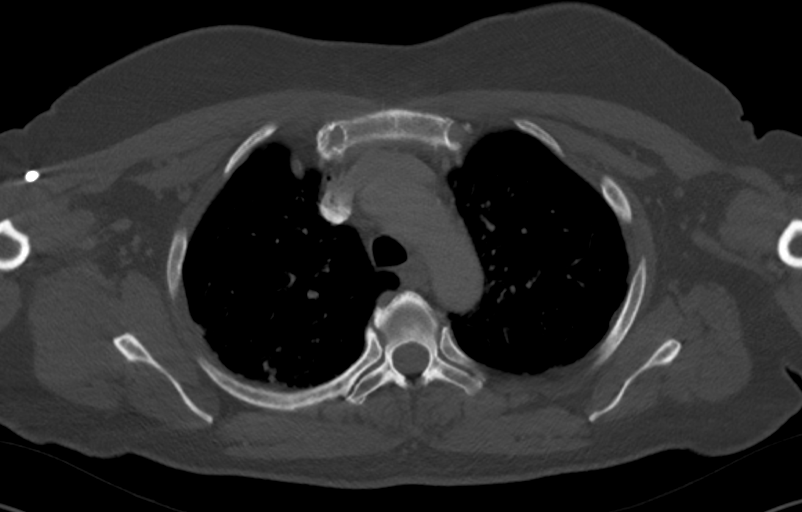
[im 62/155  bone]
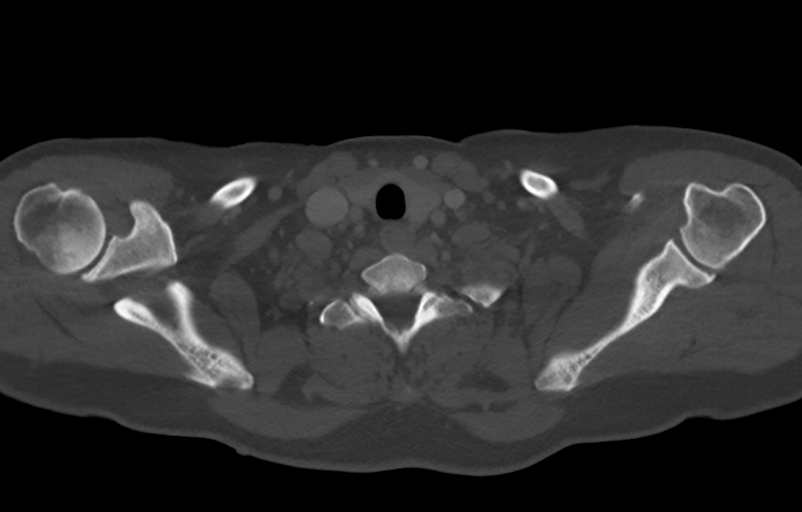
[im 93/155  bone]
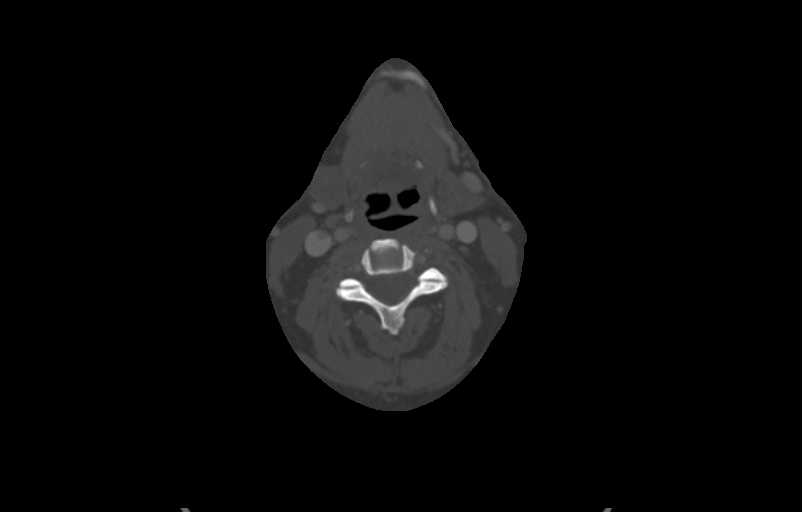
[im 124/155  bone]
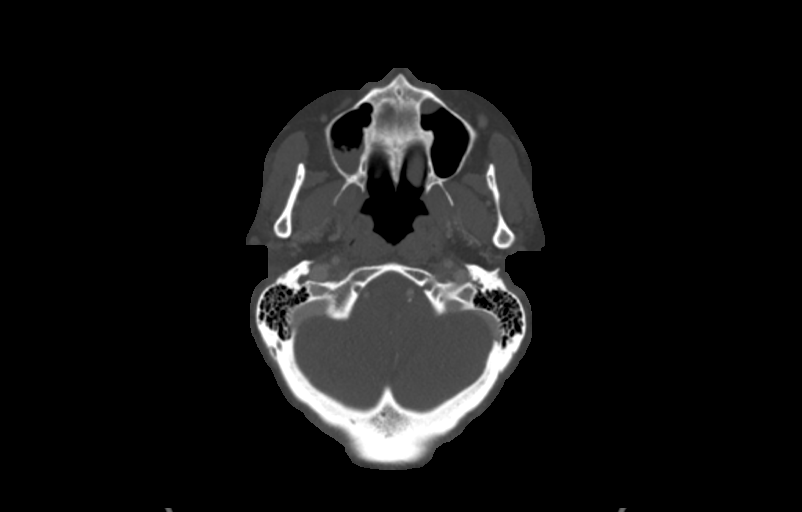

[8 of 14 positions shown; findings below may reference images not displayed]

FINDINGS: Pharynx and larynx: Symmetric pharyngeal and laryngeal soft tissues
without mass. No parapharyngeal/ retropharyngeal inflammatory change
or fluid.

Salivary glands: No inflammation, mass, or stone.

Thyroid: 9 mm low-density nodule in the left thyroid lobe. Slight
right lobe heterogeneity without sizable nodule apparent on CT.

Lymph nodes: An enlarged right subpectoral lymph node measures 12 mm
in short axis and is new from the prior chest CT. A 9 mm short axis
right supraclavicular lymph node is also new. There is an increased
number of small left supraclavicular lymph nodes measuring up to 6
mm in short axis. Symmetric, normal sized level II lymph nodes are
present bilaterally.

Vascular: Mild, predominantly soft plaque about both carotid
bifurcations without evidence of flow limiting stenosis.

Limited intracranial: Unremarkable.

Visualized orbits: Unremarkable.

Mastoids and visualized paranasal sinuses: Moderate volume bubbly
fluid in the right maxillary sinus. Small left maxillary sinus
mucous retention cyst. Clear mastoid air cells.

Skeleton: No suspicious osseous lesion.

Upper chest: Moderate centrilobular emphysema. Progressive scarring
in the lung apices with calcification, right slightly greater than
left. The most notable area of progression is in the right apex and
has a more nodular configuration, measuring 1.5 cm in diameter
(series 11, image 26).

Other: None.
IMPRESSION: 1. New mild right subpectoral and bilateral supraclavicular
lymphadenopathy, indeterminate though lymphoproliferative disease
and metastatic disease are considerations.
2. Progressive scarring in the lung apices with a 1.5 cm more
nodular focus on the right. Consider further evaluation with PET-CT
or follow-up chest CT in 3 months.

## 2016-06-02 MED ORDER — IOPAMIDOL (ISOVUE-300) INJECTION 61%
125.0000 mL | Freq: Once | INTRAVENOUS | Status: AC | PRN
  Administered 2016-06-02: 125 mL via INTRAVENOUS

## 2016-06-02 NOTE — Telephone Encounter (Signed)
-----   Message from Logan Bores, MD sent at 06/02/2016  5:32 PM EDT ----- I cannot see the images from her carotid ultrasound, but speaking generally we wouldn't routinely recommend a dedicated thyroid ultrasound follow-up unless there was a nodule 1.5 cm or larger or a nodule with suspicious imaging features. I would not recommend thyroid ultrasound for her based on my CT findings. Let me know if I can be of further help.  ----- Message ----- From: [name], PA-C Sent: 06/02/2016   1:44 PM To: Logan Bores, MD  Dr. Zenia Resides,  Thank you for your read on Ms. Blagg's CT neck.  Given her history of cancer I am going to order the PET and refer to onc now for further management.  From what I can see on the CT scan it looks like she is not meeting criteria for FNA and her serum thyroid studies are normal.  She did have a US of the neck however this was done to evaluated stenosis and the study does little to evaluate the nodules. Would you mind providing me with your best recommendation given the thyroid nodule?  Are you able to see the actual [place] image for comparison purposes?  Thank you for your care.    [name], MS, PA-C 1:44 PM, 06/02/2016

## 2016-06-02 NOTE — Progress Notes (Signed)
Patient with history of non-Hodkin's lymphoma and very long history of smoking.  I am going to advise that she see you or one of your colleagues.  I will go ahead and order the pet scan. Updated CBC, Lipase, and CMET in process. Please send me any other screening you would like done. Thank you for your care. [name], MS, PA-C 1:38 PM, 06/02/2016

## 2016-06-02 NOTE — Telephone Encounter (Signed)
patients husband called wanting Beth Stanley to give him a call to discuss his wife's situation. His cell phone number is [phone] they are going over to the daughters house so if you can not get them on the cell phone please call her number it is [phone], his name is Beth Stanley

## 2016-06-03 ENCOUNTER — Telehealth: Payer: Self-pay | Admitting: Family Medicine

## 2016-06-03 NOTE — Telephone Encounter (Signed)
fyi

## 2016-06-03 NOTE — Telephone Encounter (Signed)
PT HUSBAND CALLING TO SPEAK WITH MICHAEL HE HAS SOME QUESTIONS ABOUT RESULTS

## 2016-06-03 NOTE — Telephone Encounter (Signed)
I've called and spoken with both Mr and Beth Stanley. PET CT PENDING.

## 2016-06-04 NOTE — Telephone Encounter (Signed)
Called pt and spoke with husband following up on authorization for CT of the neck. Pt's husband said they received auth and had CT done on 5/21. Pt said PET Scan had been ordered and Shelly from another office was working on [place]. Did another provider order PET as well or is this something we need to authorize? Pt's husband also wanted to clarify that pt wanted full body scan.

## 2016-06-05 ENCOUNTER — Ambulatory Visit (INDEPENDENT_AMBULATORY_CARE_PROVIDER_SITE_OTHER): Payer: PPO | Admitting: Urology

## 2016-06-05 ENCOUNTER — Encounter: Payer: Self-pay | Admitting: Urology

## 2016-06-05 VITALS — BP 128/76 | HR 72 | Ht 63.78 in | Wt 145.9 lb

## 2016-06-05 DIAGNOSIS — R310 Gross hematuria: Secondary | ICD-10-CM

## 2016-06-05 LAB — URINALYSIS, COMPLETE
Bilirubin, UA: NEGATIVE
Glucose, UA: NEGATIVE
Ketones, UA: NEGATIVE
Leukocytes, UA: NEGATIVE
Nitrite, UA: NEGATIVE
PH UA: 5.5 (ref 5.0–7.5)
PROTEIN UA: NEGATIVE
RBC, UA: NEGATIVE
Specific Gravity, UA: <1.005 — ABNORMAL LOW (ref 1.005–1.030)
Urobilinogen, Ur: 0.2 mg/dL (ref 0.2–1.0)

## 2016-06-05 MED ORDER — CIPROFLOXACIN HCL 500 MG PO TABS
500.0000 mg | ORAL_TABLET | Freq: Once | ORAL | Status: AC
  Administered 2016-06-05: 500 mg via ORAL

## 2016-06-05 MED ORDER — LIDOCAINE HCL 2 % EX GEL
1.0000 "application " | Freq: Once | CUTANEOUS | Status: AC
  Administered 2016-06-05: 1 via URETHRAL

## 2016-06-05 NOTE — Telephone Encounter (Signed)
Can we speak with Beth Stanley just so there is no miscommunication and so we can document what Beth Stanley says? Patient has an oncologist from [facility] due to a history of lymphoma.  She is requesting her scan been done there as well to keep all of her oncology records. I would recommend we speak with Shelly to find out if there is anything that we need to do. [name], MS, PA-C 8:37 AM, 06/05/2016

## 2016-06-05 NOTE — Telephone Encounter (Signed)
Thank you Clarise Cruz. [name], MS, PA-C 8:37 AM, 06/05/2016

## 2016-06-05 NOTE — Progress Notes (Signed)
   06/05/16  CC:  Chief Complaint  Patient presents with  . Cysto  -Gross Hematuria  HPI: The patient is a 66 year old female presents today for completion of her gross hematuria workup. She has intermittent light pink urine. No dark red urine or clots. CT urogram was unremarkable.  She is a smoker and a Emergency planning/management officer.  She does have a history of stage IV lymphoma that was treated with chemotherapy approximately 14 years ago.  There were no vitals taken for this visit. NED. A&Ox3.   No respiratory distress   Abd soft, NT, ND Normal external genitalia with patent urethral meatus  Cystoscopy Procedure Note  Patient identification was confirmed, informed consent was obtained, and patient was prepped using Betadine solution.  Lidocaine jelly was administered per urethral meatus.    Preoperative abx where received prior to procedure.    Procedure: - Flexible cystoscope introduced, without any difficulty.   - Thorough search of the bladder revealed:    normal urethral meatus    normal urothelium    no stones    no ulcers     no tumors    no urethral polyps    no trabeculation  - Ureteral orifices were normal in position and appearance.  Post-Procedure: - Patient tolerated the procedure well  Assessment/ Plan:  1. Gross hematuria -Negative work up. Follow up in one year for repeat urinalysis.   Nickie Retort, MD

## 2016-06-10 NOTE — Telephone Encounter (Signed)
CLARK - Pt has another question about one of the latest scans.  Please call

## 2016-06-11 NOTE — Telephone Encounter (Signed)
Spoke with pt's husband and [facility] and authorization has not yet been obtained. Will start on this request today. Pt scheduled for pet scan on 6/8

## 2016-06-11 NOTE — Telephone Encounter (Signed)
[facility] Scheduling department to let them know I was sumbitting pre-auth for PET Scan and to get their NPI. Scheduler told me a pre-auth had already been initiated by their office yesterday due to one of their PA's ordering a PET Scan and they were handling the pre-auth. PET is scheduled for 06/20/16.

## 2016-06-20 DIAGNOSIS — M25559 Pain in unspecified hip: Secondary | ICD-10-CM | POA: Diagnosis not present

## 2016-06-20 DIAGNOSIS — C8510 Unspecified B-cell lymphoma, unspecified site: Secondary | ICD-10-CM | POA: Diagnosis not present

## 2016-06-20 DIAGNOSIS — R61 Generalized hyperhidrosis: Secondary | ICD-10-CM | POA: Diagnosis not present

## 2016-06-20 DIAGNOSIS — I10 Essential (primary) hypertension: Secondary | ICD-10-CM | POA: Diagnosis not present

## 2016-06-20 DIAGNOSIS — C8580 Other specified types of non-Hodgkin lymphoma, unspecified site: Secondary | ICD-10-CM | POA: Diagnosis not present

## 2016-06-20 DIAGNOSIS — N6320 Unspecified lump in the left breast, unspecified quadrant: Secondary | ICD-10-CM | POA: Diagnosis not present

## 2016-06-20 DIAGNOSIS — R739 Hyperglycemia, unspecified: Secondary | ICD-10-CM | POA: Diagnosis not present

## 2016-06-20 DIAGNOSIS — F418 Other specified anxiety disorders: Secondary | ICD-10-CM | POA: Diagnosis not present

## 2016-06-20 DIAGNOSIS — G9009 Other idiopathic peripheral autonomic neuropathy: Secondary | ICD-10-CM | POA: Diagnosis not present

## 2016-06-20 DIAGNOSIS — R911 Solitary pulmonary nodule: Secondary | ICD-10-CM | POA: Diagnosis not present

## 2016-06-20 DIAGNOSIS — R42 Dizziness and giddiness: Secondary | ICD-10-CM | POA: Diagnosis not present

## 2016-06-20 DIAGNOSIS — M791 Myalgia: Secondary | ICD-10-CM | POA: Diagnosis not present

## 2016-06-20 DIAGNOSIS — Z7902 Long term (current) use of antithrombotics/antiplatelets: Secondary | ICD-10-CM | POA: Diagnosis not present

## 2016-06-20 DIAGNOSIS — Z7982 Long term (current) use of aspirin: Secondary | ICD-10-CM | POA: Diagnosis not present

## 2016-06-20 DIAGNOSIS — F17210 Nicotine dependence, cigarettes, uncomplicated: Secondary | ICD-10-CM | POA: Diagnosis not present

## 2016-06-20 DIAGNOSIS — M25519 Pain in unspecified shoulder: Secondary | ICD-10-CM | POA: Diagnosis not present

## 2016-06-20 DIAGNOSIS — R1031 Right lower quadrant pain: Secondary | ICD-10-CM | POA: Diagnosis not present

## 2016-06-20 DIAGNOSIS — Z720 Tobacco use: Secondary | ICD-10-CM | POA: Diagnosis not present

## 2016-06-20 DIAGNOSIS — Z8572 Personal history of non-Hodgkin lymphomas: Secondary | ICD-10-CM | POA: Diagnosis not present

## 2016-06-20 DIAGNOSIS — G629 Polyneuropathy, unspecified: Secondary | ICD-10-CM | POA: Diagnosis not present

## 2016-06-20 DIAGNOSIS — R590 Localized enlarged lymph nodes: Secondary | ICD-10-CM | POA: Diagnosis not present

## 2016-06-20 DIAGNOSIS — R1032 Left lower quadrant pain: Secondary | ICD-10-CM | POA: Diagnosis not present

## 2016-06-20 DIAGNOSIS — G8929 Other chronic pain: Secondary | ICD-10-CM | POA: Diagnosis not present

## 2016-06-25 ENCOUNTER — Other Ambulatory Visit: Payer: Self-pay | Admitting: Physician Assistant

## 2016-06-25 DIAGNOSIS — F32A Depression, unspecified: Secondary | ICD-10-CM

## 2016-06-25 DIAGNOSIS — F329 Major depressive disorder, single episode, unspecified: Secondary | ICD-10-CM

## 2016-07-02 DIAGNOSIS — C8339 Diffuse large B-cell lymphoma, extranodal and solid organ sites: Secondary | ICD-10-CM | POA: Diagnosis not present

## 2016-07-02 DIAGNOSIS — C8510 Unspecified B-cell lymphoma, unspecified site: Secondary | ICD-10-CM | POA: Diagnosis not present

## 2016-07-02 DIAGNOSIS — C8330 Diffuse large B-cell lymphoma, unspecified site: Secondary | ICD-10-CM | POA: Diagnosis not present

## 2016-07-02 DIAGNOSIS — M899 Disorder of bone, unspecified: Secondary | ICD-10-CM | POA: Diagnosis not present

## 2016-07-09 ENCOUNTER — Telehealth: Payer: Self-pay | Admitting: Family Medicine

## 2016-07-09 ENCOUNTER — Other Ambulatory Visit: Payer: Self-pay | Admitting: Physician Assistant

## 2016-07-09 DIAGNOSIS — M898X9 Other specified disorders of bone, unspecified site: Secondary | ICD-10-CM

## 2016-07-09 MED ORDER — TRAMADOL HCL 50 MG PO TABS: 25.0000 mg | ORAL_TABLET | Freq: Three times a day (TID) | ORAL | 1 refills | Status: DC | PRN

## 2016-07-09 NOTE — Telephone Encounter (Signed)
PT HUSBAND CALLED WANTING [name] TO GIVE HIM A CLL CONCERNING PT BONE TEST THAT WAS DONE AT [facility]

## 2016-07-09 NOTE — Telephone Encounter (Signed)
Please advise 

## 2016-07-15 DIAGNOSIS — Z1379 Encounter for other screening for genetic and chromosomal anomalies: Secondary | ICD-10-CM | POA: Diagnosis not present

## 2016-07-15 DIAGNOSIS — C8330 Diffuse large B-cell lymphoma, unspecified site: Secondary | ICD-10-CM | POA: Diagnosis not present

## 2016-07-15 DIAGNOSIS — C8258 Diffuse follicle center lymphoma, lymph nodes of multiple sites: Secondary | ICD-10-CM | POA: Diagnosis not present

## 2016-07-15 DIAGNOSIS — Z77098 Contact with and (suspected) exposure to other hazardous, chiefly nonmedicinal, chemicals: Secondary | ICD-10-CM | POA: Diagnosis not present

## 2016-07-15 DIAGNOSIS — D464 Refractory anemia, unspecified: Secondary | ICD-10-CM | POA: Diagnosis not present

## 2016-07-24 DIAGNOSIS — C8335 Diffuse large B-cell lymphoma, lymph nodes of inguinal region and lower limb: Secondary | ICD-10-CM | POA: Diagnosis not present

## 2016-07-24 DIAGNOSIS — Z7902 Long term (current) use of antithrombotics/antiplatelets: Secondary | ICD-10-CM | POA: Diagnosis not present

## 2016-07-24 DIAGNOSIS — Z91040 Latex allergy status: Secondary | ICD-10-CM | POA: Diagnosis not present

## 2016-07-24 DIAGNOSIS — F329 Major depressive disorder, single episode, unspecified: Secondary | ICD-10-CM | POA: Diagnosis not present

## 2016-07-24 DIAGNOSIS — Z79899 Other long term (current) drug therapy: Secondary | ICD-10-CM | POA: Diagnosis not present

## 2016-07-24 DIAGNOSIS — Z886 Allergy status to analgesic agent status: Secondary | ICD-10-CM | POA: Diagnosis not present

## 2016-07-24 DIAGNOSIS — I6523 Occlusion and stenosis of bilateral carotid arteries: Secondary | ICD-10-CM | POA: Insufficient documentation

## 2016-07-24 DIAGNOSIS — Z7984 Long term (current) use of oral hypoglycemic drugs: Secondary | ICD-10-CM | POA: Diagnosis not present

## 2016-07-24 DIAGNOSIS — C8338 Diffuse large B-cell lymphoma, lymph nodes of multiple sites: Secondary | ICD-10-CM | POA: Diagnosis not present

## 2016-07-24 DIAGNOSIS — Z5111 Encounter for antineoplastic chemotherapy: Secondary | ICD-10-CM | POA: Diagnosis not present

## 2016-07-24 DIAGNOSIS — I6529 Occlusion and stenosis of unspecified carotid artery: Secondary | ICD-10-CM | POA: Diagnosis not present

## 2016-07-24 DIAGNOSIS — I10 Essential (primary) hypertension: Secondary | ICD-10-CM | POA: Diagnosis not present

## 2016-07-24 DIAGNOSIS — E119 Type 2 diabetes mellitus without complications: Secondary | ICD-10-CM | POA: Diagnosis not present

## 2016-07-24 DIAGNOSIS — Z90710 Acquired absence of both cervix and uterus: Secondary | ICD-10-CM | POA: Diagnosis not present

## 2016-07-24 DIAGNOSIS — C8250 Diffuse follicle center lymphoma, unspecified site: Secondary | ICD-10-CM | POA: Diagnosis not present

## 2016-07-24 DIAGNOSIS — K219 Gastro-esophageal reflux disease without esophagitis: Secondary | ICD-10-CM | POA: Diagnosis not present

## 2016-07-24 DIAGNOSIS — Z95828 Presence of other vascular implants and grafts: Secondary | ICD-10-CM | POA: Diagnosis not present

## 2016-07-24 DIAGNOSIS — F17210 Nicotine dependence, cigarettes, uncomplicated: Secondary | ICD-10-CM | POA: Diagnosis not present

## 2016-07-24 DIAGNOSIS — R197 Diarrhea, unspecified: Secondary | ICD-10-CM | POA: Diagnosis not present

## 2016-07-24 DIAGNOSIS — L298 Other pruritus: Secondary | ICD-10-CM | POA: Diagnosis not present

## 2016-07-24 DIAGNOSIS — K649 Unspecified hemorrhoids: Secondary | ICD-10-CM | POA: Diagnosis not present

## 2016-07-24 DIAGNOSIS — F419 Anxiety disorder, unspecified: Secondary | ICD-10-CM | POA: Diagnosis not present

## 2016-07-28 ENCOUNTER — Other Ambulatory Visit: Payer: Self-pay | Admitting: Physician Assistant

## 2016-07-29 DIAGNOSIS — C8100 Nodular lymphocyte predominant Hodgkin lymphoma, unspecified site: Secondary | ICD-10-CM | POA: Diagnosis not present

## 2016-07-29 DIAGNOSIS — Z5112 Encounter for antineoplastic immunotherapy: Secondary | ICD-10-CM | POA: Diagnosis not present

## 2016-07-31 ENCOUNTER — Telehealth: Payer: Self-pay | Admitting: Physician Assistant

## 2016-07-31 MED ORDER — HYDROCODONE-ACETAMINOPHEN 5-325 MG PO TABS: 0.5000 | ORAL_TABLET | Freq: Three times a day (TID) | ORAL | 0 refills | Status: DC

## 2016-07-31 NOTE — Telephone Encounter (Signed)
Pt husband Herbie Baltimore states the pt is having back pain and would like to know if there is anything you can call in.   Contact number [phone] or [phone]

## 2016-07-31 NOTE — Telephone Encounter (Signed)
Please advise 

## 2016-07-31 NOTE — Telephone Encounter (Signed)
Spoke with patient.  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  Will go ahead and help manage her pain with a narcotic and will refill if needed. [name], MS, PA-C 1:44 PM, 07/31/2016

## 2016-08-01 ENCOUNTER — Other Ambulatory Visit
Admission: RE | Admit: 2016-08-01 | Discharge: 2016-08-01 | Disposition: A | Discharge status: Home / Self Care | Payer: PPO | Source: Ambulatory Visit | Attending: Hematology and Oncology | Admitting: Hematology and Oncology

## 2016-08-01 DIAGNOSIS — C8580 Other specified types of non-Hodgkin lymphoma, unspecified site: Secondary | ICD-10-CM | POA: Diagnosis not present

## 2016-08-01 LAB — COMPREHENSIVE METABOLIC PANEL
ALBUMIN: 4.2 g/dL (ref 3.5–5.0)
ALT: 44 U/L (ref 14–54)
ANION GAP: 7 (ref 5–15)
AST: 32 U/L (ref 15–41)
Alkaline Phosphatase: 75 U/L (ref 38–126)
BUN: 17 mg/dL (ref 6–20)
CO2: 24 mmol/L (ref 22–32)
Calcium: 8.7 mg/dL — ABNORMAL LOW (ref 8.9–10.3)
Chloride: 102 mmol/L (ref 101–111)
Creatinine, Ser: 0.68 mg/dL (ref 0.44–1.00)
GFR calc Af Amer: >60 mL/min (ref >60)
GFR calc non Af Amer: >60 mL/min (ref >60)
GLUCOSE: 150 mg/dL — AB (ref 65–99)
POTASSIUM: 4 mmol/L (ref 3.5–5.1)
SODIUM: 133 mmol/L — AB (ref 135–145)
TOTAL PROTEIN: 7.2 g/dL (ref 6.5–8.1)
Total Bilirubin: 0.7 mg/dL (ref 0.3–1.2)

## 2016-08-01 LAB — CBC WITH DIFFERENTIAL/PLATELET
BASOS PCT: 2 %
Basophils Absolute: 0 10*3/uL (ref 0–0.1)
EOS ABS: 0.1 10*3/uL (ref 0–0.7)
Eosinophils Relative: 4 %
HCT: 32.8 % — ABNORMAL LOW (ref 35.0–47.0)
Hemoglobin: 11.2 g/dL — ABNORMAL LOW (ref 12.0–16.0)
Lymphocytes Relative: 47 %
Lymphs Abs: 1.2 10*3/uL (ref 1.0–3.6)
MCH: 30.7 pg (ref 26.0–34.0)
MCHC: 34.1 g/dL (ref 32.0–36.0)
MCV: 89.8 fL (ref 80.0–100.0)
MONO ABS: 0.2 10*3/uL (ref 0.2–0.9)
MONOS PCT: 6 %
NEUTROS PCT: 41 %
Neutro Abs: 1.1 10*3/uL — ABNORMAL LOW (ref 1.4–6.5)
Platelets: 113 10*3/uL — ABNORMAL LOW (ref 150–440)
RBC: 3.65 MIL/uL — ABNORMAL LOW (ref 3.80–5.20)
RDW: 13.4 % (ref 11.5–14.5)
WBC: 2.6 10*3/uL — ABNORMAL LOW (ref 3.6–11.0)

## 2016-08-01 LAB — MAGNESIUM: Magnesium: 1.8 mg/dL (ref 1.7–2.4)

## 2016-08-07 ENCOUNTER — Other Ambulatory Visit
Admission: RE | Admit: 2016-08-07 | Discharge: 2016-08-07 | Disposition: A | Discharge status: Home / Self Care | Payer: PPO | Source: Ambulatory Visit | Attending: Hematology and Oncology | Admitting: Hematology and Oncology

## 2016-08-07 DIAGNOSIS — C8580 Other specified types of non-Hodgkin lymphoma, unspecified site: Secondary | ICD-10-CM | POA: Insufficient documentation

## 2016-08-07 LAB — COMPREHENSIVE METABOLIC PANEL
ALK PHOS: 143 U/L — AB (ref 38–126)
ALT: 25 U/L (ref 14–54)
AST: 23 U/L (ref 15–41)
Albumin: 4 g/dL (ref 3.5–5.0)
Anion gap: 8 (ref 5–15)
BUN: 9 mg/dL (ref 6–20)
CALCIUM: 8.7 mg/dL — AB (ref 8.9–10.3)
CHLORIDE: 104 mmol/L (ref 101–111)
CO2: 27 mmol/L (ref 22–32)
CREATININE: 0.7 mg/dL (ref 0.44–1.00)
GFR calc Af Amer: >60 mL/min (ref >60)
Glucose, Bld: 131 mg/dL — ABNORMAL HIGH (ref 65–99)
Potassium: 3.6 mmol/L (ref 3.5–5.1)
Sodium: 139 mmol/L (ref 135–145)
Total Bilirubin: 0.3 mg/dL (ref 0.3–1.2)
Total Protein: 6.7 g/dL (ref 6.5–8.1)

## 2016-08-07 LAB — CBC WITH DIFFERENTIAL/PLATELET
BAND NEUTROPHILS: 4 %
BASOS ABS: 0 10*3/uL (ref 0–0.1)
BLASTS: 0 %
Basophils Relative: 0 %
EOS ABS: 0.1 10*3/uL (ref 0–0.7)
Eosinophils Relative: 1 %
HCT: 28.7 % — ABNORMAL LOW (ref 35.0–47.0)
HEMOGLOBIN: 9.8 g/dL — AB (ref 12.0–16.0)
Lymphocytes Relative: 26 %
Lymphs Abs: 3.6 10*3/uL (ref 1.0–3.6)
MCH: 30.8 pg (ref 26.0–34.0)
MCHC: 34.1 g/dL (ref 32.0–36.0)
MCV: 90.3 fL (ref 80.0–100.0)
METAMYELOCYTES PCT: 1 %
MONOS PCT: 6 %
MYELOCYTES: 0 %
Monocytes Absolute: 0.8 10*3/uL (ref 0.2–0.9)
NEUTROS ABS: 9.4 10*3/uL — AB (ref 1.4–6.5)
Neutrophils Relative %: 62 %
Other: 0 %
PLATELETS: 61 10*3/uL — AB (ref 150–440)
PROMYELOCYTES ABS: 0 %
RBC: 3.18 MIL/uL — AB (ref 3.80–5.20)
RDW: 13.9 % (ref 11.5–14.5)
WBC: 13.9 10*3/uL — AB (ref 3.6–11.0)
nRBC: 0 /100 WBC

## 2016-08-07 LAB — MAGNESIUM: MAGNESIUM: 1.8 mg/dL (ref 1.7–2.4)

## 2016-08-08 NOTE — Telephone Encounter (Signed)
Pt's spouse called to ask that blood results taken at [facility] are review and a result call is placed to patient. Thank you.   [phone] please call

## 2016-08-09 ENCOUNTER — Other Ambulatory Visit: Payer: Self-pay | Admitting: Physician Assistant

## 2016-08-09 DIAGNOSIS — D649 Anemia, unspecified: Secondary | ICD-10-CM | POA: Diagnosis not present

## 2016-08-09 DIAGNOSIS — T451X5A Adverse effect of antineoplastic and immunosuppressive drugs, initial encounter: Secondary | ICD-10-CM | POA: Diagnosis not present

## 2016-08-09 DIAGNOSIS — Z91040 Latex allergy status: Secondary | ICD-10-CM | POA: Diagnosis not present

## 2016-08-09 DIAGNOSIS — B9689 Other specified bacterial agents as the cause of diseases classified elsewhere: Secondary | ICD-10-CM | POA: Diagnosis not present

## 2016-08-09 DIAGNOSIS — K219 Gastro-esophageal reflux disease without esophagitis: Secondary | ICD-10-CM | POA: Diagnosis not present

## 2016-08-09 DIAGNOSIS — D696 Thrombocytopenia, unspecified: Secondary | ICD-10-CM | POA: Diagnosis not present

## 2016-08-09 DIAGNOSIS — I10 Essential (primary) hypertension: Secondary | ICD-10-CM | POA: Diagnosis not present

## 2016-08-09 DIAGNOSIS — Z7984 Long term (current) use of oral hypoglycemic drugs: Secondary | ICD-10-CM | POA: Diagnosis not present

## 2016-08-09 DIAGNOSIS — L03313 Cellulitis of chest wall: Secondary | ICD-10-CM | POA: Diagnosis not present

## 2016-08-09 DIAGNOSIS — E119 Type 2 diabetes mellitus without complications: Secondary | ICD-10-CM

## 2016-08-09 DIAGNOSIS — E118 Type 2 diabetes mellitus with unspecified complications: Secondary | ICD-10-CM | POA: Diagnosis not present

## 2016-08-09 DIAGNOSIS — C8590 Non-Hodgkin lymphoma, unspecified, unspecified site: Secondary | ICD-10-CM | POA: Diagnosis not present

## 2016-08-09 DIAGNOSIS — D6481 Anemia due to antineoplastic chemotherapy: Secondary | ICD-10-CM | POA: Diagnosis not present

## 2016-08-09 DIAGNOSIS — L03818 Cellulitis of other sites: Secondary | ICD-10-CM | POA: Diagnosis not present

## 2016-08-09 DIAGNOSIS — Z886 Allergy status to analgesic agent status: Secondary | ICD-10-CM | POA: Diagnosis not present

## 2016-08-09 DIAGNOSIS — C8519 Unspecified B-cell lymphoma, extranodal and solid organ sites: Secondary | ICD-10-CM | POA: Diagnosis not present

## 2016-08-09 DIAGNOSIS — F419 Anxiety disorder, unspecified: Secondary | ICD-10-CM | POA: Diagnosis not present

## 2016-08-09 DIAGNOSIS — Z452 Encounter for adjustment and management of vascular access device: Secondary | ICD-10-CM | POA: Diagnosis not present

## 2016-08-09 DIAGNOSIS — T80212A Local infection due to central venous catheter, initial encounter: Secondary | ICD-10-CM | POA: Diagnosis not present

## 2016-08-09 DIAGNOSIS — I6529 Occlusion and stenosis of unspecified carotid artery: Secondary | ICD-10-CM | POA: Diagnosis not present

## 2016-08-09 DIAGNOSIS — Y831 Surgical operation with implant of artificial internal device as the cause of abnormal reaction of the patient, or of later complication, without mention of misadventure at the time of the procedure: Secondary | ICD-10-CM | POA: Diagnosis not present

## 2016-08-09 DIAGNOSIS — Z794 Long term (current) use of insulin: Secondary | ICD-10-CM | POA: Diagnosis not present

## 2016-08-09 DIAGNOSIS — F17210 Nicotine dependence, cigarettes, uncomplicated: Secondary | ICD-10-CM | POA: Diagnosis not present

## 2016-08-09 DIAGNOSIS — F329 Major depressive disorder, single episode, unspecified: Secondary | ICD-10-CM | POA: Diagnosis not present

## 2016-08-09 DIAGNOSIS — Z7902 Long term (current) use of antithrombotics/antiplatelets: Secondary | ICD-10-CM | POA: Diagnosis not present

## 2016-08-10 DIAGNOSIS — D649 Anemia, unspecified: Secondary | ICD-10-CM | POA: Diagnosis not present

## 2016-08-10 DIAGNOSIS — C8590 Non-Hodgkin lymphoma, unspecified, unspecified site: Secondary | ICD-10-CM | POA: Diagnosis not present

## 2016-08-10 DIAGNOSIS — Z794 Long term (current) use of insulin: Secondary | ICD-10-CM | POA: Diagnosis not present

## 2016-08-10 DIAGNOSIS — D696 Thrombocytopenia, unspecified: Secondary | ICD-10-CM | POA: Diagnosis not present

## 2016-08-10 DIAGNOSIS — E118 Type 2 diabetes mellitus with unspecified complications: Secondary | ICD-10-CM | POA: Diagnosis not present

## 2016-08-10 DIAGNOSIS — L03818 Cellulitis of other sites: Secondary | ICD-10-CM | POA: Diagnosis not present

## 2016-08-11 DIAGNOSIS — C8590 Non-Hodgkin lymphoma, unspecified, unspecified site: Secondary | ICD-10-CM | POA: Diagnosis not present

## 2016-08-11 DIAGNOSIS — D696 Thrombocytopenia, unspecified: Secondary | ICD-10-CM | POA: Diagnosis not present

## 2016-08-11 DIAGNOSIS — D649 Anemia, unspecified: Secondary | ICD-10-CM | POA: Diagnosis not present

## 2016-08-11 DIAGNOSIS — Z794 Long term (current) use of insulin: Secondary | ICD-10-CM | POA: Diagnosis not present

## 2016-08-11 DIAGNOSIS — L03818 Cellulitis of other sites: Secondary | ICD-10-CM | POA: Diagnosis not present

## 2016-08-11 DIAGNOSIS — E118 Type 2 diabetes mellitus with unspecified complications: Secondary | ICD-10-CM | POA: Diagnosis not present

## 2016-08-11 NOTE — Telephone Encounter (Signed)
Results given at [facility]. Wife admitted to hospital over the weekend

## 2016-08-12 DIAGNOSIS — E118 Type 2 diabetes mellitus with unspecified complications: Secondary | ICD-10-CM | POA: Diagnosis not present

## 2016-08-12 DIAGNOSIS — L03818 Cellulitis of other sites: Secondary | ICD-10-CM | POA: Diagnosis not present

## 2016-08-12 DIAGNOSIS — C8590 Non-Hodgkin lymphoma, unspecified, unspecified site: Secondary | ICD-10-CM | POA: Diagnosis not present

## 2016-08-14 DIAGNOSIS — Z7902 Long term (current) use of antithrombotics/antiplatelets: Secondary | ICD-10-CM | POA: Diagnosis not present

## 2016-08-14 DIAGNOSIS — Z7984 Long term (current) use of oral hypoglycemic drugs: Secondary | ICD-10-CM | POA: Diagnosis not present

## 2016-08-14 DIAGNOSIS — C8519 Unspecified B-cell lymphoma, extranodal and solid organ sites: Secondary | ICD-10-CM | POA: Diagnosis not present

## 2016-08-14 DIAGNOSIS — F329 Major depressive disorder, single episode, unspecified: Secondary | ICD-10-CM | POA: Diagnosis not present

## 2016-08-14 DIAGNOSIS — I10 Essential (primary) hypertension: Secondary | ICD-10-CM | POA: Diagnosis not present

## 2016-08-14 DIAGNOSIS — M791 Myalgia: Secondary | ICD-10-CM | POA: Diagnosis not present

## 2016-08-14 DIAGNOSIS — K219 Gastro-esophageal reflux disease without esophagitis: Secondary | ICD-10-CM | POA: Diagnosis not present

## 2016-08-14 DIAGNOSIS — Z886 Allergy status to analgesic agent status: Secondary | ICD-10-CM | POA: Diagnosis not present

## 2016-08-14 DIAGNOSIS — I6529 Occlusion and stenosis of unspecified carotid artery: Secondary | ICD-10-CM | POA: Diagnosis not present

## 2016-08-14 DIAGNOSIS — Z91040 Latex allergy status: Secondary | ICD-10-CM | POA: Diagnosis not present

## 2016-08-14 DIAGNOSIS — E119 Type 2 diabetes mellitus without complications: Secondary | ICD-10-CM | POA: Diagnosis not present

## 2016-08-14 DIAGNOSIS — Z801 Family history of malignant neoplasm of trachea, bronchus and lung: Secondary | ICD-10-CM | POA: Diagnosis not present

## 2016-08-14 DIAGNOSIS — Z5111 Encounter for antineoplastic chemotherapy: Secondary | ICD-10-CM | POA: Diagnosis not present

## 2016-08-14 DIAGNOSIS — R531 Weakness: Secondary | ICD-10-CM | POA: Diagnosis not present

## 2016-08-14 DIAGNOSIS — F17210 Nicotine dependence, cigarettes, uncomplicated: Secondary | ICD-10-CM | POA: Diagnosis not present

## 2016-08-14 DIAGNOSIS — Z8051 Family history of malignant neoplasm of kidney: Secondary | ICD-10-CM | POA: Diagnosis not present

## 2016-08-14 DIAGNOSIS — C8338 Diffuse large B-cell lymphoma, lymph nodes of multiple sites: Secondary | ICD-10-CM | POA: Diagnosis not present

## 2016-08-14 DIAGNOSIS — F419 Anxiety disorder, unspecified: Secondary | ICD-10-CM | POA: Diagnosis not present

## 2016-08-18 DIAGNOSIS — C8100 Nodular lymphocyte predominant Hodgkin lymphoma, unspecified site: Secondary | ICD-10-CM | POA: Diagnosis not present

## 2016-08-18 DIAGNOSIS — Z5112 Encounter for antineoplastic immunotherapy: Secondary | ICD-10-CM | POA: Diagnosis not present

## 2016-08-19 ENCOUNTER — Other Ambulatory Visit
Admission: RE | Admit: 2016-08-19 | Discharge: 2016-08-19 | Disposition: A | Discharge status: Home / Self Care | Payer: PPO | Source: Ambulatory Visit | Attending: Hematology and Oncology | Admitting: Hematology and Oncology

## 2016-08-19 DIAGNOSIS — C8580 Other specified types of non-Hodgkin lymphoma, unspecified site: Secondary | ICD-10-CM | POA: Diagnosis not present

## 2016-08-19 LAB — CBC WITH DIFFERENTIAL/PLATELET
BASOS ABS: 0 10*3/uL (ref 0–0.1)
Basophils Relative: 0 %
EOS ABS: 0 10*3/uL (ref 0–0.7)
EOS PCT: 0 %
HCT: 29.8 % — ABNORMAL LOW (ref 35.0–47.0)
Hemoglobin: 10.1 g/dL — ABNORMAL LOW (ref 12.0–16.0)
LYMPHS ABS: 0.8 10*3/uL — AB (ref 1.0–3.6)
Lymphocytes Relative: 3 %
MCH: 30.3 pg (ref 26.0–34.0)
MCHC: 33.8 g/dL (ref 32.0–36.0)
MCV: 89.8 fL (ref 80.0–100.0)
Monocytes Absolute: 0.1 10*3/uL — ABNORMAL LOW (ref 0.2–0.9)
Monocytes Relative: 0 %
Neutro Abs: 29.5 10*3/uL — ABNORMAL HIGH (ref 1.4–6.5)
Neutrophils Relative %: 97 %
PLATELETS: 217 10*3/uL (ref 150–440)
RBC: 3.32 MIL/uL — AB (ref 3.80–5.20)
RDW: 14.3 % (ref 11.5–14.5)
WBC: 30.4 10*3/uL — AB (ref 3.6–11.0)

## 2016-08-19 LAB — COMPREHENSIVE METABOLIC PANEL
ALT: 24 U/L (ref 14–54)
ANION GAP: 10 (ref 5–15)
AST: 23 U/L (ref 15–41)
Albumin: 4.2 g/dL (ref 3.5–5.0)
Alkaline Phosphatase: 137 U/L — ABNORMAL HIGH (ref 38–126)
BUN: 26 mg/dL — ABNORMAL HIGH (ref 6–20)
CHLORIDE: 104 mmol/L (ref 101–111)
CO2: 19 mmol/L — AB (ref 22–32)
CREATININE: 1 mg/dL (ref 0.44–1.00)
Calcium: 9 mg/dL (ref 8.9–10.3)
GFR calc non Af Amer: 57 mL/min — ABNORMAL LOW (ref >60)
Glucose, Bld: 202 mg/dL — ABNORMAL HIGH (ref 65–99)
Potassium: 3.8 mmol/L (ref 3.5–5.1)
SODIUM: 133 mmol/L — AB (ref 135–145)
Total Bilirubin: 0.7 mg/dL (ref 0.3–1.2)
Total Protein: 7.3 g/dL (ref 6.5–8.1)

## 2016-08-25 ENCOUNTER — Other Ambulatory Visit: Payer: Self-pay | Admitting: Physician Assistant

## 2016-08-25 DIAGNOSIS — B009 Herpesviral infection, unspecified: Secondary | ICD-10-CM

## 2016-08-26 ENCOUNTER — Other Ambulatory Visit
Admission: RE | Admit: 2016-08-26 | Discharge: 2016-08-26 | Disposition: A | Discharge status: Home / Self Care | Payer: PPO | Source: Ambulatory Visit | Attending: Hematology and Oncology | Admitting: Hematology and Oncology

## 2016-08-26 DIAGNOSIS — C8580 Other specified types of non-Hodgkin lymphoma, unspecified site: Secondary | ICD-10-CM | POA: Diagnosis not present

## 2016-08-26 LAB — CBC WITH DIFFERENTIAL/PLATELET
BAND NEUTROPHILS: 2 %
BASOS ABS: 0.2 10*3/uL — AB (ref 0–0.1)
BASOS PCT: 1 %
BLASTS: 0 %
EOS ABS: 0 10*3/uL (ref 0–0.7)
Eosinophils Relative: 0 %
HEMATOCRIT: 22.7 % — AB (ref 35.0–47.0)
HEMOGLOBIN: 7.7 g/dL — AB (ref 12.0–16.0)
LYMPHS PCT: 23 %
Lymphs Abs: 3.6 10*3/uL (ref 1.0–3.6)
MCH: 30.5 pg (ref 26.0–34.0)
MCHC: 34 g/dL (ref 32.0–36.0)
MCV: 89.7 fL (ref 80.0–100.0)
MONO ABS: 0.5 10*3/uL (ref 0.2–0.9)
MYELOCYTES: 2 %
Metamyelocytes Relative: 2 %
Monocytes Relative: 3 %
Neutro Abs: 11.2 10*3/uL — ABNORMAL HIGH (ref 1.4–6.5)
Neutrophils Relative %: 67 %
OTHER: 0 %
PROMYELOCYTES ABS: 0 %
Platelets: 41 10*3/uL — ABNORMAL LOW (ref 150–440)
RBC: 2.53 MIL/uL — ABNORMAL LOW (ref 3.80–5.20)
RDW: 14.1 % (ref 11.5–14.5)
WBC: 15.5 10*3/uL — ABNORMAL HIGH (ref 3.6–11.0)
nRBC: 0 /100 WBC

## 2016-08-26 LAB — COMPREHENSIVE METABOLIC PANEL
ALBUMIN: 4 g/dL (ref 3.5–5.0)
ALK PHOS: 167 U/L — AB (ref 38–126)
ALT: 23 U/L (ref 14–54)
ANION GAP: 8 (ref 5–15)
AST: 25 U/L (ref 15–41)
BILIRUBIN TOTAL: 0.3 mg/dL (ref 0.3–1.2)
BUN: 8 mg/dL (ref 6–20)
CALCIUM: 8.5 mg/dL — AB (ref 8.9–10.3)
CO2: 23 mmol/L (ref 22–32)
Chloride: 110 mmol/L (ref 101–111)
Creatinine, Ser: 0.67 mg/dL (ref 0.44–1.00)
GFR calc Af Amer: >60 mL/min (ref >60)
GFR calc non Af Amer: >60 mL/min (ref >60)
GLUCOSE: 145 mg/dL — AB (ref 65–99)
Potassium: 3.5 mmol/L (ref 3.5–5.1)
SODIUM: 141 mmol/L (ref 135–145)
TOTAL PROTEIN: 6.3 g/dL — AB (ref 6.5–8.1)

## 2016-08-26 LAB — MAGNESIUM: Magnesium: 1.5 mg/dL — ABNORMAL LOW (ref 1.7–2.4)

## 2016-08-27 ENCOUNTER — Telehealth: Payer: Self-pay | Admitting: Physician Assistant

## 2016-08-27 NOTE — Telephone Encounter (Signed)
Pt is asking for a refill on valtrex she is having a flare up   Best number [phone]

## 2016-08-28 DIAGNOSIS — C8330 Diffuse large B-cell lymphoma, unspecified site: Secondary | ICD-10-CM | POA: Diagnosis not present

## 2016-08-28 NOTE — Telephone Encounter (Signed)
Refill  Has been sent to pharmacy.

## 2016-09-02 ENCOUNTER — Telehealth: Payer: Self-pay

## 2016-09-02 DIAGNOSIS — I10 Essential (primary) hypertension: Secondary | ICD-10-CM | POA: Diagnosis not present

## 2016-09-02 DIAGNOSIS — Z1159 Encounter for screening for other viral diseases: Secondary | ICD-10-CM | POA: Diagnosis not present

## 2016-09-02 DIAGNOSIS — I340 Nonrheumatic mitral (valve) insufficiency: Secondary | ICD-10-CM | POA: Diagnosis not present

## 2016-09-02 DIAGNOSIS — Z52011 Autologous donor, stem cells: Secondary | ICD-10-CM | POA: Diagnosis not present

## 2016-09-02 DIAGNOSIS — C8338 Diffuse large B-cell lymphoma, lymph nodes of multiple sites: Secondary | ICD-10-CM | POA: Diagnosis not present

## 2016-09-02 DIAGNOSIS — Z79899 Other long term (current) drug therapy: Secondary | ICD-10-CM | POA: Diagnosis not present

## 2016-09-02 DIAGNOSIS — Z01818 Encounter for other preprocedural examination: Secondary | ICD-10-CM | POA: Diagnosis not present

## 2016-09-02 DIAGNOSIS — I059 Rheumatic mitral valve disease, unspecified: Secondary | ICD-10-CM | POA: Diagnosis not present

## 2016-09-02 DIAGNOSIS — R0689 Other abnormalities of breathing: Secondary | ICD-10-CM | POA: Diagnosis not present

## 2016-09-02 DIAGNOSIS — Z1379 Encounter for other screening for genetic and chromosomal anomalies: Secondary | ICD-10-CM | POA: Diagnosis not present

## 2016-09-02 DIAGNOSIS — F3341 Major depressive disorder, recurrent, in partial remission: Secondary | ICD-10-CM | POA: Diagnosis not present

## 2016-09-02 NOTE — Telephone Encounter (Signed)
Called pt to schedule Medicare Annual Wellness Visit with Nurse Health Advisor. -nr

## 2016-09-04 DIAGNOSIS — Z949 Transplanted organ and tissue status, unspecified: Secondary | ICD-10-CM | POA: Diagnosis not present

## 2016-09-04 DIAGNOSIS — C8338 Diffuse large B-cell lymphoma, lymph nodes of multiple sites: Secondary | ICD-10-CM | POA: Diagnosis not present

## 2016-09-11 DIAGNOSIS — Z9689 Presence of other specified functional implants: Secondary | ICD-10-CM | POA: Diagnosis not present

## 2016-09-11 DIAGNOSIS — Z52011 Autologous donor, stem cells: Secondary | ICD-10-CM | POA: Diagnosis not present

## 2016-09-11 DIAGNOSIS — C8338 Diffuse large B-cell lymphoma, lymph nodes of multiple sites: Secondary | ICD-10-CM | POA: Diagnosis not present

## 2016-09-18 ENCOUNTER — Other Ambulatory Visit: Payer: Self-pay | Admitting: Physician Assistant

## 2016-09-18 DIAGNOSIS — E119 Type 2 diabetes mellitus without complications: Secondary | ICD-10-CM

## 2016-09-29 ENCOUNTER — Other Ambulatory Visit: Payer: Self-pay | Admitting: Physician Assistant

## 2016-09-29 DIAGNOSIS — I10 Essential (primary) hypertension: Secondary | ICD-10-CM

## 2016-10-02 ENCOUNTER — Telehealth: Payer: Self-pay | Admitting: Physician Assistant

## 2016-10-02 DIAGNOSIS — I10 Essential (primary) hypertension: Secondary | ICD-10-CM

## 2016-10-02 DIAGNOSIS — C8338 Diffuse large B-cell lymphoma, lymph nodes of multiple sites: Secondary | ICD-10-CM | POA: Diagnosis not present

## 2016-10-02 NOTE — Telephone Encounter (Signed)
Patients husband called in to request that the Altace his wife has been taking be refilled as the couple is undergoing stem-cell treatment and will be at a hospital out of town for the next 5 weeks and will be unable to come to an appointment to renew the prescription.   Best Number: [phone]  Please Advise

## 2016-10-03 DIAGNOSIS — C8338 Diffuse large B-cell lymphoma, lymph nodes of multiple sites: Secondary | ICD-10-CM | POA: Diagnosis not present

## 2016-10-04 DIAGNOSIS — C8338 Diffuse large B-cell lymphoma, lymph nodes of multiple sites: Secondary | ICD-10-CM | POA: Diagnosis not present

## 2016-10-05 DIAGNOSIS — C8338 Diffuse large B-cell lymphoma, lymph nodes of multiple sites: Secondary | ICD-10-CM | POA: Diagnosis not present

## 2016-10-06 DIAGNOSIS — Z1159 Encounter for screening for other viral diseases: Secondary | ICD-10-CM | POA: Diagnosis not present

## 2016-10-06 DIAGNOSIS — C8338 Diffuse large B-cell lymphoma, lymph nodes of multiple sites: Secondary | ICD-10-CM | POA: Diagnosis not present

## 2016-10-06 MED ORDER — RAMIPRIL 5 MG PO CAPS: 5.0000 mg | ORAL_CAPSULE | Freq: Every day | ORAL | 3 refills | Status: DC

## 2016-10-06 NOTE — Telephone Encounter (Signed)
Is it ok to send this in patient hasn't been seen since 06-2015?

## 2016-10-06 NOTE — Telephone Encounter (Signed)
Have spoken to patient and her husband, made them aware of medication

## 2016-10-06 NOTE — Telephone Encounter (Signed)
I have sent to the pharmacy.Will plan to see her back after her cancer treatment schedule calms down some.  [name], MS, PA-C 4:18 PM, 10/06/2016

## 2016-10-06 NOTE — Telephone Encounter (Signed)
Please call her. [name], MS, PA-C 4:19 PM, 10/06/2016

## 2016-10-07 DIAGNOSIS — C8200 Follicular lymphoma grade I, unspecified site: Secondary | ICD-10-CM | POA: Diagnosis not present

## 2016-10-07 DIAGNOSIS — C8338 Diffuse large B-cell lymphoma, lymph nodes of multiple sites: Secondary | ICD-10-CM | POA: Diagnosis not present

## 2016-10-08 ENCOUNTER — Other Ambulatory Visit: Payer: Self-pay | Admitting: Physician Assistant

## 2016-10-08 DIAGNOSIS — I10 Essential (primary) hypertension: Secondary | ICD-10-CM

## 2016-10-08 DIAGNOSIS — Z760 Encounter for issue of repeat prescription: Secondary | ICD-10-CM

## 2016-10-10 ENCOUNTER — Telehealth: Payer: Self-pay | Admitting: Physician Assistant

## 2016-10-10 NOTE — Telephone Encounter (Signed)
PT HUSBAND CALLED STATED THAT THE PHARMACY STATED THAT HAVE NOT RECEIVED AN ORDER FOR HER MEDS.Marland Kitchen  PLEASE ADVISE

## 2016-10-14 DIAGNOSIS — C8100 Nodular lymphocyte predominant Hodgkin lymphoma, unspecified site: Secondary | ICD-10-CM | POA: Diagnosis not present

## 2016-10-15 DIAGNOSIS — C8100 Nodular lymphocyte predominant Hodgkin lymphoma, unspecified site: Secondary | ICD-10-CM | POA: Diagnosis not present

## 2016-10-15 DIAGNOSIS — F17200 Nicotine dependence, unspecified, uncomplicated: Secondary | ICD-10-CM | POA: Diagnosis not present

## 2016-10-15 DIAGNOSIS — G620 Drug-induced polyneuropathy: Secondary | ICD-10-CM | POA: Diagnosis not present

## 2016-10-15 DIAGNOSIS — Z9484 Stem cells transplant status: Secondary | ICD-10-CM | POA: Diagnosis not present

## 2016-10-15 DIAGNOSIS — E119 Type 2 diabetes mellitus without complications: Secondary | ICD-10-CM | POA: Diagnosis not present

## 2016-10-15 DIAGNOSIS — F411 Generalized anxiety disorder: Secondary | ICD-10-CM | POA: Diagnosis not present

## 2016-10-15 DIAGNOSIS — J3489 Other specified disorders of nose and nasal sinuses: Secondary | ICD-10-CM | POA: Diagnosis not present

## 2016-10-15 DIAGNOSIS — Z7984 Long term (current) use of oral hypoglycemic drugs: Secondary | ICD-10-CM | POA: Diagnosis not present

## 2016-10-15 DIAGNOSIS — Z52011 Autologous donor, stem cells: Secondary | ICD-10-CM | POA: Diagnosis not present

## 2016-10-15 DIAGNOSIS — D61810 Antineoplastic chemotherapy induced pancytopenia: Secondary | ICD-10-CM | POA: Diagnosis not present

## 2016-10-15 DIAGNOSIS — Z79899 Other long term (current) drug therapy: Secondary | ICD-10-CM | POA: Diagnosis not present

## 2016-10-15 DIAGNOSIS — I10 Essential (primary) hypertension: Secondary | ICD-10-CM | POA: Diagnosis not present

## 2016-10-15 DIAGNOSIS — C8330 Diffuse large B-cell lymphoma, unspecified site: Secondary | ICD-10-CM | POA: Diagnosis not present

## 2016-10-16 DIAGNOSIS — D61810 Antineoplastic chemotherapy induced pancytopenia: Secondary | ICD-10-CM | POA: Diagnosis not present

## 2016-10-16 DIAGNOSIS — F17200 Nicotine dependence, unspecified, uncomplicated: Secondary | ICD-10-CM | POA: Diagnosis not present

## 2016-10-16 DIAGNOSIS — Z9481 Bone marrow transplant status: Secondary | ICD-10-CM | POA: Diagnosis not present

## 2016-10-16 DIAGNOSIS — I10 Essential (primary) hypertension: Secondary | ICD-10-CM | POA: Diagnosis not present

## 2016-10-16 DIAGNOSIS — C8100 Nodular lymphocyte predominant Hodgkin lymphoma, unspecified site: Secondary | ICD-10-CM | POA: Diagnosis not present

## 2016-10-16 DIAGNOSIS — E119 Type 2 diabetes mellitus without complications: Secondary | ICD-10-CM | POA: Diagnosis not present

## 2016-10-16 DIAGNOSIS — C8290 Follicular lymphoma, unspecified, unspecified site: Secondary | ICD-10-CM | POA: Diagnosis not present

## 2016-10-16 DIAGNOSIS — Z79899 Other long term (current) drug therapy: Secondary | ICD-10-CM | POA: Diagnosis not present

## 2016-10-16 DIAGNOSIS — Z7984 Long term (current) use of oral hypoglycemic drugs: Secondary | ICD-10-CM | POA: Diagnosis not present

## 2016-10-16 DIAGNOSIS — Z9221 Personal history of antineoplastic chemotherapy: Secondary | ICD-10-CM | POA: Diagnosis not present

## 2016-10-16 DIAGNOSIS — G620 Drug-induced polyneuropathy: Secondary | ICD-10-CM | POA: Diagnosis not present

## 2016-10-16 DIAGNOSIS — Z9484 Stem cells transplant status: Secondary | ICD-10-CM | POA: Diagnosis not present

## 2016-10-16 DIAGNOSIS — F411 Generalized anxiety disorder: Secondary | ICD-10-CM | POA: Diagnosis not present

## 2016-10-16 DIAGNOSIS — C8330 Diffuse large B-cell lymphoma, unspecified site: Secondary | ICD-10-CM | POA: Diagnosis not present

## 2016-10-17 DIAGNOSIS — Z79899 Other long term (current) drug therapy: Secondary | ICD-10-CM | POA: Diagnosis not present

## 2016-10-17 DIAGNOSIS — E119 Type 2 diabetes mellitus without complications: Secondary | ICD-10-CM | POA: Diagnosis not present

## 2016-10-17 DIAGNOSIS — Z9221 Personal history of antineoplastic chemotherapy: Secondary | ICD-10-CM | POA: Diagnosis not present

## 2016-10-17 DIAGNOSIS — D61810 Antineoplastic chemotherapy induced pancytopenia: Secondary | ICD-10-CM | POA: Diagnosis not present

## 2016-10-17 DIAGNOSIS — C8100 Nodular lymphocyte predominant Hodgkin lymphoma, unspecified site: Secondary | ICD-10-CM | POA: Diagnosis not present

## 2016-10-17 DIAGNOSIS — F17200 Nicotine dependence, unspecified, uncomplicated: Secondary | ICD-10-CM | POA: Diagnosis not present

## 2016-10-17 DIAGNOSIS — F411 Generalized anxiety disorder: Secondary | ICD-10-CM | POA: Diagnosis not present

## 2016-10-17 DIAGNOSIS — Z7984 Long term (current) use of oral hypoglycemic drugs: Secondary | ICD-10-CM | POA: Diagnosis not present

## 2016-10-17 DIAGNOSIS — G620 Drug-induced polyneuropathy: Secondary | ICD-10-CM | POA: Diagnosis not present

## 2016-10-17 DIAGNOSIS — Z9484 Stem cells transplant status: Secondary | ICD-10-CM | POA: Diagnosis not present

## 2016-10-17 DIAGNOSIS — C8330 Diffuse large B-cell lymphoma, unspecified site: Secondary | ICD-10-CM | POA: Diagnosis not present

## 2016-10-17 DIAGNOSIS — I10 Essential (primary) hypertension: Secondary | ICD-10-CM | POA: Diagnosis not present

## 2016-10-18 DIAGNOSIS — F17200 Nicotine dependence, unspecified, uncomplicated: Secondary | ICD-10-CM | POA: Diagnosis not present

## 2016-10-18 DIAGNOSIS — Z79899 Other long term (current) drug therapy: Secondary | ICD-10-CM | POA: Diagnosis not present

## 2016-10-18 DIAGNOSIS — G620 Drug-induced polyneuropathy: Secondary | ICD-10-CM | POA: Diagnosis not present

## 2016-10-18 DIAGNOSIS — F411 Generalized anxiety disorder: Secondary | ICD-10-CM | POA: Diagnosis not present

## 2016-10-18 DIAGNOSIS — C8330 Diffuse large B-cell lymphoma, unspecified site: Secondary | ICD-10-CM | POA: Diagnosis not present

## 2016-10-18 DIAGNOSIS — Z9221 Personal history of antineoplastic chemotherapy: Secondary | ICD-10-CM | POA: Diagnosis not present

## 2016-10-18 DIAGNOSIS — Z7984 Long term (current) use of oral hypoglycemic drugs: Secondary | ICD-10-CM | POA: Diagnosis not present

## 2016-10-18 DIAGNOSIS — D61810 Antineoplastic chemotherapy induced pancytopenia: Secondary | ICD-10-CM | POA: Diagnosis not present

## 2016-10-18 DIAGNOSIS — I10 Essential (primary) hypertension: Secondary | ICD-10-CM | POA: Diagnosis not present

## 2016-10-18 DIAGNOSIS — Z9484 Stem cells transplant status: Secondary | ICD-10-CM | POA: Diagnosis not present

## 2016-10-18 DIAGNOSIS — E119 Type 2 diabetes mellitus without complications: Secondary | ICD-10-CM | POA: Diagnosis not present

## 2016-10-19 DIAGNOSIS — C8100 Nodular lymphocyte predominant Hodgkin lymphoma, unspecified site: Secondary | ICD-10-CM | POA: Diagnosis not present

## 2016-10-19 DIAGNOSIS — Z5111 Encounter for antineoplastic chemotherapy: Secondary | ICD-10-CM | POA: Diagnosis not present

## 2016-10-20 DIAGNOSIS — C8100 Nodular lymphocyte predominant Hodgkin lymphoma, unspecified site: Secondary | ICD-10-CM | POA: Diagnosis not present

## 2016-10-20 DIAGNOSIS — D61810 Antineoplastic chemotherapy induced pancytopenia: Secondary | ICD-10-CM | POA: Diagnosis not present

## 2016-10-20 DIAGNOSIS — Z7984 Long term (current) use of oral hypoglycemic drugs: Secondary | ICD-10-CM | POA: Diagnosis not present

## 2016-10-20 DIAGNOSIS — D61811 Other drug-induced pancytopenia: Secondary | ICD-10-CM | POA: Diagnosis not present

## 2016-10-20 DIAGNOSIS — C8330 Diffuse large B-cell lymphoma, unspecified site: Secondary | ICD-10-CM | POA: Diagnosis not present

## 2016-10-20 DIAGNOSIS — Z9484 Stem cells transplant status: Secondary | ICD-10-CM | POA: Diagnosis not present

## 2016-10-20 DIAGNOSIS — F17200 Nicotine dependence, unspecified, uncomplicated: Secondary | ICD-10-CM | POA: Diagnosis not present

## 2016-10-20 DIAGNOSIS — R112 Nausea with vomiting, unspecified: Secondary | ICD-10-CM | POA: Diagnosis not present

## 2016-10-20 DIAGNOSIS — Z9221 Personal history of antineoplastic chemotherapy: Secondary | ICD-10-CM | POA: Diagnosis not present

## 2016-10-20 DIAGNOSIS — F411 Generalized anxiety disorder: Secondary | ICD-10-CM | POA: Diagnosis not present

## 2016-10-20 DIAGNOSIS — I10 Essential (primary) hypertension: Secondary | ICD-10-CM | POA: Diagnosis not present

## 2016-10-20 DIAGNOSIS — G620 Drug-induced polyneuropathy: Secondary | ICD-10-CM | POA: Diagnosis not present

## 2016-10-20 DIAGNOSIS — E119 Type 2 diabetes mellitus without complications: Secondary | ICD-10-CM | POA: Diagnosis not present

## 2016-10-20 DIAGNOSIS — Z79899 Other long term (current) drug therapy: Secondary | ICD-10-CM | POA: Diagnosis not present

## 2016-10-21 DIAGNOSIS — Z9484 Stem cells transplant status: Secondary | ICD-10-CM | POA: Diagnosis not present

## 2016-10-21 DIAGNOSIS — T451X5A Adverse effect of antineoplastic and immunosuppressive drugs, initial encounter: Secondary | ICD-10-CM | POA: Diagnosis not present

## 2016-10-21 DIAGNOSIS — C8338 Diffuse large B-cell lymphoma, lymph nodes of multiple sites: Secondary | ICD-10-CM | POA: Diagnosis not present

## 2016-10-21 DIAGNOSIS — Z886 Allergy status to analgesic agent status: Secondary | ICD-10-CM | POA: Diagnosis not present

## 2016-10-21 DIAGNOSIS — D61810 Antineoplastic chemotherapy induced pancytopenia: Secondary | ICD-10-CM | POA: Diagnosis not present

## 2016-10-21 DIAGNOSIS — C8290 Follicular lymphoma, unspecified, unspecified site: Secondary | ICD-10-CM | POA: Diagnosis not present

## 2016-10-21 DIAGNOSIS — Z91040 Latex allergy status: Secondary | ICD-10-CM | POA: Diagnosis not present

## 2016-10-21 DIAGNOSIS — C8100 Nodular lymphocyte predominant Hodgkin lymphoma, unspecified site: Secondary | ICD-10-CM | POA: Diagnosis not present

## 2016-10-22 DIAGNOSIS — C8100 Nodular lymphocyte predominant Hodgkin lymphoma, unspecified site: Secondary | ICD-10-CM | POA: Diagnosis not present

## 2016-10-22 DIAGNOSIS — R5383 Other fatigue: Secondary | ICD-10-CM | POA: Diagnosis not present

## 2016-10-22 DIAGNOSIS — Z452 Encounter for adjustment and management of vascular access device: Secondary | ICD-10-CM | POA: Diagnosis not present

## 2016-10-23 DIAGNOSIS — Z9221 Personal history of antineoplastic chemotherapy: Secondary | ICD-10-CM | POA: Diagnosis not present

## 2016-10-23 DIAGNOSIS — Z52011 Autologous donor, stem cells: Secondary | ICD-10-CM | POA: Diagnosis not present

## 2016-10-23 DIAGNOSIS — D61810 Antineoplastic chemotherapy induced pancytopenia: Secondary | ICD-10-CM | POA: Diagnosis not present

## 2016-10-23 DIAGNOSIS — Z9889 Other specified postprocedural states: Secondary | ICD-10-CM | POA: Diagnosis not present

## 2016-10-23 DIAGNOSIS — C8330 Diffuse large B-cell lymphoma, unspecified site: Secondary | ICD-10-CM | POA: Diagnosis not present

## 2016-10-23 DIAGNOSIS — Z886 Allergy status to analgesic agent status: Secondary | ICD-10-CM | POA: Diagnosis not present

## 2016-10-23 DIAGNOSIS — D61818 Other pancytopenia: Secondary | ICD-10-CM | POA: Diagnosis not present

## 2016-10-23 DIAGNOSIS — R110 Nausea: Secondary | ICD-10-CM | POA: Diagnosis not present

## 2016-10-23 DIAGNOSIS — C8339 Diffuse large B-cell lymphoma, extranodal and solid organ sites: Secondary | ICD-10-CM | POA: Diagnosis not present

## 2016-10-23 DIAGNOSIS — C8100 Nodular lymphocyte predominant Hodgkin lymphoma, unspecified site: Secondary | ICD-10-CM | POA: Diagnosis not present

## 2016-10-24 DIAGNOSIS — Z886 Allergy status to analgesic agent status: Secondary | ICD-10-CM | POA: Diagnosis not present

## 2016-10-24 DIAGNOSIS — R5081 Fever presenting with conditions classified elsewhere: Secondary | ICD-10-CM | POA: Diagnosis not present

## 2016-10-24 DIAGNOSIS — K580 Irritable bowel syndrome with diarrhea: Secondary | ICD-10-CM | POA: Diagnosis not present

## 2016-10-24 DIAGNOSIS — K521 Toxic gastroenteritis and colitis: Secondary | ICD-10-CM | POA: Diagnosis not present

## 2016-10-24 DIAGNOSIS — D61810 Antineoplastic chemotherapy induced pancytopenia: Secondary | ICD-10-CM | POA: Diagnosis not present

## 2016-10-24 DIAGNOSIS — Z7984 Long term (current) use of oral hypoglycemic drugs: Secondary | ICD-10-CM | POA: Diagnosis not present

## 2016-10-24 DIAGNOSIS — Z801 Family history of malignant neoplasm of trachea, bronchus and lung: Secondary | ICD-10-CM | POA: Diagnosis not present

## 2016-10-24 DIAGNOSIS — C8339 Diffuse large B-cell lymphoma, extranodal and solid organ sites: Secondary | ICD-10-CM | POA: Diagnosis not present

## 2016-10-24 DIAGNOSIS — D649 Anemia, unspecified: Secondary | ICD-10-CM | POA: Diagnosis not present

## 2016-10-24 DIAGNOSIS — Z9484 Stem cells transplant status: Secondary | ICD-10-CM | POA: Diagnosis not present

## 2016-10-24 DIAGNOSIS — D61818 Other pancytopenia: Secondary | ICD-10-CM | POA: Diagnosis not present

## 2016-10-24 DIAGNOSIS — E119 Type 2 diabetes mellitus without complications: Secondary | ICD-10-CM | POA: Diagnosis not present

## 2016-10-24 DIAGNOSIS — F329 Major depressive disorder, single episode, unspecified: Secondary | ICD-10-CM | POA: Diagnosis not present

## 2016-10-24 DIAGNOSIS — T451X5A Adverse effect of antineoplastic and immunosuppressive drugs, initial encounter: Secondary | ICD-10-CM | POA: Diagnosis not present

## 2016-10-24 DIAGNOSIS — K529 Noninfective gastroenteritis and colitis, unspecified: Secondary | ICD-10-CM | POA: Diagnosis not present

## 2016-10-24 DIAGNOSIS — R109 Unspecified abdominal pain: Secondary | ICD-10-CM | POA: Diagnosis not present

## 2016-10-24 DIAGNOSIS — Z91040 Latex allergy status: Secondary | ICD-10-CM | POA: Diagnosis not present

## 2016-10-24 DIAGNOSIS — Z87891 Personal history of nicotine dependence: Secondary | ICD-10-CM | POA: Diagnosis not present

## 2016-10-24 DIAGNOSIS — E878 Other disorders of electrolyte and fluid balance, not elsewhere classified: Secondary | ICD-10-CM | POA: Diagnosis not present

## 2016-10-24 DIAGNOSIS — R509 Fever, unspecified: Secondary | ICD-10-CM | POA: Diagnosis not present

## 2016-10-24 DIAGNOSIS — I10 Essential (primary) hypertension: Secondary | ICD-10-CM | POA: Diagnosis not present

## 2016-10-24 DIAGNOSIS — R110 Nausea: Secondary | ICD-10-CM | POA: Diagnosis not present

## 2016-10-24 DIAGNOSIS — D709 Neutropenia, unspecified: Secondary | ICD-10-CM | POA: Diagnosis not present

## 2016-10-24 DIAGNOSIS — Z8051 Family history of malignant neoplasm of kidney: Secondary | ICD-10-CM | POA: Diagnosis not present

## 2016-10-24 DIAGNOSIS — E440 Moderate protein-calorie malnutrition: Secondary | ICD-10-CM | POA: Diagnosis not present

## 2016-10-24 DIAGNOSIS — C8338 Diffuse large B-cell lymphoma, lymph nodes of multiple sites: Secondary | ICD-10-CM | POA: Diagnosis not present

## 2016-10-24 DIAGNOSIS — R112 Nausea with vomiting, unspecified: Secondary | ICD-10-CM | POA: Diagnosis not present

## 2016-10-24 DIAGNOSIS — C8290 Follicular lymphoma, unspecified, unspecified site: Secondary | ICD-10-CM | POA: Diagnosis not present

## 2016-10-24 DIAGNOSIS — C8100 Nodular lymphocyte predominant Hodgkin lymphoma, unspecified site: Secondary | ICD-10-CM | POA: Diagnosis not present

## 2016-10-25 ENCOUNTER — Other Ambulatory Visit: Payer: Self-pay | Admitting: Physician Assistant

## 2016-10-25 DIAGNOSIS — Z760 Encounter for issue of repeat prescription: Secondary | ICD-10-CM

## 2016-10-26 DIAGNOSIS — C8100 Nodular lymphocyte predominant Hodgkin lymphoma, unspecified site: Secondary | ICD-10-CM | POA: Diagnosis not present

## 2016-10-26 DIAGNOSIS — C8338 Diffuse large B-cell lymphoma, lymph nodes of multiple sites: Secondary | ICD-10-CM | POA: Diagnosis not present

## 2016-10-27 DIAGNOSIS — Z9484 Stem cells transplant status: Secondary | ICD-10-CM | POA: Insufficient documentation

## 2016-10-27 DIAGNOSIS — D709 Neutropenia, unspecified: Secondary | ICD-10-CM | POA: Diagnosis not present

## 2016-10-27 DIAGNOSIS — R509 Fever, unspecified: Secondary | ICD-10-CM | POA: Diagnosis not present

## 2016-11-04 ENCOUNTER — Encounter: Payer: Self-pay | Admitting: *Deleted

## 2016-11-04 DIAGNOSIS — Z9484 Stem cells transplant status: Secondary | ICD-10-CM | POA: Diagnosis not present

## 2016-11-04 DIAGNOSIS — C8290 Follicular lymphoma, unspecified, unspecified site: Secondary | ICD-10-CM | POA: Diagnosis not present

## 2016-11-04 NOTE — Patient Outreach (Signed)
[place] [facility]) Care Management  11/04/2016  Beth Stanley [birth year] [id number]  Transition of Care Referral  Referral Date:   Referral Source: Date of Discharge: Facility: Discharge Diagnosis: Insurance:  Outreach attempt #  Social:  Conditions:  Medications:  Appointments:  Consent:   Plan: RNCM will  [name], RN, BSN, MHA/MSL, [place] Direct Phone: [phone] Toll Free: [phone] Fax: [phone]

## 2016-11-05 ENCOUNTER — Other Ambulatory Visit: Payer: Self-pay | Admitting: *Deleted

## 2016-11-05 ENCOUNTER — Telehealth: Payer: Self-pay | Admitting: Physician Assistant

## 2016-11-05 NOTE — Telephone Encounter (Signed)
This encounter was created in error - please disregard.

## 2016-11-05 NOTE — Telephone Encounter (Signed)
Pt is calling and needs to talk with michael clark about changing the metformin to something else she believes that is what is causing her an upset stomach  Best number [phone]

## 2016-11-05 NOTE — Patient Outreach (Signed)
[place] [facility]) Care Management  11/04/16  Beth Stanley July 19, 1950 [id number]   Transition of Care Referral  Referral Date: 11/04/16 Referral Source: HTA Date of Discharge: 1021/18 Facility: [facility] Discharge Diagnosis: Stem Cell Transplant Insurance: HTA  Patient has PMH of: Lymphoma, DM, HLD, HTN, Depression, GERD, and Stem Cell Transplant. Patient stated, she was in the hospital for 21 days due to having the stem cell transplant. Per patient, her immune system was "wiped clean". She has to be immunized with every childhood vaccine in the future. She is having difficulty with purchasing her medication, transportation, and difficulty managing her medical care. She reported symptoms of weakness and not quite being back to baseline. She asked about a Garment/textile technologist. Patient reported, she can't be out in the public due to her immune system. She verbalized having to wear a mask everywhere she goes. She has to have a special kind of mask, which she can't afford. She's having to travel to [facility] twice a week for a 100 days. Per patient, Chemotherapy destroyed the lining of her stomach. Her mouth is constantly dry, which may be related to her stomach. Patient's anxiety has increased since being discharged from the hospital. She hasn't arranged a hospital discharge appointment with her primary MD. RN CM encouraged patient to arrange an appointment, as soon as possible. She verbalized, having a history of DM and she doesn't monitor her blood glucose, per MD orders. She discussed her glucometer broke when she dropped it on the floor. Patient stated, she had a fall within the last 3 months and developed a hematoma in her breast. [facility] services benefits explained to patient and she agreed to services.   Plan: RN CM advised patient to contact RNCM for any needs or concerns. RN CM will send referral to [facility] RN for further in home  eval/assessment of care needs and management of chronic conditions. RN CM will send referral to [place] for transition of care. RN CM advised patient that [facility] community RN CM would follow up and contact patient within the next 10 days. RN CM will send [facility] pharmacy referral for assistance with affording medications.   Beth Bells, RN, BSN, MHA/MSL, [place] Telephonic Care Manager Coordinator [facility] Direct Phone: [phone] Toll Free: [phone] Fax: [phone]

## 2016-11-06 ENCOUNTER — Other Ambulatory Visit
Admission: RE | Admit: 2016-11-06 | Discharge: 2016-11-06 | Disposition: A | Discharge status: Home / Self Care | Payer: PPO | Source: Ambulatory Visit | Attending: Hematology and Oncology | Admitting: Hematology and Oncology

## 2016-11-06 DIAGNOSIS — C8290 Follicular lymphoma, unspecified, unspecified site: Secondary | ICD-10-CM | POA: Insufficient documentation

## 2016-11-06 LAB — COMPREHENSIVE METABOLIC PANEL
ALBUMIN: 3.1 g/dL — AB (ref 3.5–5.0)
ALT: 16 U/L (ref 14–54)
ANION GAP: 12 (ref 5–15)
AST: 25 U/L (ref 15–41)
Alkaline Phosphatase: 64 U/L (ref 38–126)
BUN: 7 mg/dL (ref 6–20)
CHLORIDE: 104 mmol/L (ref 101–111)
CO2: 24 mmol/L (ref 22–32)
Calcium: 8.6 mg/dL — ABNORMAL LOW (ref 8.9–10.3)
Creatinine, Ser: 0.49 mg/dL (ref 0.44–1.00)
GFR calc Af Amer: >60 mL/min (ref >60)
GFR calc non Af Amer: >60 mL/min (ref >60)
GLUCOSE: 109 mg/dL — AB (ref 65–99)
POTASSIUM: 4.3 mmol/L (ref 3.5–5.1)
SODIUM: 140 mmol/L (ref 135–145)
TOTAL PROTEIN: 6 g/dL — AB (ref 6.5–8.1)

## 2016-11-06 LAB — CBC WITH DIFFERENTIAL/PLATELET
BAND NEUTROPHILS: 2 %
BASOS PCT: 0 %
BLASTS: 0 %
Basophils Absolute: 0 10*3/uL (ref 0–0.1)
EOS ABS: 0 10*3/uL (ref 0–0.7)
EOS PCT: 0 %
HEMATOCRIT: 31.7 % — AB (ref 35.0–47.0)
Hemoglobin: 10.3 g/dL — ABNORMAL LOW (ref 12.0–16.0)
LYMPHS ABS: 1.3 10*3/uL (ref 1.0–3.6)
LYMPHS PCT: 24 %
MCH: 29 pg (ref 26.0–34.0)
MCHC: 32.4 g/dL (ref 32.0–36.0)
MCV: 89.6 fL (ref 80.0–100.0)
MONO ABS: 0.6 10*3/uL (ref 0.2–0.9)
MONOS PCT: 11 %
Metamyelocytes Relative: 0 %
Myelocytes: 0 %
NEUTROS ABS: 3.6 10*3/uL (ref 1.4–6.5)
Neutrophils Relative %: 63 %
OTHER: 0 %
PLATELETS: 111 10*3/uL — AB (ref 150–440)
Promyelocytes Absolute: 0 %
RBC: 3.55 MIL/uL — ABNORMAL LOW (ref 3.80–5.20)
RDW: 14.9 % — AB (ref 11.5–14.5)
WBC: 5.5 10*3/uL (ref 3.6–11.0)
nRBC: 0 /100 WBC

## 2016-11-06 LAB — MAGNESIUM: MAGNESIUM: 1.8 mg/dL (ref 1.7–2.4)

## 2016-11-06 NOTE — Telephone Encounter (Signed)
Pt husband called back and wants to know if she can get a different RX. She would like to talk to him. She is out of the medicine but doesn't want to do a refill of the same if Carlis Abbott thinks she can do something else besides metformin.   Best number for [phone]

## 2016-11-07 ENCOUNTER — Other Ambulatory Visit: Payer: Self-pay | Admitting: *Deleted

## 2016-11-07 ENCOUNTER — Encounter: Payer: Self-pay | Admitting: *Deleted

## 2016-11-07 ENCOUNTER — Other Ambulatory Visit: Payer: Self-pay | Admitting: Physician Assistant

## 2016-11-07 MED ORDER — GLIPIZIDE 5 MG PO TABS: 5.0000 mg | ORAL_TABLET | Freq: Every day | ORAL | 0 refills | Status: DC

## 2016-11-07 NOTE — Patient Outreach (Signed)
Successful telephone encounter to [facility], 66 year old female, follow up on referral received 11/06/16 from [facility] telephonic RN CM for Community CM services/transition of care/recent discharge October 21,2018 for stem cell transplant. Pt's history includes but not limited to Lymphoma,Diabetes, HLD, HTN, reflux.   Spoke with pt, HIPAA identifiers verified, discussed purpose of call- follow up on referral for Community CM services/transition of care.  Pt reports doing better, still weak.   Pt reports checking sugar- today 102.   RN CM discussed with pt [facility] transition of care program- follow with weekly calls, a home visit, no cost to her to which pt was in agreement with.  Pt reports not able to make follow up appointment with PCP, called every day, requested help from RN CM at upcoming home visit to call if appointment not made then.    Provided pt with RN CM's name and direct number, [facility] main office number as well as the 24 nurse advice line.     Plan:  As discussed with pt, plan to follow up again next week for initial home visit -   part of ongoing transition of care.             Informed pt's PCP [name] PA of [facility] involvement- Barrier letter sent.   Zara Chess.   [place] Care Management  [phone]

## 2016-11-07 NOTE — Telephone Encounter (Signed)
Please advise 

## 2016-11-07 NOTE — Patient Outreach (Signed)
Unsuccessful telephone encounter to [facility], 66 year old female, follow up on referral received 11/06/16 from [facility] telephonic RN CM for Community CM services/transition of care/recent hospital discharge 11/02/16 stem cell transplant.   Pt's history includes but not limited to Lymphoma, Diabetes, HLD. HTN, reflux.   Unable to leave message on pt's home phone as phone kept ringing, also unable to leave message on pt's cell phone as voice message not set up.     Plan:  RN CM to follow up again telephonically later today.    Beth Stanley.   [place] Care Management  [phone]

## 2016-11-07 NOTE — Patient Outreach (Signed)
Unsuccessful telephone encounter for Beth Stanley, 66 year old female, follow up on referral received 11/06/16 from [facility] telephonic RN CM for Community CM services/transition of care/recent hospital discharge October 21,2018 for stem cell transplant.   Spoke with spouse Herbie Baltimore who requested RN CM call back in 30 minutes as pt is eating.   Plan:  As requested, RN CM to follow up again with pt today telephonically.    Zara Chess.   [place] Care Management  [phone]

## 2016-11-07 NOTE — Telephone Encounter (Signed)
Please call her and let her know I am starting her on glipizide and she can stop the metformin.  If she does not get better with metformin cessation I would like to see her back in the office relatively soon. [name], MS, PA-C 10:38 AM, 11/07/2016

## 2016-11-08 ENCOUNTER — Other Ambulatory Visit: Payer: Self-pay | Admitting: Physician Assistant

## 2016-11-08 MED ORDER — MASKS MISC: 0 refills | Status: DC

## 2016-11-08 MED ORDER — GLIPIZIDE 5 MG PO TABS: 2.5000 mg | ORAL_TABLET | Freq: Two times a day (BID) | ORAL | 1 refills | Status: DC

## 2016-11-11 ENCOUNTER — Other Ambulatory Visit: Payer: Self-pay | Admitting: Physician Assistant

## 2016-11-11 ENCOUNTER — Other Ambulatory Visit: Payer: Self-pay | Admitting: Pharmacist

## 2016-11-11 DIAGNOSIS — I10 Essential (primary) hypertension: Secondary | ICD-10-CM | POA: Diagnosis not present

## 2016-11-11 DIAGNOSIS — Z79899 Other long term (current) drug therapy: Secondary | ICD-10-CM | POA: Diagnosis not present

## 2016-11-11 DIAGNOSIS — G8929 Other chronic pain: Secondary | ICD-10-CM | POA: Diagnosis not present

## 2016-11-11 DIAGNOSIS — Z9484 Stem cells transplant status: Secondary | ICD-10-CM | POA: Diagnosis not present

## 2016-11-11 DIAGNOSIS — C8290 Follicular lymphoma, unspecified, unspecified site: Secondary | ICD-10-CM | POA: Diagnosis not present

## 2016-11-11 DIAGNOSIS — E119 Type 2 diabetes mellitus without complications: Secondary | ICD-10-CM | POA: Diagnosis not present

## 2016-11-11 DIAGNOSIS — M25511 Pain in right shoulder: Secondary | ICD-10-CM | POA: Diagnosis not present

## 2016-11-11 DIAGNOSIS — Z52011 Autologous donor, stem cells: Secondary | ICD-10-CM | POA: Diagnosis not present

## 2016-11-11 DIAGNOSIS — C8330 Diffuse large B-cell lymphoma, unspecified site: Secondary | ICD-10-CM | POA: Diagnosis not present

## 2016-11-11 DIAGNOSIS — Z7984 Long term (current) use of oral hypoglycemic drugs: Secondary | ICD-10-CM | POA: Diagnosis not present

## 2016-11-11 DIAGNOSIS — Z9221 Personal history of antineoplastic chemotherapy: Secondary | ICD-10-CM | POA: Diagnosis not present

## 2016-11-11 NOTE — Patient Outreach (Signed)
[facility]) Care Management  [place]   11/11/2016  Beth Stanley December 19, 1950 [id number]  66 year old female referred to [place] Management by [employer] [facility] [employer] for transition of care.  [place] services requested for medication assistance.  PMHx includes, but not limited to, type II diabetes, depression, HTN, HLD, GERD, spondylosis, and nodular lymphocyte predominant Hodgkin lymphoma.   Noted recent medication change from metformin to glipizide due to stomach upset.    Unsuccessful telephone call to [name] today.  There is not a voicemail set up on the home or mobile number to leave a message.   Plan: I will call patient later this week regarding medication assistance.   Ralene Bathe, PharmD, [place] [phone]

## 2016-11-12 ENCOUNTER — Other Ambulatory Visit: Payer: Self-pay | Admitting: *Deleted

## 2016-11-12 NOTE — Patient Outreach (Signed)
Arrived at pt's home for initial home visit, referral received from [facility] for Community CM services/transition of care/recent hospital discharge October 21,2018 for stem cell transplant.  This RN CM was met at the door by pt's spouse Herbie Baltimore, HIPAA identifiers provided on pt.  Spouse reports this is not a good day for a home visit, pt sick on stomach/lying down.  Spouse reports pt had lab work done yesterday, results good/Hemoglobin 10 which is good with pt being 22 days into the  transplant. Spouse reports pt's vitals were good also, next follow up is not until November 8.   Spouse reports Juanita told pt [employer] could help her with cost of medication and transportation to which RN CM  Relayed  Referrals can be made for [facility] pharmacist and LCSW.   Spouse requested RN CM to call back today in a couple of hours so can speak with pt.    Plan:  As requested by spouse, no home visit done today, RN CM to follow up telephonically later today- check on status.    Note:  [place] in [place],  [place] pharmacist attempted to call pt yesterday, follow up on referral for [place] services/medication assistance.  Unable to leave a voice mail, voice message not set up.    Zara Chess.   [place] Care Management  [phone]

## 2016-11-12 NOTE — Patient Outreach (Signed)
4:29 pm : As requested by spouse, follow up call made by RN CM to check on pt's status/initial home visit declined by pt and spouse earlier today.    This RN CM following pt for transition of care (referral received from [place] liaison),recent hospitalization for Stem cell transplant at [facility] home October 21,2018.  Pt's history includes but not limited to Diabetes, Lymphoma, Hypertension,Reflux,Depression,  Paresthesia.  Spoke with spouse, HIPAA identifiers provided on pt, heard  spouse inquired of pt if wanted to talk with RN CM to which pt declined/per spouse pt too sick to talk/vomited twice this morning, one time since home after lab work, no fever.   Spouse reports pt went to work today for 4 hrs, thinks was too much. Spouse reports pt did not take any of her medications today, thought she would loose it (vomit).  Spouse reviewed pt's medications with RN CM, confirmed  pt has Compazine and Zofran to use if needed, has not taken either today,inquired if okay for pt to take if feels better.  RN CM discussed with spouse both Compazine and Zofran are for nausea/vomiting, okay for pt to take one or the other.  Spouse also inquired if pt is okay to take her medications after that to which RN CM suggested  to start back tomorrow am with it being late in the day.   Plan:  As discussed with spouse, to follow up again tomorrow telephonically- check on status.    Zara Chess.   [place] Care Management  [phone]

## 2016-11-13 ENCOUNTER — Other Ambulatory Visit: Payer: Self-pay | Admitting: Pharmacist

## 2016-11-13 ENCOUNTER — Ambulatory Visit: Payer: Self-pay | Admitting: Pharmacist

## 2016-11-13 NOTE — Patient Outreach (Signed)
[facility]) Care Management  [place]   11/13/2016  BRONDA ALFRED Jan 02, 1951 [id number]  Subjective:  66 year old female referred to [place] Management for transition of care.  [facility] services requested for medication assistance.  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    Successful outreach attempt to Ms. Masae Hepler's residence.  Patient's spouse, Herbie Baltimore, answered the phone and reported that Ms. Wester is not feeling well today but he is able to review her medications with me.  HIPAA identifiers verified.  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  He reports she is currently post-transplant day #23 and her blood work is "good."  He reports she has another appointment with Dr. Merrilyn Puma next week on 11/20/2016. Herbie Baltimore reports that [facility] checks her blood sugar QID and readings are usually ~100.  Herbie Baltimore denies any specific medication assistance needs at this time.    Objective:   Encounter Medications: Outpatient Encounter Prescriptions as of 11/13/2016  Medication Sig Note  . acyclovir (ZOVIRAX) 800 MG tablet Take 800 mg by mouth 2 (two) times daily.   Marland Kitchen albuterol (PROVENTIL HFA;VENTOLIN HFA) 108 (90 Base) MCG/ACT inhaler Inhale 2 puffs into the lungs every 6 (six) hours as needed for wheezing or shortness of breath (cough, shortness of breath or wheezing.).   Marland Kitchen ALPRAZolam (XANAX) 0.5 MG tablet Take 1/2 to 1 tabs as needed for panic.   . blood glucose meter kit and supplies KIT Test blood sugar once daily. Dx code: E11.9   . diphenoxylate-atropine (LOMOTIL) 2.5-0.025 MG tablet Take 1 tablet by mouth 4 (four) times daily as needed for diarrhea or loose stools.   . folic acid (FOLVITE) 1 MG tablet Take 1 mg by mouth daily.   Marland Kitchen FREESTYLE LITE test strip TEST BLOOD SUGAR ONCE DAILY   .  gabapentin (NEURONTIN) 300 MG capsule Take 1 tablet each night for peripheral neuropathy (Patient taking differently: Take 1 capsule each night for peripheral neuropathy)   . Lancets (FREESTYLE) lancets TEST BLOOD GLUCOSE ONCE DAILY   . loperamide (IMODIUM) 2 MG capsule Take 2 mg by mouth as needed for diarrhea or loose stools.   Marland Kitchen loratadine (CLARITIN) 10 MG tablet Take 10 mg by mouth daily as needed for allergies.   . Masks MISC Prevention of infection.   . metFORMIN (GLUCOPHAGE) 500 MG tablet Take 1,000 mg by mouth 2 (two) times daily with a meal.   . Multiple Vitamin (MULTIVITAMIN) tablet Take 1 tablet by mouth daily.   Marland Kitchen omeprazole (PRILOSEC) 20 MG capsule Take 20 mg by mouth daily.   . ondansetron (ZOFRAN) 8 MG tablet Take by mouth every 8 (eight) hours as needed for nausea or vomiting.   . prochlorperazine (COMPAZINE) 10 MG tablet TAKE ONE TABLET BY MOUTH EVERY EIGHT HOURS AS NEEDED FOR NAUSEA AND VOMITING   . ramipril (ALTACE) 5 MG capsule Take 1 capsule (5 mg total) by mouth daily. One daily   . sertraline (ZOLOFT) 100 MG tablet TAKE ONE (1) TABLET EACH DAY   . [START ON 11/19/2016] sulfamethoxazole-trimethoprim (BACTRIM DS,SEPTRA DS) 800-160 MG tablet Take 1 tablet by mouth 3 (three) times a week.   . [DISCONTINUED] lidocaine (XYLOCAINE) 2 % solution Use as directed 20 mLs in the mouth or throat as needed for mouth pain.   Marland Kitchen clopidogrel (PLAVIX)  75 MG tablet Take 1 tablet (75 mg total) by mouth daily. (Patient not taking: Reported on 11/13/2016) 11/13/2016: Per spouse, not taking per hematologist after stem cell transplant, may resume in future after counts recover and stable  . valACYclovir (VALTREX) 1000 MG tablet TAKE 1/2 TABLET BY MOUTH EVERY DAY (Patient not taking: Reported on 11/13/2016)   . [DISCONTINUED] clindamycin (CLINDAGEL) 1 % gel Apply topically 2 (two) times daily.   . [DISCONTINUED] famotidine (PEPCID) 40 MG tablet Take 1 tablet (40 mg total) by mouth at bedtime.   .  [DISCONTINUED] glipiZIDE (GLUCOTROL) 5 MG tablet Take 0.5 tablets (2.5 mg total) by mouth 2 (two) times daily before a meal.   . [DISCONTINUED] HYDROcodone-acetaminophen (NORCO) 5-325 MG tablet Take 0.5-1 tablets by mouth every 8 (eight) hours. For severe pain only. Do not mix with alcohol, benzodiazepines, muscle relaxers.   . [DISCONTINUED] nicotine (NICOTROL) 10 MG inhaler Inhale 1 Cartridge (1 continuous puffing total) into the lungs as needed for smoking cessation.   . [DISCONTINUED] traMADol (ULTRAM) 50 MG tablet Take 0.5-2 tablets (25-100 mg total) by mouth every 8 (eight) hours as needed. Increase dose from 25-100 over days as needed for pain.    No facility-administered encounter medications on file as of 11/13/2016.     Functional Status: No flowsheet data found.  Fall/Depression Screening: Fall Risk  11/04/2016 05/26/2016 04/14/2016  Falls in the past year? Yes No No  Number falls in past yr: 1 - -  Injury with Fall? Yes - -  Risk for fall due to : History of fall(s) - -  Follow up Falls evaluation completed - -   PHQ 2/9 Scores 11/04/2016 05/26/2016 04/14/2016 04/07/2016 06/27/2015 05/18/2014 01/24/2014  PHQ - 2 Score 1 0 0 0 0 3 0  PHQ- 9 Score - - - - - 5 -      Assessment: Drugs sorted by system:  Neurologic/Psychologic: alprazolam, gabapentin, sertraline  Cardiovascular: clopidogrel, ramipril  Pulmonary/Allergy: albuterol, loratadine, omeprazole  Gastrointestinal:PRN lomotil, loperamide, prochlorperazine, ondansetron  Endocrine:metformin  Vitamins/Minerals:MVI, folic acid  Infectious Diseases: acyclovir, valacyclovir, bactrim   Duplications in therapy:  Patient taking prophylactic acyclovir and also has PRN valacyclovir.  I reviewed with spouse that these are both antiviral medications and patient should not need to take valacyclovir while also taking acyclovir.   Other issues noted:  Clopidogrel on hold for now.  Patient will follow-up on when to restart this with  hematologist at next office visit.    Medication assistance: Patient's spouse denies any specific medication assistance needs at this time.   Patient's spouse would like me to mail him a business card so he can contact me in the future if needed.   I verbally provided my name and number telephonically as well.   Plan: [place] will close patient case at this time as no further medication needs noted.  I will alert [facility] RN Rose of pharmacy case closure.  I will mail patient a letter with my contact information as requested.    Ralene Bathe, PharmD, [place] [phone]

## 2016-11-14 ENCOUNTER — Other Ambulatory Visit: Payer: Self-pay | Admitting: *Deleted

## 2016-11-14 NOTE — Patient Outreach (Signed)
Successful telephone encounter to [facility] : [facility], 66 year old female for transition of care/ongoing follow up on recent hospitalization October 15-21,2018 at [facility] for Autologous stem cell transplant.  Referral received 11/06/16 from [facility] telephonic RN CM for Community CM services.  Pt's history includes but not limited to Lymphoma, Diabetes, HLD, HTN, reflux.   Spoke with pt, HIPAA identifiers verified.  Pt reports was sick 2 days ago, doing better now, however dizzy headed.  Pt reports some of her BP medications were stopped in the hospital because BP was low, wondering if BP is up.  RN CM inquired of pt if have a BP monitor to which pt said she does, sister on her way now to her house, will check her BP.  RN CM discussed with pt if  BP elevated to call MD as well as continuing to check daily to which pt voiced understanding.  Pt reports taking all of her medications, just missed that one day when she was so sick.  Pt reports to follow up with Dr. Dellis Filbert next week.  Pt reports having a little nausea, using Zofran,Compazine  if needed, helping, appetite better.   RN CM discussed with pt to either use Zofran or Compazine, both  do the same thing to which voiced understanding.    Plan:  As discussed with pt, plan to follow up again next week telephonically- part of ongoing transition of care,            Initial home visit the following week.    Zara Chess.   [place] Care Management  [phone]

## 2016-11-15 ENCOUNTER — Telehealth: Payer: Self-pay | Admitting: Physician Assistant

## 2016-11-15 NOTE — Telephone Encounter (Signed)
Spoke with patient --- onset of low blood pressure for three weeks.  During admission, blood pressure was running low so stopped medications.  Restarted blood pressure medications; quit taking this morning.  Feels some better.  Stopped Altace 5mg  daily.  Last dose was yesterday morning; feeling some better. This morning, 93/62 L 91/66 R this morning.  Less lightheaded.  Yesterday, 66/54.   Advised transplant provider of lower of low readings yesterday; advised to contact PCP.  No fever/chills/sweats.  Also had to quit taking Metformin.  Blood sugars 114; this morning, 141. Only taking.  Eating and drinking well.  Puzzled why dizziness.  Dizziness started yesterday; dizziness is much better today. Has someone with pt at all times. Just had blood work at [facility] this week.   A/P: Dizziness with hypotension; STOP/HOLD Altace; check BP several times throughout the day. To ED for worsening dizziness or recurrent severe hypotension.  Pt refuses office visit today since recent transplant. Advised to call on Monday with update.

## 2016-11-15 NOTE — Telephone Encounter (Signed)
PATIENT STATES [name] TOLD HER SHE COULD CALL HIM AT HOME IF SHE NEEDED ANYTHING. SHE SAID SHE HAD A STEM CELL TRANSPLANT AND SINCE THEN HER BLOOD PRESSURE HAS BEEN VERY LOW. LAST NIGHT WHILE SITTING IT WAS 73/56 HEART RATE 100, STANDING IT WAS 78/55 HEART RATE 110, AND LAYING IT WAS 63/44 HEART RATE 97. THIS MORNING WHILE SITTING IT WAS 93/62 IN THE (L) ARM AND 91/66 IN THE (R) ARM AND HER HEART RATE WAS 95. SHE SAID SHE DID NOT TAKE HER BLOOD PRESSURE MEDICINE THIS MORNING. [facility] SAID THEY CAN NOT ADJUST HER MEDICINE AND THAT MICHAEL WILL HAVE TO DO IT. SHE SAID SHE WILL NOT CALL MICHAEL BUT WANTS [place] TO DO IT. BEST PHONE [phone] (CELL) [place]

## 2016-11-17 NOTE — Telephone Encounter (Signed)
Agree with Dr. [place] Caul management. She may need to see someone while I am out of the office. Defer to Dr. Tamala Julian until I get back to the office, as there may be other reasons why she is feeling lightheaded (UTI, new onset anemia, diarrhea, bradycardia) and these can be hard to determine without seeing her. If she puts up a fight about coming in just let her know I am aware of the situation and that I really want her to be seen. We have masks at the door that she can wear to prevent aerosolized germs.   [name], MS, PA-C 12:55 PM, 11/17/2016

## 2016-11-17 NOTE — Telephone Encounter (Signed)
Patient called in to give Beth Stanley her BP readings  Friday--93/62 left arm---right 91/66 Sunday--100/66--91/62--101/62---90/70 Monday morning 102/65  Her call back number is [phone]

## 2016-11-17 NOTE — Telephone Encounter (Signed)
Pt has never seen Dr. Tamala Julian. I assume this is for you.

## 2016-11-18 ENCOUNTER — Other Ambulatory Visit: Payer: Self-pay | Admitting: *Deleted

## 2016-11-18 NOTE — Telephone Encounter (Signed)
Spoke with pt this morning to follow-up with her about her blood pressures and to see if she was still feeling light headed. She informed me she has not been feeling light headed for the last few days but it is still occurring. She also informed me of her b/p for the last two days which are:   11/3 100/66 91/62 101/69  11/4 90/70 102/65 103/67  Please Advise

## 2016-11-18 NOTE — Telephone Encounter (Signed)
Noted. I recommend follow-up in office in upcoming 1-2 weeks for further evaluation. Please advise her that PA Carlis Abbott also strongly recommends that she be evaluated despite her recent health issues and procedures.

## 2016-11-18 NOTE — Telephone Encounter (Signed)
Please call patient with update on blood pressures and feeling lightheaded.

## 2016-11-18 NOTE — Patient Outreach (Signed)
Successful telephone encounter to [facility], 66 year old female -ongoing follow up on referral received 11/06/16 from [facility] telephonic RN CM for Community services/transition of care/ recent hospitalization October 15-21,2018 at [facility] for Autologous stem cell transplant.  Pt's history includes but not limited to  Lymphoma, Diabetes, HLD, HTN, reflux.   Spoke with pt, HIPAA identifiers verified.  Pt reports feels better today, talked to Dr. Dellis Filbert (surgeon) yesterday, took her off multivitamin pill, thinks that is what was making her stomach hurt.   Pt reports on recent blood sugars- [phone], not taking any medication.  Pt reports BP has been  Running low- BP today was 109/69,94/68.  Pt reports did not schedule a follow up appointment with PCP, they  Don't want her to come in, talked on the phone 3-4 times (low BP), Altace was stopped/made a big difference,has a little dizziness today.  Pt reports to see Dr. Dellis Filbert 11/8.  Pt reports trying to baby appetite- jello, applesauce to which RN CM encouraged small frequent meals.    Plan:  As discussed with pt, plan to follow up again next week for initial home visit.    Zara Chess.   [place] Care Management  [phone]

## 2016-11-19 NOTE — Telephone Encounter (Signed)
Attempted to call pt. No answer. Placed CRM for return call.

## 2016-11-19 NOTE — Telephone Encounter (Signed)
Hold altace. I think she should come in.

## 2016-11-19 NOTE — Telephone Encounter (Signed)
Okay to hold her Altair for right now. I do think she should come in.

## 2016-11-19 NOTE — Telephone Encounter (Signed)
Beth Stanley can you please call patient and try to get her in to see Dr. Tamala Julian. I worry that something else may be going on because I think Dr. Tamala Julian adjusted her BP meds via her last message with patient. Please let the [facility] know I am currently in the hospital with my new baby and don't have access to computer epic.

## 2016-11-20 DIAGNOSIS — Z79899 Other long term (current) drug therapy: Secondary | ICD-10-CM | POA: Diagnosis not present

## 2016-11-20 DIAGNOSIS — Z888 Allergy status to other drugs, medicaments and biological substances status: Secondary | ICD-10-CM | POA: Diagnosis not present

## 2016-11-20 DIAGNOSIS — Z794 Long term (current) use of insulin: Secondary | ICD-10-CM | POA: Diagnosis not present

## 2016-11-20 DIAGNOSIS — G8929 Other chronic pain: Secondary | ICD-10-CM | POA: Diagnosis not present

## 2016-11-20 DIAGNOSIS — E119 Type 2 diabetes mellitus without complications: Secondary | ICD-10-CM | POA: Diagnosis not present

## 2016-11-20 DIAGNOSIS — Z9484 Stem cells transplant status: Secondary | ICD-10-CM | POA: Diagnosis not present

## 2016-11-20 DIAGNOSIS — G893 Neoplasm related pain (acute) (chronic): Secondary | ICD-10-CM | POA: Diagnosis not present

## 2016-11-20 DIAGNOSIS — I10 Essential (primary) hypertension: Secondary | ICD-10-CM | POA: Diagnosis not present

## 2016-11-20 DIAGNOSIS — Z08 Encounter for follow-up examination after completed treatment for malignant neoplasm: Secondary | ICD-10-CM | POA: Diagnosis not present

## 2016-11-20 DIAGNOSIS — C8330 Diffuse large B-cell lymphoma, unspecified site: Secondary | ICD-10-CM | POA: Diagnosis not present

## 2016-11-20 DIAGNOSIS — Z8572 Personal history of non-Hodgkin lymphomas: Secondary | ICD-10-CM | POA: Diagnosis not present

## 2016-11-20 DIAGNOSIS — M25511 Pain in right shoulder: Secondary | ICD-10-CM | POA: Diagnosis not present

## 2016-11-25 ENCOUNTER — Other Ambulatory Visit: Payer: Self-pay | Admitting: *Deleted

## 2016-11-25 NOTE — Telephone Encounter (Signed)
Pt advised to make an appointment

## 2016-11-25 NOTE — Patient Outreach (Signed)
11:46 am- called pt's home number, spouse answered, requested to speak with pt to which spouse provided another number to call pt.   11:47 am-  Successful telephone encounter to [facility], 65 year old female for transition of care/ongoing follow up on recent hospitalization October 15-21,2018 at [facility] for Autologous Stem  Cell transplant.  Pt's history includes but not limited to Lymphoma, Diabetes, HLD, HTN, reflux.  Spoke  With pt, HIPAA identifiers verified, discussed arrived at her home this am for home visit to which pt forgot.   Pt reports she came back to work yesterday, doing great, no dizziness, nausea/appetite good.  Pt reports BP is good, check everyday, yesterday was 107/76.   Pt reports on recent visit with Dr. Assunta Curtis assistant- labs done/everything good, told him going back to work/okay with that.  RN CM discussed with pt doing a home visit with her back to work now to which pt declined a home visit at this time.     Plan:   As discussed with pt, plan to follow up later today telephonically, currently at work.   This RN CM to address missing elements for documentation.     Zara Chess.   [place] Care Management  [phone]

## 2016-11-25 NOTE — Patient Outreach (Signed)
Successful telephone encounter to [facility], 60 year of female- follow up call  to complete with pt  missing elements for documentation.   This RN CM following pt for transition of care/recent hospitalization October 15-21,2018 at [facility] for Autologous Stem Cell Transplant.  Spoke with pt, HIPAA identifiers verified, as discussed with pt earlier today- call to complete missing elements for documentation.   Pt reports taking all of her medications as ordered/medication review completed with pt.    Plan:  As discussed with pt, will continue to receive weekly transition of care calls for the next 2 weeks from RN CM coworkers covering for this RN CM and this RN CM will follow up again next month telephonically- check on clinical status.    Zara Chess.   [place] Care Management  [phone]

## 2016-11-25 NOTE — Patient Outreach (Signed)
[facility] [facility]) Care Management  11/25/2016  Beth Stanley July 22, 1950 [id number]   Arrived at pt's home for initial home visit, knocked on door back/front several times/ no response, called cell phone number twice, unable to leave message as voice message not set up.   This RN CM following pt for  Transition of care/recent hospital discharge October 15-21,2018 at [facility] for Autologous stem  Cell transplant.     Plan:  RN CM to call pt later today, check on status.     Zara Chess.   [place] Care Management  [phone]

## 2016-11-26 ENCOUNTER — Ambulatory Visit: Payer: Self-pay | Admitting: *Deleted

## 2016-12-03 ENCOUNTER — Other Ambulatory Visit
Admission: RE | Admit: 2016-12-03 | Discharge: 2016-12-03 | Disposition: A | Discharge status: Home / Self Care | Payer: PPO | Source: Ambulatory Visit | Attending: Hematology and Oncology | Admitting: Hematology and Oncology

## 2016-12-03 DIAGNOSIS — C8580 Other specified types of non-Hodgkin lymphoma, unspecified site: Secondary | ICD-10-CM | POA: Insufficient documentation

## 2016-12-03 DIAGNOSIS — Z9484 Stem cells transplant status: Secondary | ICD-10-CM | POA: Diagnosis not present

## 2016-12-03 LAB — COMPREHENSIVE METABOLIC PANEL
ALBUMIN: 3.6 g/dL (ref 3.5–5.0)
ALK PHOS: 88 U/L (ref 38–126)
ALT: 14 U/L (ref 14–54)
ANION GAP: 8 (ref 5–15)
AST: 23 U/L (ref 15–41)
BUN: 9 mg/dL (ref 6–20)
CO2: 26 mmol/L (ref 22–32)
CREATININE: 0.72 mg/dL (ref 0.44–1.00)
Calcium: 8.7 mg/dL — ABNORMAL LOW (ref 8.9–10.3)
Chloride: 104 mmol/L (ref 101–111)
GFR calc Af Amer: >60 mL/min (ref >60)
GFR calc non Af Amer: >60 mL/min (ref >60)
GLUCOSE: 119 mg/dL — AB (ref 65–99)
Potassium: 3.5 mmol/L (ref 3.5–5.1)
Sodium: 138 mmol/L (ref 135–145)
TOTAL PROTEIN: 6.5 g/dL (ref 6.5–8.1)
Total Bilirubin: 0.4 mg/dL (ref 0.3–1.2)

## 2016-12-03 LAB — CBC WITH DIFFERENTIAL/PLATELET
BASOS PCT: 1 %
Basophils Absolute: 0 10*3/uL (ref 0–0.1)
Eosinophils Absolute: 0 10*3/uL (ref 0–0.7)
Eosinophils Relative: 1 %
HEMATOCRIT: 29.9 % — AB (ref 35.0–47.0)
HEMOGLOBIN: 10 g/dL — AB (ref 12.0–16.0)
LYMPHS ABS: 2.2 10*3/uL (ref 1.0–3.6)
Lymphocytes Relative: 38 %
MCH: 30.2 pg (ref 26.0–34.0)
MCHC: 33.4 g/dL (ref 32.0–36.0)
MCV: 90.3 fL (ref 80.0–100.0)
MONOS PCT: 12 %
Monocytes Absolute: 0.7 10*3/uL (ref 0.2–0.9)
NEUTROS ABS: 2.8 10*3/uL (ref 1.4–6.5)
NEUTROS PCT: 48 %
Platelets: 142 10*3/uL — ABNORMAL LOW (ref 150–440)
RBC: 3.31 MIL/uL — AB (ref 3.80–5.20)
RDW: 18 % — ABNORMAL HIGH (ref 11.5–14.5)
WBC: 5.7 10*3/uL (ref 3.6–11.0)

## 2016-12-03 LAB — MAGNESIUM: Magnesium: 1.7 mg/dL (ref 1.7–2.4)

## 2016-12-05 ENCOUNTER — Other Ambulatory Visit: Payer: Self-pay | Admitting: *Deleted

## 2016-12-05 NOTE — Patient Outreach (Signed)
[place] [facility]) Care Management  12/05/2016  Beth Stanley [birth year] [id number]   Covering for assigned care manager, R. Pierzchala.  Weekly transition of care call placed to member, unsuccessful.  Unable to leave message as voice mail has not been set up.  Will follow up next week.  Valente David, MSN, RN [facility] Care Management  [facility] Manager [phone]

## 2016-12-08 ENCOUNTER — Other Ambulatory Visit: Payer: Self-pay | Admitting: *Deleted

## 2016-12-08 NOTE — Patient Outreach (Signed)
[place] [facility]) Care Management  12/08/2016  Beth Stanley Jan 28, 1950 [id number]   Covering for assigned care manager, R. Pierzchala.  Call placed to member for transition of care.  She report she is doing well, denies any pain or discomfort, denies dizziness.  State she has continued to be compliant with medications, working her job without problems.  She denies any concerns at this time.  Made aware that transition of care program complete, will have assigned care manager follow up within the next 2 weeks.   [facility] CM Care Plan Problem One     Most Recent Value  Care Plan Problem One  Risk for readmission related to recent hospitalization for stem cell transplant   Role Documenting the Problem One  Care Management Beth Stanley for Problem One  Not Active  [facility] Long Term Goal   Pt would not readmit to the hospital within the next 31 days   [facility] Long Term Goal Start Date  11/07/16  [facility] Long Term Goal Met Date  12/08/16  Interventions for Problem One Long Term Goal  Discussed with pt current clinical status- BP, dizziness- per pt BP good, no dizziness, back to work   [facility] CM Short Term Goal #1   Pt's weakness would improve within the next 30 days   [facility] CM Short Term Goal #1 Start Date  11/07/16  [facility] CM Short Term Goal #1 Met Date  11/25/16  Interventions for Short Term Goal #1  Discussed with pt small frequent meals- nutrition.strength   [facility] CM Short Term Goal #2   Pt will keep all MD appointments in the next 30 days   [facility] CM Short Term Goal #2 Start Date  11/07/16  [facility] CM Short Term Goal #2 Met Date  12/08/16  Interventions for Short Term Goal #2  Discussed with pt recent MD visits- per pt f/u with surgeon's assistant- good report/labs good.       Valente David, MSN, RN [facility] Care Management  [facility] Manager [phone]

## 2016-12-16 ENCOUNTER — Other Ambulatory Visit: Payer: Self-pay | Admitting: *Deleted

## 2016-12-16 NOTE — Patient Outreach (Signed)
Program and Care plan updated.   Plan to follow up with pt later this month telephonically, check on clinical status as this RN CM followed pt for transition of care/recent hospitalization October 15-21,2018 at [facility] for Autologous Stem Cell transplant, Transition of care program completed 12/08/16 by coworker Brayton Layman RN CM covering for this RN CM.    Zara Chess.   [place] Care Management  [phone]

## 2016-12-18 DIAGNOSIS — M47817 Spondylosis without myelopathy or radiculopathy, lumbosacral region: Secondary | ICD-10-CM | POA: Diagnosis not present

## 2016-12-18 DIAGNOSIS — Z9484 Stem cells transplant status: Secondary | ICD-10-CM | POA: Diagnosis not present

## 2016-12-18 DIAGNOSIS — M8938 Hypertrophy of bone, other site: Secondary | ICD-10-CM | POA: Diagnosis not present

## 2016-12-18 DIAGNOSIS — M769 Unspecified enthesopathy, lower limb, excluding foot: Secondary | ICD-10-CM | POA: Diagnosis not present

## 2016-12-18 DIAGNOSIS — C8330 Diffuse large B-cell lymphoma, unspecified site: Secondary | ICD-10-CM | POA: Diagnosis not present

## 2016-12-18 DIAGNOSIS — C8290 Follicular lymphoma, unspecified, unspecified site: Secondary | ICD-10-CM | POA: Diagnosis not present

## 2016-12-18 DIAGNOSIS — M533 Sacrococcygeal disorders, not elsewhere classified: Secondary | ICD-10-CM | POA: Diagnosis not present

## 2016-12-18 DIAGNOSIS — M778 Other enthesopathies, not elsewhere classified: Secondary | ICD-10-CM | POA: Diagnosis not present

## 2016-12-18 DIAGNOSIS — M1611 Unilateral primary osteoarthritis, right hip: Secondary | ICD-10-CM | POA: Diagnosis not present

## 2016-12-18 DIAGNOSIS — M47818 Spondylosis without myelopathy or radiculopathy, sacral and sacrococcygeal region: Secondary | ICD-10-CM | POA: Diagnosis not present

## 2016-12-19 DIAGNOSIS — C8335 Diffuse large B-cell lymphoma, lymph nodes of inguinal region and lower limb: Secondary | ICD-10-CM | POA: Diagnosis not present

## 2016-12-19 DIAGNOSIS — M25551 Pain in right hip: Secondary | ICD-10-CM | POA: Diagnosis not present

## 2017-01-08 ENCOUNTER — Other Ambulatory Visit: Payer: Self-pay | Admitting: Physician Assistant

## 2017-01-08 ENCOUNTER — Encounter: Payer: Self-pay | Admitting: *Deleted

## 2017-01-08 ENCOUNTER — Other Ambulatory Visit: Payer: Self-pay | Admitting: *Deleted

## 2017-01-08 NOTE — Patient Outreach (Signed)
Successful telephone encounter with Beth Stanley, 66 year old female- follow up on current clinical status as this RN CM followed pt for transition of care/recent hospitalization October 15-21,2018 at [facility] for Autologous Stem Cell transplant, transition of care program completed 12/08/16.    Spoke with pt's spouse prior this call who requested RN CM to call pt at her place of work.   Spoke with pt, HIPAA identifiers verified.  Pt reports doing good/currently at work- no issues with Stem Cell transplant, appetite good, energy good, taking all of her medications, sugars are fine- 109.  Pt reports has an appointment tomorrow with female MD (OB/GYN) for an Ultrasound- check her fallopian tubes.  Pt reports also to follow up with MD (view in EMR Dr. Dellis Filbert- Radiology) 01/29/17.   RN CM discussed with pt plan to close her case, no further case management needs to which pt agreed.    Also verified with pt has   RN CM's contact number as well as [facility]'s main office number to call if needs arise in the future.   Plan:  As discussed with pt, plan to close her case.           Plan to inform PCP of discharge from [facility] CM services- send case closure letter.          Plan to inform [facility] CMA to close case.   Beth Stanley.   [place] Care Management  [phone]

## 2017-01-08 NOTE — Telephone Encounter (Signed)
Can not find Ultram in medication list-  Omeprazole needs appointment

## 2017-01-09 DIAGNOSIS — R102 Pelvic and perineal pain: Secondary | ICD-10-CM | POA: Diagnosis not present

## 2017-01-15 ENCOUNTER — Telehealth: Payer: Self-pay | Admitting: Physician Assistant

## 2017-01-15 NOTE — Telephone Encounter (Signed)
Copied from [place] [id number]. Topic: General - Other >> Jan 15, 2017 12:15 PM Lolita Rieger, RMA wrote: Reason for CRM: pt called requesting refill for prilosec I explained to her that it is documented that she would need and appointment and she stated that she has just had a stem cell transplant and can not come in at this time Please advise and contact pt as she is out of medication

## 2017-01-17 ENCOUNTER — Encounter: Payer: Self-pay | Admitting: Physician Assistant

## 2017-01-17 NOTE — Telephone Encounter (Signed)
Called in refill for Prilosec per patients request. Pharmacist confirmed the refill via telephone.

## 2017-01-22 ENCOUNTER — Other Ambulatory Visit: Payer: Self-pay | Admitting: Physician Assistant

## 2017-01-29 DIAGNOSIS — Z9484 Stem cells transplant status: Secondary | ICD-10-CM | POA: Diagnosis not present

## 2017-01-29 DIAGNOSIS — C8290 Follicular lymphoma, unspecified, unspecified site: Secondary | ICD-10-CM | POA: Diagnosis not present

## 2017-02-05 DIAGNOSIS — K8080 Other cholelithiasis without obstruction: Secondary | ICD-10-CM | POA: Diagnosis not present

## 2017-02-05 DIAGNOSIS — K828 Other specified diseases of gallbladder: Secondary | ICD-10-CM | POA: Diagnosis not present

## 2017-02-05 DIAGNOSIS — K8020 Calculus of gallbladder without cholecystitis without obstruction: Secondary | ICD-10-CM | POA: Diagnosis not present

## 2017-02-05 DIAGNOSIS — T451X5A Adverse effect of antineoplastic and immunosuppressive drugs, initial encounter: Secondary | ICD-10-CM | POA: Diagnosis not present

## 2017-02-05 DIAGNOSIS — Z52011 Autologous donor, stem cells: Secondary | ICD-10-CM | POA: Diagnosis not present

## 2017-02-05 DIAGNOSIS — Z9484 Stem cells transplant status: Secondary | ICD-10-CM | POA: Diagnosis not present

## 2017-02-05 DIAGNOSIS — C8330 Diffuse large B-cell lymphoma, unspecified site: Secondary | ICD-10-CM | POA: Diagnosis not present

## 2017-02-05 DIAGNOSIS — N898 Other specified noninflammatory disorders of vagina: Secondary | ICD-10-CM | POA: Diagnosis not present

## 2017-02-05 DIAGNOSIS — K829 Disease of gallbladder, unspecified: Secondary | ICD-10-CM | POA: Diagnosis not present

## 2017-02-05 DIAGNOSIS — C8510 Unspecified B-cell lymphoma, unspecified site: Secondary | ICD-10-CM | POA: Diagnosis not present

## 2017-02-06 ENCOUNTER — Other Ambulatory Visit: Payer: Self-pay | Admitting: Physician Assistant

## 2017-02-06 DIAGNOSIS — R112 Nausea with vomiting, unspecified: Secondary | ICD-10-CM

## 2017-02-14 ENCOUNTER — Telehealth: Payer: Self-pay | Admitting: Physician Assistant

## 2017-02-14 MED ORDER — OMEPRAZOLE 20 MG PO CPDR: 20.0000 mg | DELAYED_RELEASE_CAPSULE | Freq: Every day | ORAL | 3 refills | Status: DC

## 2017-02-14 NOTE — Telephone Encounter (Signed)
Patient requesting refills of her PPI.  She is in the midst of being treated for leukemia [facility]  I will provide her with refills.

## 2017-02-16 ENCOUNTER — Other Ambulatory Visit: Payer: Self-pay

## 2017-02-16 ENCOUNTER — Ambulatory Visit (INDEPENDENT_AMBULATORY_CARE_PROVIDER_SITE_OTHER): Payer: PPO | Admitting: Physician Assistant

## 2017-02-16 ENCOUNTER — Encounter: Payer: Self-pay | Admitting: Physician Assistant

## 2017-02-16 VITALS — BP 132/78 | HR 86 | Resp 16 | Ht 63.78 in

## 2017-02-16 DIAGNOSIS — E041 Nontoxic single thyroid nodule: Secondary | ICD-10-CM

## 2017-02-16 DIAGNOSIS — E118 Type 2 diabetes mellitus with unspecified complications: Secondary | ICD-10-CM | POA: Diagnosis not present

## 2017-02-16 DIAGNOSIS — R300 Dysuria: Secondary | ICD-10-CM

## 2017-02-16 DIAGNOSIS — R87610 Atypical squamous cells of undetermined significance on cytologic smear of cervix (ASC-US): Secondary | ICD-10-CM | POA: Diagnosis not present

## 2017-02-16 DIAGNOSIS — N939 Abnormal uterine and vaginal bleeding, unspecified: Secondary | ICD-10-CM

## 2017-02-16 LAB — POC MICROSCOPIC URINALYSIS (UMFC): MUCUS RE: ABSENT

## 2017-02-16 LAB — POCT URINALYSIS DIP (MANUAL ENTRY)
BILIRUBIN UA: NEGATIVE mg/dL
Bilirubin, UA: NEGATIVE
Glucose, UA: NEGATIVE mg/dL
Nitrite, UA: NEGATIVE
PROTEIN UA: NEGATIVE mg/dL
SPEC GRAV UA: 1.015 (ref 1.010–1.025)
Urobilinogen, UA: 0.2 E.U./dL
pH, UA: 6 (ref 5.0–8.0)

## 2017-02-16 LAB — POCT WET + KOH PREP
TRICH BY WET PREP: ABSENT
YEAST BY KOH: ABSENT
Yeast by wet prep: ABSENT

## 2017-02-16 MED ORDER — ESTRADIOL 0.1 MG/GM VA CREA
1.0000 | TOPICAL_CREAM | Freq: Every day | VAGINAL | 1 refills | Status: DC
Start: 2017-02-16 — End: 2019-09-30

## 2017-02-16 MED ORDER — CEPHALEXIN 500 MG PO CAPS: 500.0000 mg | ORAL_CAPSULE | Freq: Four times a day (QID) | ORAL | 0 refills | Status: DC

## 2017-02-16 MED ORDER — OMEPRAZOLE 20 MG PO CPDR: 20.0000 mg | DELAYED_RELEASE_CAPSULE | Freq: Every day | ORAL | 3 refills | Status: DC

## 2017-02-16 NOTE — Progress Notes (Signed)
02/19/2017 10:04 AM   DOB: 19-Mar-1950 / MRN: [id number]  SUBJECTIVE:  Beth Stanley is a 67 y.o. female presenting for recheck hypertension.  She is status post stem cell transplant roughly 90 days ago the treatment of B cell lymphoma.  She is thought to be in remission at this time.  She has a history of diabetes, GERD, anxiety and depression.  Her main complaint today is blood in her underwear.  She is supposedly status post hysterectomy and tells me that she does not have a cervix today.  She denies dyspareunia, however states she is not sexually active at this time secondary to her immunocompromise status secondary to status post stem cell transplant about 3 months ago.  She does complain of weight loss, however this is also in the setting of a stem cell transplant.   She has a history of a thyroid nodule seen on CT which was found at the same time of her lymphoma recurrence.  This problem has been lost to follow-up secondary to lymphoma treatment at [facility] a UA on that but I see   Current Outpatient Medications:  .  acyclovir (ZOVIRAX) 800 MG tablet, Take 800 mg by mouth 2 (two) times daily., Disp: , Rfl:  .  ALPRAZolam (XANAX) 0.5 MG tablet, TAKE 1/2-1 TABLET BY MOUTH AS NEEDED FORPANIC, Disp: 30 tablet, Rfl: 1 .  blood glucose meter kit and supplies KIT, Test blood sugar once daily. Dx code: E11.9, Disp: 1 each, Rfl: 0 .  diphenoxylate-atropine (LOMOTIL) 2.5-0.025 MG tablet, Take 1 tablet by mouth 4 (four) times daily as needed for diarrhea or loose stools., Disp: , Rfl:  .  folic acid (FOLVITE) 1 MG tablet, Take 1 mg by mouth daily., Disp: , Rfl:  .  FREESTYLE LITE test strip, TEST BLOOD SUGAR ONCE DAILY, Disp: 25 each, Rfl: 0 .  gabapentin (NEURONTIN) 300 MG capsule, Take 1 tablet each night for peripheral neuropathy (Patient taking differently: Take 1 capsule each night for peripheral neuropathy), Disp: 90 capsule, Rfl: 3 .  Lancets (FREESTYLE) lancets, TEST  BLOOD GLUCOSE ONCE DAILY, Disp: 100 each, Rfl: 12 .  loperamide (IMODIUM) 2 MG capsule, Take 2 mg by mouth as needed for diarrhea or loose stools., Disp: , Rfl:  .  loratadine (CLARITIN) 10 MG tablet, Take 10 mg by mouth daily as needed for allergies., Disp: , Rfl:  .  metFORMIN (GLUCOPHAGE) 500 MG tablet, Take 1,000 mg by mouth 2 (two) times daily with a meal., Disp: , Rfl:  .  Multiple Vitamin (MULTIVITAMIN) tablet, Take 1 tablet by mouth daily., Disp: , Rfl:  .  ondansetron (ZOFRAN) 8 MG tablet, Take by mouth every 8 (eight) hours as needed for nausea or vomiting., Disp: , Rfl:  .  prochlorperazine (COMPAZINE) 10 MG tablet, TAKE 1 TABLET EVERY 6 HOURS AS NEEDED FOR NAUSEA, Disp: 30 tablet, Rfl: 0 .  sertraline (ZOLOFT) 100 MG tablet, TAKE ONE (1) TABLET EACH DAY, Disp: 90 tablet, Rfl: 3 .  cephALEXin (KEFLEX) 500 MG capsule, Take 1 capsule (500 mg total) by mouth 4 (four) times daily., Disp: 21 capsule, Rfl: 0 .  estradiol (ESTRACE) 0.1 MG/GM vaginal cream, Place 1 Applicatorful vaginally at bedtime., Disp: 42.5 g, Rfl: 1 .  omeprazole (PRILOSEC) 20 MG capsule, Take 1 capsule (20 mg total) by mouth daily., Disp: 90 capsule, Rfl: 3   She is allergic to other.   She  has a past medical history of Anxiety, Arthritis, Diabetes (  [place]), Heartburn, HTN (hypertension), and Non Hodgkin's lymphoma ([place]) (2004).    She  reports that she has been smoking.  She has been smoking about 1.00 pack per day. she has never used smokeless tobacco. She reports that she drinks alcohol. She reports that she does not use drugs. She  has no sexual activity history on file. The patient  has a past surgical history that includes Total abdominal hysterectomy.  Her family history includes Diabetes in her mother; Kidney cancer in her brother; Lung cancer in her brother, brother, and sister.  Review of Systems  Constitutional: Negative for chills, diaphoresis and fever.  Eyes: Negative.   Respiratory: Negative for cough,  hemoptysis, sputum production, shortness of breath and wheezing.   Cardiovascular: Negative for chest pain, orthopnea and leg swelling.  Gastrointestinal: Negative for abdominal pain, blood in stool, constipation, diarrhea, heartburn, melena, nausea and vomiting.  Genitourinary: Negative for flank pain.  Skin: Negative for rash.  Neurological: Negative for dizziness, sensory change, speech change, focal weakness and headaches.    The problem list and medications were reviewed and updated by myself where necessary and exist elsewhere in the encounter.   OBJECTIVE:  BP 132/78 (BP Location: Right Arm, Patient Position: Sitting, Cuff Size: Normal)   Pulse 86   Resp 16   Ht 5' 3.78" (1.62 m)   SpO2 97%   BMI 25.22 kg/m   Physical Exam  Constitutional: She is active.  Non-toxic appearance.  Cardiovascular: Normal rate, regular rhythm, S1 normal, S2 normal, normal heart sounds and intact distal pulses. Exam reveals no gallop, no friction rub and no decreased pulses.  No murmur heard. Pulmonary/Chest: Effort normal. No stridor. No tachypnea. No respiratory distress. She has no wheezes. She has no rales.  Abdominal: She exhibits no distension.  Genitourinary:     Genitourinary Comments: Cervix is without lesion and no blood emanating from the cervix or the vagina.  Pap taken at the time of the exam.  Musculoskeletal: She exhibits no edema.  Neurological: She is alert.  Skin: Skin is warm and dry. She is not diaphoretic. No pallor.          Results for orders placed or performed in visit on 02/16/17 (from the past 72 hour(s))  POCT urinalysis dipstick     Status: Abnormal   Collection Time: 02/16/17  4:28 PM  Result Value Ref Range   Color, UA yellow yellow   Clarity, UA clear clear   Glucose, UA negative negative mg/dL   Bilirubin, UA negative negative   Ketones, POC UA negative negative mg/dL   Spec Grav, UA 1.015 1.010 - 1.025   Blood, UA trace-intact (A) negative   pH,  UA 6.0 5.0 - 8.0   Protein Ur, POC negative negative mg/dL   Urobilinogen, UA 0.2 0.2 or 1.0 E.U./dL   Nitrite, UA Negative Negative   Leukocytes, UA Small (1+) (A) Negative  POCT Microscopic Urinalysis ([facility])     Status: Abnormal   Collection Time: 02/16/17  4:29 PM  Result Value Ref Range   WBC,UR,HPF,POC Moderate (A) None WBC/hpf   RBC,UR,HPF,POC Moderate (A) None RBC/hpf   Bacteria Few (A) None, Too numerous to count   Mucus Absent Absent   Epithelial Cells, UR Per Microscopy Few (A) None, Too numerous to count cells/hpf  Lipid Panel     Status: Abnormal   Collection Time: 02/16/17  5:03 PM  Result Value Ref Range   Cholesterol, Total 142 100 - 199 mg/dL   Triglycerides 183 (  H) 0 - 149 mg/dL   HDL 42 >39 mg/dL   VLDL Cholesterol Cal 37 5 - 40 mg/dL   LDL Calculated 63 0 - 99 mg/dL   Chol/HDL Ratio 3.4 0.0 - 4.4 ratio    Comment:                                   T. Chol/HDL Ratio                                             Men  Women                               1/2 Avg.Risk  3.4    3.3                                   Avg.Risk  5.0    4.4                                2X Avg.Risk  9.6    7.1                                3X Avg.Risk 23.4   11.0   TSH     Status: None   Collection Time: 02/16/17  5:03 PM  Result Value Ref Range   TSH 1.990 0.450 - 4.500 uIU/mL  Microalbumin, urine     Status: None   Collection Time: 02/16/17  5:45 PM  Result Value Ref Range   Microalbumin, Urine 15.6 Not Estab. ug/mL  POCT Wet + KOH Prep     Status: Abnormal   Collection Time: 02/16/17  6:47 PM  Result Value Ref Range   Yeast by KOH Absent Absent   Yeast by wet prep Absent Absent   WBC by wet prep None (A) Few   Clue Cells Wet Prep HPF POC None None   Trich by wet prep Absent Absent   Bacteria Wet Prep HPF POC Few Few   Epithelial Cells By [employer] Pref ([facility]) Few None, Few, Too numerous to count   RBC,UR,HPF,POC None None RBC/hpf    No results found.  ASSESSMENT AND  PLAN:  Conita was seen today for abdominal pain, vaginal bleeding and dysuria.  Diagnoses and all orders for this visit:  Dysuria: It does appear that her urine is infected.  Starting Keflex.  Patient will come back in 2 weeks for recheck.  This is potentially a cause of her hematuria. -     POCT Microscopic Urinalysis ([facility]) -     Cancel: POCT urinalysis dipstick -     POCT urinalysis dipstick -     Urine Culture       -     cephALEXin (KEFLEX) 500 MG capsule; Take 1 capsule (500 mg total) by mouth 4 (four)   Controlled type 2 diabetes mellitus with complication, without long-term current use of insulin ([place]): A1c is just above 7.  We will review this result with her at her next visit. -     Lipid  Panel -     Microalbumin, urine -     POCT urinalysis dipstick  Thyroid nodule: Sometime has passed since the CT scan whichf ound her lymphoma recurrence.  We will check a US thyroid for clarification of the  of the mass. -     US Soft Tissue Head/Neck; Future -     TSH  Vaginal bleeding: The bleeding appears to be coming from her urethral orifice.  We will try her on some Estrace as it does appear she has some vaginal atrophy which could be driving vaginal discomfort.  We will check this back on exam in 2 weeks. -     Cancel: Pap IG w/ reflex to HPV when ASC-U -     POCT Wet + KOH Prep -     Pap IG w/ reflex to HPV when ASC-U   -     estradiol (ESTRACE) 0.1 MG/GM vaginal cream; Place 1 Applicatorful vaginally at bedtime. -     omeprazole (PRILOSEC) 20 MG capsule; Take 1 capsule (20 mg total) by mouth daily. -     TSH    The patient is advised to call or return to clinic if she does not see an improvement in symptoms, or to seek the care of the closest emergency department if she worsens with the above plan.   [name], MHS, PA-C [facility] 02/19/2017 10:04 AM

## 2017-02-16 NOTE — Patient Instructions (Addendum)
  See you in a month.  Ill call once I get the results of the thyroid image.    IF you received an x-ray today, you will receive an invoice from [facility]. Please contact [facility] at [phone] with questions or concerns regarding your invoice.   IF you received labwork today, you will receive an invoice from [facility]. Please contact [facility] at [phone] with questions or concerns regarding your invoice.   Our billing staff will not be able to assist you with questions regarding bills from these companies.  You will be contacted with the lab results as soon as they are available. The fastest way to get your results is to activate your My Chart account. Instructions are located on the last page of this paperwork. If you have not heard from [place] regarding the results in 2 weeks, please contact this office.

## 2017-02-17 ENCOUNTER — Telehealth: Payer: Self-pay | Admitting: Physician Assistant

## 2017-02-17 LAB — TSH: TSH: 1.99 u[IU]/mL (ref 0.450–4.500)

## 2017-02-17 LAB — MICROALBUMIN, URINE: Microalbumin, Urine: 15.6 ug/mL

## 2017-02-17 LAB — LIPID PANEL
CHOLESTEROL TOTAL: 142 mg/dL (ref 100–199)
Chol/HDL Ratio: 3.4 ratio (ref 0.0–4.4)
HDL: 42 mg/dL (ref >39)
LDL Calculated: 63 mg/dL (ref 0–99)
TRIGLYCERIDES: 183 mg/dL — AB (ref 0–149)
VLDL Cholesterol Cal: 37 mg/dL (ref 5–40)

## 2017-02-17 NOTE — Telephone Encounter (Signed)
Copied from [place] [phone]. Topic: Quick Communication - See Telephone Encounter >> Feb 17, 2017 10:34 AM Burnis Medin, NT wrote: CRM for notification. See Telephone encounter for: Patient husband is calling because he said his wife was in  the office and is losing more weight than expected. Husband said she had a stem transplant and is concerned and would like a call back.   02/17/17.

## 2017-02-20 LAB — PAP IG W/ RFLX HPV ASCU: PAP SMEAR COMMENT: 0

## 2017-02-20 LAB — HPV DNA PROBE HIGH RISK, AMPLIFIED: HPV, HIGH-RISK: NEGATIVE

## 2017-02-24 ENCOUNTER — Telehealth: Payer: Self-pay | Admitting: Physician Assistant

## 2017-02-24 NOTE — Telephone Encounter (Signed)
Copied from [place] [id number]. Topic: Quick Communication - Lab Results >> Feb 24, 2017 10:14 AM Darl Householder, RMA wrote: Patient is requesting lab results please return call

## 2017-02-25 NOTE — Telephone Encounter (Signed)
Ok to release

## 2017-02-27 ENCOUNTER — Other Ambulatory Visit: Payer: Self-pay | Admitting: Physician Assistant

## 2017-02-27 DIAGNOSIS — R87610 Atypical squamous cells of undetermined significance on cytologic smear of cervix (ASC-US): Secondary | ICD-10-CM

## 2017-02-28 ENCOUNTER — Other Ambulatory Visit: Payer: Self-pay | Admitting: Physician Assistant

## 2017-02-28 DIAGNOSIS — E041 Nontoxic single thyroid nodule: Secondary | ICD-10-CM

## 2017-03-02 ENCOUNTER — Encounter: Payer: Self-pay | Admitting: Family Medicine

## 2017-03-02 ENCOUNTER — Ambulatory Visit (INDEPENDENT_AMBULATORY_CARE_PROVIDER_SITE_OTHER): Payer: PPO | Admitting: Family Medicine

## 2017-03-02 ENCOUNTER — Telehealth: Payer: Self-pay | Admitting: Physician Assistant

## 2017-03-02 ENCOUNTER — Telehealth: Payer: Self-pay | Admitting: Radiology

## 2017-03-02 VITALS — Ht 63.0 in | Wt 125.0 lb

## 2017-03-02 DIAGNOSIS — R1032 Left lower quadrant pain: Secondary | ICD-10-CM | POA: Diagnosis not present

## 2017-03-02 DIAGNOSIS — R87610 Atypical squamous cells of undetermined significance on cytologic smear of cervix (ASC-US): Secondary | ICD-10-CM | POA: Diagnosis not present

## 2017-03-02 DIAGNOSIS — N368 Other specified disorders of urethra: Secondary | ICD-10-CM

## 2017-03-02 NOTE — Progress Notes (Signed)
   Subjective:    Patient ID: Beth Stanley, female    DOB: 04-24-1950, 67 y.o.   MRN: [id number]  HPI 67 year old female referred to our office due to abnormal Pap smear and bleeding.  Patient is status post abdominal hysterectomy sparing ovaries and cervix.  Hysterectomy performed 30 years ago due to bleeding.  Has had no history of abnormal Pap smears until her last Pap earlier this month.  Additionally, the patient has had some bloody discharge/spotting.  Her PCP evaluated her and discovered minimal bleeding from her urethral orifice.  She was placed on estradiol cream, which has helped improve the bleeding.  She also admits to pain in her left lower abdomen.  She believes that this is GYN related.  She was previously a patient at [facility] and was evaluated by nurse practitioner there.  She had an ultrasound that was unable to visualize her ovaries.  I have reviewed the patients past medical, family, and social history.  I have reviewed the patient's medication list and allergies.   Review of Systems  All other systems reviewed and are negative.      Objective:   Physical Exam  Constitutional: She appears well-developed and well-nourished.  Abdominal: Soft. Bowel sounds are normal. She exhibits no distension. There is tenderness (LLQ). There is no rebound and no guarding. Hernia confirmed negative in the right inguinal area and confirmed negative in the left inguinal area.  Genitourinary:    There is no rash, tenderness, lesion or injury on the right labia. There is no rash, tenderness, lesion or injury on the left labia. Cervix exhibits no motion tenderness, no discharge and no friability. Right adnexum displays no mass, no tenderness and no fullness. Left adnexum displays no mass, no tenderness and no fullness. No erythema, tenderness or bleeding in the vagina. No foreign body in the vagina. No signs of injury around the vagina. No vaginal discharge found.  Genitourinary  Comments: Uterus absent  Lymphadenopathy:       Right: No inguinal adenopathy present.       Left: No inguinal adenopathy present.       Assessment & Plan:  1. ASCUS of cervix with negative high risk HPV Per ASCCP guidelines, no colposcopy needed. Recommendation would be to repeat PAP in 3 years. I discussed this with patient - will follow up in 1 year for annual GYN exam.  2. Urethral bleeding It appears like she may have a small urethral polyp, which could be the source of her bleeding. I recommended a referral to urology to evaluate  3. LLQ abdominal pain Does not appear gyn related. May need referral to GI.

## 2017-03-02 NOTE — Telephone Encounter (Signed)
Spoke with pt and advised her of appt scheduled for Thyroid U/S at [place] location on 03/04/17 at 1:45pm with arrival time of 1:30pm. Also advised pt that we are working on getting her in with [facility] [employer] [facility]. I contacted their office to schedule, but had to leave a message. I asked that they either call me or pt to get her scheduled. I gave pt their phone number as well so she can try to contact them, and advised her that I would try them again as well. Referral has been sent to them via Epic, just waiting for their office to get her scheduled.

## 2017-03-03 ENCOUNTER — Encounter: Payer: PPO | Admitting: Obstetrics and Gynecology

## 2017-03-04 ENCOUNTER — Ambulatory Visit
Admission: RE | Admit: 2017-03-04 | Discharge: 2017-03-04 | Disposition: A | Discharge status: Home / Self Care | Payer: PPO | Source: Ambulatory Visit | Attending: Physician Assistant | Admitting: Physician Assistant

## 2017-03-04 DIAGNOSIS — E041 Nontoxic single thyroid nodule: Secondary | ICD-10-CM

## 2017-03-04 DIAGNOSIS — E042 Nontoxic multinodular goiter: Secondary | ICD-10-CM | POA: Diagnosis not present

## 2017-03-09 ENCOUNTER — Other Ambulatory Visit: Payer: PPO

## 2017-03-10 ENCOUNTER — Telehealth: Payer: Self-pay | Admitting: Physician Assistant

## 2017-03-10 NOTE — Telephone Encounter (Signed)
Copied from [place] [phone]. Topic: Inquiry >> Mar 10, 2017  3:08 PM Conception Chancy, NT wrote: Opal Sidles is calling from [facility] and is requesting the results from the pts pap smear faxed to her.   Fax [phone] Cb# [phone]

## 2017-03-11 DIAGNOSIS — R319 Hematuria, unspecified: Secondary | ICD-10-CM | POA: Diagnosis not present

## 2017-03-11 DIAGNOSIS — Z01419 Encounter for gynecological examination (general) (routine) without abnormal findings: Secondary | ICD-10-CM | POA: Diagnosis not present

## 2017-03-11 DIAGNOSIS — Z780 Asymptomatic menopausal state: Secondary | ICD-10-CM | POA: Diagnosis not present

## 2017-03-11 DIAGNOSIS — Z1389 Encounter for screening for other disorder: Secondary | ICD-10-CM | POA: Diagnosis not present

## 2017-03-11 DIAGNOSIS — N362 Urethral caruncle: Secondary | ICD-10-CM | POA: Diagnosis not present

## 2017-03-11 DIAGNOSIS — Z6822 Body mass index (BMI) 22.0-22.9, adult: Secondary | ICD-10-CM | POA: Diagnosis not present

## 2017-03-11 DIAGNOSIS — F419 Anxiety disorder, unspecified: Secondary | ICD-10-CM | POA: Diagnosis not present

## 2017-03-11 DIAGNOSIS — Z130 Encounter for screening for diseases of the blood and blood-forming organs and certain disorders involving the immune mechanism: Secondary | ICD-10-CM | POA: Diagnosis not present

## 2017-03-14 NOTE — Telephone Encounter (Signed)
We will need a ROI from [facility] before we can release this info. [email or web address]

## 2017-03-16 ENCOUNTER — Ambulatory Visit: Payer: PPO | Admitting: Physician Assistant

## 2017-03-17 NOTE — Telephone Encounter (Signed)
Does this belong to you guys?

## 2017-03-31 DIAGNOSIS — N39498 Other specified urinary incontinence: Secondary | ICD-10-CM | POA: Diagnosis not present

## 2017-03-31 DIAGNOSIS — R309 Painful micturition, unspecified: Secondary | ICD-10-CM | POA: Diagnosis not present

## 2017-03-31 DIAGNOSIS — Z1389 Encounter for screening for other disorder: Secondary | ICD-10-CM | POA: Diagnosis not present

## 2017-03-31 DIAGNOSIS — N952 Postmenopausal atrophic vaginitis: Secondary | ICD-10-CM | POA: Diagnosis not present

## 2017-03-31 DIAGNOSIS — Z0000 Encounter for general adult medical examination without abnormal findings: Secondary | ICD-10-CM | POA: Diagnosis not present

## 2017-04-23 DIAGNOSIS — Z23 Encounter for immunization: Secondary | ICD-10-CM | POA: Diagnosis not present

## 2017-04-23 DIAGNOSIS — C8298 Follicular lymphoma, unspecified, lymph nodes of multiple sites: Secondary | ICD-10-CM | POA: Diagnosis not present

## 2017-04-23 DIAGNOSIS — E041 Nontoxic single thyroid nodule: Secondary | ICD-10-CM | POA: Diagnosis not present

## 2017-04-23 DIAGNOSIS — C8290 Follicular lymphoma, unspecified, unspecified site: Secondary | ICD-10-CM | POA: Diagnosis not present

## 2017-04-23 DIAGNOSIS — C8330 Diffuse large B-cell lymphoma, unspecified site: Secondary | ICD-10-CM | POA: Diagnosis not present

## 2017-04-23 DIAGNOSIS — R600 Localized edema: Secondary | ICD-10-CM | POA: Diagnosis not present

## 2017-04-23 DIAGNOSIS — C8510 Unspecified B-cell lymphoma, unspecified site: Secondary | ICD-10-CM | POA: Diagnosis not present

## 2017-04-23 DIAGNOSIS — K828 Other specified diseases of gallbladder: Secondary | ICD-10-CM | POA: Diagnosis not present

## 2017-04-23 DIAGNOSIS — Z91040 Latex allergy status: Secondary | ICD-10-CM | POA: Diagnosis not present

## 2017-04-23 DIAGNOSIS — Z9484 Stem cells transplant status: Secondary | ICD-10-CM | POA: Diagnosis not present

## 2017-04-23 DIAGNOSIS — Z8742 Personal history of other diseases of the female genital tract: Secondary | ICD-10-CM | POA: Diagnosis not present

## 2017-04-23 DIAGNOSIS — Z886 Allergy status to analgesic agent status: Secondary | ICD-10-CM | POA: Diagnosis not present

## 2017-05-05 ENCOUNTER — Ambulatory Visit: Payer: Self-pay

## 2017-05-05 DIAGNOSIS — T7840XA Allergy, unspecified, initial encounter: Secondary | ICD-10-CM | POA: Diagnosis not present

## 2017-05-05 DIAGNOSIS — R0602 Shortness of breath: Secondary | ICD-10-CM | POA: Diagnosis not present

## 2017-05-05 DIAGNOSIS — J45901 Unspecified asthma with (acute) exacerbation: Secondary | ICD-10-CM | POA: Diagnosis not present

## 2017-05-05 DIAGNOSIS — R05 Cough: Secondary | ICD-10-CM | POA: Diagnosis not present

## 2017-05-05 DIAGNOSIS — R062 Wheezing: Secondary | ICD-10-CM | POA: Diagnosis not present

## 2017-05-05 DIAGNOSIS — C8588 Other specified types of non-Hodgkin lymphoma, lymph nodes of multiple sites: Secondary | ICD-10-CM | POA: Diagnosis not present

## 2017-05-05 NOTE — Telephone Encounter (Signed)
Husband reports cough started Friday. Yellow sputum. States "cough is a little better, but she was short of breath all night." Reports her breathing "is regular now, she's taking a nap." States she is weak. Has a poor appetite. Had stem cell transplant last October. Appointment made for tomorrow - no availability today.Instructed husband if breathing difficulty returns to call 911 or go to ED. Verbalizes understanding.  Reason for Disposition . [1] Continuous (nonstop) coughing interferes with work or school AND [2] no improvement using cough treatment per Care Advice  Answer Assessment - Initial Assessment Questions 1. ONSET: "When did the cough begin?"      Started Friday 2. SEVERITY: "How bad is the cough today?"      It's better 3. RESPIRATORY DISTRESS: "Describe your breathing."      Shortness of breath  4. FEVER: "Do you have a fever?" If so, ask: "What is your temperature, how was it measured, and when did it start?"     No 5. SPUTUM: "Describe the color of your sputum" (clear, white, yellow, green)     Yellow 6. HEMOPTYSIS: "Are you coughing up any blood?" If so ask: "How much?" (flecks, streaks, tablespoons, etc.)     No 7. CARDIAC HISTORY: "Do you have any history of heart disease?" (e.g., heart attack, congestive heart failure)      No 8. LUNG HISTORY: "Do you have any history of lung disease?"  (e.g., pulmonary embolus, asthma, emphysema)     No 9. PE RISK FACTORS: "Do you have a history of blood clots?" (or: recent major surgery, recent prolonged travel, bedridden )     No 10. OTHER SYMPTOMS: "Do you have any other symptoms?" (e.g., runny nose, wheezing, chest pain)       Runny nose 11. PREGNANCY: "Is there any chance you are pregnant?" "When was your last menstrual period?"       No 12. TRAVEL: "Have you traveled out of the country in the last month?" (e.g., travel history, exposures)       No  Protocols used: [place]

## 2017-05-06 ENCOUNTER — Ambulatory Visit: Payer: Self-pay | Admitting: Physician Assistant

## 2017-05-08 DIAGNOSIS — J45901 Unspecified asthma with (acute) exacerbation: Secondary | ICD-10-CM | POA: Diagnosis not present

## 2017-05-14 ENCOUNTER — Other Ambulatory Visit
Admission: RE | Admit: 2017-05-14 | Discharge: 2017-05-14 | Disposition: A | Discharge status: Home / Self Care | Payer: PPO | Source: Ambulatory Visit | Attending: Family | Admitting: Family

## 2017-05-14 DIAGNOSIS — J40 Bronchitis, not specified as acute or chronic: Secondary | ICD-10-CM | POA: Diagnosis not present

## 2017-05-14 DIAGNOSIS — R252 Cramp and spasm: Secondary | ICD-10-CM | POA: Diagnosis not present

## 2017-05-14 LAB — CBC
HCT: 36.9 % (ref 35.0–47.0)
HEMOGLOBIN: 12.5 g/dL (ref 12.0–16.0)
MCH: 30.5 pg (ref 26.0–34.0)
MCHC: 33.9 g/dL (ref 32.0–36.0)
MCV: 90 fL (ref 80.0–100.0)
Platelets: 212 10*3/uL (ref 150–440)
RBC: 4.1 MIL/uL (ref 3.80–5.20)
RDW: 16.1 % — ABNORMAL HIGH (ref 11.5–14.5)
WBC: 9.4 10*3/uL (ref 3.6–11.0)

## 2017-05-14 LAB — COMPREHENSIVE METABOLIC PANEL
ALK PHOS: 92 U/L (ref 38–126)
ALT: 21 U/L (ref 14–54)
AST: 20 U/L (ref 15–41)
Albumin: 4 g/dL (ref 3.5–5.0)
Anion gap: 9 (ref 5–15)
BUN: 21 mg/dL — AB (ref 6–20)
CALCIUM: 8.9 mg/dL (ref 8.9–10.3)
CO2: 26 mmol/L (ref 22–32)
CREATININE: 0.73 mg/dL (ref 0.44–1.00)
Chloride: 105 mmol/L (ref 101–111)
GFR calc non Af Amer: >60 mL/min (ref >60)
GLUCOSE: 126 mg/dL — AB (ref 65–99)
Potassium: 4.2 mmol/L (ref 3.5–5.1)
Sodium: 140 mmol/L (ref 135–145)
Total Bilirubin: 0.2 mg/dL — ABNORMAL LOW (ref 0.3–1.2)
Total Protein: 7.1 g/dL (ref 6.5–8.1)

## 2017-05-14 LAB — MAGNESIUM: Magnesium: 1.7 mg/dL (ref 1.7–2.4)

## 2017-05-25 ENCOUNTER — Other Ambulatory Visit: Payer: Self-pay | Admitting: Physician Assistant

## 2017-05-25 DIAGNOSIS — R112 Nausea with vomiting, unspecified: Secondary | ICD-10-CM

## 2017-05-25 NOTE — Telephone Encounter (Signed)
LOV 02/16/17 but I do not see Compazine listed as discussed /  Disp Refills Start End   prochlorperazine (COMPAZINE) 10 MG tablet 30 tablet 0 02/06/2017    Sig: TAKE 1 TABLET EVERY 6 HOURS AS NEEDED FOR NAUSEA    / Please refill if request is appropriate /

## 2017-06-02 DIAGNOSIS — E119 Type 2 diabetes mellitus without complications: Secondary | ICD-10-CM | POA: Diagnosis not present

## 2017-06-02 DIAGNOSIS — H25019 Cortical age-related cataract, unspecified eye: Secondary | ICD-10-CM | POA: Diagnosis not present

## 2017-06-02 LAB — HM DIABETES EYE EXAM

## 2017-06-04 ENCOUNTER — Ambulatory Visit: Payer: PPO

## 2017-06-07 DIAGNOSIS — J45901 Unspecified asthma with (acute) exacerbation: Secondary | ICD-10-CM | POA: Diagnosis not present

## 2017-06-10 ENCOUNTER — Encounter: Payer: Self-pay | Admitting: Physician Assistant

## 2017-06-10 ENCOUNTER — Ambulatory Visit (INDEPENDENT_AMBULATORY_CARE_PROVIDER_SITE_OTHER): Payer: PPO | Admitting: Physician Assistant

## 2017-06-10 VITALS — BP 116/60 | HR 87 | Temp 98.1°F | Ht 63.0 in | Wt 127.0 lb

## 2017-06-10 DIAGNOSIS — F411 Generalized anxiety disorder: Secondary | ICD-10-CM

## 2017-06-10 DIAGNOSIS — R634 Abnormal weight loss: Secondary | ICD-10-CM | POA: Diagnosis not present

## 2017-06-10 DIAGNOSIS — E118 Type 2 diabetes mellitus with unspecified complications: Secondary | ICD-10-CM

## 2017-06-10 DIAGNOSIS — F329 Major depressive disorder, single episode, unspecified: Secondary | ICD-10-CM | POA: Diagnosis not present

## 2017-06-10 DIAGNOSIS — F410 Panic disorder [episodic paroxysmal anxiety] without agoraphobia: Secondary | ICD-10-CM

## 2017-06-10 DIAGNOSIS — F32A Depression, unspecified: Secondary | ICD-10-CM

## 2017-06-10 MED ORDER — ONDANSETRON HCL 8 MG PO TABS: 4.0000 mg | ORAL_TABLET | Freq: Three times a day (TID) | ORAL | 3 refills | Status: DC | PRN

## 2017-06-10 MED ORDER — ALPRAZOLAM 0.5 MG PO TABS
ORAL_TABLET | ORAL | 1 refills | Status: DC
Start: 2017-06-10 — End: 2017-11-05

## 2017-06-10 MED ORDER — SERTRALINE HCL 100 MG PO TABS: ORAL_TABLET | ORAL | 3 refills | Status: DC

## 2017-06-10 MED ORDER — LOPERAMIDE HCL 2 MG PO CAPS: 2.0000 mg | ORAL_CAPSULE | Freq: Two times a day (BID) | ORAL | 3 refills | Status: DC

## 2017-06-10 NOTE — Progress Notes (Signed)
06/10/2017 4:22 PM   DOB: July 04, 1950 / MRN: [id number]  SUBJECTIVE:  Beth Stanley is a 67 y.o. female presenting for weight loss concerns.  She is status post bone marrow transplant roughly 1 year ago.  She is followed by [facility] for this and is thought to be doing well.  She feels well today.  Her weight is stable since last time she was here and actually up 2 pounds.  She continues to smoke.  She is allergic to other and tape.   She  has a past medical history of Anxiety, Arthritis, Diabetes ([place]), Heartburn, HTN (hypertension), and Non Hodgkin's lymphoma ([place]) (2004).    She  reports that she has been smoking.  She has been smoking about 1.00 pack per day. She has never used smokeless tobacco. She reports that she drinks alcohol. She reports that she does not use drugs. She  has no sexual activity history on file. The patient  has a past surgical history that includes Total abdominal hysterectomy.  Her family history includes Diabetes in her mother; Kidney cancer in her brother; Lung cancer in her brother, brother, and sister.  Review of Systems  Constitutional: Negative for chills, diaphoresis and fever.  Eyes: Negative.   Respiratory: Negative for cough, hemoptysis, sputum production, shortness of breath and wheezing.   Cardiovascular: Negative for chest pain, orthopnea and leg swelling.  Gastrointestinal: Negative for abdominal pain, blood in stool, constipation, diarrhea, heartburn, melena, nausea and vomiting.  Genitourinary: Negative for dysuria, flank pain, frequency, hematuria and urgency.  Skin: Negative for rash.  Neurological: Negative for dizziness, sensory change, speech change, focal weakness and headaches.    The problem list and medications were reviewed and updated by myself where necessary and exist elsewhere in the encounter.   OBJECTIVE:  BP 116/60 (BP Location: Right Arm, Patient Position: Sitting, Cuff Size: Normal)   Pulse 87   Temp 98.1 F (36.7  C) (Oral)   Ht 5\' 3"  (1.6 m)   Wt 127 lb (57.6 kg)   SpO2 99%   BMI 22.50 kg/m   Wt Readings from Last 3 Encounters:  06/10/17 127 lb (57.6 kg)  03/02/17 125 lb (56.7 kg)  06/05/16 145 lb 14.4 oz (66.2 kg)      Physical Exam  Constitutional: She is oriented to person, place, and time. She appears well-nourished. She is active.  Non-toxic appearance. No distress.  HENT:  Right Ear: Hearing, tympanic membrane, external ear and ear canal normal.  Left Ear: Hearing, tympanic membrane, external ear and ear canal normal.  Nose: Nose normal. Right sinus exhibits no maxillary sinus tenderness and no frontal sinus tenderness. Left sinus exhibits no maxillary sinus tenderness and no frontal sinus tenderness.  Mouth/Throat: Uvula is midline, oropharynx is clear and moist and mucous membranes are normal. Mucous membranes are not dry. No oropharyngeal exudate, posterior oropharyngeal edema or tonsillar abscesses.  Eyes: Pupils are equal, round, and reactive to light. EOM are normal.  Cardiovascular: Normal rate, regular rhythm, S1 normal, S2 normal, normal heart sounds and intact distal pulses. Exam reveals no gallop, no friction rub and no decreased pulses.  No murmur heard. Pulmonary/Chest: Effort normal. No stridor. No tachypnea. No respiratory distress. She has no wheezes. She has no rales.  Abdominal: Soft. Normal appearance and bowel sounds are normal. She exhibits no distension and no mass. There is no tenderness. There is no rigidity, no rebound, no guarding and no CVA tenderness.  Musculoskeletal: She exhibits no edema.  Lymphadenopathy:  Head (right side): No submandibular and no tonsillar adenopathy present.       Head (left side): No submandibular and no tonsillar adenopathy present.    She has no cervical adenopathy.  Neurological: She is alert and oriented to person, place, and time. She has normal strength and normal reflexes. She is not disoriented. She displays no atrophy.  No cranial nerve deficit or sensory deficit. She exhibits normal muscle tone. Coordination and gait normal.  Skin: Skin is warm and dry. She is not diaphoretic. No pallor.  Psychiatric: She has a normal mood and affect. Her behavior is normal.  Vitals reviewed.   Lab Results  Component Value Date   WBC 9.4 05/14/2017   HGB 12.5 05/14/2017   HCT 36.9 05/14/2017   MCV 90.0 05/14/2017   PLT 212 05/14/2017    Lab Results  Component Value Date   CREATININE 0.73 05/14/2017   BUN 21 (H) 05/14/2017   NA 140 05/14/2017   K 4.2 05/14/2017   CL 105 05/14/2017   CO2 26 05/14/2017    Lab Results  Component Value Date   ALT 21 05/14/2017   AST 20 05/14/2017   ALKPHOS 92 05/14/2017   BILITOT 0.2 (L) 05/14/2017    Lab Results  Component Value Date   TSH 1.990 02/16/2017    Lab Results  Component Value Date   HGBA1C 7.2 04/14/2016    Lab Results  Component Value Date   CHOL 142 02/16/2017   HDL 42 02/16/2017   LDLCALC 63 02/16/2017   TRIG 183 (H) 02/16/2017   CHOLHDL 3.4 02/16/2017     No results found for this or any previous visit (from the past 72 hour(s)).  No results found.  ASSESSMENT AND PLAN:  Amazing was seen today for thyroid problem.  Diagnoses and all orders for this visit:  Loss of weight: Her thyroid has been imaged twice in the last year.  TSH and T4 have been normal.  Per my last ultrasound scan there was no indication for biopsy.  We can certainly repeat this in time.  I think her loss of weight is secondary to bone marrow transplant.  Her weight is stable and she is eating well. -     TSH -     T4, Free  Controlled type 2 diabetes mellitus with complication, without long-term current use of insulin ([facility]) -     Hemoglobin A1c -     COMPLETE METABOLIC PANEL WITH GFR  Smoker: Encouraged her to quit.  Anxiety with panic: Xanax refilled today.  She uses this responsibly.  The patient is advised to call or return to clinic if she does not see an  improvement in symptoms, or to seek the care of the closest emergency department if she worsens with the above plan.   [name], MHS, PA-C [facility] 06/10/2017 4:22 PM

## 2017-06-10 NOTE — Patient Instructions (Addendum)
  Let me know if you would like to see the endocrinologist.  I'll be in touch with lab work.    IF you received an x-ray today, you will receive an invoice from [facility]. Please contact [facility] at [phone] with questions or concerns regarding your invoice.   IF you received labwork today, you will receive an invoice from [facility]. Please contact [facility] at [phone] with questions or concerns regarding your invoice.   Our billing staff will not be able to assist you with questions regarding bills from these companies.  You will be contacted with the lab results as soon as they are available. The fastest way to get your results is to activate your My Chart account. Instructions are located on the last page of this paperwork. If you have not heard from [place] regarding the results in 2 weeks, please contact this office.

## 2017-06-11 ENCOUNTER — Telehealth: Payer: Self-pay | Admitting: Physician Assistant

## 2017-06-11 LAB — TSH: TSH: 2.54 u[IU]/mL (ref 0.450–4.500)

## 2017-06-11 LAB — HEMOGLOBIN A1C
Est. average glucose Bld gHb Est-mCnc: 117 mg/dL
Hgb A1c MFr Bld: 5.7 % — ABNORMAL HIGH (ref 4.8–5.6)

## 2017-06-11 LAB — T4, FREE: FREE T4: 1.05 ng/dL (ref 0.82–1.77)

## 2017-06-11 NOTE — Telephone Encounter (Signed)
Copied from [place] [phone]. Topic: Quick Communication - See Telephone Encounter >> Jun 11, 2017  4:15 PM Bea Graff, NT wrote: CRM for notification. See Telephone encounter for: 06/11/17. Pt husband would like a call with his wifes lab results.

## 2017-06-15 NOTE — Telephone Encounter (Signed)
Attempted to call. No answer. VM not set up. Crm done.

## 2017-06-29 DIAGNOSIS — J45901 Unspecified asthma with (acute) exacerbation: Secondary | ICD-10-CM | POA: Diagnosis not present

## 2017-07-07 DIAGNOSIS — N952 Postmenopausal atrophic vaginitis: Secondary | ICD-10-CM | POA: Diagnosis not present

## 2017-07-08 DIAGNOSIS — J45901 Unspecified asthma with (acute) exacerbation: Secondary | ICD-10-CM | POA: Diagnosis not present

## 2017-07-11 ENCOUNTER — Other Ambulatory Visit: Payer: Self-pay | Admitting: Physician Assistant

## 2017-07-11 DIAGNOSIS — R112 Nausea with vomiting, unspecified: Secondary | ICD-10-CM

## 2017-07-13 ENCOUNTER — Other Ambulatory Visit: Payer: Self-pay | Admitting: Physician Assistant

## 2017-07-13 DIAGNOSIS — R112 Nausea with vomiting, unspecified: Secondary | ICD-10-CM

## 2017-07-13 NOTE — Telephone Encounter (Signed)
Pt husband Danylah Holden [phone] calling wanting to know why pt was denied the prochlorperazine (COMPAZINE) 10 MG tablet she  has been on this medicine for 16 years he states that the Zofran doesn't work for wife please give him a call

## 2017-07-13 NOTE — Telephone Encounter (Signed)
Copied from [place] [phone]. Topic: Quick Communication - Rx Refill/Question >> Jul 13, 2017 10:48 AM Waylan Rocher, Lumin L wrote: Medication: prochlorperazine (COMPAZINE) 10 MG tablet   Has the patient contacted their pharmacy? Yes.   (Agent: If no, request that the patient contact the pharmacy for the refill.) (Agent: If yes, when and what did the pharmacy advise?)  Preferred Pharmacy (with phone number or street name): [facility] [place], [address] [place] [address] Phone: [phone] Fax: [phone]   Agent: Please be advised that RX refills may take up to 3 business days. We ask that you follow-up with your pharmacy.

## 2017-07-14 ENCOUNTER — Other Ambulatory Visit: Payer: Self-pay | Admitting: Physician Assistant

## 2017-07-14 DIAGNOSIS — R112 Nausea with vomiting, unspecified: Secondary | ICD-10-CM

## 2017-07-14 NOTE — Telephone Encounter (Signed)
Medication refusal sent to [name] - Compazine.

## 2017-07-14 NOTE — Telephone Encounter (Signed)
Copied from [place] [phone]. Topic: Quick Communication - Rx Refill/Question >> Jul 13, 2017 10:48 AM Waylan Rocher, Lumin L wrote: Medication: prochlorperazine (COMPAZINE) 10 MG tablet   Has the patient contacted their pharmacy? Yes.   (Agent: If no, request that the patient contact the pharmacy for the refill.) (Agent: If yes, when and what did the pharmacy advise?)  Preferred Pharmacy (with phone number or street name): [facility] [place], [place] [address] Phone: [phone] Fax: [phone]    Agent: Please be advised that RX refills may take up to 3 business days. We ask that you follow-up with your pharmacy.

## 2017-07-14 NOTE — Telephone Encounter (Signed)
Refill of compazine  LRF 02/06/17  #30  0 refills  LOV 06/10/17  [facility] [place], [place] [address] [place] [address] Phone: [phone] Fax: [phone]

## 2017-07-15 MED ORDER — PROCHLORPERAZINE MALEATE 10 MG PO TABS: 10.0000 mg | ORAL_TABLET | Freq: Three times a day (TID) | ORAL | 6 refills | Status: DC | PRN

## 2017-07-15 NOTE — Telephone Encounter (Signed)
Please refill patients meds at current dose and frequency. 30 tabs 6 refills. Please call and advise that we are sorry for the confusion. There is no reason for her to not have this refilled.

## 2017-07-15 NOTE — Telephone Encounter (Signed)
Patient is requesting a refill of the following medications: Requested Prescriptions   Pending Prescriptions Disp Refills  . prochlorperazine (COMPAZINE) 10 MG tablet 30 tablet 0    Date of patient request: 07/15/17 Last office visit: 06/10/17 Date of last refill: 02/06/17 Last refill amount: #30 0RF  Follow up time period per chart: none scheduled at this time

## 2017-07-16 NOTE — Telephone Encounter (Signed)
Spoke to pt's husband. He states [facility] had called him.  Asked to send [name] thanks and hello to him and his family!

## 2017-07-23 DIAGNOSIS — Z8572 Personal history of non-Hodgkin lymphomas: Secondary | ICD-10-CM | POA: Diagnosis not present

## 2017-07-23 DIAGNOSIS — C8330 Diffuse large B-cell lymphoma, unspecified site: Secondary | ICD-10-CM | POA: Diagnosis not present

## 2017-07-23 DIAGNOSIS — Z48290 Encounter for aftercare following bone marrow transplant: Secondary | ICD-10-CM | POA: Diagnosis not present

## 2017-07-23 DIAGNOSIS — Z23 Encounter for immunization: Secondary | ICD-10-CM | POA: Diagnosis not present

## 2017-07-23 DIAGNOSIS — R2689 Other abnormalities of gait and mobility: Secondary | ICD-10-CM | POA: Diagnosis not present

## 2017-07-23 DIAGNOSIS — B009 Herpesviral infection, unspecified: Secondary | ICD-10-CM | POA: Diagnosis not present

## 2017-07-23 DIAGNOSIS — Z9484 Stem cells transplant status: Secondary | ICD-10-CM | POA: Diagnosis not present

## 2017-07-23 DIAGNOSIS — Z79899 Other long term (current) drug therapy: Secondary | ICD-10-CM | POA: Diagnosis not present

## 2017-07-23 DIAGNOSIS — M1990 Unspecified osteoarthritis, unspecified site: Secondary | ICD-10-CM | POA: Diagnosis not present

## 2017-08-01 ENCOUNTER — Telehealth: Payer: Self-pay | Admitting: Physician Assistant

## 2017-08-01 NOTE — Telephone Encounter (Signed)
Pt. Called with concerns about BP. Reports 140/80. Asserted that due to recent stem cell transplant she was told not to take BP medication. Pt. Was wondering if she could get the providers advice about wether or not she should start taking BP medication.   Please Advise

## 2017-08-03 NOTE — Telephone Encounter (Signed)
Pt is wanting to know if she needs to start medication for bp post stem cell transplant. Having concerns regarding her blood pressure.

## 2017-08-07 DIAGNOSIS — J45901 Unspecified asthma with (acute) exacerbation: Secondary | ICD-10-CM | POA: Diagnosis not present

## 2017-08-08 ENCOUNTER — Other Ambulatory Visit: Payer: Self-pay | Admitting: Physician Assistant

## 2017-08-08 MED ORDER — TRAMADOL HCL 50 MG PO TABS: 25.0000 mg | ORAL_TABLET | Freq: Two times a day (BID) | ORAL | 2 refills | Status: DC | PRN

## 2017-08-08 MED ORDER — RAMIPRIL 2.5 MG PO CAPS: 2.5000 mg | ORAL_CAPSULE | Freq: Every day | ORAL | 3 refills | Status: DC

## 2017-08-08 NOTE — Progress Notes (Signed)
Patient requesting refills of her previous BP medication. Notes her pressures have been elevated into the 150s.  She has been gaining weight intentionally, as weight loss was a major concern for her given she is status post bone marrow transplant roughly 1.5 years ago.    Requesting tramadol for everyday aches and pains.  I have prescribed this for her in the past and she takes sparingly. [name], MS, PA-C 7:20 PM, 08/08/2017

## 2017-08-26 DIAGNOSIS — C8330 Diffuse large B-cell lymphoma, unspecified site: Secondary | ICD-10-CM | POA: Diagnosis not present

## 2017-08-26 DIAGNOSIS — C8338 Diffuse large B-cell lymphoma, lymph nodes of multiple sites: Secondary | ICD-10-CM | POA: Diagnosis not present

## 2017-08-27 NOTE — Telephone Encounter (Signed)
Copied from [place] [id number]. Topic: General - Other >> Aug 27, 2017 11:52 AM Yvette Rack wrote: Reason for CRM: Pt husband Herbie [place] he has some questions and would like a [name] to call him back. Herbie [place] would not go in to detail. He just requested a call back at [phone] >> Aug 27, 2017 11:56 AM Ahmed Prima L wrote: Patient's husband would like to know information on her PET scan she had yesterday

## 2017-09-07 DIAGNOSIS — J45901 Unspecified asthma with (acute) exacerbation: Secondary | ICD-10-CM | POA: Diagnosis not present

## 2017-10-08 DIAGNOSIS — J45901 Unspecified asthma with (acute) exacerbation: Secondary | ICD-10-CM | POA: Diagnosis not present

## 2017-10-21 NOTE — Telephone Encounter (Signed)
error 

## 2017-10-22 DIAGNOSIS — R2689 Other abnormalities of gait and mobility: Secondary | ICD-10-CM | POA: Diagnosis not present

## 2017-10-22 DIAGNOSIS — Z9484 Stem cells transplant status: Secondary | ICD-10-CM | POA: Diagnosis not present

## 2017-10-22 DIAGNOSIS — Z8572 Personal history of non-Hodgkin lymphomas: Secondary | ICD-10-CM | POA: Diagnosis not present

## 2017-10-22 DIAGNOSIS — Z23 Encounter for immunization: Secondary | ICD-10-CM | POA: Diagnosis not present

## 2017-10-22 DIAGNOSIS — C8290 Follicular lymphoma, unspecified, unspecified site: Secondary | ICD-10-CM | POA: Diagnosis not present

## 2017-10-22 DIAGNOSIS — C8330 Diffuse large B-cell lymphoma, unspecified site: Secondary | ICD-10-CM | POA: Diagnosis not present

## 2017-10-22 DIAGNOSIS — Z08 Encounter for follow-up examination after completed treatment for malignant neoplasm: Secondary | ICD-10-CM | POA: Diagnosis not present

## 2017-11-02 DIAGNOSIS — R2689 Other abnormalities of gait and mobility: Secondary | ICD-10-CM | POA: Diagnosis not present

## 2017-11-05 ENCOUNTER — Other Ambulatory Visit: Payer: Self-pay | Admitting: Physician Assistant

## 2017-11-05 NOTE — Telephone Encounter (Signed)
Message for refill xanax sent to Dr. Pamella Pert

## 2017-11-05 NOTE — Telephone Encounter (Signed)
pmp reviewed Takes prn Second to last refill was in July Needs to establish care, DM visit due in November Med refilled #15, no refills

## 2017-11-06 ENCOUNTER — Telehealth: Payer: Self-pay | Admitting: Family Medicine

## 2017-11-06 NOTE — Telephone Encounter (Signed)
Called patient in regards to wanting a rx refill. Patient needs to establish with another provider since her provider is no longer at our office per Dr. Pamella Pert. Her VM was not set up yet.

## 2017-11-07 DIAGNOSIS — J45901 Unspecified asthma with (acute) exacerbation: Secondary | ICD-10-CM | POA: Diagnosis not present

## 2017-11-17 DIAGNOSIS — L089 Local infection of the skin and subcutaneous tissue, unspecified: Secondary | ICD-10-CM | POA: Diagnosis not present

## 2017-12-08 DIAGNOSIS — J45901 Unspecified asthma with (acute) exacerbation: Secondary | ICD-10-CM | POA: Diagnosis not present

## 2017-12-14 ENCOUNTER — Telehealth: Payer: Self-pay | Admitting: Family Medicine

## 2017-12-14 NOTE — Telephone Encounter (Signed)
Called patient to obtain records from their diabetic eye exam but needing to know where it was done at. If no longer coming to our practice, then disregard. Her VM was not set up yet.

## 2017-12-17 ENCOUNTER — Telehealth: Payer: Self-pay | Admitting: Family Medicine

## 2017-12-17 NOTE — Telephone Encounter (Signed)
Patient would like a refill of medication Diclofenac Sodium topical gel for her back. I told her she may need to establish with another provider since [name] has left. Pt states understanding but has not set up an appt yet.

## 2017-12-17 NOTE — Telephone Encounter (Signed)
Message sent to scheduling Pt req refill on Diclofenac topical Last prescribed 2017 Pt needs appt for refill

## 2017-12-18 ENCOUNTER — Telehealth: Payer: Self-pay | Admitting: General Practice

## 2017-12-18 NOTE — Telephone Encounter (Signed)
Tried to call pt to set up an appt in order to obtain a refill on Diclofenac per [place]. Pt's home phone would not let me leave a message and the VM on the cell phone has not been set up. If pt calls back, please offer an appt to establish care with one of our providers that are accepting "new" patients.

## 2017-12-23 DIAGNOSIS — Z452 Encounter for adjustment and management of vascular access device: Secondary | ICD-10-CM | POA: Diagnosis not present

## 2018-01-07 DIAGNOSIS — J45901 Unspecified asthma with (acute) exacerbation: Secondary | ICD-10-CM | POA: Diagnosis not present

## 2018-01-11 DIAGNOSIS — R05 Cough: Secondary | ICD-10-CM | POA: Diagnosis not present

## 2018-01-11 DIAGNOSIS — J4541 Moderate persistent asthma with (acute) exacerbation: Secondary | ICD-10-CM | POA: Diagnosis not present

## 2018-01-25 DIAGNOSIS — Z9484 Stem cells transplant status: Secondary | ICD-10-CM | POA: Diagnosis not present

## 2018-01-25 DIAGNOSIS — K1370 Unspecified lesions of oral mucosa: Secondary | ICD-10-CM | POA: Diagnosis not present

## 2018-01-25 DIAGNOSIS — F17200 Nicotine dependence, unspecified, uncomplicated: Secondary | ICD-10-CM | POA: Diagnosis not present

## 2018-01-25 DIAGNOSIS — M545 Low back pain, unspecified: Secondary | ICD-10-CM | POA: Insufficient documentation

## 2018-01-25 DIAGNOSIS — Z23 Encounter for immunization: Secondary | ICD-10-CM | POA: Diagnosis not present

## 2018-01-25 DIAGNOSIS — Z79899 Other long term (current) drug therapy: Secondary | ICD-10-CM | POA: Diagnosis not present

## 2018-01-25 DIAGNOSIS — E041 Nontoxic single thyroid nodule: Secondary | ICD-10-CM | POA: Diagnosis not present

## 2018-01-25 DIAGNOSIS — C8330 Diffuse large B-cell lymphoma, unspecified site: Secondary | ICD-10-CM | POA: Diagnosis not present

## 2018-02-04 ENCOUNTER — Other Ambulatory Visit: Payer: Self-pay | Admitting: Physician Assistant

## 2018-02-04 DIAGNOSIS — E118 Type 2 diabetes mellitus with unspecified complications: Secondary | ICD-10-CM

## 2018-02-05 NOTE — Telephone Encounter (Signed)
Called pt and spoke with her husband (per DPR) advising of the refused meds. I advised that she will need to call and make an appt to establish care and get a med refill. Husband acknowledged and will give the message.

## 2018-02-07 DIAGNOSIS — J45901 Unspecified asthma with (acute) exacerbation: Secondary | ICD-10-CM | POA: Diagnosis not present

## 2018-02-15 DIAGNOSIS — J45901 Unspecified asthma with (acute) exacerbation: Secondary | ICD-10-CM | POA: Diagnosis not present

## 2018-02-15 DIAGNOSIS — R0602 Shortness of breath: Secondary | ICD-10-CM | POA: Diagnosis not present

## 2018-03-10 DIAGNOSIS — J45901 Unspecified asthma with (acute) exacerbation: Secondary | ICD-10-CM | POA: Diagnosis not present

## 2018-03-29 DIAGNOSIS — Z1231 Encounter for screening mammogram for malignant neoplasm of breast: Secondary | ICD-10-CM | POA: Diagnosis not present

## 2018-04-08 ENCOUNTER — Telehealth: Payer: Self-pay | Admitting: Family Medicine

## 2018-04-08 DIAGNOSIS — J45901 Unspecified asthma with (acute) exacerbation: Secondary | ICD-10-CM | POA: Diagnosis not present

## 2018-04-08 NOTE — Telephone Encounter (Signed)
Called patient in regards to setting up a transfer of care appointment with Dr. Pamella Pert or Dr. Nolon Rod in order to get a med refill. We can set up a virtual appt so patient does not have to come in. Her VM hasn't been set up.

## 2018-04-14 ENCOUNTER — Other Ambulatory Visit: Payer: Self-pay

## 2018-04-14 ENCOUNTER — Ambulatory Visit (INDEPENDENT_AMBULATORY_CARE_PROVIDER_SITE_OTHER): Payer: PPO | Admitting: Family Medicine

## 2018-04-14 VITALS — Ht 63.0 in | Wt 142.0 lb

## 2018-04-14 DIAGNOSIS — H01003 Unspecified blepharitis right eye, unspecified eyelid: Secondary | ICD-10-CM | POA: Diagnosis not present

## 2018-04-14 DIAGNOSIS — H2513 Age-related nuclear cataract, bilateral: Secondary | ICD-10-CM | POA: Diagnosis not present

## 2018-04-14 DIAGNOSIS — Z0000 Encounter for general adult medical examination without abnormal findings: Secondary | ICD-10-CM

## 2018-04-14 DIAGNOSIS — H04123 Dry eye syndrome of bilateral lacrimal glands: Secondary | ICD-10-CM | POA: Diagnosis not present

## 2018-04-14 DIAGNOSIS — H353131 Nonexudative age-related macular degeneration, bilateral, early dry stage: Secondary | ICD-10-CM | POA: Diagnosis not present

## 2018-04-14 NOTE — Progress Notes (Signed)
Presents today for Medicare Annual Wellness Visit    Patient Care Team: Brantley Fling, MD as Referring Physician (Internal Medicine)     Immunization status:  Immunization History  Administered Date(s) Administered  . Influenza,inj,Quad PF,6+ Mos 10/03/2014  . Influenza-Unspecified 11/11/2013  . Pneumococcal Conjugate-13 10/03/2014  . Pneumococcal Polysaccharide-23 04/14/2016  . Td 06/27/2015     Health Maintenance Due  Topic Date Due  . FOOT EXAM  04/14/2017  . HEMOGLOBIN A1C  12/11/2017     Functional Status Survey: Is the patient deaf or have difficulty hearing?: No Does the patient have difficulty seeing, even when wearing glasses/contacts?: No Does the patient have difficulty concentrating, remembering, or making decisions?: No Does the patient have difficulty walking or climbing stairs?: No Does the patient have difficulty dressing or bathing?: No Does the patient have difficulty doing errands alone such as visiting a doctor's office or shopping?: No   6CIT Screen 04/14/2018  What Year? 0 points  What month? 0 points  What time? 0 points  Count back from 20 0 points  Months in reverse 0 points  Repeat phrase 0 points  Total Score 0        Clinical Support from 04/14/2018 in [facility] Care at [facility]  AUDIT-C Score  1         Patient Active Problem List   Diagnosis Date Noted  . Smoker 04/25/2014  . Lymphoma ([place]) 02/03/2012  . Hyperlipidemia 02/03/2012  . Hypertension 02/03/2012  . Reflux 02/03/2012  . Depression 02/03/2012     Past Medical History:  Diagnosis Date  . Anxiety   . Arthritis   . Diabetes ([place])   . Heartburn   . HTN (hypertension)   . Non Hodgkin's lymphoma ([place]) 2004   Hx chemo tx's.      Past Surgical History:  Procedure Laterality Date  . TOTAL ABDOMINAL HYSTERECTOMY       Family History  Problem Relation Age of Onset  . Diabetes Mother   . Lung cancer Sister   . Lung cancer Brother   . Kidney cancer  Brother        removed kidney   . Lung cancer Brother   . Prostate cancer Neg Hx   . Bladder Cancer Neg Hx      Social History   Socioeconomic History  . Marital status: Married    Spouse name: Herbie [place]   . Number of children: 1  . Years of education: 71  . Highest education level: Not on file  Occupational History  . Not on file  Social Needs  . Financial resource strain: Not on file  . Food insecurity:    Worry: Not on file    Inability: Not on file  . Transportation needs:    Medical: Not on file    Non-medical: Not on file  Tobacco Use  . Smoking status: Current Every Day Smoker    Packs/day: 1.00  . Smokeless tobacco: Never Used  Substance and Sexual Activity  . Alcohol use: Yes    Alcohol/week: 0.0 standard drinks    Comment: Wine ocass  . Drug use: No  . Sexual activity: Not on file  Lifestyle  . Physical activity:    Days per week: Not on file    Minutes per session: Not on file  . Stress: Not on file  Relationships  . Social connections:    Talks on phone: Not on file    Gets together: Not on file  Attends religious service: Not on file    Active member of club or organization: Not on file    Attends meetings of clubs or organizations: Not on file    Relationship status: Not on file  . Intimate partner violence:    Fear of current or ex partner: Not on file    Emotionally abused: Not on file    Physically abused: Not on file    Forced sexual activity: Not on file  Other Topics Concern  . Not on file  Social History Narrative   Lives w/ husband   Caffeine use: Drinks 6 cups per day   Right-handed     Allergies  Allergen Reactions  . Other   . Tape Rash    Makes Whelps on Skin Makes Whelps on Skin     Prior to Admission medications   Medication Sig Start Date End Date Taking? Authorizing Provider  acyclovir (ZOVIRAX) 800 MG tablet Take 800 mg by mouth 2 (two) times daily.   Yes [provider]  blood glucose meter kit and  supplies KIT Test blood sugar once daily. Dx code: E11.9 06/27/15  Yes [name], PA-C  diphenoxylate-atropine (LOMOTIL) 2.5-0.025 MG tablet Take 1 tablet by mouth 4 (four) times daily as needed for diarrhea or loose stools.   Yes [provider]  FREESTYLE LITE test strip TEST BLOOD SUGAR ONCE DAILY 09/19/16  Yes [name], PA-C  Lancets (FREESTYLE) lancets TEST BLOOD GLUCOSE ONCE DAILY 07/28/16  Yes [name], PA-C  omeprazole (PRILOSEC) 20 MG capsule Take 1 capsule (20 mg total) by mouth daily. 02/16/17  Yes [name], PA-C  ramipril (ALTACE) 2.5 MG capsule Take 1 capsule (2.5 mg total) by mouth daily. 08/08/17  Yes [name], PA-C  sertraline (ZOLOFT) 100 MG tablet TAKE ONE (1) TABLET EACH DAY 06/10/17  Yes [name], PA-C  ALPRAZolam Duanne Moron) 0.5 MG tablet TAKE 1/2-1 TABLET BY MOUTH AS NEEDED FOR PANIC Patient not taking: Reported on 04/14/2018 11/05/17   Rutherford Guys, MD  estradiol (ESTRACE) 0.1 MG/GM vaginal cream Place 1 Applicatorful vaginally at bedtime. Patient not taking: Reported on 03/02/2017 02/16/17   [name], PA-C  folic acid (FOLVITE) 1 MG tablet Take 1 mg by mouth daily.    [provider]  gabapentin (NEURONTIN) 300 MG capsule Take 1 tablet each night for peripheral neuropathy Patient not taking: Reported on 04/14/2018 04/14/16   [name], PA-C  loperamide (IMODIUM) 2 MG capsule Take 1 capsule (2 mg total) by mouth 2 (two) times daily. Patient not taking: Reported on 04/14/2018 06/10/17   [name], PA-C  loratadine (CLARITIN) 10 MG tablet Take 10 mg by mouth daily as needed for allergies.    [provider]  Multiple Vitamin (MULTIVITAMIN) tablet Take 1 tablet by mouth daily.    [provider]  ondansetron (ZOFRAN) 8 MG tablet Take 0.5 tablets (4 mg total) by mouth every 8 (eight) hours as needed for nausea or vomiting. Patient not taking: Reported on 04/14/2018 06/10/17   [name], PA-C   prochlorperazine (COMPAZINE) 10 MG tablet Take 1 tablet (10 mg total) by mouth every 8 (eight) hours as needed for nausea or vomiting. 07/15/17 08/14/17  [name], PA-C  traMADol (ULTRAM) 50 MG tablet Take 0.5-1 tablets (25-50 mg total) by mouth every 12 (twelve) hours as needed for severe pain (For arthritis pain). Patient not taking: Reported on 04/14/2018 08/08/17   Carlis Abbott,  Audrie Lia, PA-C     Depression screen [facility] [place] 2/9 04/14/2018 02/16/2017 11/04/2016 05/26/2016 04/14/2016  Decreased Interest 0 0 0 0 0  Down, Depressed, Hopeless 0 1 1 0 0  PHQ - 2 Score 0 1 1 0 0  Altered sleeping - - - - -  Tired, decreased energy - - - - -  Change in appetite - - - - -  Feeling bad or failure about yourself  - - - - -  Trouble concentrating - - - - -  Moving slowly or fidgety/restless - - - - -  Suicidal thoughts - - - - -  PHQ-9 Score - - - - -  Difficult doing work/chores - - - - -     Fall Risk  04/14/2018 02/16/2017 11/04/2016 05/26/2016 04/14/2016  Falls in the past year? 1 No Yes No No  Number falls in past yr: 1 - 1 - -  Injury with Fall? 0 - Yes - -  Risk for fall due to : History of fall(s) - History of fall(s) - -  Follow up Falls prevention discussed;Education provided;Falls evaluation completed - Falls evaluation completed - -      PHYSICAL EXAM: Ht '5\' 3"'  (1.6 m)   Wt 142 lb (64.4 kg)   BMI 25.15 kg/m    Wt Readings from Last 3 Encounters:  04/14/18 142 lb (64.4 kg)  06/10/17 127 lb (57.6 kg)  03/02/17 125 lb (56.7 kg)     No exam data present    Physical Exam   Education/Counseling provided regarding diet and exercise, prevention of chronic diseases, smoking/tobacco cessation, if applicable, and reviewed "Covered Medicare Preventive Services."   ASSESSMENT/PLAN: There are no diagnoses linked to this encounter.     Patient lives two story home lives with husband. No trouble climbing stairs  Has grippers on scattered rugs Has grab bars in bathroom Well Lit  She  has 14 steps does 10 times a day  Patient states she is looses her balance   Been going on for 3 weeks no dizziness did fall hit wall 2 weeks put a hole in wall, was at work leaning over when fell.   Patient states when she gets up she is unable to put feet on the ground for about 10 minutes has not taken Gabapentin.  Patient states back pain at 8-9 by the end of day only when working.   Patient states she has seen eye doctor Dr. Ellin Mayhew 6 months ago and received 2 new pairs of glasses   Shingrix discussed with speak to oncologist.  Will schedule an appointment to address the loss of balance.

## 2018-04-14 NOTE — Patient Instructions (Signed)
Thank you for taking time to come for your Medicare Wellness Visit. I appreciate your ongoing commitment to your health goals. Please review the following plan we discussed and let me know if I can assist you in the future.  Amadi Frady LPN  Healthy Eating Following a healthy eating pattern may help you to achieve and maintain a healthy body weight, reduce the risk of chronic disease, and live a long and productive life. It is important to follow a healthy eating pattern at an appropriate calorie level for your body. Your nutritional needs should be met primarily through food by choosing a variety of nutrient-rich foods. What are tips for following this plan? Reading food labels  Read labels and choose the following: ? Reduced or low sodium. ? Juices with 100% fruit juice. ? Foods with low saturated fats and high polyunsaturated and monounsaturated fats. ? Foods with whole grains, such as whole wheat, cracked wheat, brown rice, and wild rice. ? Whole grains that are fortified with folic acid. This is recommended for women who are pregnant or who want to become pregnant.  Read labels and avoid the following: ? Foods with a lot of added sugars. These include foods that contain brown sugar, corn sweetener, corn syrup, dextrose, fructose, glucose, high-fructose corn syrup, honey, invert sugar, lactose, malt syrup, maltose, molasses, raw sugar, sucrose, trehalose, or turbinado sugar.  Do not eat more than the following amounts of added sugar per day:  6 teaspoons (25 g) for women.  9 teaspoons (38 g) for men. ? Foods that contain processed or refined starches and grains. ? Refined grain products, such as white flour, degermed cornmeal, white bread, and white rice. Shopping  Choose nutrient-rich snacks, such as vegetables, whole fruits, and nuts. Avoid high-calorie and high-sugar snacks, such as potato chips, fruit snacks, and candy.  Use oil-based dressings and spreads on foods instead of  solid fats such as butter, stick margarine, or cream cheese.  Limit pre-made sauces, mixes, and "instant" products such as flavored rice, instant noodles, and ready-made pasta.  Try more plant-protein sources, such as tofu, tempeh, black beans, edamame, lentils, nuts, and seeds.  Explore eating plans such as the Mediterranean diet or vegetarian diet. Cooking  Use oil to saut or stir-fry foods instead of solid fats such as butter, stick margarine, or lard.  Try baking, boiling, grilling, or broiling instead of frying.  Remove the fatty part of meats before cooking.  Steam vegetables in water or broth. Meal planning   At meals, imagine dividing your plate into fourths: ? One-half of your plate is fruits and vegetables. ? One-fourth of your plate is whole grains. ? One-fourth of your plate is protein, especially lean meats, poultry, eggs, tofu, beans, or nuts.  Include low-fat dairy as part of your daily diet. Lifestyle  Choose healthy options in all settings, including home, work, school, restaurants, or stores.  Prepare your food safely: ? Wash your hands after handling raw meats. ? Keep food preparation surfaces clean by regularly washing with hot, soapy water. ? Keep raw meats separate from ready-to-eat foods, such as fruits and vegetables. ? Cook seafood, meat, poultry, and eggs to the recommended internal temperature. ? Store foods at safe temperatures. In general:  Keep cold foods at 40F (4.4C) or below.  Keep hot foods at 140F (60C) or above.  Keep your freezer at 0F (-17.8C) or below.  Foods are no longer safe to eat when they have been between the temperatures of 40-140F (4.4-60C) for   eat when they have been between the temperatures of 40-140F (4.4-60C) for more than 2 hours. What foods should I eat? Fruits Aim to eat 2 cup-equivalents of fresh, canned (in natural juice), or frozen fruits each day. Examples of 1 cup-equivalent of fruit include 1 small apple, 8 large strawberries, 1 cup canned fruit,   cup dried fruit, or 1 cup 100% juice. Vegetables Aim to eat 2-3 cup-equivalents of fresh and frozen vegetables each day, including different varieties and colors. Examples of 1 cup-equivalent of vegetables include 2 medium carrots, 2 cups raw, leafy greens, 1 cup chopped vegetable (raw or cooked), or 1 medium baked potato. Grains Aim to eat 6 ounce-equivalents of whole grains each day. Examples of 1 ounce-equivalent of grains include 1 slice of bread, 1 cup ready-to-eat cereal, 3 cups popcorn, or  cup cooked rice, pasta, or cereal. Meats and other proteins Aim to eat 5-6 ounce-equivalents of protein each day. Examples of 1 ounce-equivalent of protein include 1 egg, 1/2 cup nuts or seeds, or 1 tablespoon (16 g) peanut butter. A cut of meat or fish that is the size of a deck of cards is about 3-4 ounce-equivalents.  Of the protein you eat each week, try to have at least 8 ounces come from seafood. This includes salmon, trout, herring, and anchovies. Dairy Aim to eat 3 cup-equivalents of fat-free or low-fat dairy each day. Examples of 1 cup-equivalent of dairy include 1 cup (240 mL) milk, 8 ounces (250 g) yogurt, 1 ounces (44 g) natural cheese, or 1 cup (240 mL) fortified soy milk. Fats and oils  Aim for about 5 teaspoons (21 g) per day. Choose monounsaturated fats, such as canola and olive oils, avocados, peanut butter, and most nuts, or polyunsaturated fats, such as sunflower, corn, and soybean oils, walnuts, pine nuts, sesame seeds, sunflower seeds, and flaxseed. Beverages  Aim for six 8-oz glasses of water per day. Limit coffee to three to five 8-oz cups per day.  Limit caffeinated beverages that have added calories, such as soda and energy drinks.  Limit alcohol intake to no more than 1 drink a day for nonpregnant women and 2 drinks a day for men. One drink equals 12 oz of beer (355 mL), 5 oz of wine (148 mL), or 1 oz of hard liquor (44 mL). Seasoning and other foods  Avoid adding  excess amounts of salt to your foods. Try flavoring foods with herbs and spices instead of salt.  Avoid adding sugar to foods.  Try using oil-based dressings, sauces, and spreads instead of solid fats. This information is based on general [place] nutrition guidelines. For more information, visit choosemyplate.gov. Exact amounts may vary based on your nutrition needs. Summary  A healthy eating plan may help you to maintain a healthy weight, reduce the risk of chronic diseases, and stay active throughout your life.  Plan your meals. Make sure you eat the right portions of a variety of nutrient-rich foods.  Try baking, boiling, grilling, or broiling instead of frying.  Choose healthy options in all settings, including home, work, school, restaurants, or stores. This information is not intended to replace advice given to you by your health care provider. Make sure you discuss any questions you have with your health care provider. Document Released: 04/13/2017 Document Revised: 04/13/2017 Document Reviewed: 04/13/2017 Elsevier Interactive Patient Education  2019 [employer] Advance Directive  Advance directives are legal documents that let you make choices ahead of time about your health care and medical treatment in case   you become unable to communicate for yourself. Advance directives are a way for you to communicate your wishes to family, friends, and health care providers. This can help convey your decisions about end-of-life care if you become unable to communicate. Discussing and writing advance directives should happen over time rather than all at once. Advance directives can be changed depending on your situation and what you want, even after you have signed the advance directives. If you do not have an advance directive, some states assign family decision makers to act on your behalf based on how closely you are related to them. Each state has its own laws regarding advance directives. You  may want to check with your health care provider, attorney, or state representative about the laws in your state. There are different types of advance directives, such as:  Medical power of attorney.  Living will.  Do not resuscitate (DNR) or do not attempt resuscitation (DNAR) order. Health care proxy and medical power of attorney A health care proxy, also called a health care agent, is a person who is appointed to make medical decisions for you in cases in which you are unable to make the decisions yourself. Generally, people choose someone they know well and trust to represent their preferences. Make sure to ask this person for an agreement to act as your proxy. A proxy may have to exercise judgment in the event of a medical decision for which your wishes are not known. A medical power of attorney is a legal document that names your health care proxy. Depending on the laws in your state, after the document is written, it may also need to be:  Signed.  Notarized.  Dated.  Copied.  Witnessed.  Incorporated into your medical record. You may also want to appoint someone to manage your financial affairs in a situation in which you are unable to do so. This is called a durable power of attorney for finances. It is a separate legal document from the durable power of attorney for health care. You may choose the same person or someone different from your health care proxy to act as your agent in financial matters. If you do not appoint a proxy, or if there is a concern that the proxy is not acting in your best interests, a court-appointed guardian may be designated to act on your behalf. Living will A living will is a set of instructions documenting your wishes about medical care when you cannot express them yourself. Health care providers should keep a copy of your living will in your medical record. You may want to give a copy to family members or friends. To alert caregivers in case of an  emergency, you can place a card in your wallet to let them know that you have a living will and where they can find it. A living will is used if you become:  Terminally ill.  Incapacitated.  Unable to communicate or make decisions. Items to consider in your living will include:  The use or non-use of life-sustaining equipment, such as dialysis machines and breathing machines (ventilators).  A DNR or DNAR order, which is the instruction not to use cardiopulmonary resuscitation (CPR) if breathing or heartbeat stops.  The use or non-use of tube feeding.  Withholding of food and fluids.  Comfort (palliative) care when the goal becomes comfort rather than a cure.  Organ and tissue donation. A living will does not give instructions for distributing your money and property if you should   pass away. It is recommended that you seek the advice of a lawyer when writing a will. Decisions about taxes, beneficiaries, and asset distribution will be legally binding. This process can relieve your family and friends of any concerns surrounding disputes or questions that may come up about the distribution of your assets. DNR or DNAR A DNR or DNAR order is a request not to have CPR in the event that your heart stops beating or you stop breathing. If a DNR or DNAR order has not been made and shared, a health care provider will try to help any patient whose heart has stopped or who has stopped breathing. If you plan to have surgery, talk with your health care provider about how your DNR or DNAR order will be followed if problems occur. Summary  Advance directives are the legal documents that allow you to make choices ahead of time about your health care and medical treatment in case you become unable to communicate for yourself.  The process of discussing and writing advance directives should happen over time. You can change the advance directives, even after you have signed them.  Advance directives include  DNR or DNAR orders, living wills, and designating an agent as your medical power of attorney. This information is not intended to replace advice given to you by your health care provider. Make sure you discuss any questions you have with your health care provider. Document Released: 04/08/2007 Document Revised: 11/19/2015 Document Reviewed: 11/19/2015 Elsevier Interactive Patient Education  2019 [employer]  

## 2018-04-19 ENCOUNTER — Ambulatory Visit: Payer: PPO | Admitting: Family Medicine

## 2018-04-22 DIAGNOSIS — N5201 Erectile dysfunction due to arterial insufficiency: Secondary | ICD-10-CM | POA: Diagnosis not present

## 2018-04-22 DIAGNOSIS — Z7901 Long term (current) use of anticoagulants: Secondary | ICD-10-CM | POA: Diagnosis not present

## 2018-04-22 DIAGNOSIS — E230 Hypopituitarism: Secondary | ICD-10-CM | POA: Diagnosis not present

## 2018-04-22 DIAGNOSIS — R3915 Urgency of urination: Secondary | ICD-10-CM | POA: Diagnosis not present

## 2018-04-22 DIAGNOSIS — I340 Nonrheumatic mitral (valve) insufficiency: Secondary | ICD-10-CM | POA: Diagnosis not present

## 2018-04-22 DIAGNOSIS — Z9181 History of falling: Secondary | ICD-10-CM | POA: Diagnosis not present

## 2018-04-22 DIAGNOSIS — Z0000 Encounter for general adult medical examination without abnormal findings: Secondary | ICD-10-CM | POA: Diagnosis not present

## 2018-04-22 DIAGNOSIS — N138 Other obstructive and reflux uropathy: Secondary | ICD-10-CM | POA: Diagnosis not present

## 2018-04-22 DIAGNOSIS — I272 Pulmonary hypertension, unspecified: Secondary | ICD-10-CM | POA: Diagnosis not present

## 2018-04-22 DIAGNOSIS — Z79899 Other long term (current) drug therapy: Secondary | ICD-10-CM | POA: Diagnosis not present

## 2018-04-22 DIAGNOSIS — R9431 Abnormal electrocardiogram [ECG] [EKG]: Secondary | ICD-10-CM | POA: Diagnosis not present

## 2018-04-22 DIAGNOSIS — N401 Enlarged prostate with lower urinary tract symptoms: Secondary | ICD-10-CM | POA: Diagnosis not present

## 2018-04-22 DIAGNOSIS — R42 Dizziness and giddiness: Secondary | ICD-10-CM | POA: Diagnosis not present

## 2018-04-22 DIAGNOSIS — R739 Hyperglycemia, unspecified: Secondary | ICD-10-CM | POA: Diagnosis not present

## 2018-04-22 DIAGNOSIS — R509 Fever, unspecified: Secondary | ICD-10-CM | POA: Diagnosis not present

## 2018-04-22 DIAGNOSIS — Z03818 Encounter for observation for suspected exposure to other biological agents ruled out: Secondary | ICD-10-CM | POA: Diagnosis not present

## 2018-04-22 DIAGNOSIS — Z1331 Encounter for screening for depression: Secondary | ICD-10-CM | POA: Diagnosis not present

## 2018-04-22 DIAGNOSIS — I444 Left anterior fascicular block: Secondary | ICD-10-CM | POA: Diagnosis not present

## 2018-04-22 DIAGNOSIS — E785 Hyperlipidemia, unspecified: Secondary | ICD-10-CM | POA: Diagnosis not present

## 2018-04-22 DIAGNOSIS — I10 Essential (primary) hypertension: Secondary | ICD-10-CM | POA: Diagnosis not present

## 2018-04-22 DIAGNOSIS — C8330 Diffuse large B-cell lymphoma, unspecified site: Secondary | ICD-10-CM | POA: Diagnosis not present

## 2018-04-22 DIAGNOSIS — Z125 Encounter for screening for malignant neoplasm of prostate: Secondary | ICD-10-CM | POA: Diagnosis not present

## 2018-04-22 DIAGNOSIS — C8339 Diffuse large B-cell lymphoma, extranodal and solid organ sites: Secondary | ICD-10-CM | POA: Diagnosis not present

## 2018-04-22 DIAGNOSIS — R351 Nocturia: Secondary | ICD-10-CM | POA: Diagnosis not present

## 2018-04-22 DIAGNOSIS — J189 Pneumonia, unspecified organism: Secondary | ICD-10-CM | POA: Diagnosis not present

## 2018-04-22 DIAGNOSIS — R0600 Dyspnea, unspecified: Secondary | ICD-10-CM | POA: Diagnosis not present

## 2018-04-22 DIAGNOSIS — J439 Emphysema, unspecified: Secondary | ICD-10-CM | POA: Diagnosis not present

## 2018-04-22 DIAGNOSIS — R5383 Other fatigue: Secondary | ICD-10-CM | POA: Diagnosis not present

## 2018-04-22 DIAGNOSIS — G4733 Obstructive sleep apnea (adult) (pediatric): Secondary | ICD-10-CM | POA: Diagnosis not present

## 2018-04-22 DIAGNOSIS — Z6824 Body mass index (BMI) 24.0-24.9, adult: Secondary | ICD-10-CM | POA: Diagnosis not present

## 2018-04-22 DIAGNOSIS — Z01810 Encounter for preprocedural cardiovascular examination: Secondary | ICD-10-CM | POA: Diagnosis not present

## 2018-04-22 DIAGNOSIS — M545 Low back pain: Secondary | ICD-10-CM | POA: Diagnosis not present

## 2018-04-22 DIAGNOSIS — J9611 Chronic respiratory failure with hypoxia: Secondary | ICD-10-CM | POA: Diagnosis not present

## 2018-04-22 DIAGNOSIS — Z2821 Immunization not carried out because of patient refusal: Secondary | ICD-10-CM | POA: Diagnosis not present

## 2018-04-22 DIAGNOSIS — Z5321 Procedure and treatment not carried out due to patient leaving prior to being seen by health care provider: Secondary | ICD-10-CM | POA: Diagnosis not present

## 2018-04-22 DIAGNOSIS — R0602 Shortness of breath: Secondary | ICD-10-CM | POA: Diagnosis not present

## 2018-04-22 DIAGNOSIS — Z9484 Stem cells transplant status: Secondary | ICD-10-CM | POA: Diagnosis not present

## 2018-05-03 DIAGNOSIS — R197 Diarrhea, unspecified: Secondary | ICD-10-CM | POA: Diagnosis not present

## 2018-05-09 DIAGNOSIS — J45901 Unspecified asthma with (acute) exacerbation: Secondary | ICD-10-CM | POA: Diagnosis not present

## 2018-05-12 ENCOUNTER — Other Ambulatory Visit: Payer: Self-pay | Admitting: Physician Assistant

## 2018-05-20 DIAGNOSIS — R197 Diarrhea, unspecified: Secondary | ICD-10-CM | POA: Diagnosis not present

## 2018-06-08 DIAGNOSIS — J45901 Unspecified asthma with (acute) exacerbation: Secondary | ICD-10-CM | POA: Diagnosis not present

## 2018-06-11 DIAGNOSIS — M62830 Muscle spasm of back: Secondary | ICD-10-CM | POA: Diagnosis not present

## 2018-06-11 DIAGNOSIS — J4541 Moderate persistent asthma with (acute) exacerbation: Secondary | ICD-10-CM | POA: Diagnosis not present

## 2018-06-11 DIAGNOSIS — M545 Low back pain: Secondary | ICD-10-CM | POA: Diagnosis not present

## 2018-06-17 DIAGNOSIS — M9905 Segmental and somatic dysfunction of pelvic region: Secondary | ICD-10-CM | POA: Diagnosis not present

## 2018-06-17 DIAGNOSIS — M9903 Segmental and somatic dysfunction of lumbar region: Secondary | ICD-10-CM | POA: Diagnosis not present

## 2018-06-17 DIAGNOSIS — M4306 Spondylolysis, lumbar region: Secondary | ICD-10-CM | POA: Diagnosis not present

## 2018-06-17 DIAGNOSIS — M5418 Radiculopathy, sacral and sacrococcygeal region: Secondary | ICD-10-CM | POA: Diagnosis not present

## 2018-06-18 DIAGNOSIS — M4306 Spondylolysis, lumbar region: Secondary | ICD-10-CM | POA: Diagnosis not present

## 2018-06-18 DIAGNOSIS — M9905 Segmental and somatic dysfunction of pelvic region: Secondary | ICD-10-CM | POA: Diagnosis not present

## 2018-06-18 DIAGNOSIS — M5418 Radiculopathy, sacral and sacrococcygeal region: Secondary | ICD-10-CM | POA: Diagnosis not present

## 2018-06-18 DIAGNOSIS — M9903 Segmental and somatic dysfunction of lumbar region: Secondary | ICD-10-CM | POA: Diagnosis not present

## 2018-06-21 DIAGNOSIS — M4306 Spondylolysis, lumbar region: Secondary | ICD-10-CM | POA: Diagnosis not present

## 2018-06-21 DIAGNOSIS — M9905 Segmental and somatic dysfunction of pelvic region: Secondary | ICD-10-CM | POA: Diagnosis not present

## 2018-06-21 DIAGNOSIS — M5418 Radiculopathy, sacral and sacrococcygeal region: Secondary | ICD-10-CM | POA: Diagnosis not present

## 2018-06-21 DIAGNOSIS — M9903 Segmental and somatic dysfunction of lumbar region: Secondary | ICD-10-CM | POA: Diagnosis not present

## 2018-06-23 DIAGNOSIS — M9905 Segmental and somatic dysfunction of pelvic region: Secondary | ICD-10-CM | POA: Diagnosis not present

## 2018-06-23 DIAGNOSIS — M5418 Radiculopathy, sacral and sacrococcygeal region: Secondary | ICD-10-CM | POA: Diagnosis not present

## 2018-06-23 DIAGNOSIS — M4306 Spondylolysis, lumbar region: Secondary | ICD-10-CM | POA: Diagnosis not present

## 2018-06-23 DIAGNOSIS — M9903 Segmental and somatic dysfunction of lumbar region: Secondary | ICD-10-CM | POA: Diagnosis not present

## 2018-06-25 DIAGNOSIS — M9903 Segmental and somatic dysfunction of lumbar region: Secondary | ICD-10-CM | POA: Diagnosis not present

## 2018-06-25 DIAGNOSIS — M4306 Spondylolysis, lumbar region: Secondary | ICD-10-CM | POA: Diagnosis not present

## 2018-06-25 DIAGNOSIS — M9905 Segmental and somatic dysfunction of pelvic region: Secondary | ICD-10-CM | POA: Diagnosis not present

## 2018-06-25 DIAGNOSIS — M5418 Radiculopathy, sacral and sacrococcygeal region: Secondary | ICD-10-CM | POA: Diagnosis not present

## 2018-06-28 DIAGNOSIS — M9903 Segmental and somatic dysfunction of lumbar region: Secondary | ICD-10-CM | POA: Diagnosis not present

## 2018-06-28 DIAGNOSIS — Z03818 Encounter for observation for suspected exposure to other biological agents ruled out: Secondary | ICD-10-CM | POA: Diagnosis not present

## 2018-06-28 DIAGNOSIS — M4306 Spondylolysis, lumbar region: Secondary | ICD-10-CM | POA: Diagnosis not present

## 2018-06-28 DIAGNOSIS — M5418 Radiculopathy, sacral and sacrococcygeal region: Secondary | ICD-10-CM | POA: Diagnosis not present

## 2018-06-28 DIAGNOSIS — M9905 Segmental and somatic dysfunction of pelvic region: Secondary | ICD-10-CM | POA: Diagnosis not present

## 2018-06-30 DIAGNOSIS — M4306 Spondylolysis, lumbar region: Secondary | ICD-10-CM | POA: Diagnosis not present

## 2018-06-30 DIAGNOSIS — M5418 Radiculopathy, sacral and sacrococcygeal region: Secondary | ICD-10-CM | POA: Diagnosis not present

## 2018-06-30 DIAGNOSIS — M9905 Segmental and somatic dysfunction of pelvic region: Secondary | ICD-10-CM | POA: Diagnosis not present

## 2018-06-30 DIAGNOSIS — M9903 Segmental and somatic dysfunction of lumbar region: Secondary | ICD-10-CM | POA: Diagnosis not present

## 2018-07-01 DIAGNOSIS — F419 Anxiety disorder, unspecified: Secondary | ICD-10-CM | POA: Diagnosis not present

## 2018-07-01 DIAGNOSIS — M545 Low back pain: Secondary | ICD-10-CM | POA: Diagnosis not present

## 2018-07-01 DIAGNOSIS — J449 Chronic obstructive pulmonary disease, unspecified: Secondary | ICD-10-CM | POA: Diagnosis not present

## 2018-07-02 DIAGNOSIS — M5418 Radiculopathy, sacral and sacrococcygeal region: Secondary | ICD-10-CM | POA: Diagnosis not present

## 2018-07-02 DIAGNOSIS — M9903 Segmental and somatic dysfunction of lumbar region: Secondary | ICD-10-CM | POA: Diagnosis not present

## 2018-07-02 DIAGNOSIS — M9905 Segmental and somatic dysfunction of pelvic region: Secondary | ICD-10-CM | POA: Diagnosis not present

## 2018-07-02 DIAGNOSIS — M4306 Spondylolysis, lumbar region: Secondary | ICD-10-CM | POA: Diagnosis not present

## 2018-07-05 DIAGNOSIS — M9905 Segmental and somatic dysfunction of pelvic region: Secondary | ICD-10-CM | POA: Diagnosis not present

## 2018-07-05 DIAGNOSIS — M9903 Segmental and somatic dysfunction of lumbar region: Secondary | ICD-10-CM | POA: Diagnosis not present

## 2018-07-05 DIAGNOSIS — M4306 Spondylolysis, lumbar region: Secondary | ICD-10-CM | POA: Diagnosis not present

## 2018-07-05 DIAGNOSIS — M5418 Radiculopathy, sacral and sacrococcygeal region: Secondary | ICD-10-CM | POA: Diagnosis not present

## 2018-07-07 DIAGNOSIS — M9903 Segmental and somatic dysfunction of lumbar region: Secondary | ICD-10-CM | POA: Diagnosis not present

## 2018-07-07 DIAGNOSIS — M5418 Radiculopathy, sacral and sacrococcygeal region: Secondary | ICD-10-CM | POA: Diagnosis not present

## 2018-07-07 DIAGNOSIS — M9905 Segmental and somatic dysfunction of pelvic region: Secondary | ICD-10-CM | POA: Diagnosis not present

## 2018-07-07 DIAGNOSIS — M4306 Spondylolysis, lumbar region: Secondary | ICD-10-CM | POA: Diagnosis not present

## 2018-07-09 DIAGNOSIS — J45901 Unspecified asthma with (acute) exacerbation: Secondary | ICD-10-CM | POA: Diagnosis not present

## 2018-07-12 DIAGNOSIS — M9903 Segmental and somatic dysfunction of lumbar region: Secondary | ICD-10-CM | POA: Diagnosis not present

## 2018-07-12 DIAGNOSIS — M25552 Pain in left hip: Secondary | ICD-10-CM | POA: Diagnosis not present

## 2018-07-12 DIAGNOSIS — M4306 Spondylolysis, lumbar region: Secondary | ICD-10-CM | POA: Diagnosis not present

## 2018-07-12 DIAGNOSIS — M9905 Segmental and somatic dysfunction of pelvic region: Secondary | ICD-10-CM | POA: Diagnosis not present

## 2018-07-12 DIAGNOSIS — M545 Low back pain: Secondary | ICD-10-CM | POA: Diagnosis not present

## 2018-07-12 DIAGNOSIS — M5418 Radiculopathy, sacral and sacrococcygeal region: Secondary | ICD-10-CM | POA: Diagnosis not present

## 2018-07-20 DIAGNOSIS — M545 Low back pain: Secondary | ICD-10-CM | POA: Diagnosis not present

## 2018-07-22 DIAGNOSIS — C8290 Follicular lymphoma, unspecified, unspecified site: Secondary | ICD-10-CM | POA: Diagnosis not present

## 2018-07-22 DIAGNOSIS — B009 Herpesviral infection, unspecified: Secondary | ICD-10-CM | POA: Diagnosis not present

## 2018-07-22 DIAGNOSIS — C8330 Diffuse large B-cell lymphoma, unspecified site: Secondary | ICD-10-CM | POA: Diagnosis not present

## 2018-07-26 DIAGNOSIS — M545 Low back pain: Secondary | ICD-10-CM | POA: Diagnosis not present

## 2018-07-29 DIAGNOSIS — M545 Low back pain: Secondary | ICD-10-CM | POA: Diagnosis not present

## 2018-08-03 DIAGNOSIS — M545 Low back pain: Secondary | ICD-10-CM | POA: Diagnosis not present

## 2018-08-05 DIAGNOSIS — M545 Low back pain: Secondary | ICD-10-CM | POA: Diagnosis not present

## 2018-08-08 DIAGNOSIS — J45901 Unspecified asthma with (acute) exacerbation: Secondary | ICD-10-CM | POA: Diagnosis not present

## 2018-08-10 DIAGNOSIS — M545 Low back pain: Secondary | ICD-10-CM | POA: Diagnosis not present

## 2018-08-12 DIAGNOSIS — M545 Low back pain: Secondary | ICD-10-CM | POA: Diagnosis not present

## 2018-08-18 DIAGNOSIS — M545 Low back pain: Secondary | ICD-10-CM | POA: Diagnosis not present

## 2018-08-20 DIAGNOSIS — M545 Low back pain: Secondary | ICD-10-CM | POA: Diagnosis not present

## 2018-08-24 DIAGNOSIS — M545 Low back pain: Secondary | ICD-10-CM | POA: Diagnosis not present

## 2018-08-27 DIAGNOSIS — M545 Low back pain: Secondary | ICD-10-CM | POA: Diagnosis not present

## 2018-08-31 DIAGNOSIS — M545 Low back pain: Secondary | ICD-10-CM | POA: Diagnosis not present

## 2018-09-02 DIAGNOSIS — M545 Low back pain: Secondary | ICD-10-CM | POA: Diagnosis not present

## 2018-09-07 DIAGNOSIS — M545 Low back pain: Secondary | ICD-10-CM | POA: Diagnosis not present

## 2018-09-08 DIAGNOSIS — J45901 Unspecified asthma with (acute) exacerbation: Secondary | ICD-10-CM | POA: Diagnosis not present

## 2018-09-16 DIAGNOSIS — M545 Low back pain: Secondary | ICD-10-CM | POA: Diagnosis not present

## 2018-09-21 DIAGNOSIS — M545 Low back pain: Secondary | ICD-10-CM | POA: Diagnosis not present

## 2018-09-23 DIAGNOSIS — M545 Low back pain: Secondary | ICD-10-CM | POA: Diagnosis not present

## 2018-09-24 ENCOUNTER — Other Ambulatory Visit: Payer: Self-pay | Admitting: Physician Assistant

## 2018-09-24 DIAGNOSIS — R112 Nausea with vomiting, unspecified: Secondary | ICD-10-CM

## 2018-09-24 NOTE — Telephone Encounter (Signed)
Requested medication (s) are due for refill today: yes  Requested medication (s) are on the active medication list: yes  Last refill: 07/01/2018  Future visit scheduled: no  Notes to clinic:  Review for refill    Requested Prescriptions  Pending Prescriptions Disp Refills   prochlorperazine (COMPAZINE) 10 MG tablet [Pharmacy Med Name: PROCHLORPERAZINE 10 MG TAB] 30 tablet 0    Sig: TAKE 1 TABLET EVERY 8 HOURS AS NEEDED FOR NAUSEA AND VOMITING     Not Delegated - Gastroenterology: Antiemetics Failed - 09/24/2018  1:06 PM      Failed - This refill cannot be delegated      Passed - Valid encounter within last 6 months    Recent Outpatient Visits          5 months ago Medicare annual wellness visit, subsequent   [facility], Ranell Patrick, MD   1 year ago Loss of weight   [facility], Audrie Lia, PA-C   1 year ago Dysuria   [facility] [place] [facility], PA-C   2 years ago Gastroesophageal reflux disease with esophagitis   [facility], Audrie Lia, PA-C   2 years ago Gross hematuria   [facility], PA-C

## 2018-09-28 DIAGNOSIS — M545 Low back pain: Secondary | ICD-10-CM | POA: Diagnosis not present

## 2018-09-30 DIAGNOSIS — M545 Low back pain: Secondary | ICD-10-CM | POA: Diagnosis not present

## 2018-10-05 DIAGNOSIS — M545 Low back pain: Secondary | ICD-10-CM | POA: Diagnosis not present

## 2018-10-09 DIAGNOSIS — J45901 Unspecified asthma with (acute) exacerbation: Secondary | ICD-10-CM | POA: Diagnosis not present

## 2018-10-12 ENCOUNTER — Other Ambulatory Visit: Payer: Self-pay | Admitting: Otolaryngology

## 2018-10-12 DIAGNOSIS — D37030 Neoplasm of uncertain behavior of the parotid salivary glands: Secondary | ICD-10-CM | POA: Diagnosis not present

## 2018-10-12 DIAGNOSIS — R221 Localized swelling, mass and lump, neck: Secondary | ICD-10-CM

## 2018-10-14 DIAGNOSIS — M545 Low back pain: Secondary | ICD-10-CM | POA: Diagnosis not present

## 2018-10-21 DIAGNOSIS — C8338 Diffuse large B-cell lymphoma, lymph nodes of multiple sites: Secondary | ICD-10-CM | POA: Diagnosis not present

## 2018-10-21 DIAGNOSIS — C8331 Diffuse large B-cell lymphoma, lymph nodes of head, face, and neck: Secondary | ICD-10-CM | POA: Diagnosis not present

## 2018-10-21 DIAGNOSIS — M7989 Other specified soft tissue disorders: Secondary | ICD-10-CM | POA: Diagnosis not present

## 2018-10-21 DIAGNOSIS — Z9484 Stem cells transplant status: Secondary | ICD-10-CM | POA: Diagnosis not present

## 2018-10-22 DIAGNOSIS — C8331 Diffuse large B-cell lymphoma, lymph nodes of head, face, and neck: Secondary | ICD-10-CM | POA: Diagnosis not present

## 2018-10-25 DIAGNOSIS — C8331 Diffuse large B-cell lymphoma, lymph nodes of head, face, and neck: Secondary | ICD-10-CM | POA: Diagnosis not present

## 2018-10-25 DIAGNOSIS — M7989 Other specified soft tissue disorders: Secondary | ICD-10-CM | POA: Diagnosis not present

## 2018-10-25 DIAGNOSIS — C8339 Diffuse large B-cell lymphoma, extranodal and solid organ sites: Secondary | ICD-10-CM | POA: Diagnosis not present

## 2018-10-26 ENCOUNTER — Ambulatory Visit: Payer: PPO

## 2018-10-27 DIAGNOSIS — Z91040 Latex allergy status: Secondary | ICD-10-CM | POA: Diagnosis not present

## 2018-10-27 DIAGNOSIS — Z20828 Contact with and (suspected) exposure to other viral communicable diseases: Secondary | ICD-10-CM | POA: Diagnosis not present

## 2018-10-27 DIAGNOSIS — Z01812 Encounter for preprocedural laboratory examination: Secondary | ICD-10-CM | POA: Diagnosis not present

## 2018-10-27 DIAGNOSIS — D37030 Neoplasm of uncertain behavior of the parotid salivary glands: Secondary | ICD-10-CM | POA: Diagnosis not present

## 2018-10-27 DIAGNOSIS — F17210 Nicotine dependence, cigarettes, uncomplicated: Secondary | ICD-10-CM | POA: Diagnosis not present

## 2018-10-27 DIAGNOSIS — Z886 Allergy status to analgesic agent status: Secondary | ICD-10-CM | POA: Diagnosis not present

## 2018-11-01 DIAGNOSIS — D649 Anemia, unspecified: Secondary | ICD-10-CM | POA: Diagnosis not present

## 2018-11-01 DIAGNOSIS — J449 Chronic obstructive pulmonary disease, unspecified: Secondary | ICD-10-CM | POA: Diagnosis not present

## 2018-11-01 DIAGNOSIS — Z20828 Contact with and (suspected) exposure to other viral communicable diseases: Secondary | ICD-10-CM | POA: Diagnosis not present

## 2018-11-01 DIAGNOSIS — F410 Panic disorder [episodic paroxysmal anxiety] without agoraphobia: Secondary | ICD-10-CM | POA: Diagnosis not present

## 2018-11-01 DIAGNOSIS — K219 Gastro-esophageal reflux disease without esophagitis: Secondary | ICD-10-CM | POA: Diagnosis not present

## 2018-11-01 DIAGNOSIS — K118 Other diseases of salivary glands: Secondary | ICD-10-CM | POA: Diagnosis not present

## 2018-11-01 DIAGNOSIS — I10 Essential (primary) hypertension: Secondary | ICD-10-CM | POA: Diagnosis not present

## 2018-11-01 DIAGNOSIS — D37030 Neoplasm of uncertain behavior of the parotid salivary glands: Secondary | ICD-10-CM | POA: Diagnosis not present

## 2018-11-01 DIAGNOSIS — F419 Anxiety disorder, unspecified: Secondary | ICD-10-CM | POA: Diagnosis not present

## 2018-11-01 DIAGNOSIS — C8511 Unspecified B-cell lymphoma, lymph nodes of head, face, and neck: Secondary | ICD-10-CM | POA: Diagnosis not present

## 2018-11-01 DIAGNOSIS — N952 Postmenopausal atrophic vaginitis: Secondary | ICD-10-CM | POA: Diagnosis not present

## 2018-11-01 DIAGNOSIS — Z8572 Personal history of non-Hodgkin lymphomas: Secondary | ICD-10-CM | POA: Diagnosis not present

## 2018-11-01 DIAGNOSIS — Z87891 Personal history of nicotine dependence: Secondary | ICD-10-CM | POA: Diagnosis not present

## 2018-11-01 DIAGNOSIS — C8331 Diffuse large B-cell lymphoma, lymph nodes of head, face, and neck: Secondary | ICD-10-CM | POA: Diagnosis not present

## 2018-11-01 DIAGNOSIS — F329 Major depressive disorder, single episode, unspecified: Secondary | ICD-10-CM | POA: Diagnosis not present

## 2018-11-04 DIAGNOSIS — C8331 Diffuse large B-cell lymphoma, lymph nodes of head, face, and neck: Secondary | ICD-10-CM | POA: Diagnosis not present

## 2018-11-04 DIAGNOSIS — C8290 Follicular lymphoma, unspecified, unspecified site: Secondary | ICD-10-CM | POA: Diagnosis not present

## 2018-11-08 DIAGNOSIS — J45901 Unspecified asthma with (acute) exacerbation: Secondary | ICD-10-CM | POA: Diagnosis not present

## 2018-11-11 DIAGNOSIS — Z9484 Stem cells transplant status: Secondary | ICD-10-CM | POA: Diagnosis not present

## 2018-11-11 DIAGNOSIS — Q181 Preauricular sinus and cyst: Secondary | ICD-10-CM | POA: Diagnosis not present

## 2018-11-11 DIAGNOSIS — C8330 Diffuse large B-cell lymphoma, unspecified site: Secondary | ICD-10-CM | POA: Diagnosis not present

## 2018-11-11 DIAGNOSIS — C8338 Diffuse large B-cell lymphoma, lymph nodes of multiple sites: Secondary | ICD-10-CM | POA: Diagnosis not present

## 2018-11-11 DIAGNOSIS — R638 Other symptoms and signs concerning food and fluid intake: Secondary | ICD-10-CM | POA: Diagnosis not present

## 2018-11-11 DIAGNOSIS — G893 Neoplasm related pain (acute) (chronic): Secondary | ICD-10-CM | POA: Diagnosis not present

## 2018-11-11 DIAGNOSIS — Z79899 Other long term (current) drug therapy: Secondary | ICD-10-CM | POA: Diagnosis not present

## 2018-11-11 DIAGNOSIS — M8580 Other specified disorders of bone density and structure, unspecified site: Secondary | ICD-10-CM | POA: Diagnosis not present

## 2018-11-18 DIAGNOSIS — Z9484 Stem cells transplant status: Secondary | ICD-10-CM | POA: Diagnosis not present

## 2018-11-18 DIAGNOSIS — C8338 Diffuse large B-cell lymphoma, lymph nodes of multiple sites: Secondary | ICD-10-CM | POA: Diagnosis not present

## 2018-11-18 DIAGNOSIS — Z5111 Encounter for antineoplastic chemotherapy: Secondary | ICD-10-CM | POA: Diagnosis not present

## 2018-11-18 DIAGNOSIS — Z79899 Other long term (current) drug therapy: Secondary | ICD-10-CM | POA: Diagnosis not present

## 2018-11-25 DIAGNOSIS — Z9484 Stem cells transplant status: Secondary | ICD-10-CM | POA: Diagnosis not present

## 2018-11-25 DIAGNOSIS — Z5111 Encounter for antineoplastic chemotherapy: Secondary | ICD-10-CM | POA: Diagnosis not present

## 2018-11-25 DIAGNOSIS — C8338 Diffuse large B-cell lymphoma, lymph nodes of multiple sites: Secondary | ICD-10-CM | POA: Diagnosis not present

## 2018-11-25 DIAGNOSIS — Z79899 Other long term (current) drug therapy: Secondary | ICD-10-CM | POA: Diagnosis not present

## 2018-12-02 DIAGNOSIS — M898X9 Other specified disorders of bone, unspecified site: Secondary | ICD-10-CM | POA: Diagnosis not present

## 2018-12-02 DIAGNOSIS — C8330 Diffuse large B-cell lymphoma, unspecified site: Secondary | ICD-10-CM | POA: Diagnosis not present

## 2018-12-02 DIAGNOSIS — Z9484 Stem cells transplant status: Secondary | ICD-10-CM | POA: Diagnosis not present

## 2018-12-02 DIAGNOSIS — C8338 Diffuse large B-cell lymphoma, lymph nodes of multiple sites: Secondary | ICD-10-CM | POA: Diagnosis not present

## 2018-12-09 DIAGNOSIS — J45901 Unspecified asthma with (acute) exacerbation: Secondary | ICD-10-CM | POA: Diagnosis not present

## 2018-12-16 DIAGNOSIS — C8290 Follicular lymphoma, unspecified, unspecified site: Secondary | ICD-10-CM | POA: Diagnosis not present

## 2018-12-16 DIAGNOSIS — G893 Neoplasm related pain (acute) (chronic): Secondary | ICD-10-CM | POA: Diagnosis not present

## 2018-12-16 DIAGNOSIS — M62838 Other muscle spasm: Secondary | ICD-10-CM | POA: Diagnosis not present

## 2018-12-16 DIAGNOSIS — Z7982 Long term (current) use of aspirin: Secondary | ICD-10-CM | POA: Diagnosis not present

## 2019-01-08 DIAGNOSIS — J45901 Unspecified asthma with (acute) exacerbation: Secondary | ICD-10-CM | POA: Diagnosis not present

## 2019-01-13 DIAGNOSIS — C8290 Follicular lymphoma, unspecified, unspecified site: Secondary | ICD-10-CM | POA: Diagnosis not present

## 2019-01-13 DIAGNOSIS — C8330 Diffuse large B-cell lymphoma, unspecified site: Secondary | ICD-10-CM | POA: Diagnosis not present

## 2019-01-13 DIAGNOSIS — Z79899 Other long term (current) drug therapy: Secondary | ICD-10-CM | POA: Diagnosis not present

## 2019-01-13 DIAGNOSIS — C8331 Diffuse large B-cell lymphoma, lymph nodes of head, face, and neck: Secondary | ICD-10-CM | POA: Diagnosis not present

## 2019-01-13 DIAGNOSIS — R590 Localized enlarged lymph nodes: Secondary | ICD-10-CM | POA: Diagnosis not present

## 2019-01-13 DIAGNOSIS — D649 Anemia, unspecified: Secondary | ICD-10-CM | POA: Diagnosis not present

## 2019-01-13 DIAGNOSIS — G2581 Restless legs syndrome: Secondary | ICD-10-CM | POA: Diagnosis not present

## 2019-01-17 DIAGNOSIS — H16122 Filamentary keratitis, left eye: Secondary | ICD-10-CM | POA: Diagnosis not present

## 2019-01-20 DIAGNOSIS — H16122 Filamentary keratitis, left eye: Secondary | ICD-10-CM | POA: Diagnosis not present

## 2019-01-27 DIAGNOSIS — Z9484 Stem cells transplant status: Secondary | ICD-10-CM | POA: Diagnosis not present

## 2019-01-27 DIAGNOSIS — C8338 Diffuse large B-cell lymphoma, lymph nodes of multiple sites: Secondary | ICD-10-CM | POA: Diagnosis not present

## 2019-01-27 DIAGNOSIS — Z5111 Encounter for antineoplastic chemotherapy: Secondary | ICD-10-CM | POA: Diagnosis not present

## 2019-01-27 DIAGNOSIS — Z79899 Other long term (current) drug therapy: Secondary | ICD-10-CM | POA: Diagnosis not present

## 2019-02-07 DIAGNOSIS — C8298 Follicular lymphoma, unspecified, lymph nodes of multiple sites: Secondary | ICD-10-CM | POA: Diagnosis not present

## 2019-02-07 DIAGNOSIS — C8290 Follicular lymphoma, unspecified, unspecified site: Secondary | ICD-10-CM | POA: Diagnosis not present

## 2019-02-08 DIAGNOSIS — J45901 Unspecified asthma with (acute) exacerbation: Secondary | ICD-10-CM | POA: Diagnosis not present

## 2019-02-10 DIAGNOSIS — C8331 Diffuse large B-cell lymphoma, lymph nodes of head, face, and neck: Secondary | ICD-10-CM | POA: Diagnosis not present

## 2019-02-10 DIAGNOSIS — C8330 Diffuse large B-cell lymphoma, unspecified site: Secondary | ICD-10-CM | POA: Diagnosis not present

## 2019-03-10 DIAGNOSIS — F419 Anxiety disorder, unspecified: Secondary | ICD-10-CM | POA: Diagnosis not present

## 2019-03-10 DIAGNOSIS — Z79899 Other long term (current) drug therapy: Secondary | ICD-10-CM | POA: Diagnosis not present

## 2019-03-10 DIAGNOSIS — C8290 Follicular lymphoma, unspecified, unspecified site: Secondary | ICD-10-CM | POA: Diagnosis not present

## 2019-03-10 DIAGNOSIS — C8331 Diffuse large B-cell lymphoma, lymph nodes of head, face, and neck: Secondary | ICD-10-CM | POA: Diagnosis not present

## 2019-03-10 DIAGNOSIS — Z9484 Stem cells transplant status: Secondary | ICD-10-CM | POA: Diagnosis not present

## 2019-03-10 DIAGNOSIS — L989 Disorder of the skin and subcutaneous tissue, unspecified: Secondary | ICD-10-CM | POA: Diagnosis not present

## 2019-03-10 DIAGNOSIS — Z7982 Long term (current) use of aspirin: Secondary | ICD-10-CM | POA: Diagnosis not present

## 2019-03-10 DIAGNOSIS — R252 Cramp and spasm: Secondary | ICD-10-CM | POA: Diagnosis not present

## 2019-03-11 DIAGNOSIS — J45901 Unspecified asthma with (acute) exacerbation: Secondary | ICD-10-CM | POA: Diagnosis not present

## 2019-04-07 DIAGNOSIS — Z5111 Encounter for antineoplastic chemotherapy: Secondary | ICD-10-CM | POA: Diagnosis not present

## 2019-04-07 DIAGNOSIS — F419 Anxiety disorder, unspecified: Secondary | ICD-10-CM | POA: Diagnosis not present

## 2019-04-07 DIAGNOSIS — Z79891 Long term (current) use of opiate analgesic: Secondary | ICD-10-CM | POA: Diagnosis not present

## 2019-04-07 DIAGNOSIS — B009 Herpesviral infection, unspecified: Secondary | ICD-10-CM | POA: Diagnosis not present

## 2019-04-07 DIAGNOSIS — C8338 Diffuse large B-cell lymphoma, lymph nodes of multiple sites: Secondary | ICD-10-CM | POA: Diagnosis not present

## 2019-04-07 DIAGNOSIS — Z9484 Stem cells transplant status: Secondary | ICD-10-CM | POA: Diagnosis not present

## 2019-04-07 DIAGNOSIS — G893 Neoplasm related pain (acute) (chronic): Secondary | ICD-10-CM | POA: Diagnosis not present

## 2019-04-07 DIAGNOSIS — C8330 Diffuse large B-cell lymphoma, unspecified site: Secondary | ICD-10-CM | POA: Diagnosis not present

## 2019-04-07 DIAGNOSIS — Z79899 Other long term (current) drug therapy: Secondary | ICD-10-CM | POA: Diagnosis not present

## 2019-04-08 DIAGNOSIS — J45901 Unspecified asthma with (acute) exacerbation: Secondary | ICD-10-CM | POA: Diagnosis not present

## 2019-05-02 DIAGNOSIS — C8330 Diffuse large B-cell lymphoma, unspecified site: Secondary | ICD-10-CM | POA: Diagnosis not present

## 2019-05-02 DIAGNOSIS — C8339 Diffuse large B-cell lymphoma, extranodal and solid organ sites: Secondary | ICD-10-CM | POA: Diagnosis not present

## 2019-05-05 DIAGNOSIS — F419 Anxiety disorder, unspecified: Secondary | ICD-10-CM | POA: Diagnosis not present

## 2019-05-05 DIAGNOSIS — G893 Neoplasm related pain (acute) (chronic): Secondary | ICD-10-CM | POA: Diagnosis not present

## 2019-05-05 DIAGNOSIS — C8298 Follicular lymphoma, unspecified, lymph nodes of multiple sites: Secondary | ICD-10-CM | POA: Diagnosis not present

## 2019-05-05 DIAGNOSIS — Z79891 Long term (current) use of opiate analgesic: Secondary | ICD-10-CM | POA: Diagnosis not present

## 2019-05-05 DIAGNOSIS — Z5111 Encounter for antineoplastic chemotherapy: Secondary | ICD-10-CM | POA: Diagnosis not present

## 2019-05-05 DIAGNOSIS — Z7982 Long term (current) use of aspirin: Secondary | ICD-10-CM | POA: Diagnosis not present

## 2019-05-05 DIAGNOSIS — Z79899 Other long term (current) drug therapy: Secondary | ICD-10-CM | POA: Diagnosis not present

## 2019-05-05 DIAGNOSIS — C8330 Diffuse large B-cell lymphoma, unspecified site: Secondary | ICD-10-CM | POA: Diagnosis not present

## 2019-05-09 DIAGNOSIS — J45901 Unspecified asthma with (acute) exacerbation: Secondary | ICD-10-CM | POA: Diagnosis not present

## 2019-05-26 DIAGNOSIS — D509 Iron deficiency anemia, unspecified: Secondary | ICD-10-CM | POA: Diagnosis not present

## 2019-05-26 DIAGNOSIS — C8331 Diffuse large B-cell lymphoma, lymph nodes of head, face, and neck: Secondary | ICD-10-CM | POA: Diagnosis not present

## 2019-06-08 DIAGNOSIS — J45901 Unspecified asthma with (acute) exacerbation: Secondary | ICD-10-CM | POA: Diagnosis not present

## 2019-06-17 DIAGNOSIS — R109 Unspecified abdominal pain: Secondary | ICD-10-CM | POA: Diagnosis not present

## 2019-06-18 DIAGNOSIS — C8190 Hodgkin lymphoma, unspecified, unspecified site: Secondary | ICD-10-CM | POA: Diagnosis not present

## 2019-06-18 DIAGNOSIS — F329 Major depressive disorder, single episode, unspecified: Secondary | ICD-10-CM | POA: Diagnosis not present

## 2019-06-18 DIAGNOSIS — M545 Low back pain: Secondary | ICD-10-CM | POA: Diagnosis not present

## 2019-06-18 DIAGNOSIS — Z79899 Other long term (current) drug therapy: Secondary | ICD-10-CM | POA: Diagnosis not present

## 2019-06-18 DIAGNOSIS — R1030 Lower abdominal pain, unspecified: Secondary | ICD-10-CM | POA: Diagnosis not present

## 2019-06-18 DIAGNOSIS — G8929 Other chronic pain: Secondary | ICD-10-CM | POA: Diagnosis not present

## 2019-06-18 DIAGNOSIS — I10 Essential (primary) hypertension: Secondary | ICD-10-CM | POA: Diagnosis not present

## 2019-06-18 DIAGNOSIS — K219 Gastro-esophageal reflux disease without esophagitis: Secondary | ICD-10-CM | POA: Diagnosis not present

## 2019-06-18 DIAGNOSIS — R102 Pelvic and perineal pain: Secondary | ICD-10-CM | POA: Diagnosis not present

## 2019-06-18 DIAGNOSIS — F17200 Nicotine dependence, unspecified, uncomplicated: Secondary | ICD-10-CM | POA: Diagnosis not present

## 2019-06-20 DIAGNOSIS — R1031 Right lower quadrant pain: Secondary | ICD-10-CM | POA: Diagnosis not present

## 2019-06-20 DIAGNOSIS — R1032 Left lower quadrant pain: Secondary | ICD-10-CM | POA: Diagnosis not present

## 2019-06-20 DIAGNOSIS — C8290 Follicular lymphoma, unspecified, unspecified site: Secondary | ICD-10-CM | POA: Diagnosis not present

## 2019-06-20 DIAGNOSIS — R109 Unspecified abdominal pain: Secondary | ICD-10-CM | POA: Diagnosis not present

## 2019-06-21 DIAGNOSIS — G8929 Other chronic pain: Secondary | ICD-10-CM | POA: Diagnosis not present

## 2019-06-21 DIAGNOSIS — Z79899 Other long term (current) drug therapy: Secondary | ICD-10-CM | POA: Diagnosis not present

## 2019-06-21 DIAGNOSIS — C8290 Follicular lymphoma, unspecified, unspecified site: Secondary | ICD-10-CM | POA: Diagnosis not present

## 2019-06-22 DIAGNOSIS — C8290 Follicular lymphoma, unspecified, unspecified site: Secondary | ICD-10-CM | POA: Diagnosis not present

## 2019-06-22 DIAGNOSIS — C8298 Follicular lymphoma, unspecified, lymph nodes of multiple sites: Secondary | ICD-10-CM | POA: Diagnosis not present

## 2019-06-22 DIAGNOSIS — M4826 Kissing spine, lumbar region: Secondary | ICD-10-CM | POA: Diagnosis not present

## 2019-06-22 DIAGNOSIS — M6088 Other myositis, other site: Secondary | ICD-10-CM | POA: Diagnosis not present

## 2019-06-22 DIAGNOSIS — M47816 Spondylosis without myelopathy or radiculopathy, lumbar region: Secondary | ICD-10-CM | POA: Diagnosis not present

## 2019-06-30 DIAGNOSIS — D509 Iron deficiency anemia, unspecified: Secondary | ICD-10-CM | POA: Diagnosis not present

## 2019-06-30 DIAGNOSIS — C8330 Diffuse large B-cell lymphoma, unspecified site: Secondary | ICD-10-CM | POA: Diagnosis not present

## 2019-06-30 DIAGNOSIS — Z79899 Other long term (current) drug therapy: Secondary | ICD-10-CM | POA: Diagnosis not present

## 2019-06-30 DIAGNOSIS — C8331 Diffuse large B-cell lymphoma, lymph nodes of head, face, and neck: Secondary | ICD-10-CM | POA: Diagnosis not present

## 2019-06-30 DIAGNOSIS — C8290 Follicular lymphoma, unspecified, unspecified site: Secondary | ICD-10-CM | POA: Diagnosis not present

## 2019-06-30 DIAGNOSIS — G8929 Other chronic pain: Secondary | ICD-10-CM | POA: Diagnosis not present

## 2019-06-30 DIAGNOSIS — M545 Low back pain: Secondary | ICD-10-CM | POA: Diagnosis not present

## 2019-07-06 DIAGNOSIS — C8290 Follicular lymphoma, unspecified, unspecified site: Secondary | ICD-10-CM | POA: Diagnosis not present

## 2019-07-06 DIAGNOSIS — C8298 Follicular lymphoma, unspecified, lymph nodes of multiple sites: Secondary | ICD-10-CM | POA: Diagnosis not present

## 2019-07-06 DIAGNOSIS — M4726 Other spondylosis with radiculopathy, lumbar region: Secondary | ICD-10-CM | POA: Diagnosis not present

## 2019-07-08 DIAGNOSIS — C8290 Follicular lymphoma, unspecified, unspecified site: Secondary | ICD-10-CM | POA: Diagnosis not present

## 2019-07-09 DIAGNOSIS — J45901 Unspecified asthma with (acute) exacerbation: Secondary | ICD-10-CM | POA: Diagnosis not present

## 2019-07-15 ENCOUNTER — Other Ambulatory Visit: Payer: Self-pay | Admitting: General Practice

## 2019-07-19 ENCOUNTER — Other Ambulatory Visit
Admission: RE | Admit: 2019-07-19 | Discharge: 2019-07-19 | Disposition: A | Discharge status: Home / Self Care | Payer: PPO | Attending: Internal Medicine | Admitting: Internal Medicine

## 2019-07-19 DIAGNOSIS — C8330 Diffuse large B-cell lymphoma, unspecified site: Secondary | ICD-10-CM | POA: Diagnosis not present

## 2019-07-19 LAB — COMPREHENSIVE METABOLIC PANEL
ALT: 65 U/L — ABNORMAL HIGH (ref 0–44)
AST: 65 U/L — ABNORMAL HIGH (ref 15–41)
Albumin: 4.1 g/dL (ref 3.5–5.0)
Alkaline Phosphatase: 107 U/L (ref 38–126)
Anion gap: 11 (ref 5–15)
BUN: 12 mg/dL (ref 8–23)
CO2: 27 mmol/L (ref 22–32)
Calcium: 9 mg/dL (ref 8.9–10.3)
Chloride: 105 mmol/L (ref 98–111)
Creatinine, Ser: 0.8 mg/dL (ref 0.44–1.00)
GFR calc Af Amer: >60 mL/min (ref >60)
GFR calc non Af Amer: >60 mL/min (ref >60)
Glucose, Bld: 103 mg/dL — ABNORMAL HIGH (ref 70–99)
Potassium: 3 mmol/L — ABNORMAL LOW (ref 3.5–5.1)
Sodium: 143 mmol/L (ref 135–145)
Total Bilirubin: 0.9 mg/dL (ref 0.3–1.2)
Total Protein: 7.2 g/dL (ref 6.5–8.1)

## 2019-07-21 DIAGNOSIS — R293 Abnormal posture: Secondary | ICD-10-CM | POA: Diagnosis not present

## 2019-07-21 DIAGNOSIS — M545 Low back pain: Secondary | ICD-10-CM | POA: Diagnosis not present

## 2019-07-25 DIAGNOSIS — E119 Type 2 diabetes mellitus without complications: Secondary | ICD-10-CM | POA: Diagnosis not present

## 2019-07-25 DIAGNOSIS — H25013 Cortical age-related cataract, bilateral: Secondary | ICD-10-CM | POA: Diagnosis not present

## 2019-07-28 DIAGNOSIS — E876 Hypokalemia: Secondary | ICD-10-CM | POA: Diagnosis not present

## 2019-07-28 DIAGNOSIS — G629 Polyneuropathy, unspecified: Secondary | ICD-10-CM | POA: Diagnosis not present

## 2019-07-28 DIAGNOSIS — G8331 Monoplegia, unspecified affecting right dominant side: Secondary | ICD-10-CM | POA: Diagnosis not present

## 2019-07-28 DIAGNOSIS — C8290 Follicular lymphoma, unspecified, unspecified site: Secondary | ICD-10-CM | POA: Diagnosis not present

## 2019-07-28 DIAGNOSIS — E785 Hyperlipidemia, unspecified: Secondary | ICD-10-CM | POA: Diagnosis not present

## 2019-07-28 DIAGNOSIS — G8929 Other chronic pain: Secondary | ICD-10-CM | POA: Diagnosis not present

## 2019-07-28 DIAGNOSIS — C8331 Diffuse large B-cell lymphoma, lymph nodes of head, face, and neck: Secondary | ICD-10-CM | POA: Diagnosis not present

## 2019-08-08 DIAGNOSIS — J45901 Unspecified asthma with (acute) exacerbation: Secondary | ICD-10-CM | POA: Diagnosis not present

## 2019-08-09 DIAGNOSIS — C8290 Follicular lymphoma, unspecified, unspecified site: Secondary | ICD-10-CM | POA: Diagnosis not present

## 2019-08-09 DIAGNOSIS — G8929 Other chronic pain: Secondary | ICD-10-CM | POA: Diagnosis not present

## 2019-08-09 DIAGNOSIS — K521 Toxic gastroenteritis and colitis: Secondary | ICD-10-CM | POA: Diagnosis not present

## 2019-08-09 DIAGNOSIS — F329 Major depressive disorder, single episode, unspecified: Secondary | ICD-10-CM | POA: Diagnosis not present

## 2019-08-09 DIAGNOSIS — R197 Diarrhea, unspecified: Secondary | ICD-10-CM | POA: Diagnosis not present

## 2019-08-09 DIAGNOSIS — E876 Hypokalemia: Secondary | ICD-10-CM | POA: Diagnosis not present

## 2019-08-09 DIAGNOSIS — C8338 Diffuse large B-cell lymphoma, lymph nodes of multiple sites: Secondary | ICD-10-CM | POA: Diagnosis not present

## 2019-08-09 DIAGNOSIS — I10 Essential (primary) hypertension: Secondary | ICD-10-CM | POA: Diagnosis not present

## 2019-08-09 DIAGNOSIS — E860 Dehydration: Secondary | ICD-10-CM | POA: Diagnosis not present

## 2019-08-09 DIAGNOSIS — C8330 Diffuse large B-cell lymphoma, unspecified site: Secondary | ICD-10-CM | POA: Diagnosis not present

## 2019-08-09 DIAGNOSIS — F419 Anxiety disorder, unspecified: Secondary | ICD-10-CM | POA: Diagnosis not present

## 2019-08-09 DIAGNOSIS — J449 Chronic obstructive pulmonary disease, unspecified: Secondary | ICD-10-CM | POA: Diagnosis not present

## 2019-08-09 DIAGNOSIS — Z801 Family history of malignant neoplasm of trachea, bronchus and lung: Secondary | ICD-10-CM | POA: Diagnosis not present

## 2019-08-09 DIAGNOSIS — T451X5A Adverse effect of antineoplastic and immunosuppressive drugs, initial encounter: Secondary | ICD-10-CM | POA: Diagnosis not present

## 2019-08-09 DIAGNOSIS — E119 Type 2 diabetes mellitus without complications: Secondary | ICD-10-CM | POA: Diagnosis not present

## 2019-08-09 DIAGNOSIS — Z90710 Acquired absence of both cervix and uterus: Secondary | ICD-10-CM | POA: Diagnosis not present

## 2019-08-09 DIAGNOSIS — K029 Dental caries, unspecified: Secondary | ICD-10-CM | POA: Diagnosis not present

## 2019-08-09 DIAGNOSIS — Z9484 Stem cells transplant status: Secondary | ICD-10-CM | POA: Diagnosis not present

## 2019-08-09 DIAGNOSIS — M6281 Muscle weakness (generalized): Secondary | ICD-10-CM | POA: Diagnosis not present

## 2019-08-09 DIAGNOSIS — K219 Gastro-esophageal reflux disease without esophagitis: Secondary | ICD-10-CM | POA: Diagnosis not present

## 2019-08-09 DIAGNOSIS — F17210 Nicotine dependence, cigarettes, uncomplicated: Secondary | ICD-10-CM | POA: Diagnosis not present

## 2019-08-09 DIAGNOSIS — Z8051 Family history of malignant neoplasm of kidney: Secondary | ICD-10-CM | POA: Diagnosis not present

## 2019-08-09 DIAGNOSIS — C8331 Diffuse large B-cell lymphoma, lymph nodes of head, face, and neck: Secondary | ICD-10-CM | POA: Insufficient documentation

## 2019-08-10 DIAGNOSIS — Z789 Other specified health status: Secondary | ICD-10-CM | POA: Insufficient documentation

## 2019-08-10 DIAGNOSIS — Z7409 Other reduced mobility: Secondary | ICD-10-CM | POA: Insufficient documentation

## 2019-08-15 DIAGNOSIS — Z7409 Other reduced mobility: Secondary | ICD-10-CM | POA: Diagnosis not present

## 2019-08-15 DIAGNOSIS — C8330 Diffuse large B-cell lymphoma, unspecified site: Secondary | ICD-10-CM | POA: Diagnosis not present

## 2019-08-23 DIAGNOSIS — M545 Low back pain: Secondary | ICD-10-CM | POA: Diagnosis not present

## 2019-08-23 DIAGNOSIS — M4726 Other spondylosis with radiculopathy, lumbar region: Secondary | ICD-10-CM | POA: Diagnosis not present

## 2019-08-25 DIAGNOSIS — H25013 Cortical age-related cataract, bilateral: Secondary | ICD-10-CM | POA: Diagnosis not present

## 2019-08-25 DIAGNOSIS — C8338 Diffuse large B-cell lymphoma, lymph nodes of multiple sites: Secondary | ICD-10-CM | POA: Diagnosis not present

## 2019-08-25 DIAGNOSIS — R531 Weakness: Secondary | ICD-10-CM | POA: Diagnosis not present

## 2019-08-25 DIAGNOSIS — C8290 Follicular lymphoma, unspecified, unspecified site: Secondary | ICD-10-CM | POA: Diagnosis not present

## 2019-08-25 DIAGNOSIS — H2512 Age-related nuclear cataract, left eye: Secondary | ICD-10-CM | POA: Diagnosis not present

## 2019-08-25 DIAGNOSIS — R197 Diarrhea, unspecified: Secondary | ICD-10-CM | POA: Diagnosis not present

## 2019-08-25 DIAGNOSIS — M545 Low back pain: Secondary | ICD-10-CM | POA: Diagnosis not present

## 2019-08-25 DIAGNOSIS — Z7952 Long term (current) use of systemic steroids: Secondary | ICD-10-CM | POA: Diagnosis not present

## 2019-08-25 DIAGNOSIS — H2513 Age-related nuclear cataract, bilateral: Secondary | ICD-10-CM | POA: Diagnosis not present

## 2019-08-25 DIAGNOSIS — R110 Nausea: Secondary | ICD-10-CM | POA: Diagnosis not present

## 2019-08-25 DIAGNOSIS — H43391 Other vitreous opacities, right eye: Secondary | ICD-10-CM | POA: Diagnosis not present

## 2019-08-25 DIAGNOSIS — K521 Toxic gastroenteritis and colitis: Secondary | ICD-10-CM | POA: Diagnosis not present

## 2019-08-25 DIAGNOSIS — M4726 Other spondylosis with radiculopathy, lumbar region: Secondary | ICD-10-CM | POA: Diagnosis not present

## 2019-08-25 DIAGNOSIS — E119 Type 2 diabetes mellitus without complications: Secondary | ICD-10-CM | POA: Diagnosis not present

## 2019-08-25 DIAGNOSIS — C8331 Diffuse large B-cell lymphoma, lymph nodes of head, face, and neck: Secondary | ICD-10-CM | POA: Diagnosis not present

## 2019-08-26 DIAGNOSIS — M545 Low back pain: Secondary | ICD-10-CM | POA: Diagnosis not present

## 2019-08-26 DIAGNOSIS — M4726 Other spondylosis with radiculopathy, lumbar region: Secondary | ICD-10-CM | POA: Diagnosis not present

## 2019-08-29 DIAGNOSIS — M4726 Other spondylosis with radiculopathy, lumbar region: Secondary | ICD-10-CM | POA: Diagnosis not present

## 2019-08-29 DIAGNOSIS — M545 Low back pain: Secondary | ICD-10-CM | POA: Diagnosis not present

## 2019-08-30 DIAGNOSIS — M4726 Other spondylosis with radiculopathy, lumbar region: Secondary | ICD-10-CM | POA: Diagnosis not present

## 2019-08-30 DIAGNOSIS — M545 Low back pain: Secondary | ICD-10-CM | POA: Diagnosis not present

## 2019-08-31 DIAGNOSIS — M4726 Other spondylosis with radiculopathy, lumbar region: Secondary | ICD-10-CM | POA: Diagnosis not present

## 2019-08-31 DIAGNOSIS — M545 Low back pain: Secondary | ICD-10-CM | POA: Diagnosis not present

## 2019-09-01 DIAGNOSIS — R300 Dysuria: Secondary | ICD-10-CM | POA: Diagnosis not present

## 2019-09-01 DIAGNOSIS — C8331 Diffuse large B-cell lymphoma, lymph nodes of head, face, and neck: Secondary | ICD-10-CM | POA: Diagnosis not present

## 2019-09-01 DIAGNOSIS — R791 Abnormal coagulation profile: Secondary | ICD-10-CM | POA: Diagnosis not present

## 2019-09-02 DIAGNOSIS — M4726 Other spondylosis with radiculopathy, lumbar region: Secondary | ICD-10-CM | POA: Diagnosis not present

## 2019-09-02 DIAGNOSIS — M545 Low back pain: Secondary | ICD-10-CM | POA: Diagnosis not present

## 2019-09-05 DIAGNOSIS — M4726 Other spondylosis with radiculopathy, lumbar region: Secondary | ICD-10-CM | POA: Diagnosis not present

## 2019-09-05 DIAGNOSIS — M545 Low back pain: Secondary | ICD-10-CM | POA: Diagnosis not present

## 2019-09-06 DIAGNOSIS — H25812 Combined forms of age-related cataract, left eye: Secondary | ICD-10-CM | POA: Diagnosis not present

## 2019-09-06 DIAGNOSIS — H2512 Age-related nuclear cataract, left eye: Secondary | ICD-10-CM | POA: Diagnosis not present

## 2019-09-07 DIAGNOSIS — M4726 Other spondylosis with radiculopathy, lumbar region: Secondary | ICD-10-CM | POA: Diagnosis not present

## 2019-09-07 DIAGNOSIS — M545 Low back pain: Secondary | ICD-10-CM | POA: Diagnosis not present

## 2019-09-08 DIAGNOSIS — J45901 Unspecified asthma with (acute) exacerbation: Secondary | ICD-10-CM | POA: Diagnosis not present

## 2019-09-09 DIAGNOSIS — M4726 Other spondylosis with radiculopathy, lumbar region: Secondary | ICD-10-CM | POA: Diagnosis not present

## 2019-09-09 DIAGNOSIS — M545 Low back pain: Secondary | ICD-10-CM | POA: Diagnosis not present

## 2019-09-12 ENCOUNTER — Encounter: Payer: Self-pay | Admitting: Emergency Medicine

## 2019-09-12 ENCOUNTER — Emergency Department: Payer: PPO

## 2019-09-12 ENCOUNTER — Other Ambulatory Visit: Payer: Self-pay

## 2019-09-12 DIAGNOSIS — F17200 Nicotine dependence, unspecified, uncomplicated: Secondary | ICD-10-CM | POA: Insufficient documentation

## 2019-09-12 DIAGNOSIS — Y998 Other external cause status: Secondary | ICD-10-CM | POA: Diagnosis not present

## 2019-09-12 DIAGNOSIS — M7989 Other specified soft tissue disorders: Secondary | ICD-10-CM | POA: Diagnosis not present

## 2019-09-12 DIAGNOSIS — I10 Essential (primary) hypertension: Secondary | ICD-10-CM | POA: Insufficient documentation

## 2019-09-12 DIAGNOSIS — X58XXXA Exposure to other specified factors, initial encounter: Secondary | ICD-10-CM | POA: Diagnosis not present

## 2019-09-12 DIAGNOSIS — S8012XA Contusion of left lower leg, initial encounter: Secondary | ICD-10-CM | POA: Diagnosis not present

## 2019-09-12 DIAGNOSIS — R600 Localized edema: Secondary | ICD-10-CM | POA: Insufficient documentation

## 2019-09-12 DIAGNOSIS — Y9289 Other specified places as the place of occurrence of the external cause: Secondary | ICD-10-CM | POA: Insufficient documentation

## 2019-09-12 DIAGNOSIS — Z79899 Other long term (current) drug therapy: Secondary | ICD-10-CM | POA: Insufficient documentation

## 2019-09-12 DIAGNOSIS — S8992XA Unspecified injury of left lower leg, initial encounter: Secondary | ICD-10-CM | POA: Diagnosis present

## 2019-09-12 DIAGNOSIS — Y9389 Activity, other specified: Secondary | ICD-10-CM | POA: Insufficient documentation

## 2019-09-12 DIAGNOSIS — R609 Edema, unspecified: Secondary | ICD-10-CM | POA: Diagnosis not present

## 2019-09-12 DIAGNOSIS — S7012XA Contusion of left thigh, initial encounter: Secondary | ICD-10-CM | POA: Diagnosis not present

## 2019-09-12 DIAGNOSIS — E119 Type 2 diabetes mellitus without complications: Secondary | ICD-10-CM | POA: Insufficient documentation

## 2019-09-12 DIAGNOSIS — S7002XA Contusion of left hip, initial encounter: Secondary | ICD-10-CM | POA: Diagnosis not present

## 2019-09-12 LAB — COMPREHENSIVE METABOLIC PANEL
ALT: 31 U/L (ref 0–44)
AST: 23 U/L (ref 15–41)
Albumin: 3.6 g/dL (ref 3.5–5.0)
Alkaline Phosphatase: 109 U/L (ref 38–126)
Anion gap: 9 (ref 5–15)
BUN: 13 mg/dL (ref 8–23)
CO2: 29 mmol/L (ref 22–32)
Calcium: 8.2 mg/dL — ABNORMAL LOW (ref 8.9–10.3)
Chloride: 104 mmol/L (ref 98–111)
Creatinine, Ser: 0.61 mg/dL (ref 0.44–1.00)
GFR calc Af Amer: >60 mL/min (ref >60)
GFR calc non Af Amer: >60 mL/min (ref >60)
Glucose, Bld: 150 mg/dL — ABNORMAL HIGH (ref 70–99)
Potassium: 3.3 mmol/L — ABNORMAL LOW (ref 3.5–5.1)
Sodium: 142 mmol/L (ref 135–145)
Total Bilirubin: 0.5 mg/dL (ref 0.3–1.2)
Total Protein: 5.9 g/dL — ABNORMAL LOW (ref 6.5–8.1)

## 2019-09-12 LAB — CBC
HCT: 30.9 % — ABNORMAL LOW (ref 36.0–46.0)
Hemoglobin: 10.1 g/dL — ABNORMAL LOW (ref 12.0–15.0)
MCH: 30.8 pg (ref 26.0–34.0)
MCHC: 32.7 g/dL (ref 30.0–36.0)
MCV: 94.2 fL (ref 80.0–100.0)
Platelets: 119 10*3/uL — ABNORMAL LOW (ref 150–400)
RBC: 3.28 MIL/uL — ABNORMAL LOW (ref 3.87–5.11)
RDW: 15.5 % (ref 11.5–15.5)
WBC: 5.4 10*3/uL (ref 4.0–10.5)
nRBC: 0 % (ref 0.0–0.2)

## 2019-09-12 IMAGING — US US EXTREM LOW VENOUS*L*
1 series · 14 of 24 positions shown · non-contrast
Comparison: None.

CLINICAL DATA: 69-year-old female with LEFT LOWER extremity
swelling and pain for 4 days.

EXAM:
LEFT LOWER EXTREMITY VENOUS DOPPLER ULTRASOUND
TECHNIQUE: Gray-scale sonography with compression, as well as color and duplex
ultrasound, were performed to evaluate the deep venous system(s)
from the level of the common femoral vein through the popliteal and
proximal calf veins.

[Series 1: us venous img lower uni left (dvt) · portal-venous · 14 of 34 slices shown]
[im 1/34]
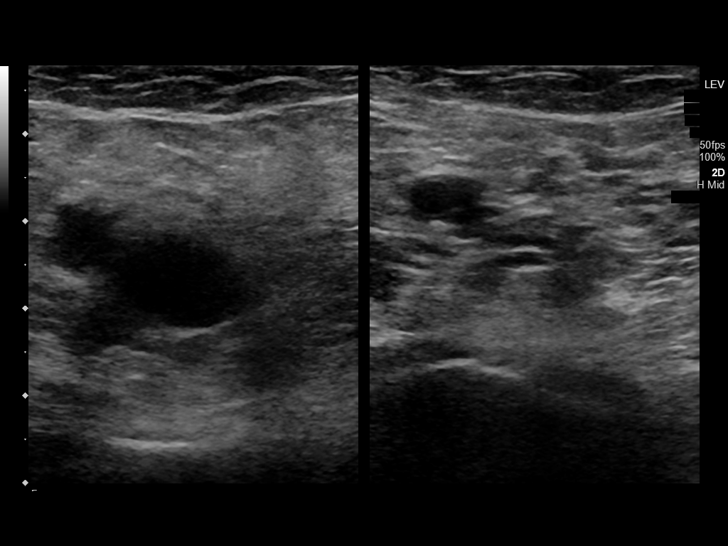
[im 3/34]
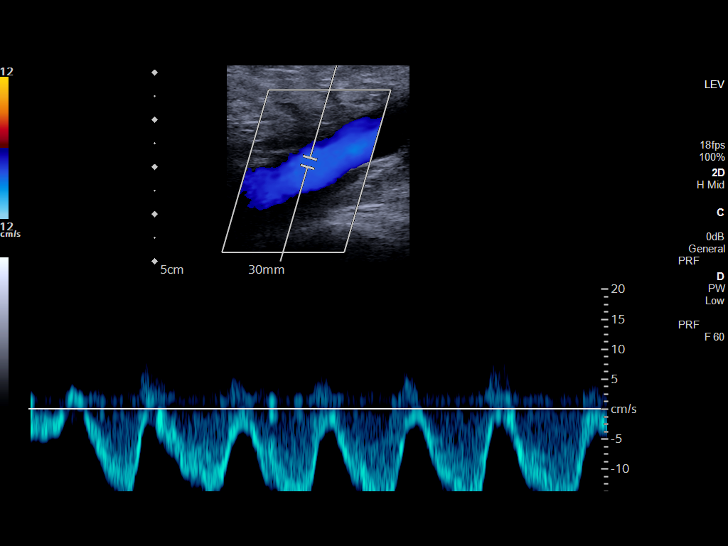
[im 6/34]
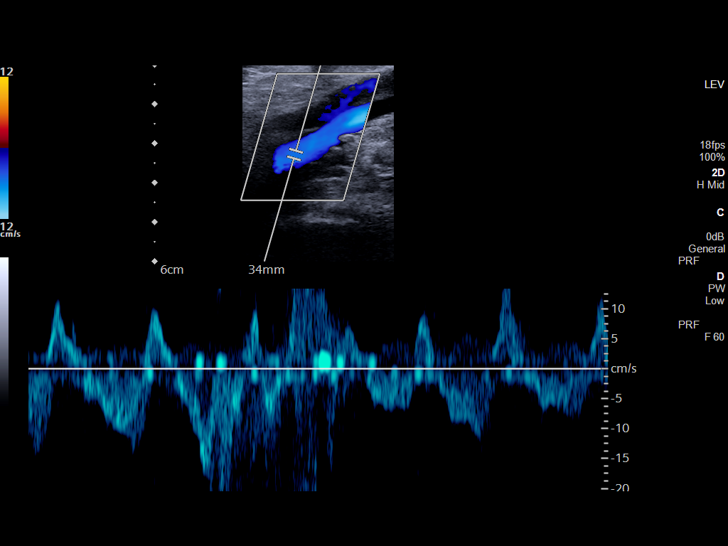
[im 9/34]
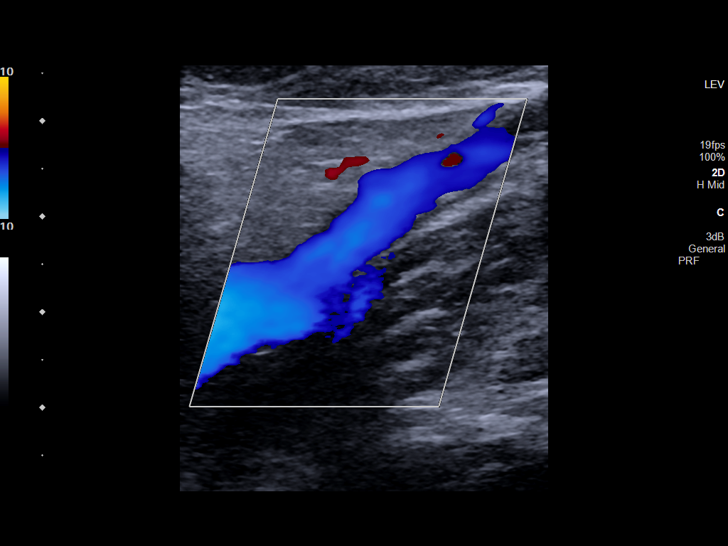
[im 11/34]
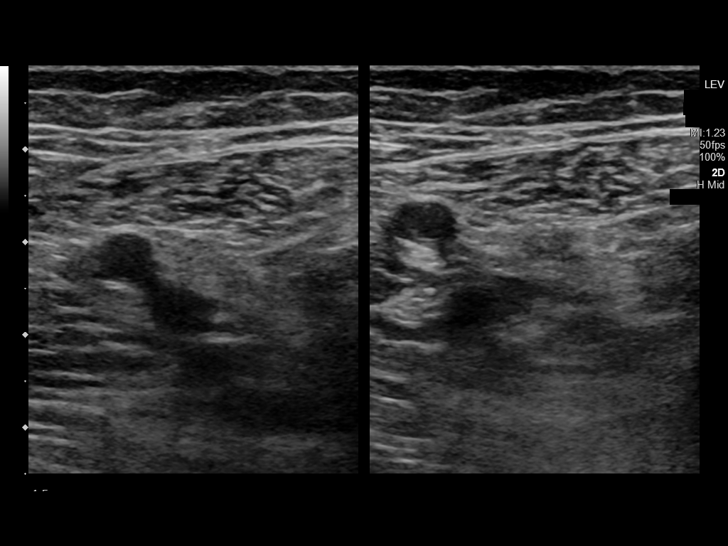
[im 13/34]
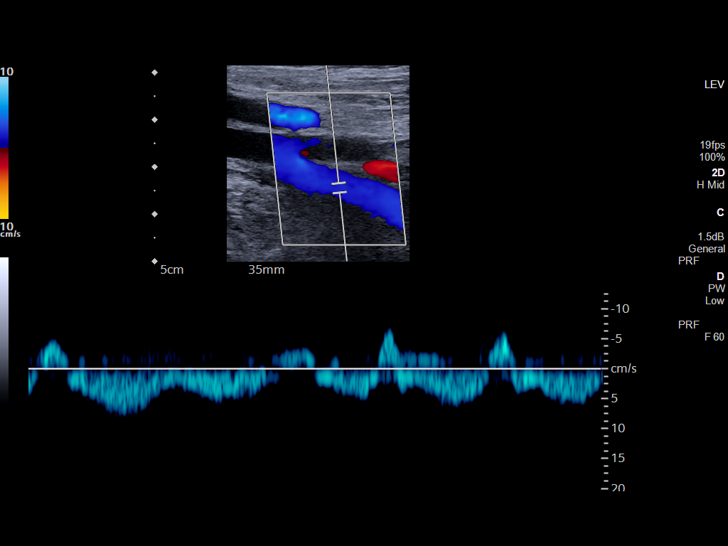
[im 16/34]
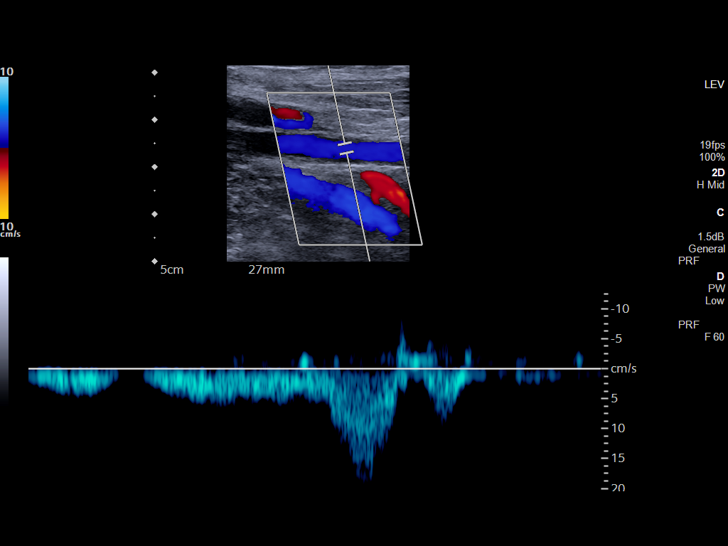
[im 18/34]
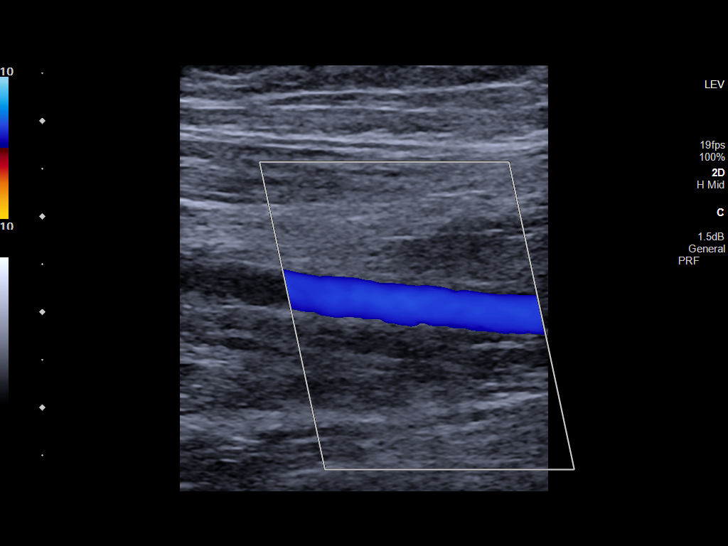
[im 21/34]
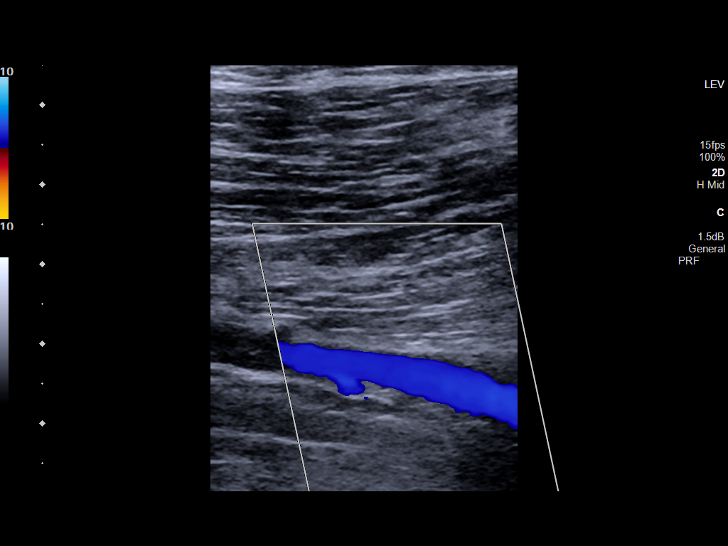
[im 23/34]
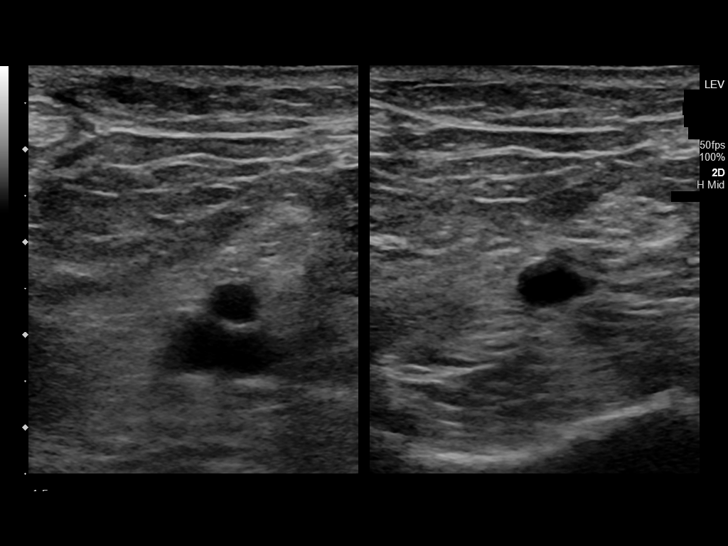
[im 26/34]
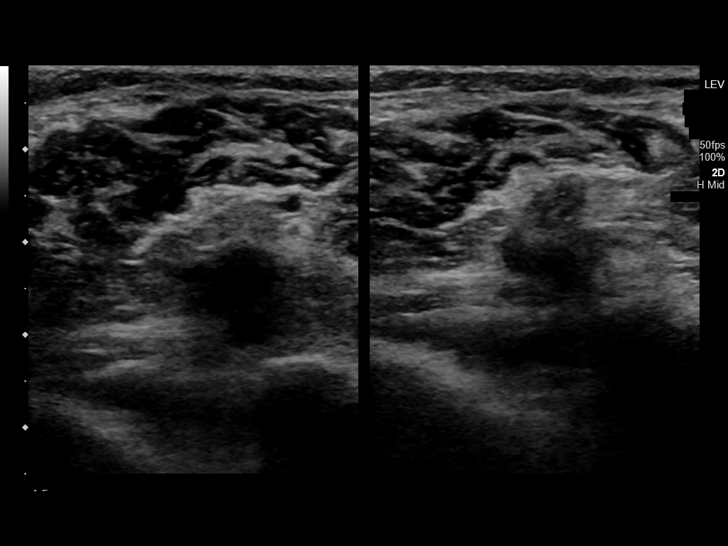
[im 28/34]
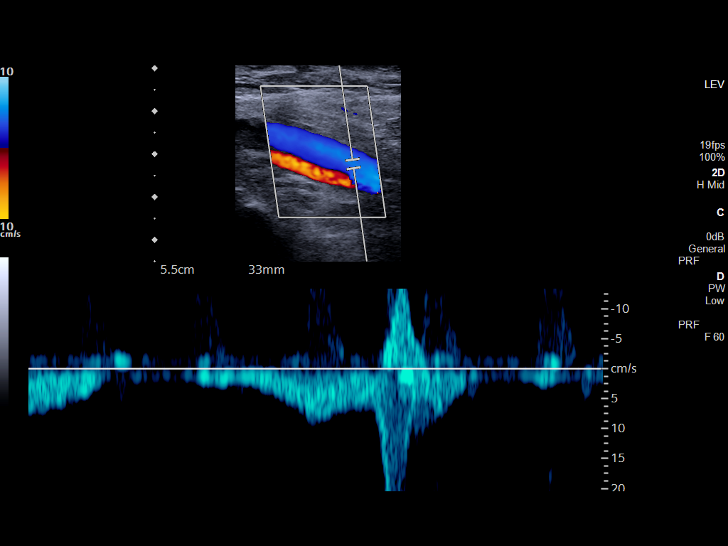
[im 31/34]
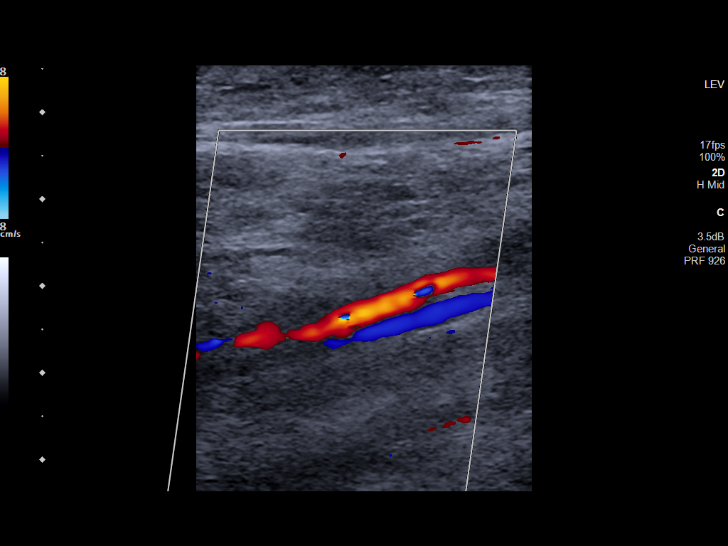
[im 34/34]
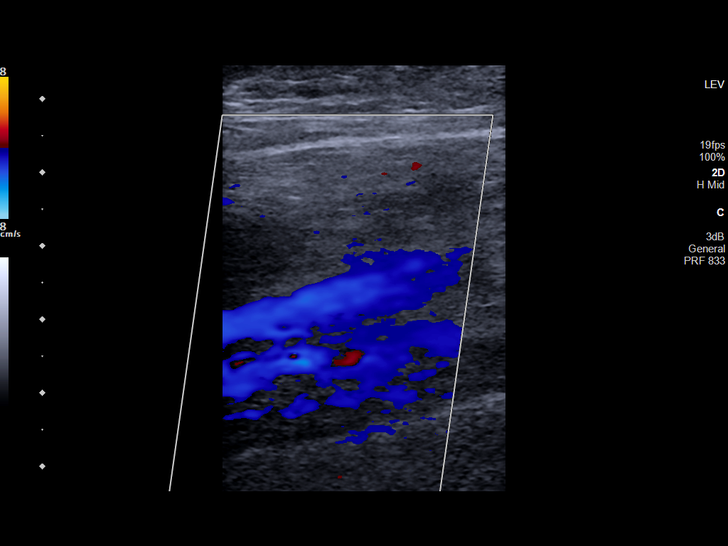

[14 of 24 positions shown; findings below may reference images not displayed]

FINDINGS: VENOUS

Normal compressibility of the common femoral, superficial femoral,
and popliteal veins, as well as the visualized calf veins.
Visualized portions of profunda femoral vein and great saphenous
vein unremarkable. No filling defects to suggest DVT on grayscale or
color Doppler imaging. Doppler waveforms show normal direction of
venous flow, normal respiratory plasticity and response to
augmentation.

Limited views of the contralateral common femoral vein are
unremarkable.

OTHER

Subcutaneous edema is noted.

Limitations: none
IMPRESSION: No evidence of DVT.

Subcutaneous edema.

## 2019-09-12 NOTE — ED Triage Notes (Signed)
Pt sent for possible blood clot.  Swelling to LLE for past couple days.  Significant discoloration to foot.  Foot is warm and dry.  Left DP pulse WNL.

## 2019-09-13 ENCOUNTER — Emergency Department: Payer: PPO

## 2019-09-13 ENCOUNTER — Emergency Department
Admission: EM | Admit: 2019-09-13 | Discharge: 2019-09-13 | Disposition: A | Discharge status: Home / Self Care | Payer: PPO | Attending: Emergency Medicine | Admitting: Emergency Medicine

## 2019-09-13 ENCOUNTER — Encounter: Payer: Self-pay | Admitting: Radiology

## 2019-09-13 DIAGNOSIS — S8012XA Contusion of left lower leg, initial encounter: Secondary | ICD-10-CM | POA: Diagnosis not present

## 2019-09-13 DIAGNOSIS — S7012XA Contusion of left thigh, initial encounter: Secondary | ICD-10-CM | POA: Diagnosis not present

## 2019-09-13 DIAGNOSIS — T148XXA Other injury of unspecified body region, initial encounter: Secondary | ICD-10-CM

## 2019-09-13 DIAGNOSIS — C8331 Diffuse large B-cell lymphoma, lymph nodes of head, face, and neck: Secondary | ICD-10-CM | POA: Diagnosis not present

## 2019-09-13 DIAGNOSIS — I749 Embolism and thrombosis of unspecified artery: Secondary | ICD-10-CM | POA: Diagnosis not present

## 2019-09-13 DIAGNOSIS — M7989 Other specified soft tissue disorders: Secondary | ICD-10-CM | POA: Diagnosis not present

## 2019-09-13 DIAGNOSIS — R609 Edema, unspecified: Secondary | ICD-10-CM

## 2019-09-13 LAB — CBC WITH DIFFERENTIAL/PLATELET
Abs Immature Granulocytes: 0.04 10*3/uL (ref 0.00–0.07)
Basophils Absolute: 0 10*3/uL (ref 0.0–0.1)
Basophils Relative: 0 %
Eosinophils Absolute: 0 10*3/uL (ref 0.0–0.5)
Eosinophils Relative: 0 %
HCT: 31.3 % — ABNORMAL LOW (ref 36.0–46.0)
Hemoglobin: 10.2 g/dL — ABNORMAL LOW (ref 12.0–15.0)
Immature Granulocytes: 1 %
Lymphocytes Relative: 42 %
Lymphs Abs: 2.3 10*3/uL (ref 0.7–4.0)
MCH: 30.8 pg (ref 26.0–34.0)
MCHC: 32.6 g/dL (ref 30.0–36.0)
MCV: 94.6 fL (ref 80.0–100.0)
Monocytes Absolute: 0.4 10*3/uL (ref 0.1–1.0)
Monocytes Relative: 7 %
Neutro Abs: 2.8 10*3/uL (ref 1.7–7.7)
Neutrophils Relative %: 50 %
Platelets: 116 10*3/uL — ABNORMAL LOW (ref 150–400)
RBC: 3.31 MIL/uL — ABNORMAL LOW (ref 3.87–5.11)
RDW: 15.8 % — ABNORMAL HIGH (ref 11.5–15.5)
WBC: 5.6 10*3/uL (ref 4.0–10.5)
nRBC: 0 % (ref 0.0–0.2)

## 2019-09-13 LAB — CBC
HCT: 30.8 % — ABNORMAL LOW (ref 36.0–46.0)
Hemoglobin: 10.1 g/dL — ABNORMAL LOW (ref 12.0–15.0)
MCH: 31 pg (ref 26.0–34.0)
MCHC: 32.8 g/dL (ref 30.0–36.0)
MCV: 94.5 fL (ref 80.0–100.0)
Platelets: 114 10*3/uL — ABNORMAL LOW (ref 150–400)
RBC: 3.26 MIL/uL — ABNORMAL LOW (ref 3.87–5.11)
RDW: 15.6 % — ABNORMAL HIGH (ref 11.5–15.5)
WBC: 5.5 10*3/uL (ref 4.0–10.5)
nRBC: 0 % (ref 0.0–0.2)

## 2019-09-13 IMAGING — CT CT EXTREM LOW W/ CM*L*
2 of 3 series · 10 of 46 positions shown, 11 images · IV contrast (agent unspecified)
Comparison: None similar and available.

CONTRAST:  100mL OMNIPAQUE IOHEXOL 300 MG/ML  SOLN

CLINICAL DATA: Soft tissue bruising

EXAM:
CT OF THE LOWER LEFT EXTREMITY WITH CONTRAST
TECHNIQUE: Multidetector CT imaging of the lower left extremity was performed
according to the standard protocol following intravenous contrast
administration.

[Series 7: axial st · axial · 0.44mm/px · z∈[+64,+828]mm · 7 of 631 slices shown, 8 images]
[im 61/631  soft-tissue]
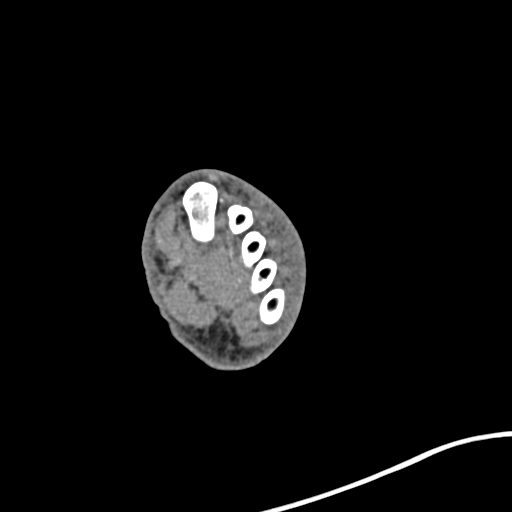
[im 61/631  bone]
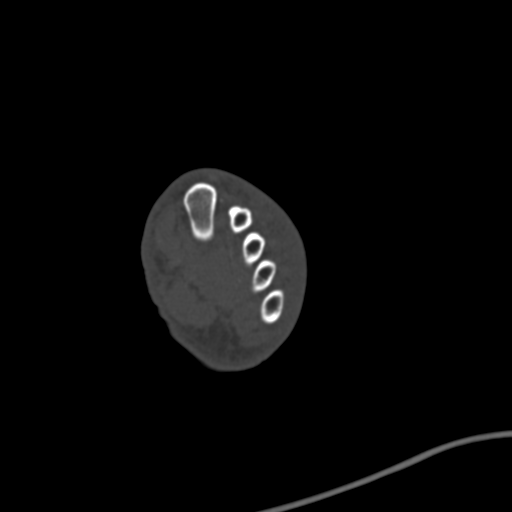
[im 143/631  soft-tissue]
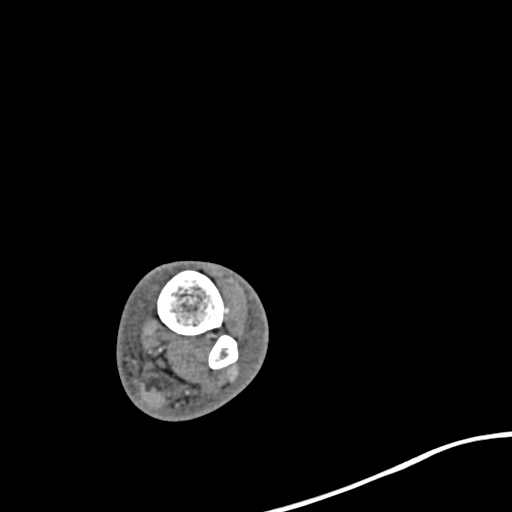
[im 224/631  soft-tissue]
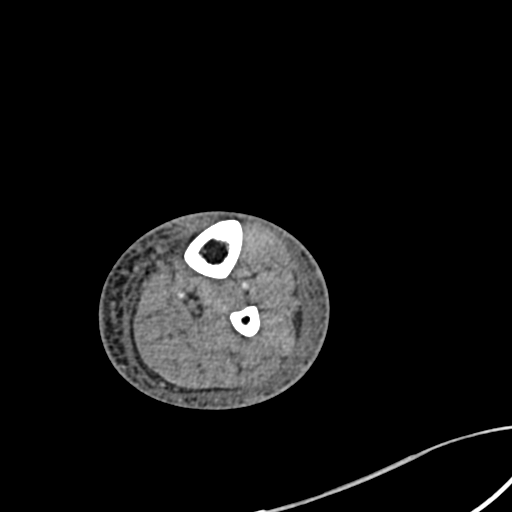
[im 326/631  soft-tissue]
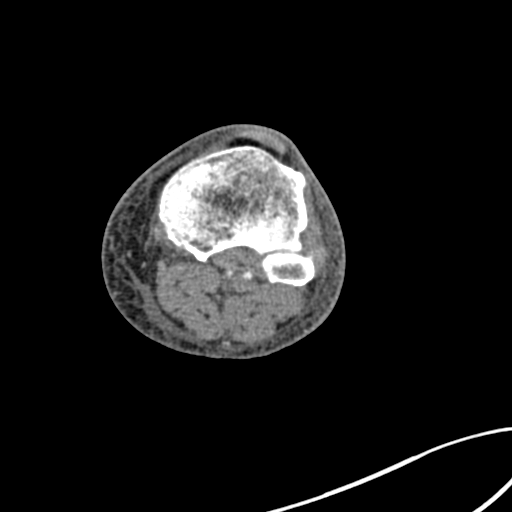
[im 407/631  soft-tissue]
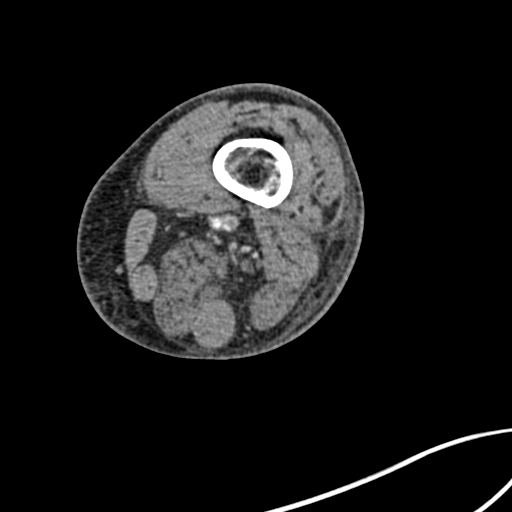
[im 488/631  soft-tissue]
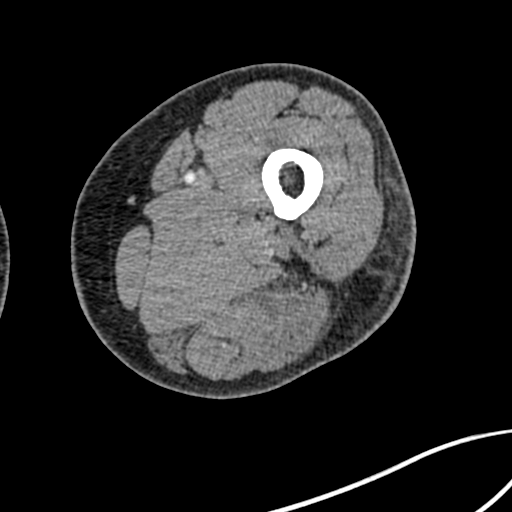
[im 570/631  soft-tissue]
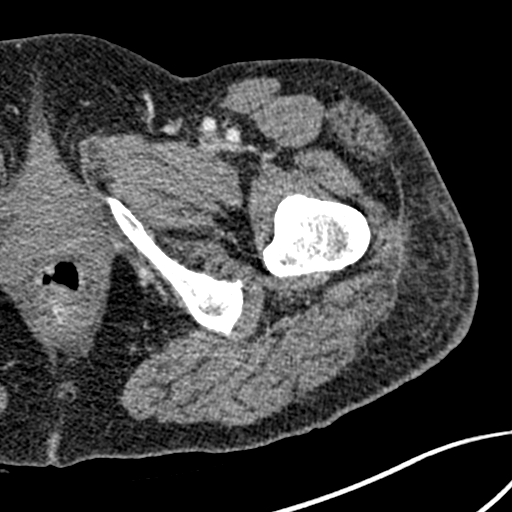

[Series 10: coronal st · coronal · 0.48mm/px · 3 of 125 slices shown]
[im 42/125  soft-tissue]
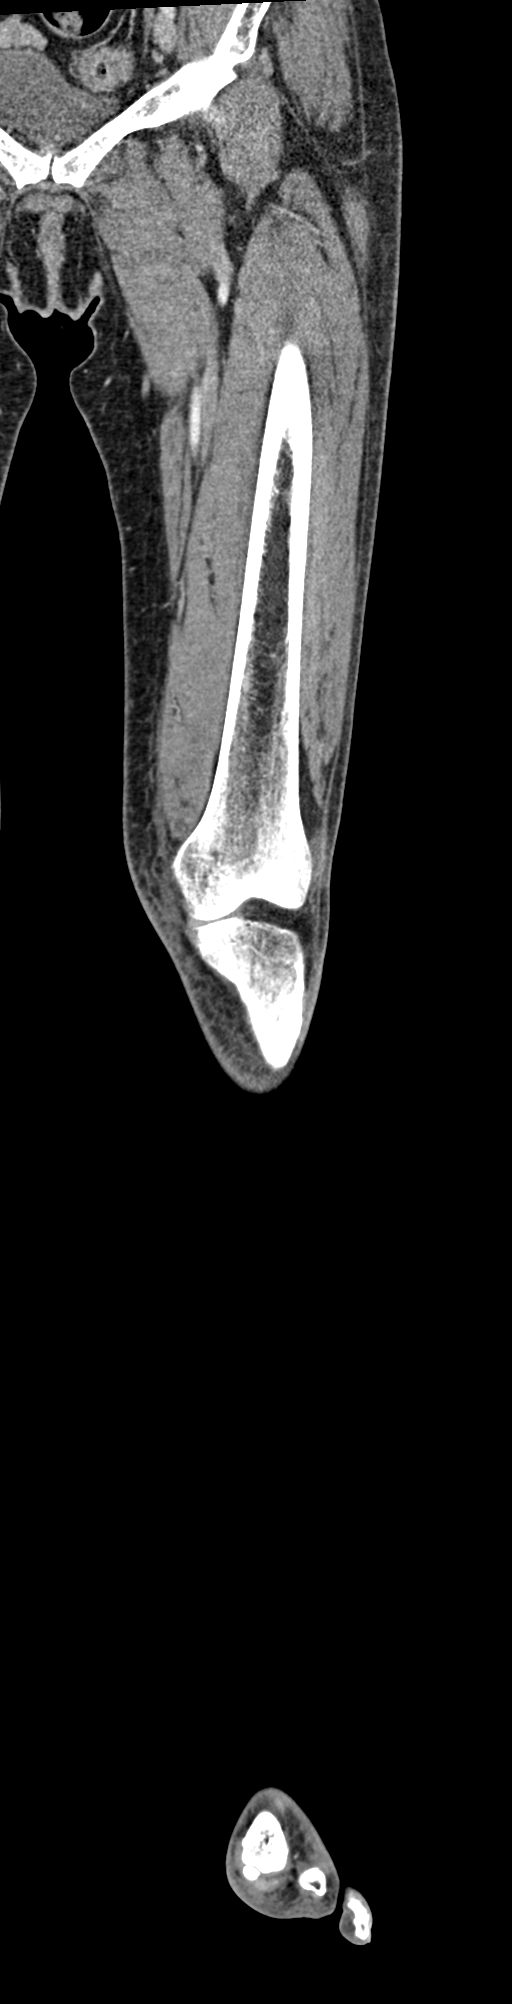
[im 56/125  soft-tissue]
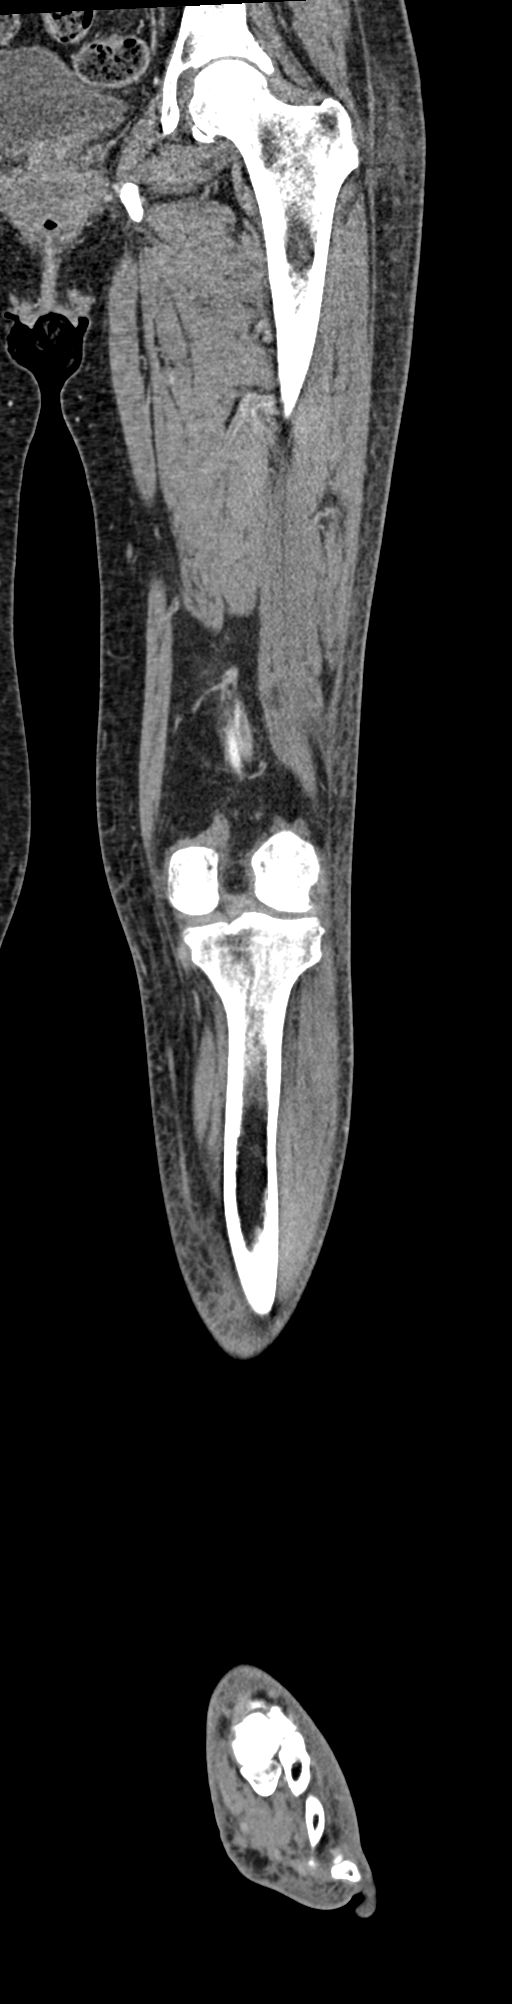
[im 69/125  soft-tissue]
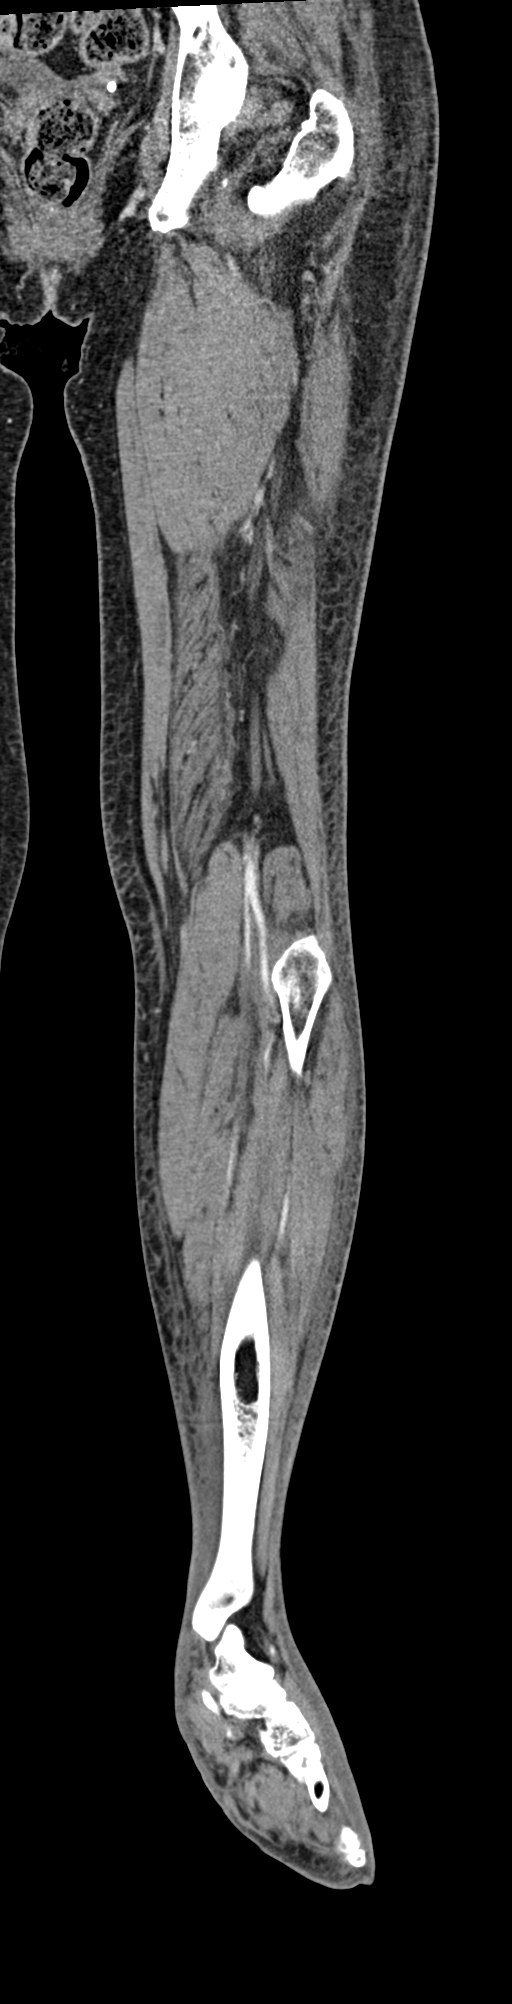

[10 of 46 positions shown; findings below may reference images not displayed]

FINDINGS: Bones/Joint/Cartilage

Negative for fracture, subluxation, or bone lesion. Generalized
osteopenic appearance. Negative for joint effusions.

Ligaments

Suboptimally assessed by CT.

Muscles and Tendons

Mild expansion and patchy low-density appearance of the upper
hamstring with more discrete low-density collection type appearance
inferiorly, latter portion measuring 7 x 1.5 cm. This was not
mentioned on an outside PET CT from [facility] [DATE], but there
is mention of hamstring tear and tendinosis on pelvis MRI
[DATE].

Soft tissues

Subcutaneous reticulation throughout the calf and foot without
discrete collection or abnormal enhancement.
IMPRESSION: 1. Muscular low-density and mild expansion in the upper to mid left
hamstring that correlates with outside pelvis MRI report of tear and
tendinosis at the left greater than right hamstring. More inferiorly
there is a discrete intramuscular collection likely reflecting
organized hematoma. History of follicular lymphoma with muscular
involvement, no report of hypermetabolism in this area on recent
PET-CT.
2. Generalized subcutaneous swelling.

## 2019-09-13 MED ORDER — IOHEXOL 300 MG/ML  SOLN
100.0000 mL | Freq: Once | INTRAMUSCULAR | Status: AC | PRN
  Administered 2019-09-13: 100 mL via INTRAVENOUS

## 2019-09-13 NOTE — Discharge Instructions (Addendum)
As we discussed, your work-up tonight was quite reassuring.  Even though your platelet count is a bit low, it is relatively consistent with prior values at 114,000.  Your hemoglobin was 10, and your metabolic panel was normal.  There was no evidence of DVT on the ultrasound of your left leg, and given the amount of swelling and bruising you are experiencing, we proceeded with a CT scan of your left leg with IV contrast to look for any sign of large hematoma or active bleeding.  Fortunately that was reassuring as well and only showed the abnormality of your hamstring that corresponds to the imaging obtained at [facility].  We recommend you continue taking your regular medications and follow-up with your doctors at [facility] at the next available opportunity.  Return to the emergency department if you develop new or worsening symptoms that concern you.
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
## 2019-09-14 DIAGNOSIS — H2512 Age-related nuclear cataract, left eye: Secondary | ICD-10-CM | POA: Diagnosis not present

## 2019-09-15 DIAGNOSIS — K529 Noninfective gastroenteritis and colitis, unspecified: Secondary | ICD-10-CM | POA: Diagnosis not present

## 2019-09-15 DIAGNOSIS — Z7952 Long term (current) use of systemic steroids: Secondary | ICD-10-CM | POA: Diagnosis not present

## 2019-09-15 DIAGNOSIS — M545 Low back pain: Secondary | ICD-10-CM | POA: Diagnosis not present

## 2019-09-15 DIAGNOSIS — C8290 Follicular lymphoma, unspecified, unspecified site: Secondary | ICD-10-CM | POA: Diagnosis not present

## 2019-09-15 DIAGNOSIS — Z9221 Personal history of antineoplastic chemotherapy: Secondary | ICD-10-CM | POA: Diagnosis not present

## 2019-09-15 DIAGNOSIS — R531 Weakness: Secondary | ICD-10-CM | POA: Diagnosis not present

## 2019-09-15 DIAGNOSIS — C8330 Diffuse large B-cell lymphoma, unspecified site: Secondary | ICD-10-CM | POA: Diagnosis not present

## 2019-09-15 DIAGNOSIS — Z9484 Stem cells transplant status: Secondary | ICD-10-CM | POA: Diagnosis not present

## 2019-09-15 DIAGNOSIS — M4726 Other spondylosis with radiculopathy, lumbar region: Secondary | ICD-10-CM | POA: Diagnosis not present

## 2019-09-15 DIAGNOSIS — Z7409 Other reduced mobility: Secondary | ICD-10-CM | POA: Diagnosis not present

## 2019-09-22 DIAGNOSIS — M545 Low back pain: Secondary | ICD-10-CM | POA: Diagnosis not present

## 2019-09-22 DIAGNOSIS — M4726 Other spondylosis with radiculopathy, lumbar region: Secondary | ICD-10-CM | POA: Diagnosis not present

## 2019-09-23 DIAGNOSIS — M545 Low back pain: Secondary | ICD-10-CM | POA: Diagnosis not present

## 2019-09-23 DIAGNOSIS — M4726 Other spondylosis with radiculopathy, lumbar region: Secondary | ICD-10-CM | POA: Diagnosis not present

## 2019-09-26 DIAGNOSIS — U071 COVID-19: Secondary | ICD-10-CM | POA: Diagnosis not present

## 2019-09-26 DIAGNOSIS — E876 Hypokalemia: Secondary | ICD-10-CM | POA: Diagnosis not present

## 2019-09-26 DIAGNOSIS — C8331 Diffuse large B-cell lymphoma, lymph nodes of head, face, and neck: Secondary | ICD-10-CM | POA: Diagnosis not present

## 2019-09-26 DIAGNOSIS — Z9484 Stem cells transplant status: Secondary | ICD-10-CM | POA: Diagnosis not present

## 2019-09-26 DIAGNOSIS — Z79899 Other long term (current) drug therapy: Secondary | ICD-10-CM | POA: Diagnosis not present

## 2019-09-26 DIAGNOSIS — R531 Weakness: Secondary | ICD-10-CM | POA: Diagnosis not present

## 2019-09-26 DIAGNOSIS — R197 Diarrhea, unspecified: Secondary | ICD-10-CM | POA: Diagnosis not present

## 2019-09-27 DIAGNOSIS — U071 COVID-19: Secondary | ICD-10-CM | POA: Diagnosis not present

## 2019-09-27 DIAGNOSIS — M1712 Unilateral primary osteoarthritis, left knee: Secondary | ICD-10-CM | POA: Diagnosis not present

## 2019-09-27 DIAGNOSIS — M7652 Patellar tendinitis, left knee: Secondary | ICD-10-CM | POA: Diagnosis not present

## 2019-09-27 DIAGNOSIS — Z8719 Personal history of other diseases of the digestive system: Secondary | ICD-10-CM | POA: Diagnosis not present

## 2019-09-27 DIAGNOSIS — F17210 Nicotine dependence, cigarettes, uncomplicated: Secondary | ICD-10-CM | POA: Diagnosis not present

## 2019-09-27 DIAGNOSIS — J449 Chronic obstructive pulmonary disease, unspecified: Secondary | ICD-10-CM | POA: Diagnosis not present

## 2019-09-27 DIAGNOSIS — M7989 Other specified soft tissue disorders: Secondary | ICD-10-CM | POA: Diagnosis not present

## 2019-09-27 DIAGNOSIS — Z8679 Personal history of other diseases of the circulatory system: Secondary | ICD-10-CM | POA: Diagnosis not present

## 2019-09-27 DIAGNOSIS — C8290 Follicular lymphoma, unspecified, unspecified site: Secondary | ICD-10-CM | POA: Diagnosis not present

## 2019-09-27 DIAGNOSIS — M79662 Pain in left lower leg: Secondary | ICD-10-CM | POA: Diagnosis not present

## 2019-09-29 ENCOUNTER — Inpatient Hospital Stay
Admission: EM | Admit: 2019-09-29 | Discharge: 2019-10-02 | DRG: 871 | Disposition: A | Discharge status: Home Health | Payer: PPO | Attending: Family Medicine | Admitting: Family Medicine

## 2019-09-29 ENCOUNTER — Other Ambulatory Visit: Payer: Self-pay

## 2019-09-29 ENCOUNTER — Encounter: Payer: Self-pay | Admitting: Emergency Medicine

## 2019-09-29 ENCOUNTER — Emergency Department: Payer: PPO

## 2019-09-29 DIAGNOSIS — Z9221 Personal history of antineoplastic chemotherapy: Secondary | ICD-10-CM

## 2019-09-29 DIAGNOSIS — Z90710 Acquired absence of both cervix and uterus: Secondary | ICD-10-CM | POA: Diagnosis not present

## 2019-09-29 DIAGNOSIS — U071 COVID-19: Secondary | ICD-10-CM | POA: Diagnosis not present

## 2019-09-29 DIAGNOSIS — R000 Tachycardia, unspecified: Secondary | ICD-10-CM | POA: Diagnosis not present

## 2019-09-29 DIAGNOSIS — R52 Pain, unspecified: Secondary | ICD-10-CM

## 2019-09-29 DIAGNOSIS — I10 Essential (primary) hypertension: Secondary | ICD-10-CM | POA: Diagnosis present

## 2019-09-29 DIAGNOSIS — A414 Sepsis due to anaerobes: Principal | ICD-10-CM | POA: Diagnosis present

## 2019-09-29 DIAGNOSIS — G459 Transient cerebral ischemic attack, unspecified: Secondary | ICD-10-CM

## 2019-09-29 DIAGNOSIS — R531 Weakness: Secondary | ICD-10-CM | POA: Diagnosis not present

## 2019-09-29 DIAGNOSIS — G9341 Metabolic encephalopathy: Secondary | ICD-10-CM | POA: Diagnosis not present

## 2019-09-29 DIAGNOSIS — R4020 Unspecified coma: Secondary | ICD-10-CM | POA: Diagnosis not present

## 2019-09-29 DIAGNOSIS — G9340 Encephalopathy, unspecified: Secondary | ICD-10-CM | POA: Diagnosis not present

## 2019-09-29 DIAGNOSIS — Z79899 Other long term (current) drug therapy: Secondary | ICD-10-CM

## 2019-09-29 DIAGNOSIS — F17210 Nicotine dependence, cigarettes, uncomplicated: Secondary | ICD-10-CM | POA: Diagnosis present

## 2019-09-29 DIAGNOSIS — C8590 Non-Hodgkin lymphoma, unspecified, unspecified site: Secondary | ICD-10-CM | POA: Diagnosis present

## 2019-09-29 DIAGNOSIS — M7989 Other specified soft tissue disorders: Secondary | ICD-10-CM | POA: Diagnosis not present

## 2019-09-29 DIAGNOSIS — R5383 Other fatigue: Secondary | ICD-10-CM | POA: Diagnosis not present

## 2019-09-29 DIAGNOSIS — J984 Other disorders of lung: Secondary | ICD-10-CM | POA: Diagnosis not present

## 2019-09-29 DIAGNOSIS — I700 Atherosclerosis of aorta: Secondary | ICD-10-CM | POA: Diagnosis not present

## 2019-09-29 DIAGNOSIS — R9082 White matter disease, unspecified: Secondary | ICD-10-CM | POA: Diagnosis not present

## 2019-09-29 DIAGNOSIS — M79641 Pain in right hand: Secondary | ICD-10-CM | POA: Diagnosis not present

## 2019-09-29 DIAGNOSIS — R4182 Altered mental status, unspecified: Secondary | ICD-10-CM | POA: Diagnosis not present

## 2019-09-29 DIAGNOSIS — Z8572 Personal history of non-Hodgkin lymphomas: Secondary | ICD-10-CM | POA: Diagnosis not present

## 2019-09-29 DIAGNOSIS — E782 Mixed hyperlipidemia: Secondary | ICD-10-CM | POA: Diagnosis not present

## 2019-09-29 DIAGNOSIS — R6510 Systemic inflammatory response syndrome (SIRS) of non-infectious origin without acute organ dysfunction: Secondary | ICD-10-CM

## 2019-09-29 DIAGNOSIS — E119 Type 2 diabetes mellitus without complications: Secondary | ICD-10-CM | POA: Diagnosis not present

## 2019-09-29 DIAGNOSIS — A0472 Enterocolitis due to Clostridium difficile, not specified as recurrent: Secondary | ICD-10-CM

## 2019-09-29 DIAGNOSIS — J432 Centrilobular emphysema: Secondary | ICD-10-CM | POA: Diagnosis not present

## 2019-09-29 DIAGNOSIS — I6523 Occlusion and stenosis of bilateral carotid arteries: Secondary | ICD-10-CM | POA: Diagnosis not present

## 2019-09-29 DIAGNOSIS — R5381 Other malaise: Secondary | ICD-10-CM | POA: Diagnosis not present

## 2019-09-29 DIAGNOSIS — E876 Hypokalemia: Secondary | ICD-10-CM | POA: Diagnosis not present

## 2019-09-29 DIAGNOSIS — G319 Degenerative disease of nervous system, unspecified: Secondary | ICD-10-CM | POA: Diagnosis not present

## 2019-09-29 HISTORY — DX: COVID-19: U07.1

## 2019-09-29 LAB — BASIC METABOLIC PANEL
Anion gap: 10 (ref 5–15)
BUN: 8 mg/dL (ref 8–23)
CO2: 24 mmol/L (ref 22–32)
Calcium: 8.3 mg/dL — ABNORMAL LOW (ref 8.9–10.3)
Chloride: 109 mmol/L (ref 98–111)
Creatinine, Ser: 0.65 mg/dL (ref 0.44–1.00)
GFR calc Af Amer: >60 mL/min (ref >60)
GFR calc non Af Amer: >60 mL/min (ref >60)
Glucose, Bld: 109 mg/dL — ABNORMAL HIGH (ref 70–99)
Potassium: 2.7 mmol/L — CL (ref 3.5–5.1)
Sodium: 143 mmol/L (ref 135–145)

## 2019-09-29 LAB — TROPONIN I (HIGH SENSITIVITY)
Troponin I (High Sensitivity): 8 ng/L (ref <18)
Troponin I (High Sensitivity): 11 ng/L (ref <18)

## 2019-09-29 LAB — CBC
HCT: 33 % — ABNORMAL LOW (ref 36.0–46.0)
Hemoglobin: 10.9 g/dL — ABNORMAL LOW (ref 12.0–15.0)
MCH: 30.6 pg (ref 26.0–34.0)
MCHC: 33 g/dL (ref 30.0–36.0)
MCV: 92.7 fL (ref 80.0–100.0)
Platelets: 139 10*3/uL — ABNORMAL LOW (ref 150–400)
RBC: 3.56 MIL/uL — ABNORMAL LOW (ref 3.87–5.11)
RDW: 15.9 % — ABNORMAL HIGH (ref 11.5–15.5)
WBC: 6.9 10*3/uL (ref 4.0–10.5)
nRBC: 0 % (ref 0.0–0.2)

## 2019-09-29 LAB — BLOOD GAS, VENOUS
Acid-base deficit: 0.8 mmol/L (ref 0.0–2.0)
Bicarbonate: 24.3 mmol/L (ref 20.0–28.0)
O2 Saturation: 67.6 %
Patient temperature: 37
pCO2, Ven: 41 mmHg — ABNORMAL LOW (ref 44.0–60.0)
pH, Ven: 7.38 (ref 7.250–7.430)
pO2, Ven: 36 mmHg (ref 32.0–45.0)

## 2019-09-29 LAB — URINALYSIS, ROUTINE W REFLEX MICROSCOPIC
Bacteria, UA: NONE SEEN
Bilirubin Urine: NEGATIVE
Glucose, UA: NEGATIVE mg/dL
Ketones, ur: NEGATIVE mg/dL
Leukocytes,Ua: NEGATIVE
Nitrite: NEGATIVE
Protein, ur: NEGATIVE mg/dL
Specific Gravity, Urine: 1.035 — ABNORMAL HIGH (ref 1.005–1.030)
pH: 5 (ref 5.0–8.0)

## 2019-09-29 LAB — MAGNESIUM: Magnesium: 1.8 mg/dL (ref 1.7–2.4)

## 2019-09-29 IMAGING — CT CT HEAD W/O CM
3 series · 14 of 45 positions shown, 16 images · non-contrast
Comparison: [DATE]

CLINICAL DATA: Altered mental status

EXAM:
CT HEAD WITHOUT CONTRAST
TECHNIQUE: Contiguous axial images were obtained from the base of the skull
through the vertex without intravenous contrast.

[Series 2: head wo · axial · 0.45mm/px · z∈[-117,-2]mm · 8 of 28 slices shown, 10 images]
[im 3/28  brain]
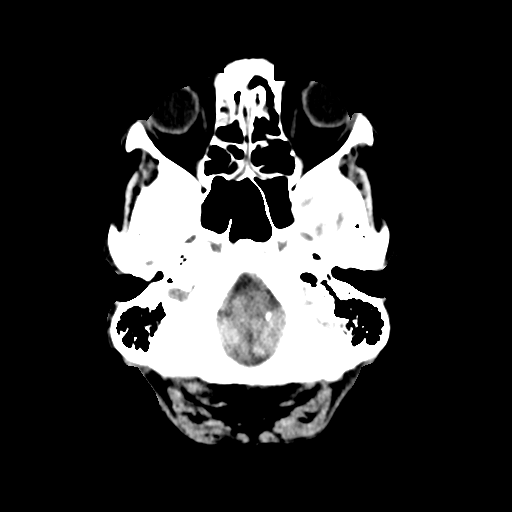
[im 3/28  bone]
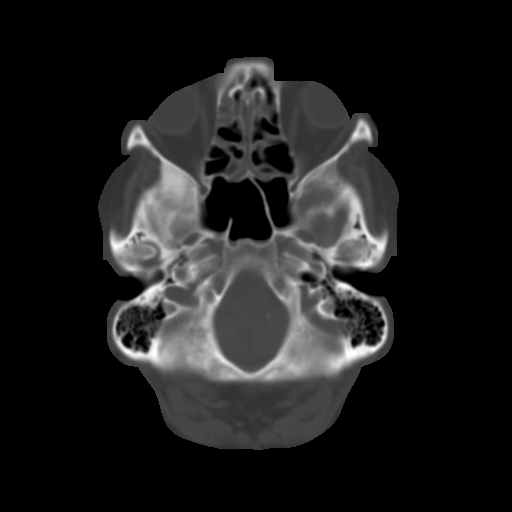
[im 6/28  brain]
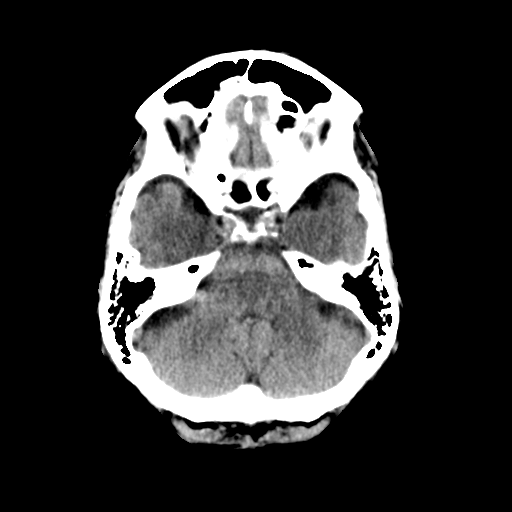
[im 10/28  brain]
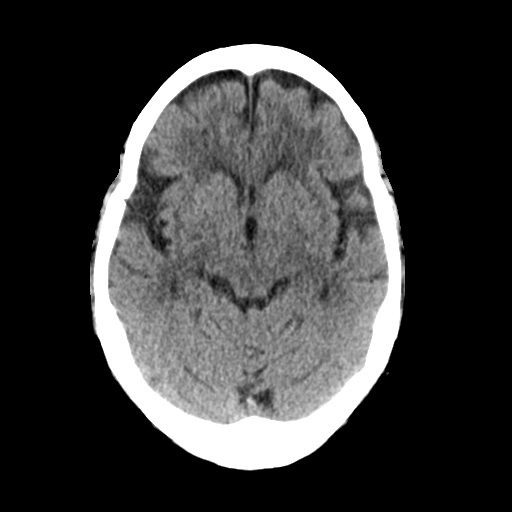
[im 13/28  brain]
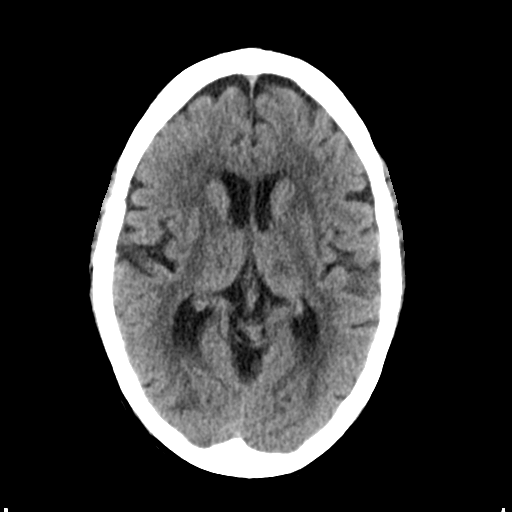
[im 16/28  brain]
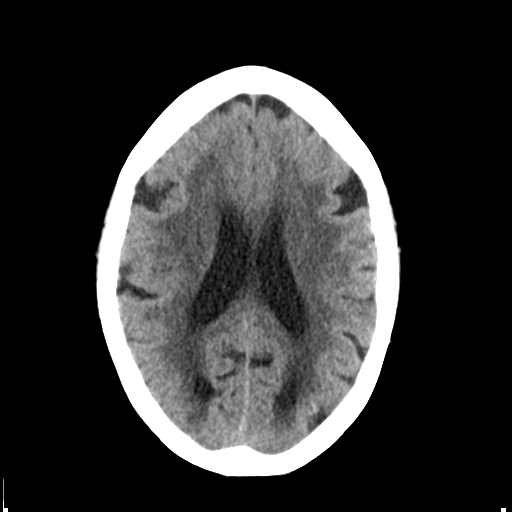
[im 16/28  bone]
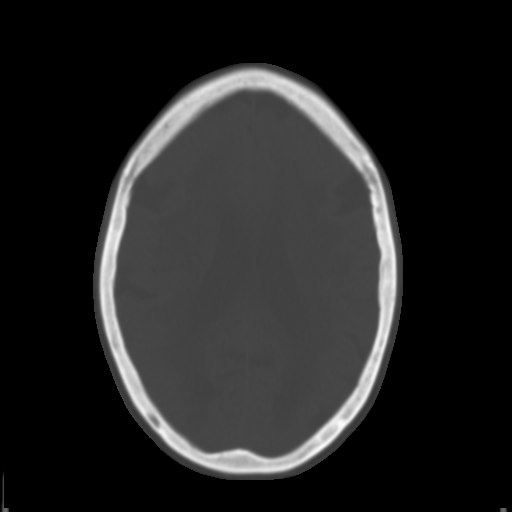
[im 19/28  brain]
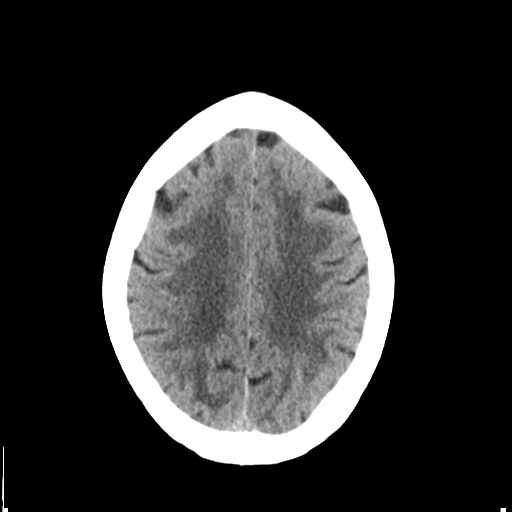
[im 23/28  brain]
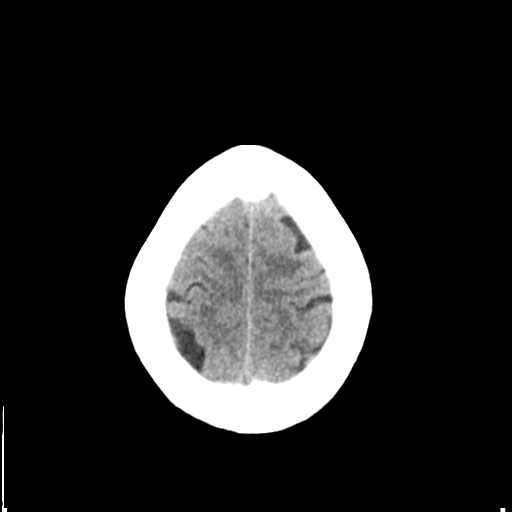
[im 26/28  brain]
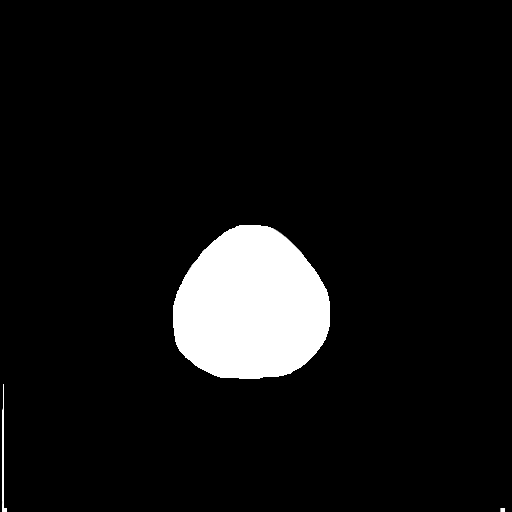

[Series 4: coronal soft tissue · coronal · 0.27mm/px · 3 of 64 slices shown]
[im 22/64  brain]
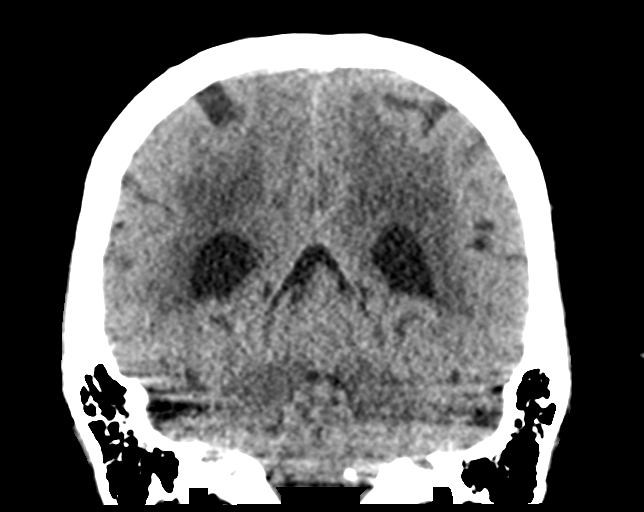
[im 29/64  brain]
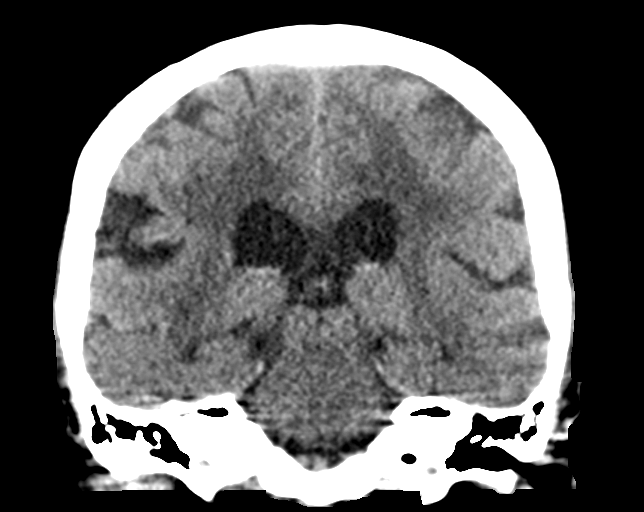
[im 36/64  brain]
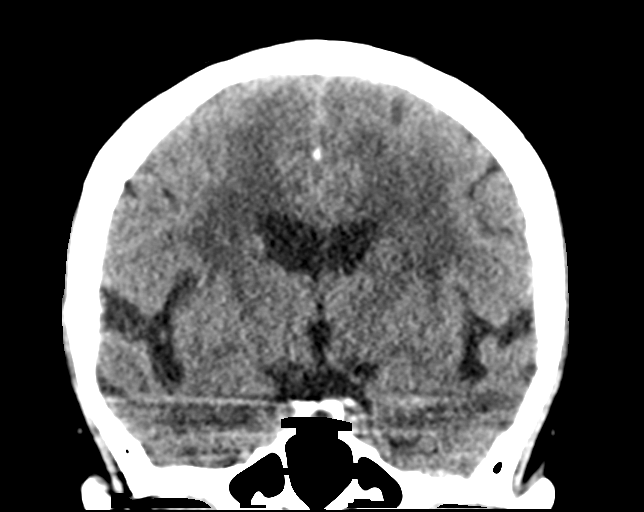

[Series 5: sagittal soft tissue · sagittal · 0.28mm/px · 3 of 49 slices shown]
[im 17/49  brain]
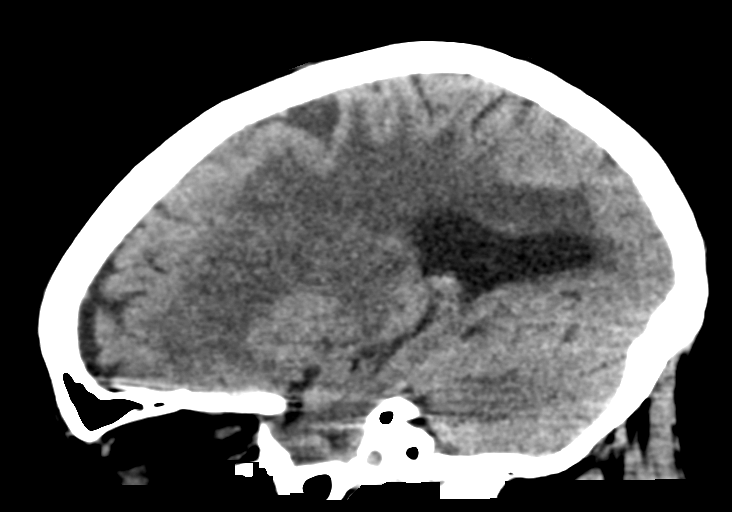
[im 25/49  brain]
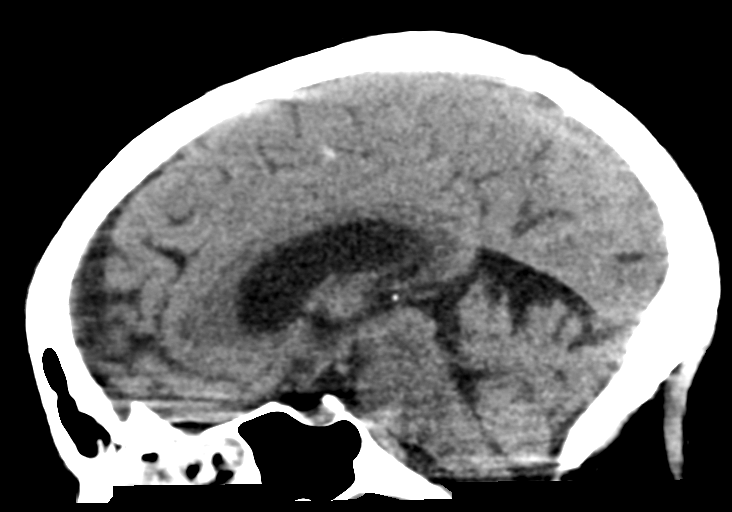
[im 33/49  brain]
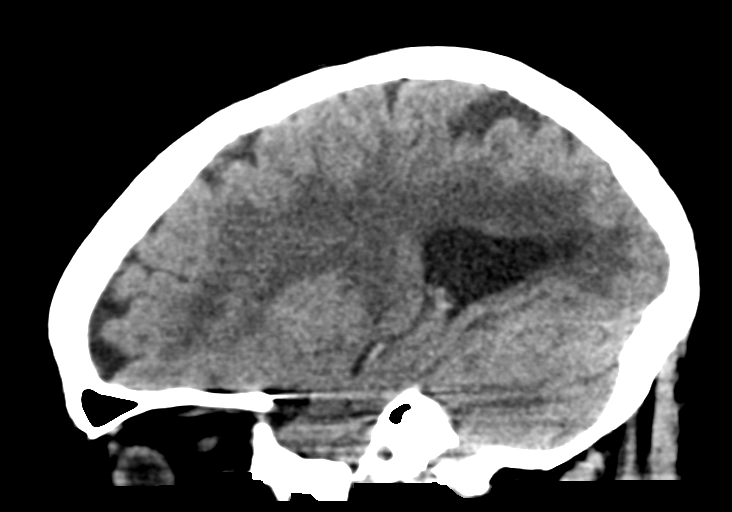

[14 of 45 positions shown; findings below may reference images not displayed]

FINDINGS: Brain: Normal anatomic configuration. Parenchymal volume loss is
commensurate with the patient's age. Extensive periventricular white
matter changes are present likely reflecting the sequela of small
vessel ischemia. These appears slightly progressive when compared to
prior examination. Bilateral thalamic infarcts are identified, new
from prior examination, but remote in nature. No abnormal intra or
extra-axial mass lesion or fluid collection. No abnormal mass effect
or midline shift. No evidence of acute intracranial hemorrhage or
infarct. Ventricular size is normal. Cerebellum unremarkable.

Vascular: No asymmetric hyperdense vasculature at the skull base.

Skull: Intact

Sinuses/Orbits: There is moderate mucosal thickening within several
ethmoid air cells bilaterally. Small layering fluid noted within the
right sphenoid sinus. Remaining paranasal sinuses are clear. Orbits
are unremarkable.

Other: Mastoid air cells and middle ear cavities are clear.
IMPRESSION: 1. No acute intracranial abnormality.
2. Extensive periventricular white matter changes likely reflecting
the sequela of small vessel ischemia. These appears slightly
progressive when compared to prior examination.
3. Bilateral thalamic infarcts, new from prior examination, but
remote in nature.
4. Paranasal sinus disease as above.

## 2019-09-29 IMAGING — CT CT ANGIO CHEST
2 of 6 series · 19 of 36 positions shown · IV contrast (APPLIED)
Comparison: None.

CLINICAL DATA: Altered mental status

EXAM:
CT ANGIOGRAPHY CHEST WITH CONTRAST
TECHNIQUE: Multidetector CT imaging of the chest was performed using the
standard protocol during bolus administration of intravenous
contrast. Multiplanar CT image reconstructions and MIPs were
obtained to evaluate the vascular anatomy.
CONTRAST:  75mL OMNIPAQUE IOHEXOL 350 MG/ML SOLN

[Series 6: thins · axial · 0.68mm/px · z∈[-476,-243]mm · 18 of 259 slices shown]
[im 13/259  lung]
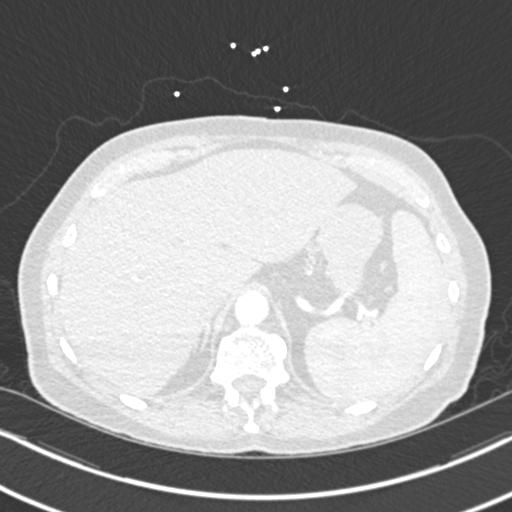
[im 26/259  mediastinal]
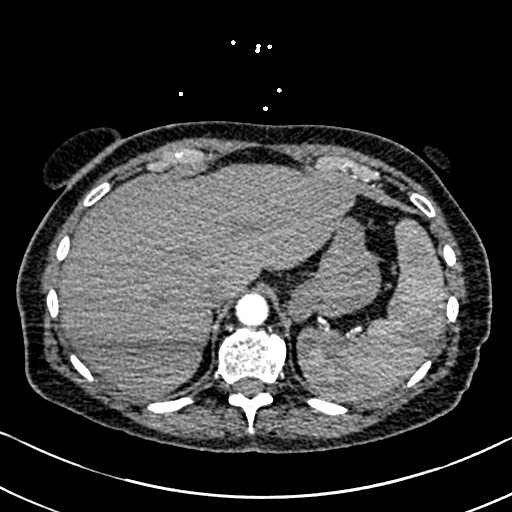
[im 39/259  lung]
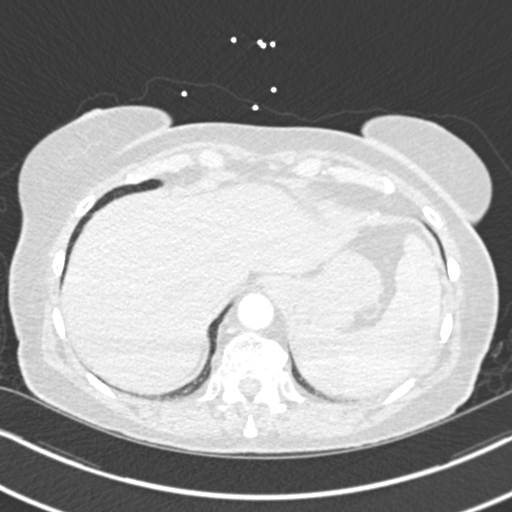
[im 52/259  mediastinal]
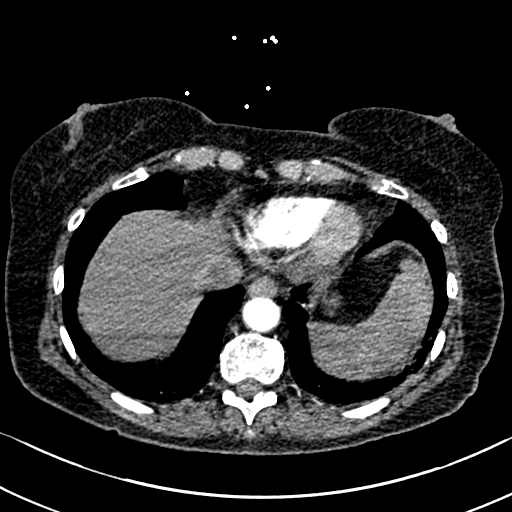
[im 65/259  lung]
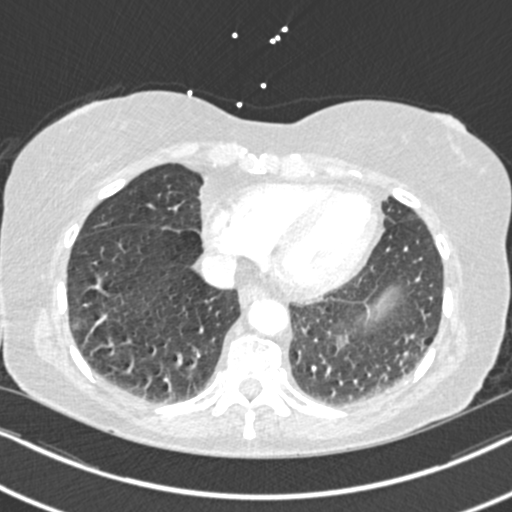
[im 78/259  mediastinal]
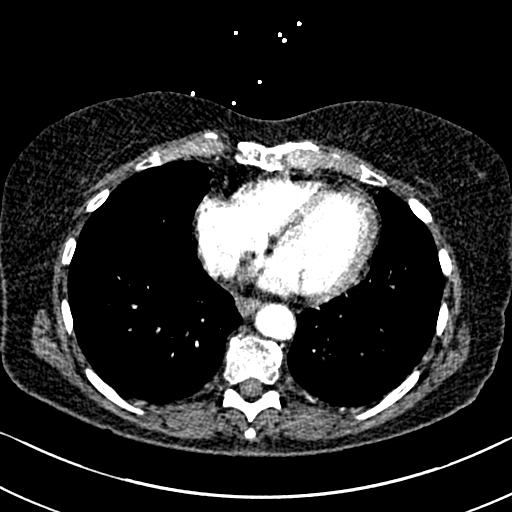
[im 91/259  lung]
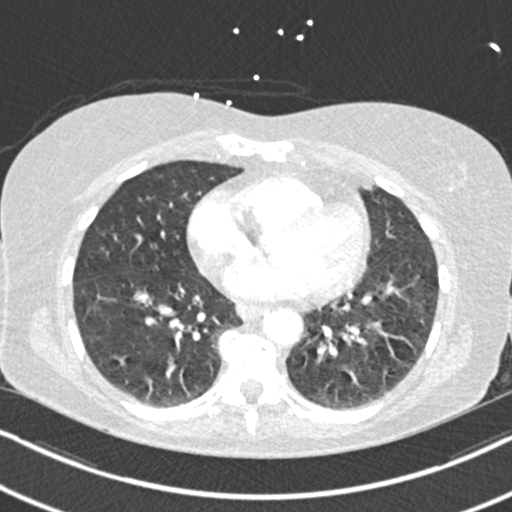
[im 104/259  mediastinal]
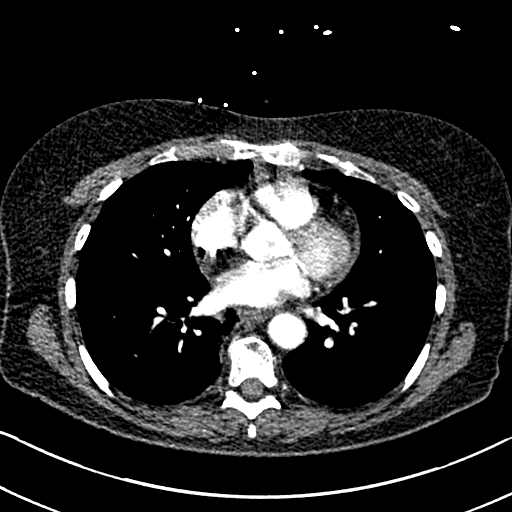
[im 117/259  lung]
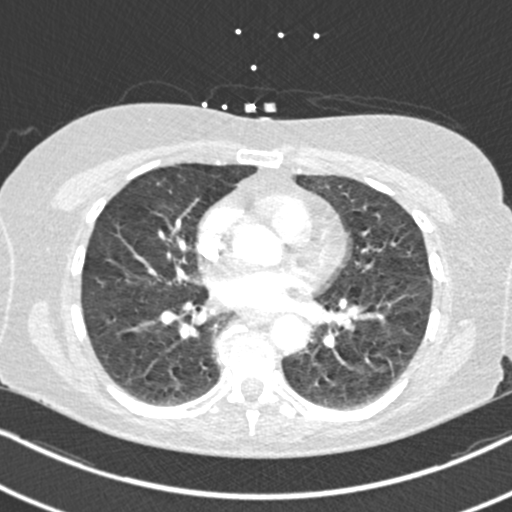
[im 142/259  mediastinal]
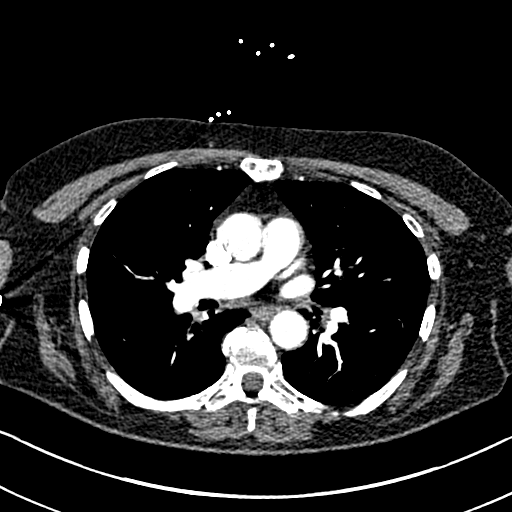
[im 155/259  lung]
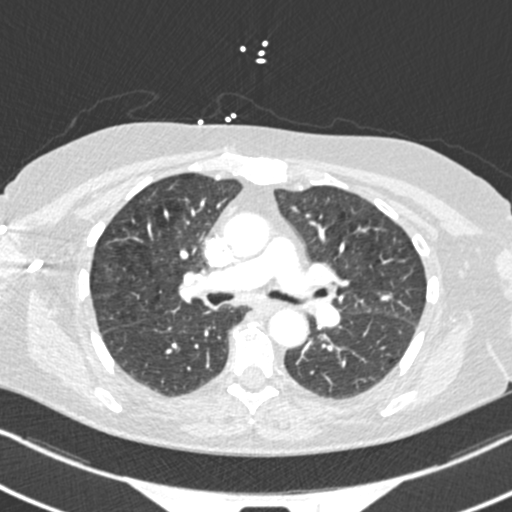
[im 168/259  mediastinal]
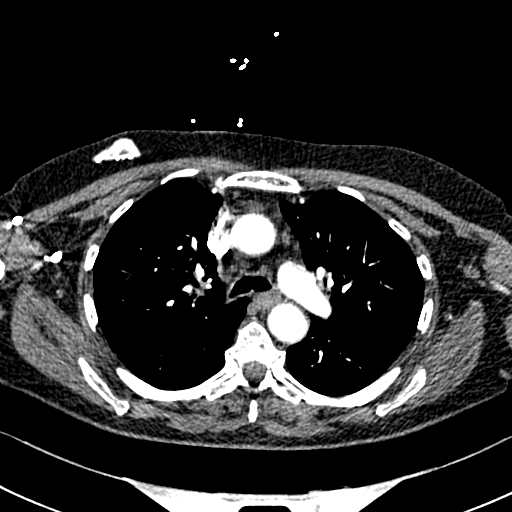
[im 181/259  lung]
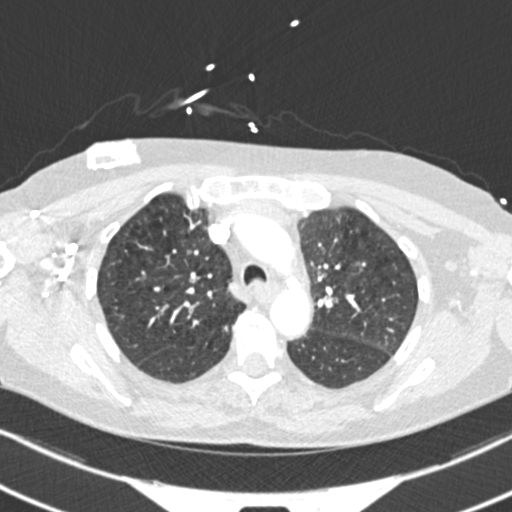
[im 194/259  mediastinal]
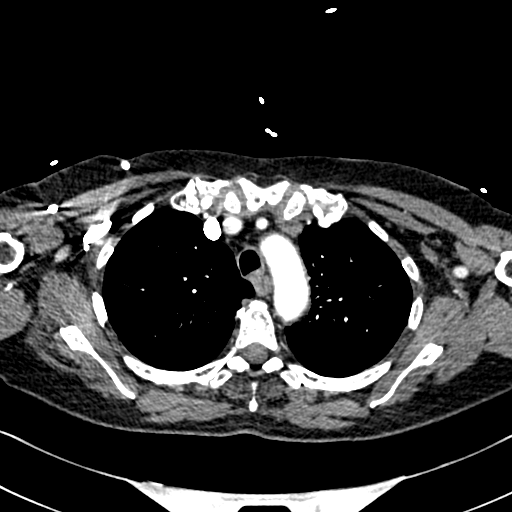
[im 207/259  lung]
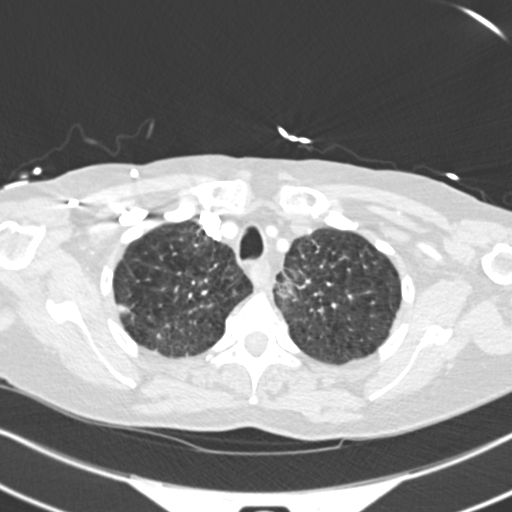
[im 220/259  mediastinal]
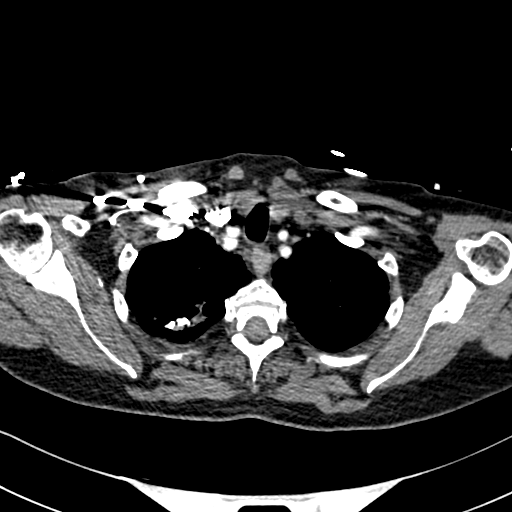
[im 233/259  lung]
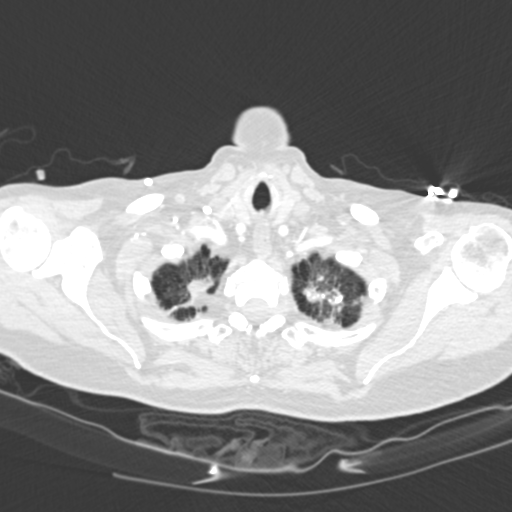
[im 246/259  mediastinal]
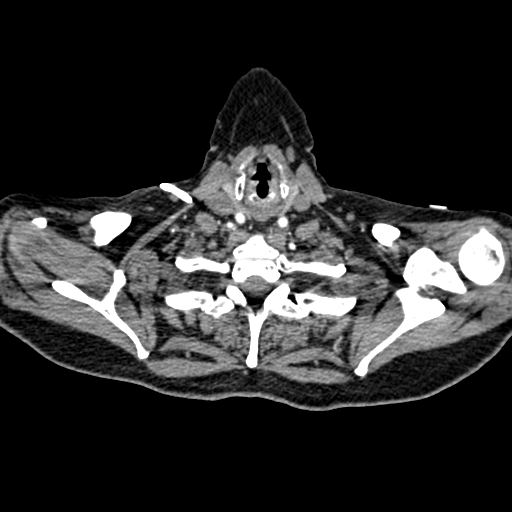

[Series 8: coronal mpr · coronal · 0.53mm/px · 1 of 79 slices shown]
[im 40/79  mediastinal]
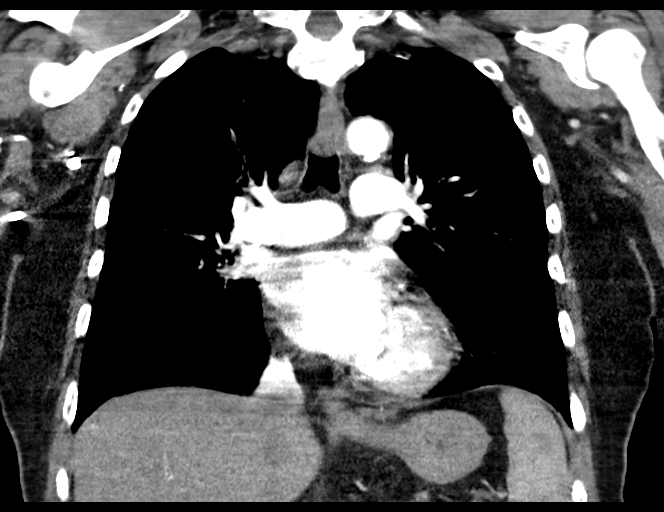

[19 of 36 positions shown; findings below may reference images not displayed]

FINDINGS: Cardiovascular: Satisfactory opacification of the pulmonary arteries
to the segmental level. No evidence of pulmonary embolism. Normal
heart size. No pericardial effusion. The thoracic aorta is of normal
caliber. Mild atherosclerotic calcification within the descending
thoracic aorta. Right internal jugular dual lumen chest port is seen
with its tip within the terminal superior vena cava.

Mediastinum/Nodes: No pathologic thoracic adenopathy. Thyroid
unremarkable. Esophagus unremarkable.

Lungs/Pleura: Moderate centrilobular emphysema. Biapical
pleuroparenchymal scarring appears stable. No superimposed focal
pulmonary nodules or infiltrates. No pneumothorax or pleural
effusion. Central airways are widely patent.

Upper Abdomen: No acute abnormality.

Musculoskeletal: No chest wall abnormality. No acute or significant
osseous findings.

Review of the MIP images confirms the above findings.
IMPRESSION: 1. No evidence of acute pulmonary embolism or other acute
intrathoracic findings.

Aortic Atherosclerosis ([EC]-[EC]) and Emphysema ([EC]-[EC]).

## 2019-09-29 MED ORDER — ENOXAPARIN SODIUM 40 MG/0.4ML ~~LOC~~ SOLN: 40.0000 mg | SUBCUTANEOUS | Status: DC

## 2019-09-29 MED ORDER — ACETAMINOPHEN 650 MG RE SUPP: 650.0000 mg | RECTAL | Status: DC | PRN

## 2019-09-29 MED ORDER — ACETAMINOPHEN 160 MG/5ML PO SOLN
650.0000 mg | ORAL | Status: DC | PRN
  Filled 2019-09-29: qty 20.3

## 2019-09-29 MED ORDER — ACETAMINOPHEN 650 MG RE SUPP
650.0000 mg | Freq: Once | RECTAL | Status: AC
  Administered 2019-09-29: 650 mg via RECTAL
  Filled 2019-09-29: qty 1

## 2019-09-29 MED ORDER — ACETAMINOPHEN 325 MG PO TABS
650.0000 mg | ORAL_TABLET | ORAL | Status: DC | PRN
  Administered 2019-10-01: 650 mg via ORAL
  Filled 2019-09-29 (×2): qty 2

## 2019-09-29 MED ORDER — STROKE: EARLY STAGES OF RECOVERY BOOK: Freq: Once | Status: DC

## 2019-09-29 MED ORDER — SODIUM CHLORIDE 0.9 % IV SOLN: INTRAVENOUS | Status: DC

## 2019-09-29 MED ORDER — KCL-LACTATED RINGERS-D5W 20 MEQ/L IV SOLN
Freq: Once | INTRAVENOUS | Status: AC
  Filled 2019-09-29: qty 1000

## 2019-09-29 MED ORDER — IOHEXOL 350 MG/ML SOLN
75.0000 mL | Freq: Once | INTRAVENOUS | Status: AC | PRN
  Administered 2019-09-29: 75 mL via INTRAVENOUS

## 2019-09-29 NOTE — ED Notes (Signed)
ED Provider at bedside. 

## 2019-09-29 NOTE — ED Notes (Signed)
Pharmacy called for ordered IVF. Pharmacy to send.

## 2019-09-29 NOTE — ED Notes (Signed)
Beth Stanley RT notified of VBG

## 2019-09-29 NOTE — ED Notes (Signed)
Pt sats on room air 92-93%, pt placed on 2 L [place]. Pt sats increased to 97-98% on 2 L [place]

## 2019-09-29 NOTE — ED Provider Notes (Addendum)
[facility] Emergency Department Provider Note  ____________________________________________  Time seen: Approximately 8:49 PM  I have reviewed the triage vital signs and the nursing notes.   HISTORY  Chief Complaint Altered Mental Status    Level 5 Caveat: Portions of the History and Physical including HPI and review of systems are unable to be completely obtained due to patient altered mental status  HPI Beth Stanley is a 69 y.o. female with a history of diabetes hypertension and recurrent non-Hodgkin's lymphoma receiving chemotherapy who was brought to the ED today due to altered mental status that started midday today around the time the patient received a Regeneron monoclonal antibody infusion for Covid.  She was diagnosed with Covid 6 days ago and has reported some shortness of breath but otherwise seem to be doing fine.  Husband notes that yesterday she seemed normal and today she had a rapid functional decline.  Denies any head trauma, does not take blood thinners.  No other reported symptoms.     Past Medical History:  Diagnosis Date  . Anxiety   . Arthritis   . Diabetes ([place])   . Heartburn   . HTN (hypertension)   . Non Hodgkin's lymphoma ([place]) 2004   Hx chemo tx's.      Patient Active Problem List   Diagnosis Date Noted  . COVID-19 virus infection 09/29/2019  . Acute metabolic encephalopathy 30/07/6224  . Hypokalemia 09/29/2019  . Smoker 04/25/2014  . Lymphoma ([place]) 02/03/2012  . Hyperlipidemia 02/03/2012  . Hypertension 02/03/2012  . Reflux 02/03/2012  . Depression 02/03/2012     Past Surgical History:  Procedure Laterality Date  . TOTAL ABDOMINAL HYSTERECTOMY       Prior to Admission medications   Medication Sig Start Date End Date Taking? Authorizing Provider  acyclovir (ZOVIRAX) 800 MG tablet Take 800 mg by mouth 2 (two) times daily.    [provider]  ALPRAZolam Duanne Moron) 0.5 MG tablet TAKE 1/2-1 TABLET BY  MOUTH AS NEEDED FOR PANIC Patient not taking: Reported on 04/14/2018 11/05/17   Rutherford Guys, MD  blood glucose meter kit and supplies KIT Test blood sugar once daily. Dx code: E11.9 06/27/15   [name], PA-C  diphenoxylate-atropine (LOMOTIL) 2.5-0.025 MG tablet Take 1 tablet by mouth 4 (four) times daily as needed for diarrhea or loose stools.    [provider]  estradiol (ESTRACE) 0.1 MG/GM vaginal cream Place 1 Applicatorful vaginally at bedtime. Patient not taking: Reported on 03/02/2017 02/16/17   [name], PA-C  folic acid (FOLVITE) 1 MG tablet Take 1 mg by mouth daily.    [provider]  FREESTYLE LITE test strip TEST BLOOD SUGAR ONCE DAILY 09/19/16   [name], PA-C  gabapentin (NEURONTIN) 300 MG capsule Take 1 tablet each night for peripheral neuropathy Patient not taking: Reported on 04/14/2018 04/14/16   [name], PA-C  Lancets (FREESTYLE) lancets TEST BLOOD GLUCOSE ONCE DAILY 07/28/16   [name], PA-C  loperamide (IMODIUM) 2 MG capsule Take 1 capsule (2 mg total) by mouth 2 (two) times daily. Patient not taking: Reported on 04/14/2018 06/10/17   [name], PA-C  loratadine (CLARITIN) 10 MG tablet Take 10 mg by mouth daily as needed for allergies.    [provider]  Multiple Vitamin (MULTIVITAMIN) tablet Take 1 tablet by mouth daily.    [provider]  omeprazole (PRILOSEC) 20 MG capsule Take 1 capsule (20 mg total) by mouth daily. 02/16/17  [name], PA-C  ondansetron (ZOFRAN) 8 MG tablet Take 0.5 tablets (4 mg total) by mouth every 8 (eight) hours as needed for nausea or vomiting. Patient not taking: Reported on 04/14/2018 06/10/17   [name], PA-C  prochlorperazine (COMPAZINE) 10 MG tablet Take 1 tablet (10 mg total) by mouth every 8 (eight) hours as needed for nausea or vomiting. 07/15/17 08/14/17  [name], PA-C  ramipril (ALTACE) 2.5 MG capsule Take 1 capsule (2.5 mg total) by mouth daily.  08/08/17   [name], PA-C  sertraline (ZOLOFT) 100 MG tablet TAKE ONE (1) TABLET EACH DAY 06/10/17   [name], PA-C  traMADol (ULTRAM) 50 MG tablet Take 0.5-1 tablets (25-50 mg total) by mouth every 12 (twelve) hours as needed for severe pain (For arthritis pain). Patient not taking: Reported on 04/14/2018 08/08/17   [name], PA-C     Allergies Other and Tape   Family History  Problem Relation Age of Onset  . Diabetes Mother   . Lung cancer Sister   . Lung cancer Brother   . Kidney cancer Brother        removed kidney   . Lung cancer Brother   . Prostate cancer Neg Hx   . Bladder Cancer Neg Hx     Social History Social History   Tobacco Use  . Smoking status: Current Every Day Smoker    Packs/day: 1.00  . Smokeless tobacco: Never Used  Substance Use Topics  . Alcohol use: Yes    Alcohol/week: 0.0 standard drinks    Comment: Wine ocass  . Drug use: No    Review of Systems Level 5 Caveat: Portions of the History and Physical including HPI and review of systems are unable to be completely obtained due to patient being a poor historian   Constitutional:   No known fever.  ENT:   No rhinorrhea. Cardiovascular:   No chest pain or syncope. Respiratory: Positive shortness of breath without cough. Gastrointestinal:   Negative for abdominal pain, vomiting and diarrhea.  Musculoskeletal:   Negative for focal pain or swelling ____________________________________________   PHYSICAL EXAM:  VITAL SIGNS: ED Triage Vitals  Enc Vitals Group     BP 09/29/19 1927 (!) 150/72     Pulse Rate 09/29/19 1927 (!) 107     Resp 09/29/19 1927 (!) 32     Temp 09/29/19 1927 99.6 F (37.6 C)     Temp Source 09/29/19 1927 Oral     SpO2 09/29/19 1927 97 %     Weight 09/29/19 2007 138 lb (62.6 kg)     Height 09/29/19 2007 '5\' 3"'  (1.6 m)     Head Circumference --      Peak Flow --      Pain Score --      Pain Loc --      Pain Edu? --      Excl. in [place]? --     Vital  signs reviewed, nursing assessments reviewed.   Constitutional:   Somnolent, arousable to voice but unable to follow commands.  Ill-appearing. Eyes:   Conjunctivae are normal. EOM untestable. PERRL. ENT      Head:   Normocephalic and atraumatic.      Nose:   No congestion/rhinnorhea.       Mouth/Throat:   Dry mucous membranes, no pharyngeal erythema. No peritonsillar mass.       Neck:   No meningismus. Full ROM. Hematological/Lymphatic/Immunilogical:   No cervical lymphadenopathy.  Cardiovascular:   Tachycardia heart rate 105. Symmetric bilateral radial and DP pulses.  No murmurs. Cap refill less than 2 seconds. Respiratory: Tachypnea.  No wheezing.  Scant bilateral basilar crackles.  Gastrointestinal:   Soft and nontender. Non distended. There is no CVA tenderness.  No rebound, rigidity, or guarding.  Musculoskeletal:   Normal range of motion in all extremities. No joint effusions.  No lower extremity tenderness.  No edema. Neurologic:   Somnolent, unable to follow commands, cranial nerves symmetric. No acute focal neurologic deficits noted Skin:    Skin is warm, dry and intact. No rash noted.  No petechiae, purpura, or bullae.  ____________________________________________    LABS (pertinent positives/negatives) (all labs ordered are listed, but only abnormal results are displayed) Labs Reviewed  BASIC METABOLIC PANEL - Abnormal; Notable for the following components:      Result Value   Potassium 2.7 (*)    Glucose, Bld 109 (*)    Calcium 8.3 (*)    All other components within normal limits  CBC - Abnormal; Notable for the following components:   RBC 3.56 (*)    Hemoglobin 10.9 (*)    HCT 33.0 (*)    RDW 15.9 (*)    Platelets 139 (*)    All other components within normal limits  BLOOD GAS, VENOUS - Abnormal; Notable for the following components:   pCO2, Ven 41 (*)    All other components within normal limits  URINALYSIS, ROUTINE W REFLEX MICROSCOPIC - Abnormal; Notable for  the following components:   Color, Urine STRAW (*)    APPearance CLEAR (*)    Specific Gravity, Urine 1.035 (*)    Hgb urine dipstick SMALL (*)    All other components within normal limits  MAGNESIUM  TROPONIN I (HIGH SENSITIVITY)  TROPONIN I (HIGH SENSITIVITY)   ____________________________________________   EKG  Interpreted by me Sinus tachycardia rate 109.  Normal axis and intervals.  Poor R wave progression.  Normal ST segments and T waves.  ____________________________________________    RADIOLOGY  DG Chest 1 View  Result Date: 09/29/2019 CLINICAL DATA:  Lethargy, altered level of consciousness, COVID-19 positive 09/23/2019, pain after infusion EXAM: CHEST  1 VIEW COMPARISON:  05/05/2017 FINDINGS: Frontal view of the chest demonstrates stable right chest wall port. Cardiac silhouette is unremarkable. There is background parenchymal lung scarring without focal consolidation, effusion, or pneumothorax. No acute bony abnormalities. IMPRESSION: 1. No acute intrathoracic process. Electronically Signed   By: Randa Ngo M.D.   On: 09/29/2019 20:12   CT HEAD WO CONTRAST  Result Date: 09/29/2019 CLINICAL DATA:  Altered mental status EXAM: CT HEAD WITHOUT CONTRAST TECHNIQUE: Contiguous axial images were obtained from the base of the skull through the vertex without intravenous contrast. COMPARISON:  04/07/2016 FINDINGS: Brain: Normal anatomic configuration. Parenchymal volume loss is commensurate with the patient's age. Extensive periventricular white matter changes are present likely reflecting the sequela of small vessel ischemia. These appears slightly progressive when compared to prior examination. Bilateral thalamic infarcts are identified, new from prior examination, but remote in nature. No abnormal intra or extra-axial mass lesion or fluid collection. No abnormal mass effect or midline shift. No evidence of acute intracranial hemorrhage or infarct. Ventricular size is normal.  Cerebellum unremarkable. Vascular: No asymmetric hyperdense vasculature at the skull base. Skull: Intact Sinuses/Orbits: There is moderate mucosal thickening within several ethmoid air cells bilaterally. Small layering fluid noted within the right sphenoid sinus. Remaining paranasal sinuses are clear. Orbits are unremarkable. Other: Mastoid air  cells and middle ear cavities are clear. IMPRESSION: 1. No acute intracranial abnormality. 2. Extensive periventricular white matter changes likely reflecting the sequela of small vessel ischemia. These appears slightly progressive when compared to prior examination. 3. Bilateral thalamic infarcts, new from prior examination, but remote in nature. 4. Paranasal sinus disease as above. Electronically Signed   By: Fidela Salisbury MD   On: 09/29/2019 21:32   CT Angio Chest PE W and/or Wo Contrast  Result Date: 09/29/2019 CLINICAL DATA:  Altered mental status EXAM: CT ANGIOGRAPHY CHEST WITH CONTRAST TECHNIQUE: Multidetector CT imaging of the chest was performed using the standard protocol during bolus administration of intravenous contrast. Multiplanar CT image reconstructions and MIPs were obtained to evaluate the vascular anatomy. CONTRAST:  81m OMNIPAQUE IOHEXOL 350 MG/ML SOLN COMPARISON:  None. FINDINGS: Cardiovascular: Satisfactory opacification of the pulmonary arteries to the segmental level. No evidence of pulmonary embolism. Normal heart size. No pericardial effusion. The thoracic aorta is of normal caliber. Mild atherosclerotic calcification within the descending thoracic aorta. Right internal jugular dual lumen chest port is seen with its tip within the terminal superior vena cava. Mediastinum/Nodes: No pathologic thoracic adenopathy. Thyroid unremarkable. Esophagus unremarkable. Lungs/Pleura: Moderate centrilobular emphysema. Biapical pleuroparenchymal scarring appears stable. No superimposed focal pulmonary nodules or infiltrates. No pneumothorax or pleural  effusion. Central airways are widely patent. Upper Abdomen: No acute abnormality. Musculoskeletal: No chest wall abnormality. No acute or significant osseous findings. Review of the MIP images confirms the above findings. IMPRESSION: 1. No evidence of acute pulmonary embolism or other acute intrathoracic findings. Aortic Atherosclerosis (ICD10-I70.0) and Emphysema (ICD10-J43.9). Electronically Signed   By: AFidela SalisburyMD   On: 09/29/2019 21:37    ____________________________________________   PROCEDURES Procedures  ____________________________________________  DIFFERENTIAL DIAGNOSIS   Intracranial hemorrhage, stroke, pulmonary embolism, metabolic encephalopathy, hypercapnia/CO2 narcosis, dehydration, electrolyte abnormality  CLINICAL IMPRESSION / ASSESSMENT AND PLAN / ED COURSE  Medications ordered in the ED: Medications  dextrose 5% in lactated ringers with KCl 20 mEq/L infusion ( Intravenous New Bag/Given 09/29/19 2141)  iohexol (OMNIPAQUE) 350 MG/ML injection 75 mL (75 mLs Intravenous Contrast Given 09/29/19 2110)  acetaminophen (TYLENOL) suppository 650 mg (650 mg Rectal Given 09/29/19 2305)    Pertinent labs & imaging results that were available during my care of the patient were reviewed by me and considered in my medical decision making (see chart for details).   GYESLY GERETYwas evaluated in Emergency Department on 09/29/2019 for the symptoms described in the history of present illness. She was evaluated in the context of the global COVID-19 pandemic, which necessitated consideration that the patient might be at risk for infection with the SARS-CoV-2 virus that causes COVID-19. Institutional protocols and algorithms that pertain to the evaluation of patients at risk for COVID-19 are in a state of rapid change based on information released by regulatory bodies including the CDC and federal and state organizations. These policies and algorithms were followed during the patient's  care in the ED.   Patient with recurrent lymphoma, receiving chemotherapy, and current COVID-19 infection presents with tachycardia, tachypnea, shortness of breath, altered mental status after receiving Regeneron infusion.  Obtain CT head, CT angiogram chest.  Give IV fluids for hydration.  Check a VBG.  Clinical Course as of Sep 29 2327  Thu Sep 29, 2019  2145 CT head and CTA chest negative for acute findings. CT head does report bilateral thalamic infarcts that are thought to be remote but new since 2018.   [PS]  2215 Pt  still somnolent, arouses to voice but unable to follow any commands. Unable to complete much of a neuro exam. IVF still ongoing. Will obtain MRI brain while continuing IVF. If MRI is positive for acute lesion or there is no improvement of mental status after IVF, will need to hospitalize for further management of encephalopathy.   [PS]    Clinical Course User Index [PS] Carrie Mew, MD      ----------------------------------------- 11:29 PM on 09/29/2019 -----------------------------------------  Still no improvement, now febrile to 102.  Discussed with hospitalist for further management of acute encephalopathy and SIRS in setting of COVID-19 infection.  No pulmonary compromise. ____________________________________________   FINAL CLINICAL IMPRESSION(S) / ED DIAGNOSES    Final diagnoses:  COVID-19 virus infection  SIRS (systemic inflammatory response syndrome) ([place])  Encephalopathy acute     ED Discharge Orders    None      Portions of this note were generated with dragon dictation software. Dictation errors may occur despite best attempts at proofreading.   Carrie Mew, MD 09/29/19 2219    Carrie Mew, MD 09/29/19 2329

## 2019-09-29 NOTE — ED Triage Notes (Signed)
Pt arrived via EMS from home where her husband called due to lethargic behavior and AMS. Pt diagnosed with COVID on 9/10 and today received the REGEN_COV infusion. Pts behavior present post infusion, per spouse. Pt not alert and oriented to situation or time.

## 2019-09-29 NOTE — ED Notes (Signed)
Date and time results received: 09/29/19 2011 (use smartphrase ".now" to insert current time)  Test: potassium Critical Value: 2.7  Name of Provider Notified: Dr. Joni Fears  Orders Received? Or Actions Taken?: Actions Taken: n/a

## 2019-09-30 ENCOUNTER — Observation Stay: Payer: PPO

## 2019-09-30 ENCOUNTER — Observation Stay (HOSPITAL_COMMUNITY)
Admit: 2019-09-30 | Discharge: 2019-09-30 | Disposition: A | Discharge status: Home / Self Care | Payer: PPO | Attending: Internal Medicine | Admitting: Internal Medicine

## 2019-09-30 DIAGNOSIS — I6523 Occlusion and stenosis of bilateral carotid arteries: Secondary | ICD-10-CM | POA: Diagnosis not present

## 2019-09-30 DIAGNOSIS — E119 Type 2 diabetes mellitus without complications: Secondary | ICD-10-CM | POA: Diagnosis present

## 2019-09-30 DIAGNOSIS — G9341 Metabolic encephalopathy: Secondary | ICD-10-CM

## 2019-09-30 DIAGNOSIS — A414 Sepsis due to anaerobes: Secondary | ICD-10-CM | POA: Diagnosis present

## 2019-09-30 DIAGNOSIS — A0472 Enterocolitis due to Clostridium difficile, not specified as recurrent: Secondary | ICD-10-CM | POA: Diagnosis present

## 2019-09-30 DIAGNOSIS — G459 Transient cerebral ischemic attack, unspecified: Secondary | ICD-10-CM

## 2019-09-30 DIAGNOSIS — Z90710 Acquired absence of both cervix and uterus: Secondary | ICD-10-CM | POA: Diagnosis not present

## 2019-09-30 DIAGNOSIS — Z8572 Personal history of non-Hodgkin lymphomas: Secondary | ICD-10-CM | POA: Diagnosis not present

## 2019-09-30 DIAGNOSIS — G319 Degenerative disease of nervous system, unspecified: Secondary | ICD-10-CM | POA: Diagnosis not present

## 2019-09-30 DIAGNOSIS — C8590 Non-Hodgkin lymphoma, unspecified, unspecified site: Secondary | ICD-10-CM | POA: Diagnosis present

## 2019-09-30 DIAGNOSIS — Z9221 Personal history of antineoplastic chemotherapy: Secondary | ICD-10-CM | POA: Diagnosis not present

## 2019-09-30 DIAGNOSIS — I10 Essential (primary) hypertension: Secondary | ICD-10-CM | POA: Diagnosis present

## 2019-09-30 DIAGNOSIS — Z79899 Other long term (current) drug therapy: Secondary | ICD-10-CM | POA: Diagnosis not present

## 2019-09-30 DIAGNOSIS — G9340 Encephalopathy, unspecified: Secondary | ICD-10-CM | POA: Diagnosis not present

## 2019-09-30 DIAGNOSIS — R9082 White matter disease, unspecified: Secondary | ICD-10-CM | POA: Diagnosis not present

## 2019-09-30 DIAGNOSIS — F17210 Nicotine dependence, cigarettes, uncomplicated: Secondary | ICD-10-CM | POA: Diagnosis present

## 2019-09-30 DIAGNOSIS — U071 COVID-19: Secondary | ICD-10-CM | POA: Diagnosis present

## 2019-09-30 DIAGNOSIS — E876 Hypokalemia: Secondary | ICD-10-CM | POA: Diagnosis present

## 2019-09-30 DIAGNOSIS — R4182 Altered mental status, unspecified: Secondary | ICD-10-CM | POA: Diagnosis present

## 2019-09-30 DIAGNOSIS — E782 Mixed hyperlipidemia: Secondary | ICD-10-CM | POA: Diagnosis not present

## 2019-09-30 LAB — C DIFFICILE QUICK SCREEN W PCR REFLEX
C Diff antigen: POSITIVE — AB
C Diff toxin: NEGATIVE

## 2019-09-30 LAB — CBC WITH DIFFERENTIAL/PLATELET
Abs Immature Granulocytes: 0.03 10*3/uL (ref 0.00–0.07)
Basophils Absolute: 0 10*3/uL (ref 0.0–0.1)
Basophils Relative: 0 %
Eosinophils Absolute: 0 10*3/uL (ref 0.0–0.5)
Eosinophils Relative: 0 %
HCT: 29.1 % — ABNORMAL LOW (ref 36.0–46.0)
Hemoglobin: 9.5 g/dL — ABNORMAL LOW (ref 12.0–15.0)
Immature Granulocytes: 1 %
Lymphocytes Relative: 57 %
Lymphs Abs: 3.1 10*3/uL (ref 0.7–4.0)
MCH: 30.4 pg (ref 26.0–34.0)
MCHC: 32.6 g/dL (ref 30.0–36.0)
MCV: 93 fL (ref 80.0–100.0)
Monocytes Absolute: 0.5 10*3/uL (ref 0.1–1.0)
Monocytes Relative: 10 %
Neutro Abs: 1.7 10*3/uL (ref 1.7–7.7)
Neutrophils Relative %: 32 %
Platelets: 120 10*3/uL — ABNORMAL LOW (ref 150–400)
RBC: 3.13 MIL/uL — ABNORMAL LOW (ref 3.87–5.11)
RDW: 16 % — ABNORMAL HIGH (ref 11.5–15.5)
WBC: 5.4 10*3/uL (ref 4.0–10.5)
nRBC: 0 % (ref 0.0–0.2)

## 2019-09-30 LAB — CLOSTRIDIUM DIFFICILE BY PCR, REFLEXED: Toxigenic C. Difficile by PCR: POSITIVE — AB

## 2019-09-30 LAB — LIPID PANEL
Cholesterol: 129 mg/dL (ref 0–200)
HDL: 33 mg/dL — ABNORMAL LOW (ref >40)
LDL Cholesterol: 67 mg/dL (ref 0–99)
Total CHOL/HDL Ratio: 3.9 RATIO
Triglycerides: 145 mg/dL (ref <150)
VLDL: 29 mg/dL (ref 0–40)

## 2019-09-30 LAB — ECHOCARDIOGRAM COMPLETE
AR max vel: 2.22 cm2
AV Area VTI: 2.01 cm2
AV Area mean vel: 2.29 cm2
AV Mean grad: 4 mmHg
AV Peak grad: 7.8 mmHg
Ao pk vel: 1.4 m/s
Area-P 1/2: 3.6 cm2
Height: 63 in
S' Lateral: 3.03 cm
Weight: 2208 oz

## 2019-09-30 LAB — HEMOGLOBIN A1C
Hgb A1c MFr Bld: 6.6 % — ABNORMAL HIGH (ref 4.8–5.6)
Mean Plasma Glucose: 142.72 mg/dL

## 2019-09-30 LAB — COMPREHENSIVE METABOLIC PANEL
ALT: 16 U/L (ref 0–44)
AST: 22 U/L (ref 15–41)
Albumin: 3 g/dL — ABNORMAL LOW (ref 3.5–5.0)
Alkaline Phosphatase: 45 U/L (ref 38–126)
Anion gap: 7 (ref 5–15)
BUN: 7 mg/dL — ABNORMAL LOW (ref 8–23)
CO2: 26 mmol/L (ref 22–32)
Calcium: 7.7 mg/dL — ABNORMAL LOW (ref 8.9–10.3)
Chloride: 107 mmol/L (ref 98–111)
Creatinine, Ser: 0.54 mg/dL (ref 0.44–1.00)
GFR calc Af Amer: >60 mL/min (ref >60)
GFR calc non Af Amer: >60 mL/min (ref >60)
Glucose, Bld: 101 mg/dL — ABNORMAL HIGH (ref 70–99)
Potassium: 2.8 mmol/L — ABNORMAL LOW (ref 3.5–5.1)
Sodium: 140 mmol/L (ref 135–145)
Total Bilirubin: 0.5 mg/dL (ref 0.3–1.2)
Total Protein: 5.3 g/dL — ABNORMAL LOW (ref 6.5–8.1)

## 2019-09-30 LAB — MAGNESIUM: Magnesium: 1.8 mg/dL (ref 1.7–2.4)

## 2019-09-30 LAB — AMMONIA: Ammonia: 24 umol/L (ref 9–35)

## 2019-09-30 LAB — HIV ANTIBODY (ROUTINE TESTING W REFLEX): HIV Screen 4th Generation wRfx: NONREACTIVE

## 2019-09-30 IMAGING — US US CAROTID DUPLEX BILAT
1 series · 13 of 24 positions shown · non-contrast
Comparison: None

CLINICAL DATA: TIA. History of hypertension, hyperlipidemia,
diabetes and smoking.

EXAM:
BILATERAL CAROTID DUPLEX ULTRASOUND
TECHNIQUE: Gray scale imaging, color Doppler and duplex ultrasound were
performed of bilateral carotid and vertebral arteries in the neck.

[Series 1: us carotid bilateral · 13 of 68 slices shown]
[im 1/68]
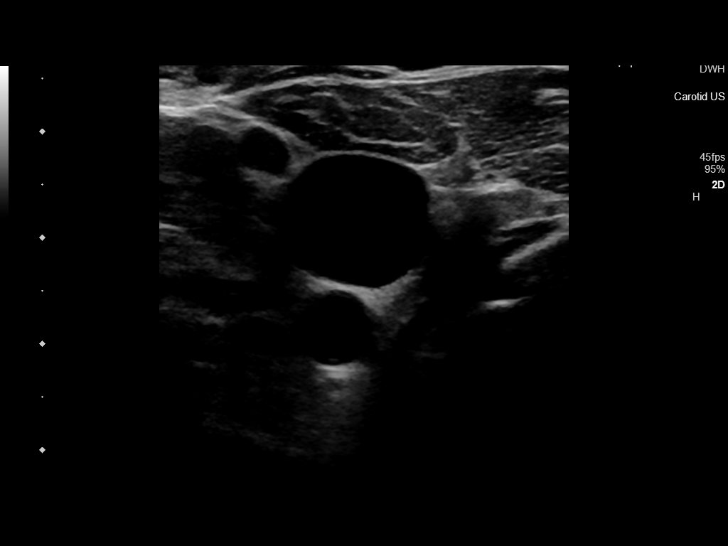
[im 6/68]
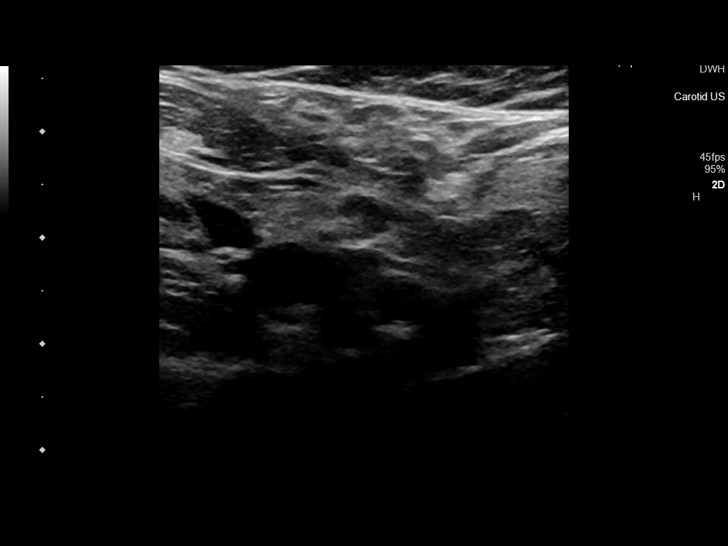
[im 12/68]
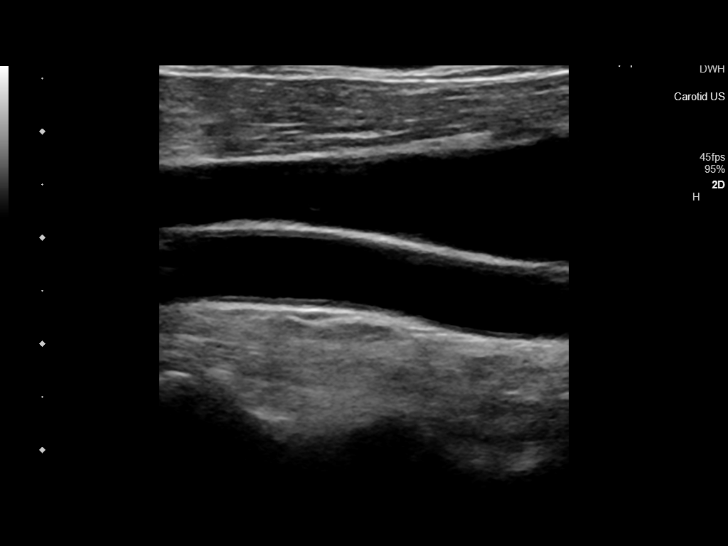
[im 18/68]
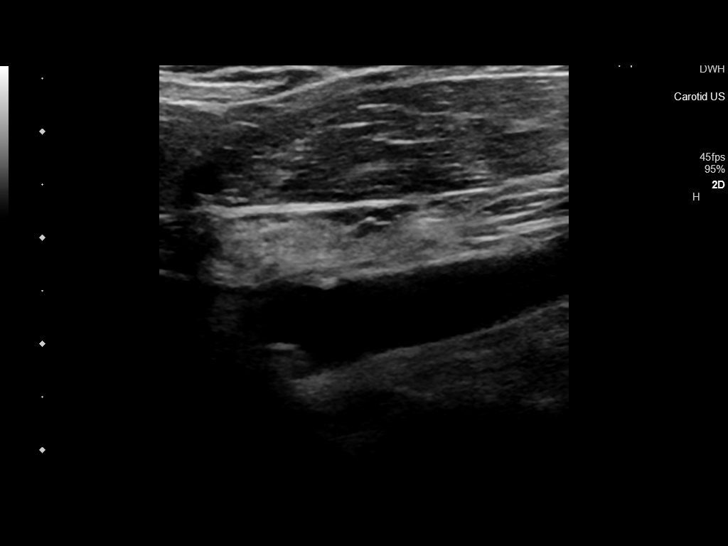
[im 24/68]
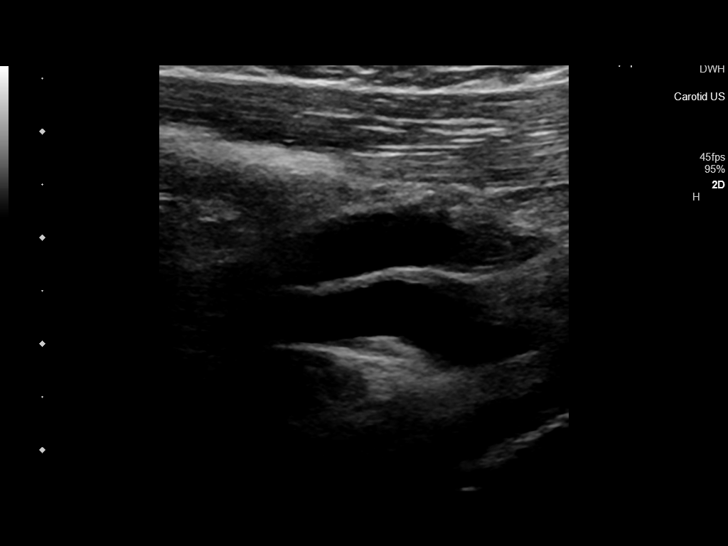
[im 30/68]
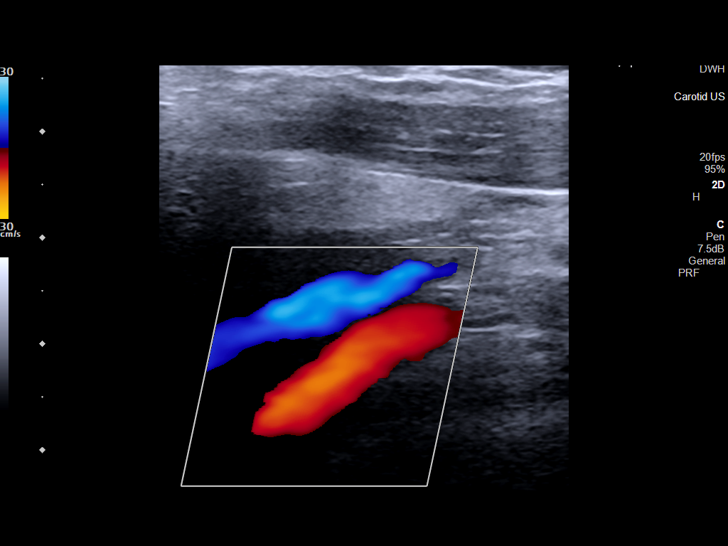
[im 35/68]
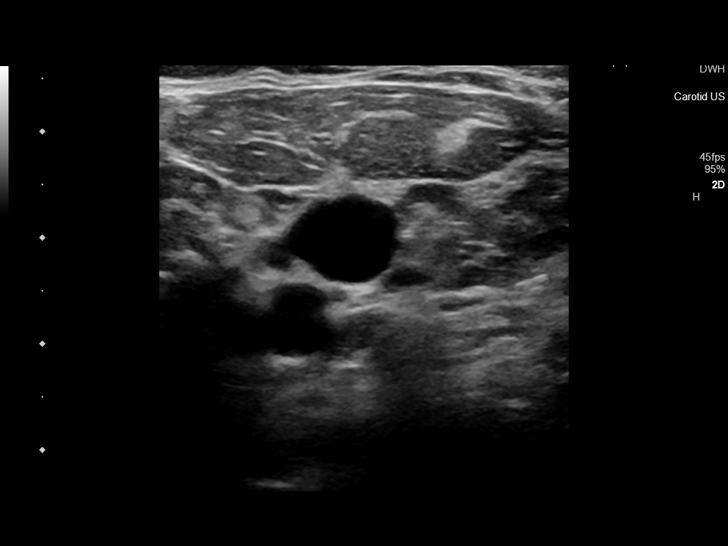
[im 38/68]
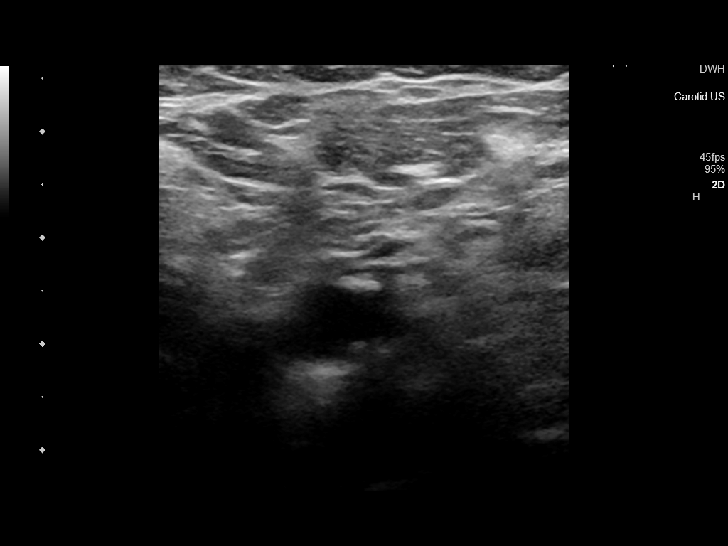
[im 44/68]
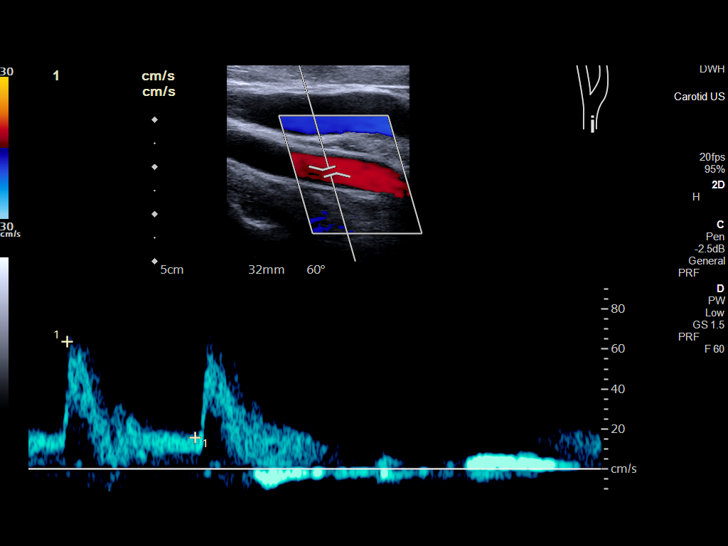
[im 50/68]
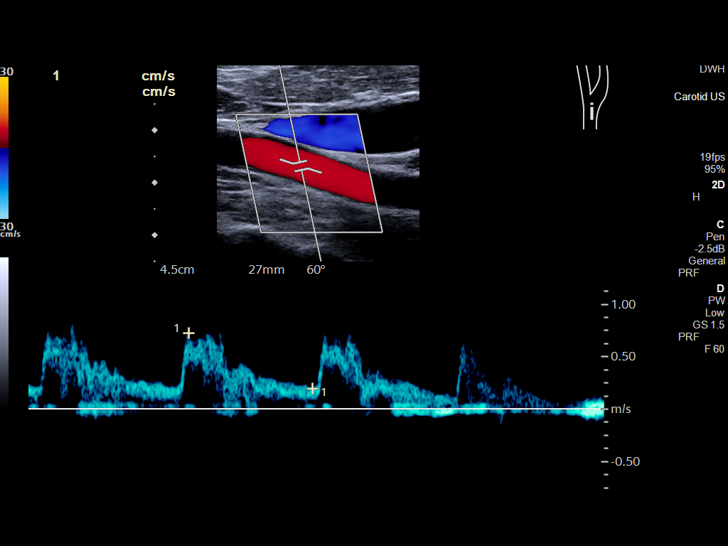
[im 56/68]
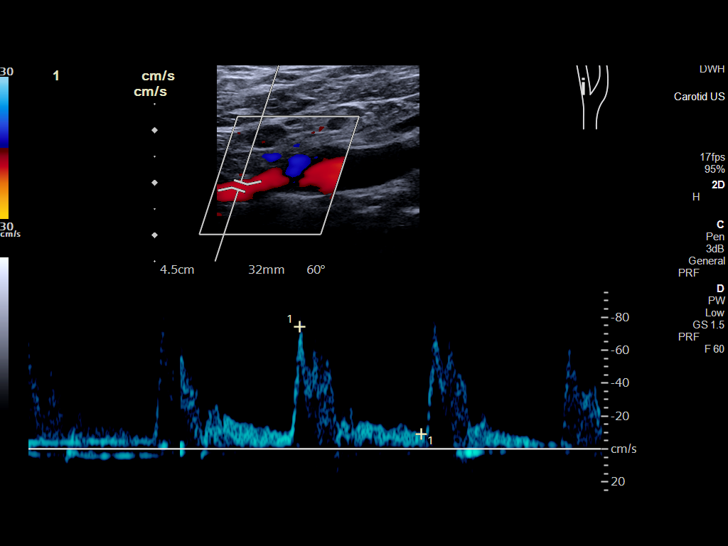
[im 62/68]
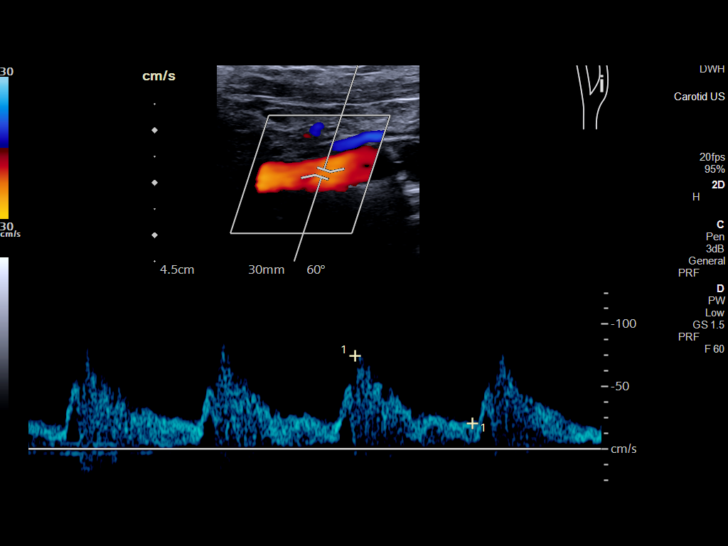
[im 68/68]
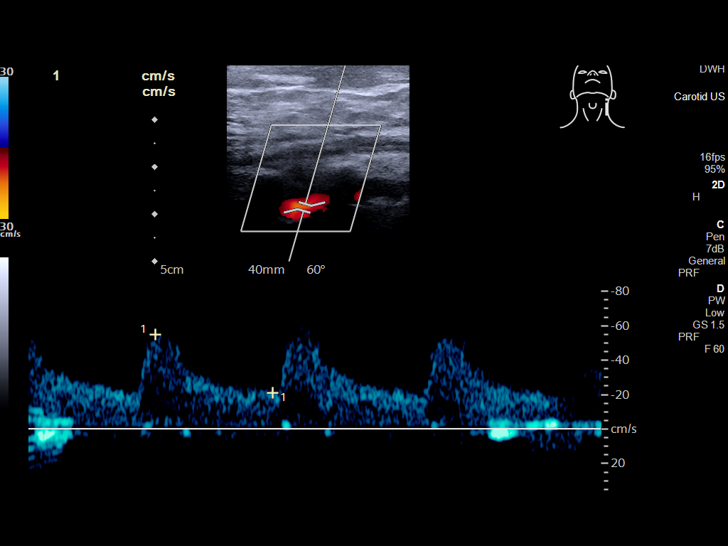

[13 of 24 positions shown; findings below may reference images not displayed]

FINDINGS: Criteria: Quantification of carotid stenosis is based on velocity
parameters that correlate the residual internal carotid diameter
with NASCET-based stenosis levels, using the diameter of the distal
internal carotid lumen as the denominator for stenosis measurement.

The following velocity measurements were obtained:

RIGHT

ICA: 96/30 cm/sec

CCA: 77/21 cm/sec

SYSTOLIC ICA/CCA RATIO:

ECA: 56 cm/sec

LEFT

ICA: 75/27 cm/sec

CCA: 73/20 cm/sec

SYSTOLIC ICA/CCA RATIO:

ECA: 74 cm/sec

RIGHT CAROTID ARTERY: There is a minimal amount of eccentric
echogenic plaque within the right carotid bulb (image 19), extending
to involve the origin and proximal aspects the right internal
carotid artery (image 25), not resulting in elevated peak systolic
velocities within the interrogated course of the right internal
carotid artery to suggest a hemodynamically significant stenosis.

RIGHT VERTEBRAL ARTERY:  Antegrade flow

LEFT CAROTID ARTERY: There is a minimal amount of eccentric
echogenic plaque within the left carotid bulb (images 38, 39 and
52), extending to involve the origin and proximal aspects of the
left internal carotid artery (image 58), not resulting in elevated
peak systolic velocities within the interrogated course of the left
internal carotid artery to suggest a hemodynamically significant
stenosis.

LEFT VERTEBRAL ARTERY:  Antegrade flow
IMPRESSION: Minimal amount of bilateral atherosclerotic plaque, not resulting in
a hemodynamically significant stenosis within either internal
carotid artery.

## 2019-09-30 IMAGING — MR MR HEAD W/O CM
11 series · 43 of 48 positions shown · non-contrast
Comparison: Brain MRI [DATE]

CLINICAL DATA: Encephalopathy. History of non-Hodgkin's lymphoma
undergoing chemotherapy.

EXAM:
MRI HEAD WITHOUT CONTRAST
TECHNIQUE: Multiplanar, multiecho pulse sequences of the brain and surrounding
structures were obtained without intravenous contrast.

[Series 5: ax dwi_tracew · axial · 3.0mm · 0.60mm/px · z∈[-86,+69]mm · 4 of 48 slices shown]
[im 1/48]
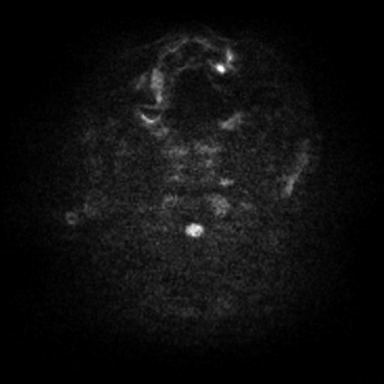
[im 16/48]
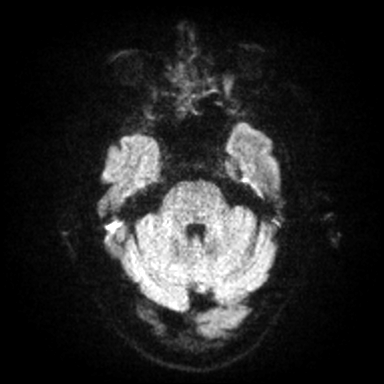
[im 32/48]
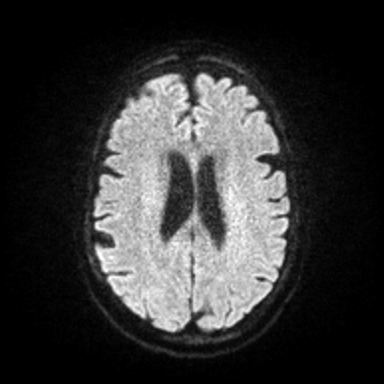
[im 48/48]
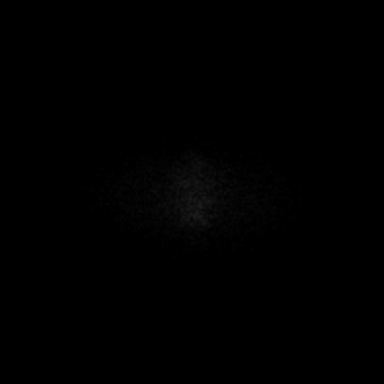

[Series 6: ax dwi_adc · axial · 3.0mm · 0.60mm/px · z∈[-86,+66]mm · 4 of 47 slices shown]
[im 1/47]
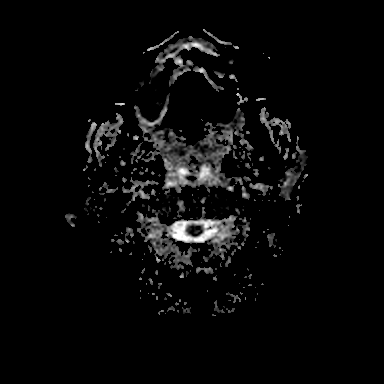
[im 16/47]
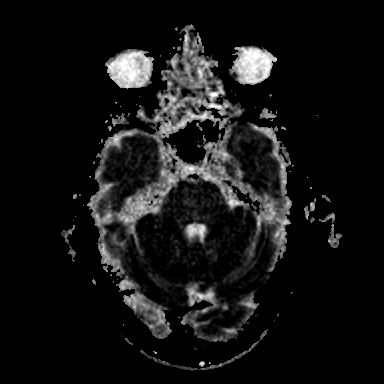
[im 31/47]
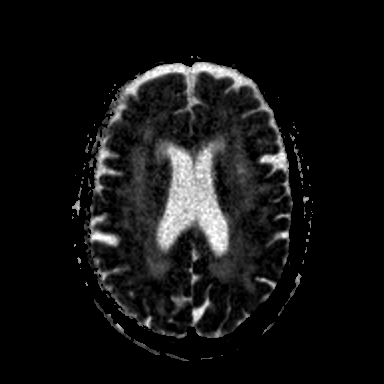
[im 47/47]
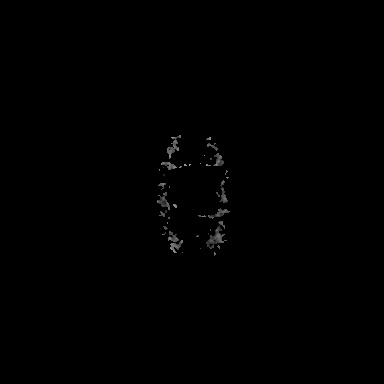

[Series 7: cor dwi_tracew · coronal · 5.0mm · 0.60mm/px · 3 of 40 slices shown]
[im 1/40]
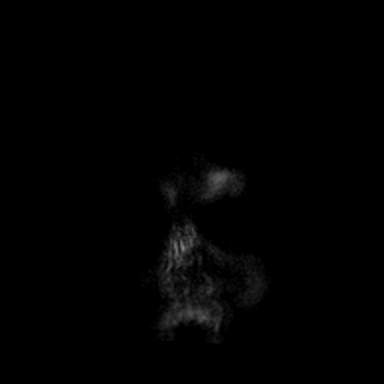
[im 20/40]
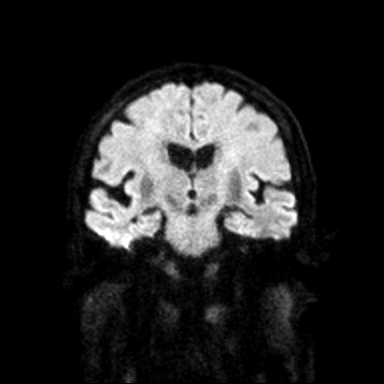
[im 40/40]
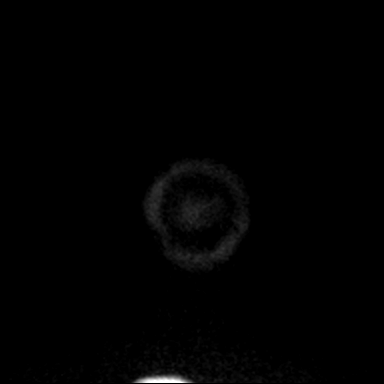

[Series 8: cor dwi_adc · coronal · 5.0mm · 0.60mm/px · 3 of 40 slices shown]
[im 1/40]
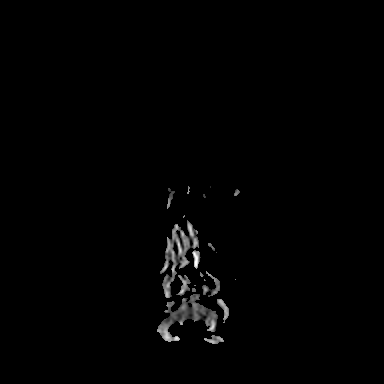
[im 20/40]
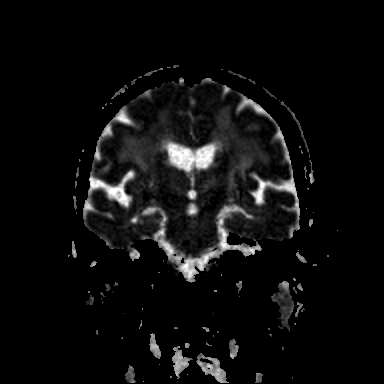
[im 40/40]
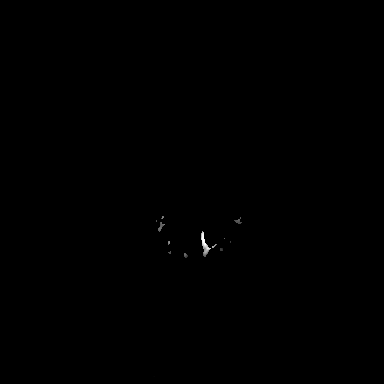

[Series 9: T1 · sagittal · 5.0mm · 0.62mm/px · 2 of 23 slices shown (1 of 2)]
[im 1/23]
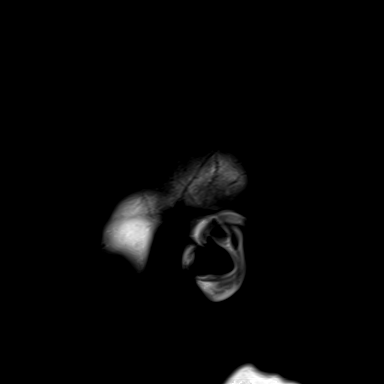
[im 23/23]
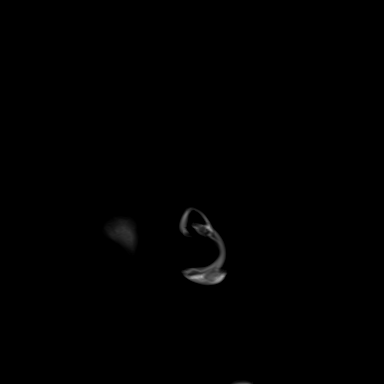

[Series 10: T2 · axial · 5.0mm · 0.53mm/px · z∈[-87,+69]mm · 2 of 27 slices shown (1 of 2)]
[im 1/27]
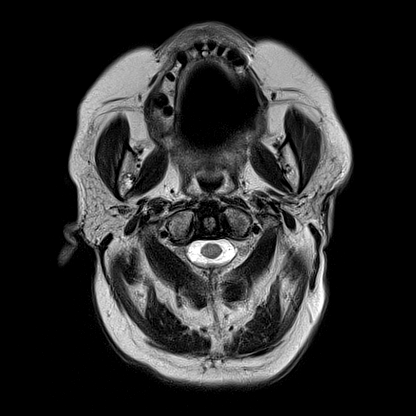
[im 27/27]
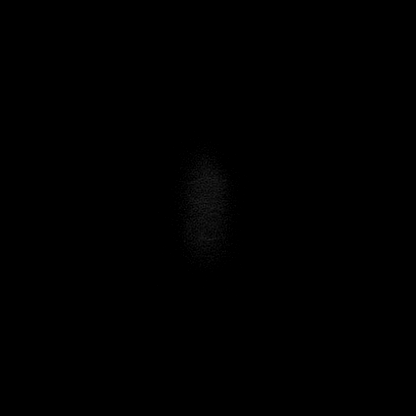

[Series 12: pha_images · axial · 3.0mm · 0.90mm/px · z∈[-91,+56]mm · 4 of 50 slices shown]
[im 1/50]
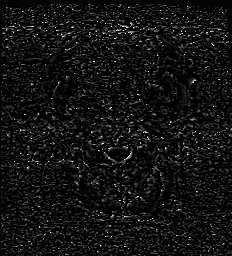
[im 17/50]
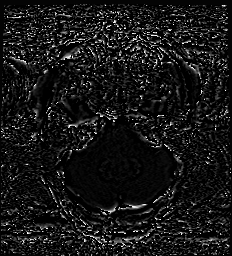
[im 33/50]
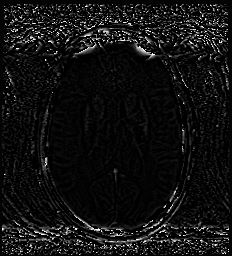
[im 50/50]
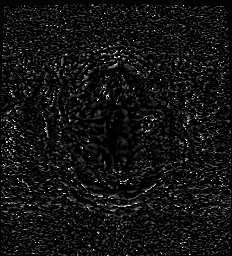

[Series 13: swi_images · axial · 3.0mm · 0.90mm/px · z∈[-97,+77]mm · 5 of 59 slices shown]
[im 1/59]
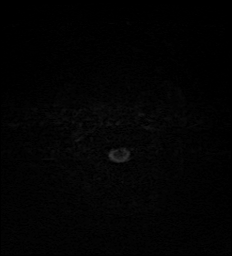
[im 15/59]
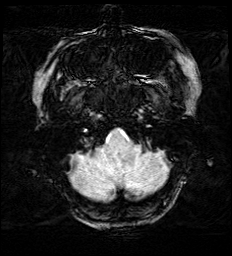
[im 30/59]
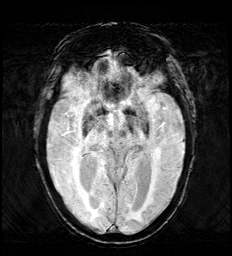
[im 44/59]
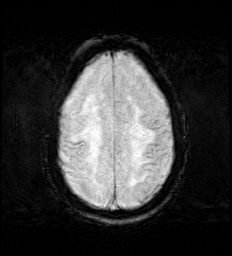
[im 59/59]
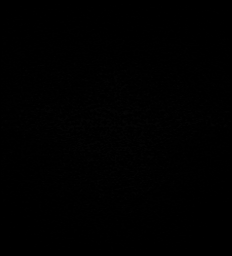

[Series 15: FLAIR · axial · 3.0mm · 0.53mm/px · z∈[-90,+72]mm · 4 of 55 slices shown]
[im 1/55]
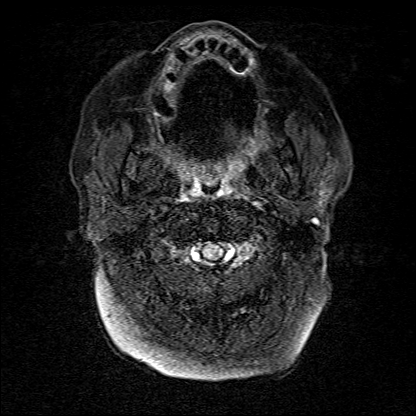
[im 19/55]
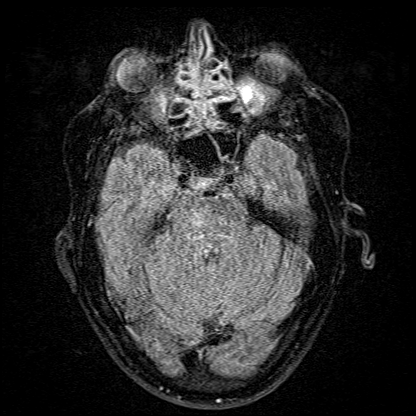
[im 37/55]
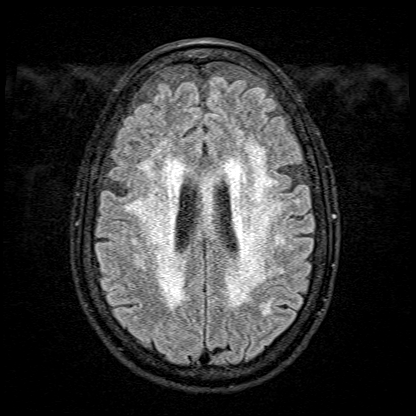
[im 55/55]
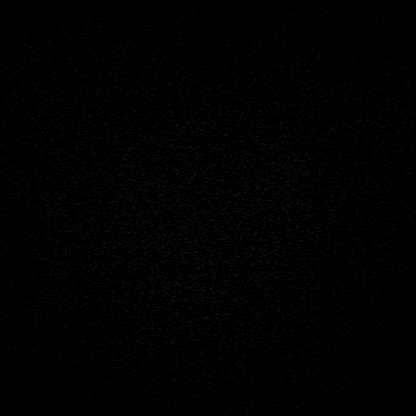

[Series 16: T1 · axial · 1.0mm · 0.98mm/px · z∈[-96,+79]mm · 9 of 172 slices shown (2 of 2)]
[im 1/172]
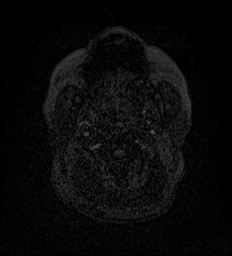
[im 14/172]
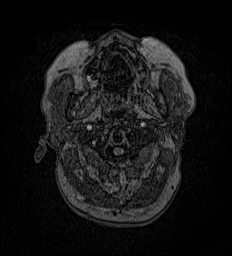
[im 27/172]
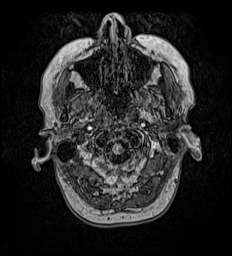
[im 53/172]
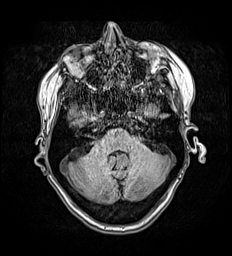
[im 79/172]
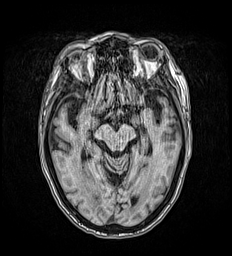
[im 93/172]
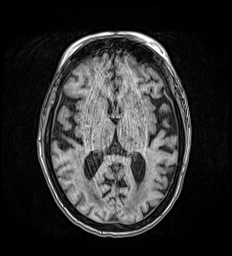
[im 119/172]
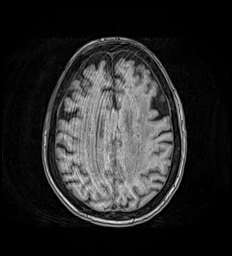
[im 145/172]
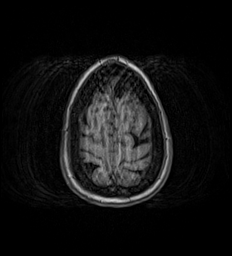
[im 172/172]
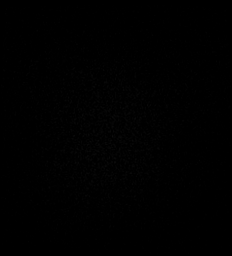

[Series 17: T2 · coronal · 5.0mm · 0.57mm/px · 3 of 31 slices shown (2 of 2)]
[im 1/31]
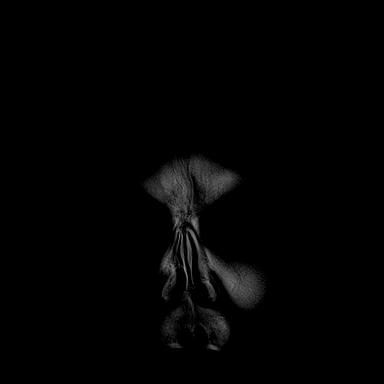
[im 16/31]
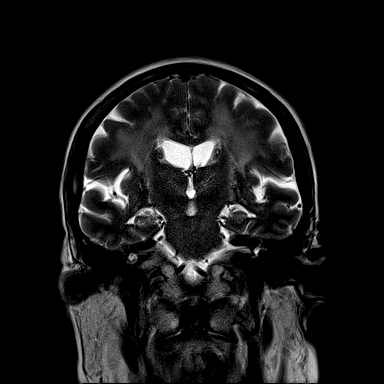
[im 31/31]
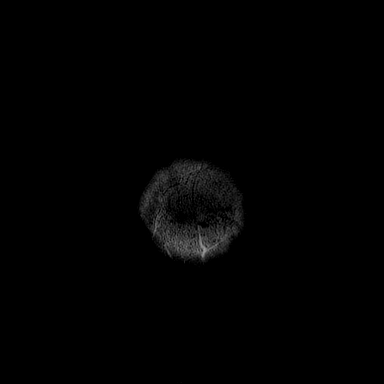

[43 of 48 positions shown; findings below may reference images not displayed]

FINDINGS: Brain: No acute infarct, acute hemorrhage or extra-axial collection.
Diffuse confluent hyperintense T2-weighted signal within the
periventricular, deep and juxtacortical white matter has markedly
worsened. There is generalized atrophy without lobar predilection.
No chronic microhemorrhage. Normal midline structures.

Vascular: Normal flow voids.

Skull and upper cervical spine: Normal marrow signal.

Sinuses/Orbits: Fluid in the maxillary and sphenoid sinuses.

Other: None
IMPRESSION: 1. No acute intracranial abnormality.
2. Severely worsened white matter disease, which could be due to
chronic microvascular ischemia or an effect of chemotherapy.
3. Fluid in the maxillary and sphenoid sinuses. Correlate for acute
sinusitis.

## 2019-09-30 MED ORDER — PREDNISOLONE ACETATE 1 % OP SUSP
1.0000 [drp] | Freq: Four times a day (QID) | OPHTHALMIC | Status: DC
  Administered 2019-09-30: 1 [drp] via OPHTHALMIC
  Filled 2019-09-30: qty 5

## 2019-09-30 MED ORDER — ALBUTEROL SULFATE HFA 108 (90 BASE) MCG/ACT IN AERS
1.0000 | INHALATION_SPRAY | Freq: Four times a day (QID) | RESPIRATORY_TRACT | Status: DC | PRN
  Filled 2019-09-30 (×2): qty 6.7

## 2019-09-30 MED ORDER — SULFAMETHOXAZOLE-TRIMETHOPRIM 800-160 MG PO TABS
1.0000 | ORAL_TABLET | ORAL | Status: DC
  Administered 2019-09-30: 1 via ORAL
  Filled 2019-09-30: qty 1

## 2019-09-30 MED ORDER — SERTRALINE HCL 50 MG PO TABS
150.0000 mg | ORAL_TABLET | Freq: Every day | ORAL | Status: DC
  Administered 2019-09-30 – 2019-10-02 (×3): 150 mg via ORAL
  Filled 2019-09-30 (×3): qty 3

## 2019-09-30 MED ORDER — PREDNISONE 10 MG PO TABS
5.0000 mg | ORAL_TABLET | Freq: Every day | ORAL | Status: DC
  Administered 2019-10-01 – 2019-10-02 (×3): 5 mg via ORAL
  Filled 2019-09-30 (×3): qty 1

## 2019-09-30 MED ORDER — PREDNISOLONE ACETATE 1 % OP SUSP
1.0000 [drp] | Freq: Every morning | OPHTHALMIC | Status: DC
  Administered 2019-10-01 – 2019-10-02 (×2): 1 [drp] via OPHTHALMIC
  Filled 2019-09-30: qty 1

## 2019-09-30 MED ORDER — KETOROLAC TROMETHAMINE 0.5 % OP SOLN
1.0000 [drp] | Freq: Four times a day (QID) | OPHTHALMIC | Status: DC
  Administered 2019-09-30 – 2019-10-02 (×8): 1 [drp] via OPHTHALMIC
  Filled 2019-09-30 (×2): qty 3

## 2019-09-30 MED ORDER — PANTOPRAZOLE SODIUM 40 MG PO TBEC
40.0000 mg | DELAYED_RELEASE_TABLET | Freq: Every day | ORAL | Status: DC
  Administered 2019-09-30 – 2019-10-02 (×3): 40 mg via ORAL
  Filled 2019-09-30 (×3): qty 1

## 2019-09-30 MED ORDER — OFLOXACIN 0.3 % OP SOLN
1.0000 [drp] | Freq: Four times a day (QID) | OPHTHALMIC | Status: DC
  Administered 2019-09-30: 1 [drp] via OPHTHALMIC
  Filled 2019-09-30: qty 5

## 2019-09-30 MED ORDER — FERROUS SULFATE 325 (65 FE) MG PO TABS
325.0000 mg | ORAL_TABLET | ORAL | Status: DC
  Administered 2019-09-30: 325 mg via ORAL
  Filled 2019-09-30: qty 1

## 2019-09-30 MED ORDER — POTASSIUM CHLORIDE 10 MEQ/100ML IV SOLN
10.0000 meq | INTRAVENOUS | Status: AC
  Administered 2019-09-30 (×3): 10 meq via INTRAVENOUS
  Filled 2019-09-30 (×2): qty 100

## 2019-09-30 MED ORDER — POTASSIUM CHLORIDE CRYS ER 20 MEQ PO TBCR
40.0000 meq | EXTENDED_RELEASE_TABLET | ORAL | Status: AC
  Administered 2019-09-30 (×3): 40 meq via ORAL
  Filled 2019-09-30 (×3): qty 2

## 2019-09-30 NOTE — ED Notes (Signed)
Pt soild with urine and had a very large liquid bowel movement. Pt was changed , placed on the bedpan for additional urination, given a new gown with linens, a pillow and a beverage to her spouse. Nothing else needed from staff at this time. A clean brief was placed onto pt.

## 2019-09-30 NOTE — ED Notes (Signed)
Visitor remains at bedside. Bed locked low. Rail up. Call bell within reach.

## 2019-09-30 NOTE — ED Notes (Signed)
Pt too lethargic at this time and unable to stay awake, unable to preform swallow screen or NIHSS screen

## 2019-09-30 NOTE — Progress Notes (Signed)
SLP Cancellation Note  Patient Details Name: Beth Stanley MRN: [id number] DOB: Oct 05, 1950   Cancelled treatment:       Reason Eval/Treat Not Completed: SLP screened, no needs identified, will sign off   Chart reviewed, report received from PT and pt's nurse. She is currently back to baseline and also passed current nurse's swallow screen. At this time, nurse will upgrade diet and ST will discharge orders as no acute deficits are identified.   [name] B. Rutherford Nail M.S., CCC-SLP, [place] [facility] [phone]    Stormy Fabian 09/30/2019, 9:56 AM

## 2019-09-30 NOTE — ED Notes (Signed)
Pt wheezing some. Mimi RN collecting pt's inhaler to bring to bedside.

## 2019-09-30 NOTE — H&P (Signed)
History and Physical    Beth Stanley YCX:[id number] DOB: [birth year] DOA: 09/29/2019  PCP: Shannan Harper, MD   Patient coming from: Home  I have personally briefly reviewed patient's old medical records in [place]  Chief Complaint: Altered mental status  HPI: Beth Stanley is a 69 y.o. female with medical history significant for recurrent follicular lymphoma receiving chemotherapy, last 09/22/18,complicated by colitis,  HTN, , recently diagnosed with Covid on 09/27/2019 for which she received Regeneron l monoclonal antibody infusion on the day of arrival who was brought to the emergency room because of altered mental status.  Patient had been doing well from a Covid standpoint with minimal shortness of breath and no cough however on the day of arrival she appeared to be lethargic and became progressively more so as the day progressed.  According to the husband at the bedside who gives most of the history, patient has had similar symptoms before related to dehydration related to chemotherapy side effects.  To date of weakness appeared worse to where she needed the assistance of others to be helped onto the stretcher.  He said earlier in the day they stopped at a restaurant and she was awake and alert but then became lethargic thereafter and worsened after her monoclonal antibody infusion.  She has had no focal weakness, numbness or tingling, visual disturbances and no falls.  History is limited due to altered mental status. ED Course: On arrival, she was tachycardic at 107, tachypneic at 32 with low-grade temperature of 99.6, BP 150/72.  Blood work notable for potassium of 2.7 but otherwise mostly unremarkable.  Covid biomarkers not done.  Chest x-ray was clear, head CT showed no acute intracranial abnormality but showed bilateral thalamic infarcts remote in nature.  She did have a CTA chest that showed no PE or other acute abnormalities.  Given finding of old thalamic infarcts, MRI was  ordered from the ER to evaluate for stroke, currently pending at time of discharge.  Hospitalist consulted for admission.  Review of Systems: Unable to obtain due to lethargy.  Patient will awake with gentle shaking but readily falls back asleep   Past Medical History:  Diagnosis Date  . Anxiety   . Arthritis   . Diabetes ([place])   . Heartburn   . HTN (hypertension)   . Non Hodgkin's lymphoma ([place]) 2004   Hx chemo tx's.     Past Surgical History:  Procedure Laterality Date  . TOTAL ABDOMINAL HYSTERECTOMY       reports that she has been smoking. She has been smoking about 1.00 pack per day. She has never used smokeless tobacco. She reports current alcohol use. She reports that she does not use drugs.  Allergies  Allergen Reactions  . Other   . Tape Rash    Makes Whelps on Skin Makes Whelps on Skin    Family History  Problem Relation Age of Onset  . Diabetes Mother   . Lung cancer Sister   . Lung cancer Brother   . Kidney cancer Brother        removed kidney   . Lung cancer Brother   . Prostate cancer Neg Hx   . Bladder Cancer Neg Hx       Prior to Admission medications   Medication Sig Start Date End Date Taking? Authorizing Provider  albuterol (VENTOLIN HFA) 108 (90 Base) MCG/ACT inhaler Inhale 1 puff into the lungs every 6 (six) hours as needed for wheezing. 09/02/19  Yes [provider]  ondansetron (ZOFRAN) 4 MG tablet Take 4 mg by mouth every 8 (eight) hours as needed. 09/27/19  Yes [provider]  sertraline (ZOLOFT) 50 MG tablet Take 150 mg by mouth daily. 09/05/19  Yes [provider]  ZYDELIG 100 MG tablet Take 100 mg by mouth in the morning and at bedtime. 09/16/19  Yes [provider]  acyclovir (ZOVIRAX) 800 MG tablet Take 800 mg by mouth 2 (two) times daily.    [provider]  ALPRAZolam Duanne Moron) 0.5 MG tablet TAKE 1/2-1 TABLET BY MOUTH AS NEEDED FOR PANIC Patient not taking: Reported on 04/14/2018 11/05/17    Rutherford Guys, MD  blood glucose meter kit and supplies KIT Test blood sugar once daily. Dx code: E11.9 06/27/15   [name], PA-C  diphenoxylate-atropine (LOMOTIL) 2.5-0.025 MG tablet Take 1 tablet by mouth 4 (four) times daily as needed for diarrhea or loose stools.    [provider]  estradiol (ESTRACE) 0.1 MG/GM vaginal cream Place 1 Applicatorful vaginally at bedtime. Patient not taking: Reported on 03/02/2017 02/16/17   [name], PA-C  folic acid (FOLVITE) 1 MG tablet Take 1 mg by mouth daily.    [provider]  FREESTYLE LITE test strip TEST BLOOD SUGAR ONCE DAILY 09/19/16   [name], PA-C  gabapentin (NEURONTIN) 300 MG capsule Take 1 tablet each night for peripheral neuropathy Patient not taking: Reported on 04/14/2018 04/14/16   [name], PA-C  Lancets (FREESTYLE) lancets TEST BLOOD GLUCOSE ONCE DAILY 07/28/16   [name], PA-C  loperamide (IMODIUM) 2 MG capsule Take 1 capsule (2 mg total) by mouth 2 (two) times daily. Patient not taking: Reported on 04/14/2018 06/10/17   [name], PA-C  loratadine (CLARITIN) 10 MG tablet Take 10 mg by mouth daily as needed for allergies.    [provider]  Multiple Vitamin (MULTIVITAMIN) tablet Take 1 tablet by mouth daily.    [provider]  omeprazole (PRILOSEC) 20 MG capsule Take 1 capsule (20 mg total) by mouth daily. 02/16/17   [name], PA-C  ondansetron (ZOFRAN) 8 MG tablet Take 0.5 tablets (4 mg total) by mouth every 8 (eight) hours as needed for nausea or vomiting. Patient not taking: Reported on 04/14/2018 06/10/17   [name], PA-C  prochlorperazine (COMPAZINE) 10 MG tablet Take 1 tablet (10 mg total) by mouth every 8 (eight) hours as needed for nausea or vomiting. 07/15/17 08/14/17  [name], PA-C  ramipril (ALTACE) 2.5 MG capsule Take 1 capsule (2.5 mg total) by mouth daily. 08/08/17   [name], PA-C  sertraline (ZOLOFT) 100 MG tablet TAKE ONE (1)  TABLET EACH DAY 06/10/17   [name], PA-C  traMADol (ULTRAM) 50 MG tablet Take 0.5-1 tablets (25-50 mg total) by mouth every 12 (twelve) hours as needed for severe pain (For arthritis pain). Patient not taking: Reported on 04/14/2018 08/08/17   [name], PA-C    Physical Exam: Vitals:   09/29/19 2202 09/29/19 2208 09/29/19 2258 09/29/19 2300  BP:  (!) 136/52  135/63  Pulse: (!) 106 (!) 108 (!) 114 (!) 109  Resp: 20 20 (!) 21 (!) 22  Temp:   (!) 102.9 F (39.4 C)   TempSrc:   Oral   SpO2: 93% 98% 100% 100%  Weight:      Height:         Vitals:   09/29/19 2202 09/29/19 2208 09/29/19 2258  09/29/19 2300  BP:  (!) 136/52  135/63  Pulse: (!) 106 (!) 108 (!) 114 (!) 109  Resp: 20 20 (!) 21 (!) 22  Temp:   (!) 102.9 F (39.4 C)   TempSrc:   Oral   SpO2: 93% 98% 100% 100%  Weight:      Height:          Constitutional:  Lethargic and will arouse to gentle shaking but readily falls back asleep HEENT:      Head: Normocephalic and atraumatic.         Eyes: PERLA, EOMI, Conjunctivae are normal. Sclera is non-icteric.       Mouth/Throat: Mucous membranes are moist.       Neck: Supple with no signs of meningismus. Cardiovascular: Regular rate and rhythm. No murmurs, gallops, or rubs. 2+ symmetrical distal pulses are present . No JVD. No LE edema Respiratory: Respiratory effort normal .Lungs sounds clear bilaterally. No wheezes, crackles, or rhonchi.  Gastrointestinal: Soft, non tender, and non distended with positive bowel sounds. No rebound or guarding. Genitourinary: No CVA tenderness. Musculoskeletal: Nontender with normal range of motion in all extremities. No cyanosis, or erythema of extremities. Neurologic: Very somnolent.  Face is symmetric. Moving all extremities. No gross focal neurologic deficits . Skin: Skin is warm, dry.  No rash or ulcers   Labs on Admission: I have personally reviewed following labs and imaging studies  CBC: Recent Labs  Lab  09/29/19 1928  WBC 6.9  HGB 10.9*  HCT 33.0*  MCV 92.7  PLT 258*   Basic Metabolic Panel: Recent Labs  Lab 09/29/19 1928  NA 143  K 2.7*  CL 109  CO2 24  GLUCOSE 109*  BUN 8  CREATININE 0.65  CALCIUM 8.3*  MG 1.8   GFR: Estimated Creatinine Clearance: 54.9 mL/min (by C-G formula based on SCr of 0.65 mg/dL). Liver Function Tests: No results for input(s): AST, ALT, ALKPHOS, BILITOT, PROT, ALBUMIN in the last 168 hours. No results for input(s): LIPASE, AMYLASE in the last 168 hours. No results for input(s): AMMONIA in the last 168 hours. Coagulation Profile: No results for input(s): INR, PROTIME in the last 168 hours. Cardiac Enzymes: No results for input(s): CKTOTAL, CKMB, CKMBINDEX, TROPONINI in the last 168 hours. BNP (last 3 results) No results for input(s): PROBNP in the last 8760 hours. HbA1C: No results for input(s): HGBA1C in the last 72 hours. CBG: No results for input(s): GLUCAP in the last 168 hours. Lipid Profile: No results for input(s): CHOL, HDL, LDLCALC, TRIG, CHOLHDL, LDLDIRECT in the last 72 hours. Thyroid Function Tests: No results for input(s): TSH, T4TOTAL, FREET4, T3FREE, THYROIDAB in the last 72 hours. Anemia Panel: No results for input(s): VITAMINB12, FOLATE, FERRITIN, TIBC, IRON, RETICCTPCT in the last 72 hours. Urine analysis:    Component Value Date/Time   COLORURINE STRAW (A) 09/29/2019 2257   APPEARANCEUR CLEAR (A) 09/29/2019 2257   APPEARANCEUR Clear 06/05/2016 1003   LABSPEC 1.035 (H) 09/29/2019 2257   PHURINE 5.0 09/29/2019 2257   GLUCOSEU NEGATIVE 09/29/2019 2257   HGBUR SMALL (A) 09/29/2019 2257   BILIRUBINUR NEGATIVE 09/29/2019 2257   BILIRUBINUR negative 02/16/2017 1628   BILIRUBINUR Negative 06/05/2016 1003   KETONESUR NEGATIVE 09/29/2019 2257   PROTEINUR NEGATIVE 09/29/2019 2257   UROBILINOGEN 0.2 02/16/2017 1628   UROBILINOGEN 0.2 01/24/2010 1052   NITRITE NEGATIVE 09/29/2019 2257   LEUKOCYTESUR NEGATIVE 09/29/2019 2257     Radiological Exams on Admission: DG Chest 1 View  Result Date: 09/29/2019 CLINICAL  DATA:  Lethargy, altered level of consciousness, COVID-19 positive 09/23/2019, pain after infusion EXAM: CHEST  1 VIEW COMPARISON:  05/05/2017 FINDINGS: Frontal view of the chest demonstrates stable right chest wall port. Cardiac silhouette is unremarkable. There is background parenchymal lung scarring without focal consolidation, effusion, or pneumothorax. No acute bony abnormalities. IMPRESSION: 1. No acute intrathoracic process. Electronically Signed   By: Randa Ngo M.D.   On: 09/29/2019 20:12   CT HEAD WO CONTRAST  Result Date: 09/29/2019 CLINICAL DATA:  Altered mental status EXAM: CT HEAD WITHOUT CONTRAST TECHNIQUE: Contiguous axial images were obtained from the base of the skull through the vertex without intravenous contrast. COMPARISON:  04/07/2016 FINDINGS: Brain: Normal anatomic configuration. Parenchymal volume loss is commensurate with the patient's age. Extensive periventricular white matter changes are present likely reflecting the sequela of small vessel ischemia. These appears slightly progressive when compared to prior examination. Bilateral thalamic infarcts are identified, new from prior examination, but remote in nature. No abnormal intra or extra-axial mass lesion or fluid collection. No abnormal mass effect or midline shift. No evidence of acute intracranial hemorrhage or infarct. Ventricular size is normal. Cerebellum unremarkable. Vascular: No asymmetric hyperdense vasculature at the skull base. Skull: Intact Sinuses/Orbits: There is moderate mucosal thickening within several ethmoid air cells bilaterally. Small layering fluid noted within the right sphenoid sinus. Remaining paranasal sinuses are clear. Orbits are unremarkable. Other: Mastoid air cells and middle ear cavities are clear. IMPRESSION: 1. No acute intracranial abnormality. 2. Extensive periventricular white matter changes likely  reflecting the sequela of small vessel ischemia. These appears slightly progressive when compared to prior examination. 3. Bilateral thalamic infarcts, new from prior examination, but remote in nature. 4. Paranasal sinus disease as above. Electronically Signed   By: Fidela Salisbury MD   On: 09/29/2019 21:32   CT Angio Chest PE W and/or Wo Contrast  Result Date: 09/29/2019 CLINICAL DATA:  Altered mental status EXAM: CT ANGIOGRAPHY CHEST WITH CONTRAST TECHNIQUE: Multidetector CT imaging of the chest was performed using the standard protocol during bolus administration of intravenous contrast. Multiplanar CT image reconstructions and MIPs were obtained to evaluate the vascular anatomy. CONTRAST:  44m OMNIPAQUE IOHEXOL 350 MG/ML SOLN COMPARISON:  None. FINDINGS: Cardiovascular: Satisfactory opacification of the pulmonary arteries to the segmental level. No evidence of pulmonary embolism. Normal heart size. No pericardial effusion. The thoracic aorta is of normal caliber. Mild atherosclerotic calcification within the descending thoracic aorta. Right internal jugular dual lumen chest port is seen with its tip within the terminal superior vena cava. Mediastinum/Nodes: No pathologic thoracic adenopathy. Thyroid unremarkable. Esophagus unremarkable. Lungs/Pleura: Moderate centrilobular emphysema. Biapical pleuroparenchymal scarring appears stable. No superimposed focal pulmonary nodules or infiltrates. No pneumothorax or pleural effusion. Central airways are widely patent. Upper Abdomen: No acute abnormality. Musculoskeletal: No chest wall abnormality. No acute or significant osseous findings. Review of the MIP images confirms the above findings. IMPRESSION: 1. No evidence of acute pulmonary embolism or other acute intrathoracic findings. Aortic Atherosclerosis (ICD10-I70.0) and Emphysema (ICD10-J43.9). Electronically Signed   By: AFidela SalisburyMD   On: 09/29/2019 21:37    EKG: Independently reviewed. Interpretation  : Normal sinus rhythm  Assessment/Plan 69year old female with history of non-Hodgkin's lymphoma receiving chemotherapy, HTN, , recently diagnosed with Covid on 09/23/2019 for which she received Regeneron l monoclonal antibody infusion on the day of arrival who was brought to the emergency room because of altered mental status.     Acute metabolic encephalopathy, possible CVA -Etiology uncertain, suspect TIA/CVA.  Unlikely related  to Covid or monoclonal antibody infusion -TIA/CVA among the differentials -Follow-up MRI to evaluate for acute CVA -Stroke work-up to include carotid Doppler and echocardiogram -Fall and aspiration precautions -Consider neurology and oncology consult in the a.m.    COVID-19 virus infection, breakthrough -No evidence of pneumonia -Patient received monoclonal antibodies on 09/29/2019 -Supportive care    Lymphoma ([place]) -Followed by oncology    Hypertension -Continue home ramipril    Hypokalemia -IV potassium repletion    DVT prophylaxis: Lovenox  Code Status: full code  Family Communication:  none  Disposition Plan: Back to previous home environment Consults called: none  Status: Observation   Athena Masse MD [facility]     09/30/2019, 12:07 AM

## 2019-09-30 NOTE — Consult Note (Signed)
Patient is a 69 year old female with a longstanding history of B-cell lymphoma initially diagnosed in 2003.  Patient underwent chemotherapy between December 2003 and January 2005.  She has subsequently noted to be in complete remission until June 2018 when PET scan revealed recurrence.  She was given salvage chemotherapy and then referred for autologous bone marrow transplant in October 2018.  Patient was then noted to have recurrence in 2020 and underwent additional chemotherapy between October 2020 and April 2021.  PET scan in June 2021 revealed complete remission.  Patient previously was on oral chemotherapy with Idelalisib, but this was discontinued in July 2021 secondary to significant side effects.  Patient has not been on treatment since that time.    She was recently diagnosed with Covid and also received monoclonal antibody infusion.  She presented to the emergency room with fever and altered mental status.  CT scan of the chest did not reveal any significant pathology or recurrence of disease.  Since patient has not had treatment for nearly 2 months, her symptoms are unlikely related to chemotherapy.  Chest CT does not indicate any evidence of recurrence.  Blood cultures are pending at time of dictation.  No oncology intervention is needed, but will continue to follow.  Upon discharge patient will need to follow-up with her primary oncologist at [facility] once her quarantine for Covid is completed.  Appreciate consult, will follow.

## 2019-09-30 NOTE — ED Notes (Signed)
Pt 89% RA while sleeping when this RN and Mimi RN entered room. Pt woken and now 100% RA. If pt desat again will place on 2L via [place]. Pt agreeable.

## 2019-09-30 NOTE — ED Notes (Signed)
Pt to MRI

## 2019-09-30 NOTE — ED Notes (Signed)
Pt up to bedside toilet attempting to provide stool sample.

## 2019-09-30 NOTE — Evaluation (Signed)
Occupational Therapy Evaluation Patient Details Name: Beth Stanley MRN: [id number] DOB: 1950/06/20 Today's Date: 09/30/2019    History of Present Illness 69 y.o. female with medical history significant for recurrent follicular lymphoma receiving chemotherapy, last 09/22/18,complicated by colitis,  HTN, , recently diagnosed with Covid on 09/27/2019 for which she received Regeneron l monoclonal antibody infusion on the day of arrival who was brought to the emergency room because of altered mental status.   Clinical Impression   Ms Swearengin was seen for OT evaluation this date. Prior to hospital admission, pt was Independent for mobility and ADLs, reports fear of falling in shower. Pt lives c husband. Pt presents to acute OT demonstrating impaired ADL performance and functional mobility 2/2 decreased activity tolerance and decreased safety awareness. Pt currently requires  Increased time don/doff B socks long sitting in bed, anticipate SUPERVISION for LBD in standing. MOD I for seated ADLs. Pt would benefit from skilled OT to address noted impairments and functional limitations (see below for any additional details) in order to maximize safety and independence while minimizing falls risk and caregiver burden. Upon hospital discharge, recommend HHOT to maximize pt safety and return to functional independence during meaningful occupations of daily life.    Follow Up Recommendations  Home health OT    Equipment Recommendations  3 in 1 bedside commode    Recommendations for Other Services       Precautions / Restrictions Precautions Precautions: Fall Restrictions Weight Bearing Restrictions: No      Mobility Bed Mobility Overal bed mobility: Modified Independent     General bed mobility comments: Increased time bed mobility  Transfers   General transfer comment: Pt deferred reporting fatigue r/t not eating in over 24hrs         ADL either performed or assessed with clinical judgement    ADL Overall ADL's : Needs assistance/impaired    General ADL Comments: Increased time don/doff B socks long sitting in bed, anticipate SUPERVISION for LBD in standing. MOD I for seated ADLs                  Pertinent Vitals/Pain Pain Assessment: No/denies pain     Hand Dominance Right   Extremity/Trunk Assessment Upper Extremity Assessment Upper Extremity Assessment: Overall WFL for tasks assessed   Lower Extremity Assessment Lower Extremity Assessment: Overall WFL for tasks assessed       Communication Communication Communication: No difficulties   Cognition Arousal/Alertness: Awake/alert Behavior During Therapy: WFL for tasks assessed/performed Overall Cognitive Status: Within Functional Limits for tasks assessed      General Comments: Requires repetition 2/2 HoH, husband reports near baseline cognition    General Comments       Exercises Exercises: Other exercises Other Exercises Other Exercises: Pt educated re: OT role, DME recs, d/c recs, ECS, home/routines modifications, falls preventions, safe transfer techniques Other Exercises: LBD, UBD, sup<>long sitting   Shoulder Instructions      Home Living Family/patient expects to be discharged to:: Private residence Living Arrangements: Spouse/significant other Available Help at Discharge: Family   Home Access: Stairs to enter Technical brewer of Steps: 7 Entrance Stairs-Rails: Can reach both       Bathroom Shower/Tub: Walk-in shower         Home Equipment: Kasandra Knudsen - single point;Walker - 2 wheels (grab bar outside of shower )          Prior Functioning/Environment Level of Independence: Independent        Comments: Pt will occasionally use a  cane out of the home but reports that typically she is able to drive, run errands, etc w/o AD and w/o assist.  Pt has had 4-5 falls in the last 6 months.        OT Problem List: Decreased activity tolerance;Decreased safety awareness      OT  Treatment/Interventions: Self-care/ADL training;Therapeutic exercise;Energy conservation;Therapeutic activities;Patient/family education    OT Goals(Current goals can be found in the care plan section) Acute Rehab OT Goals Patient Stated Goal: go home OT Goal Formulation: With patient Time For Goal Achievement: 10/14/19 Potential to Achieve Goals: Good ADL Goals Pt Will Perform Grooming: with modified independence;standing (c no cues for seated rest breaks) Pt Will Transfer to Toilet: with modified independence;ambulating;bedside commode (c LRAD PRN) Pt/caregiver will Perform Home Exercise Program: Increased strength;Both right and left upper extremity  OT Frequency: Min 1X/week    AM-PAC OT "6 Clicks" Daily Activity     Outcome Measure Help from another person eating meals?: None Help from another person taking care of personal grooming?: None Help from another person toileting, which includes using toliet, bedpan, or urinal?: A Little Help from another person bathing (including washing, rinsing, drying)?: A Little Help from another person to put on and taking off regular upper body clothing?: None Help from another person to put on and taking off regular lower body clothing?: A Little 6 Click Score: 21   End of Session    Activity Tolerance: Patient tolerated treatment well Patient left: in bed;with call bell/phone within reach  OT Visit Diagnosis: Other abnormalities of gait and mobility (R26.89)                Time: 4196-2229 OT Time Calculation (min): 14 min Charges:  OT General Charges $OT Visit: 1 Visit OT Evaluation $OT Eval Low Complexity: 1 Low OT Treatments $Self Care/Home Management : 8-22 mins  Dessie Coma, M.S. OTR/L  09/30/19, 2:10 PM  ascom [phone]

## 2019-09-30 NOTE — ED Notes (Signed)
Pt on her side while BP went off.

## 2019-09-30 NOTE — Progress Notes (Signed)
*  PRELIMINARY RESULTS* Echocardiogram 2D Echocardiogram has been performed.  Beth Stanley 09/30/2019, 10:23 AM

## 2019-09-30 NOTE — ED Notes (Signed)
Pt given dinner tray.

## 2019-09-30 NOTE — ED Notes (Signed)
Dr. Doristine Bosworth informed of patient's diarrhea. Patient is able to ambulated from stretcher to commode with steady gait. Georgie RN given report.

## 2019-09-30 NOTE — ED Notes (Signed)
Patient assisted to room commode. Patient was incontinent of stool and urine when standing. Patient was cleaned up, linens were changed.

## 2019-09-30 NOTE — Progress Notes (Addendum)
PROGRESS NOTE    Beth Stanley  SWF:[id number] DOB: January 20, 1950 DOA: 09/29/2019 PCP: Shannan Harper, MD   Brief Narrative:  HPI: Beth Stanley is a 69 y.o. female with medical history significant for recurrent follicular lymphoma receiving chemotherapy, last 09/22/18,complicated by colitis,  HTN, , recently diagnosed with Covid on 09/27/2019 for which she received Regeneron l monoclonal antibody infusion on the day of arrival who was brought to the emergency room because of altered mental status.  Patient had been doing well from a Covid standpoint with minimal shortness of breath and no cough however on the day of arrival she appeared to be lethargic and became progressively more so as the day progressed.  According to the husband at the bedside who gives most of the history, patient has had similar symptoms before related to dehydration related to chemotherapy side effects.  To date of weakness appeared worse to where she needed the assistance of others to be helped onto the stretcher.  He said earlier in the day they stopped at a restaurant and she was awake and alert but then became lethargic thereafter and worsened after her monoclonal antibody infusion.  She has had no focal weakness, numbness or tingling, visual disturbances and no falls.  History is limited due to altered mental status. ED Course: On arrival, she was tachycardic at 107, tachypneic at 32 with low-grade temperature of 99.6, BP 150/72.  Blood work notable for potassium of 2.7 but otherwise mostly unremarkable.  Covid biomarkers not done.  Chest x-ray was clear, head CT showed no acute intracranial abnormality but showed bilateral thalamic infarcts remote in nature.  She did have a CTA chest that showed no PE or other acute abnormalities.  Given finding of old thalamic infarcts, MRI was ordered from the ER to evaluate for stroke, currently pending at time of discharge.  Hospitalist consulted for admission.  Assessment & Plan:     Principal Problem:   Acute metabolic encephalopathy Active Problems:   Lymphoma ([place])   Hypertension   COVID-19 virus infection   Hypokalemia   Encephalopathy acute     Acute metabolic encephalopathy: CT head followed by MRI brain negative for any stroke.  Source remains unknown.  Patient back to baseline, alert and oriented.  Husband confirmed this at the bedside.  She passed swallow evaluation as well.  Ammonia negative.  Wondering if this is some sort of side effect of chemotherapy.  I have consulted oncology and sent a message to Dr. Grayland Ormond.  SIRS: Meets SIRS criteria based on fever with temperature of 102.9, tachycardia and tachypnea.  No leukocytosis.  UA unremarkable and chest x-ray negative as well.  Will order blood culture.  Unsure whether this is also secondary to receiving monoclonal antibodies or chemotherapy.  Awaiting oncology consultation.    COVID-19 virus infection, breakthrough -No evidence of pneumonia.  CT angiogram negative for PE.  Currently on 2 L but saturating 97%.  This is for comfort reason only. -Patient received monoclonal antibodies on 09/29/2019 -Continue supportive care    Lymphoma ([place]) -Followed by oncology.  Consulted them.  Essential hypertension: Blood pressure borderline low.  Hold ramipril.    Hypokalemia: 2.8.  Will replace more and recheck in the morning. -IV potassium repletion   DVT prophylaxis: enoxaparin (LOVENOX) injection 40 mg Start: 09/30/19 1000   Code Status: Full Code  Family Communication: Husband present at bedside.  Plan of care discussed with patient in length with patient and her husband and he verbalized understanding and  agreed with it.  Status is: Observation  The patient will require care spanning > 2 midnights and should be moved to inpatient because: Hemodynamically unstable  Dispo: The patient is from: Home              Anticipated d/c is to: Home              Anticipated d/c date is: 1 day               Patient currently is not medically stable to d/c.        Estimated body mass index is 24.45 kg/m as calculated from the following:   Height as of this encounter: 5\' 3"  (1.6 m).   Weight as of this encounter: 62.6 kg.      Nutritional status:               Consultants:   Oncology  Procedures:   None  Antimicrobials:  Anti-infectives (From admission, onward)   None         Subjective: Patient seen and examined.  Husband at the bedside.  Patient although slightly weak but was alert and oriented.  And husband also verified that this is her baseline.  Patient did not have any complaint.  Objective: Vitals:   09/30/19 0113 09/30/19 0130 09/30/19 0200 09/30/19 0603  BP:    (!) 92/54  Pulse:  93 96 72  Resp:  14  13  Temp: (!) 100.9 F (38.3 C)   98.8 F (37.1 C)  TempSrc: Oral   Oral  SpO2:  99% 96% 97%  Weight:      Height:        Intake/Output Summary (Last 24 hours) at 09/30/2019 1134 Last data filed at 09/30/2019 0903 Gross per 24 hour  Intake 200 ml  Output --  Net 200 ml   Filed Weights   09/29/19 2007  Weight: 62.6 kg    Examination:  General exam: Appears calm and comfortable  Respiratory system: Clear to auscultation. Respiratory effort normal. Cardiovascular system: S1 & S2 heard, RRR. No JVD, murmurs, rubs, gallops or clicks. No pedal edema. Gastrointestinal system: Abdomen is nondistended, soft and nontender. No organomegaly or masses felt. Normal bowel sounds heard. Central nervous system: Alert and oriented. No focal neurological deficits. Extremities: Symmetric 5 x 5 power. Skin: No rashes, lesions or ulcers Psychiatry: Judgement and insight appear normal. Mood & affect appropriate.    Data Reviewed: I have personally reviewed following labs and imaging studies  CBC: Recent Labs  Lab 09/29/19 1928 09/30/19 0905  WBC 6.9 5.4  NEUTROABS  --  1.7  HGB 10.9* 9.5*  HCT 33.0* 29.1*  MCV 92.7 93.0  PLT 139* 120*    Basic Metabolic Panel: Recent Labs  Lab 09/29/19 1928 09/30/19 0905  NA 143 140  K 2.7* 2.8*  CL 109 107  CO2 24 26  GLUCOSE 109* 101*  BUN 8 7*  CREATININE 0.65 0.54  CALCIUM 8.3* 7.7*  MG 1.8 1.8   GFR: Estimated Creatinine Clearance: 54.9 mL/min (by C-G formula based on SCr of 0.54 mg/dL). Liver Function Tests: Recent Labs  Lab 09/30/19 0905  AST 22  ALT 16  ALKPHOS 45  BILITOT 0.5  PROT 5.3*  ALBUMIN 3.0*   No results for input(s): LIPASE, AMYLASE in the last 168 hours. Recent Labs  Lab 09/30/19 0905  AMMONIA 24   Coagulation Profile: No results for input(s): INR, PROTIME in the last 168 hours. Cardiac Enzymes: No results  for input(s): CKTOTAL, CKMB, CKMBINDEX, TROPONINI in the last 168 hours. BNP (last 3 results) No results for input(s): PROBNP in the last 8760 hours. HbA1C: No results for input(s): HGBA1C in the last 72 hours. CBG: No results for input(s): GLUCAP in the last 168 hours. Lipid Profile: Recent Labs    09/30/19 0558  CHOL 129  HDL 33*  LDLCALC 67  TRIG 145  CHOLHDL 3.9   Thyroid Function Tests: No results for input(s): TSH, T4TOTAL, FREET4, T3FREE, THYROIDAB in the last 72 hours. Anemia Panel: No results for input(s): VITAMINB12, FOLATE, FERRITIN, TIBC, IRON, RETICCTPCT in the last 72 hours. Sepsis Labs: No results for input(s): PROCALCITON, LATICACIDVEN in the last 168 hours.  No results found for this or any previous visit (from the past 240 hour(s)).    Radiology Studies: DG Chest 1 View  Result Date: 09/29/2019 CLINICAL DATA:  Lethargy, altered level of consciousness, COVID-19 positive 09/23/2019, pain after infusion EXAM: CHEST  1 VIEW COMPARISON:  05/05/2017 FINDINGS: Frontal view of the chest demonstrates stable right chest wall port. Cardiac silhouette is unremarkable. There is background parenchymal lung scarring without focal consolidation, effusion, or pneumothorax. No acute bony abnormalities. IMPRESSION: 1. No  acute intrathoracic process. Electronically Signed   By: Randa Ngo M.D.   On: 09/29/2019 20:12   CT HEAD WO CONTRAST  Result Date: 09/29/2019 CLINICAL DATA:  Altered mental status EXAM: CT HEAD WITHOUT CONTRAST TECHNIQUE: Contiguous axial images were obtained from the base of the skull through the vertex without intravenous contrast. COMPARISON:  04/07/2016 FINDINGS: Brain: Normal anatomic configuration. Parenchymal volume loss is commensurate with the patient's age. Extensive periventricular white matter changes are present likely reflecting the sequela of small vessel ischemia. These appears slightly progressive when compared to prior examination. Bilateral thalamic infarcts are identified, new from prior examination, but remote in nature. No abnormal intra or extra-axial mass lesion or fluid collection. No abnormal mass effect or midline shift. No evidence of acute intracranial hemorrhage or infarct. Ventricular size is normal. Cerebellum unremarkable. Vascular: No asymmetric hyperdense vasculature at the skull base. Skull: Intact Sinuses/Orbits: There is moderate mucosal thickening within several ethmoid air cells bilaterally. Small layering fluid noted within the right sphenoid sinus. Remaining paranasal sinuses are clear. Orbits are unremarkable. Other: Mastoid air cells and middle ear cavities are clear. IMPRESSION: 1. No acute intracranial abnormality. 2. Extensive periventricular white matter changes likely reflecting the sequela of small vessel ischemia. These appears slightly progressive when compared to prior examination. 3. Bilateral thalamic infarcts, new from prior examination, but remote in nature. 4. Paranasal sinus disease as above. Electronically Signed   By: Fidela Salisbury MD   On: 09/29/2019 21:32   CT Angio Chest PE W and/or Wo Contrast  Result Date: 09/29/2019 CLINICAL DATA:  Altered mental status EXAM: CT ANGIOGRAPHY CHEST WITH CONTRAST TECHNIQUE: Multidetector CT imaging of the  chest was performed using the standard protocol during bolus administration of intravenous contrast. Multiplanar CT image reconstructions and MIPs were obtained to evaluate the vascular anatomy. CONTRAST:  2mL OMNIPAQUE IOHEXOL 350 MG/ML SOLN COMPARISON:  None. FINDINGS: Cardiovascular: Satisfactory opacification of the pulmonary arteries to the segmental level. No evidence of pulmonary embolism. Normal heart size. No pericardial effusion. The thoracic aorta is of normal caliber. Mild atherosclerotic calcification within the descending thoracic aorta. Right internal jugular dual lumen chest port is seen with its tip within the terminal superior vena cava. Mediastinum/Nodes: No pathologic thoracic adenopathy. Thyroid unremarkable. Esophagus unremarkable. Lungs/Pleura: Moderate centrilobular emphysema. Biapical pleuroparenchymal scarring  appears stable. No superimposed focal pulmonary nodules or infiltrates. No pneumothorax or pleural effusion. Central airways are widely patent. Upper Abdomen: No acute abnormality. Musculoskeletal: No chest wall abnormality. No acute or significant osseous findings. Review of the MIP images confirms the above findings. IMPRESSION: 1. No evidence of acute pulmonary embolism or other acute intrathoracic findings. Aortic Atherosclerosis (ICD10-I70.0) and Emphysema (ICD10-J43.9). Electronically Signed   By: Fidela Salisbury MD   On: 09/29/2019 21:37   MR BRAIN WO CONTRAST  Result Date: 09/30/2019 CLINICAL DATA:  Encephalopathy. History of non-Hodgkin's lymphoma undergoing chemotherapy. EXAM: MRI HEAD WITHOUT CONTRAST TECHNIQUE: Multiplanar, multiecho pulse sequences of the brain and surrounding structures were obtained without intravenous contrast. COMPARISON:  Brain MRI 04/30/2016 FINDINGS: Brain: No acute infarct, acute hemorrhage or extra-axial collection. Diffuse confluent hyperintense T2-weighted signal within the periventricular, deep and juxtacortical white matter has markedly  worsened. There is generalized atrophy without lobar predilection. No chronic microhemorrhage. Normal midline structures. Vascular: Normal flow voids. Skull and upper cervical spine: Normal marrow signal. Sinuses/Orbits: Fluid in the maxillary and sphenoid sinuses. Other: None IMPRESSION: 1. No acute intracranial abnormality. 2. Severely worsened white matter disease, which could be due to chronic microvascular ischemia or an effect of chemotherapy. 3. Fluid in the maxillary and sphenoid sinuses. Correlate for acute sinusitis. Electronically Signed   By: Ulyses Jarred M.D.   On: 09/30/2019 02:47   US Carotid Bilateral (at [facility] and [facility] only)  Result Date: 09/30/2019 CLINICAL DATA:  TIA. History of hypertension, hyperlipidemia, diabetes and smoking. EXAM: BILATERAL CAROTID DUPLEX ULTRASOUND TECHNIQUE: Pearline Cables scale imaging, color Doppler and duplex ultrasound were performed of bilateral carotid and vertebral arteries in the neck. COMPARISON:  None FINDINGS: Criteria: Quantification of carotid stenosis is based on velocity parameters that correlate the residual internal carotid diameter with NASCET-based stenosis levels, using the diameter of the distal internal carotid lumen as the denominator for stenosis measurement. The following velocity measurements were obtained: RIGHT ICA: 96/30 cm/sec CCA: 75/10 cm/sec SYSTOLIC ICA/CCA RATIO:  1.3 ECA: 56 cm/sec LEFT ICA: 75/27 cm/sec CCA: 25/85 cm/sec SYSTOLIC ICA/CCA RATIO:  1.0 ECA: 74 cm/sec RIGHT CAROTID ARTERY: There is a minimal amount of eccentric echogenic plaque within the right carotid bulb (image 19), extending to involve the origin and proximal aspects the right internal carotid artery (image 25), not resulting in elevated peak systolic velocities within the interrogated course of the right internal carotid artery to suggest a hemodynamically significant stenosis. RIGHT VERTEBRAL ARTERY:  Antegrade flow LEFT CAROTID ARTERY: There is a minimal amount of eccentric  echogenic plaque within the left carotid bulb (images 38, 39 and 52), extending to involve the origin and proximal aspects of the left internal carotid artery (image 58), not resulting in elevated peak systolic velocities within the interrogated course of the left internal carotid artery to suggest a hemodynamically significant stenosis. LEFT VERTEBRAL ARTERY:  Antegrade flow IMPRESSION: Minimal amount of bilateral atherosclerotic plaque, not resulting in a hemodynamically significant stenosis within either internal carotid artery. Electronically Signed   By: Sandi Mariscal M.D.   On: 09/30/2019 07:26    Scheduled Meds: .  stroke: mapping our early stages of recovery book   Does not apply Once  . enoxaparin (LOVENOX) injection  40 mg Subcutaneous Q24H  . potassium chloride  40 mEq Oral Q4H   Continuous Infusions: . sodium chloride 50 mL/hr at 09/30/19 0110     LOS: 0 days   Time spent: 35 minutes   Darliss Cheney, MD [facility]  09/30/2019,  11:34 AM   To contact the attending provider between 7A-7P or the covering provider during after hours 7P-7A, please log into the web site [email or web address].

## 2019-09-30 NOTE — ED Notes (Signed)
Pt assisted to bedside toilet. Pt had loose BM and urinated. Briefs changed. Pt provided self with peri care. Pt updated.

## 2019-09-30 NOTE — Evaluation (Signed)
Physical Therapy Evaluation Patient Details Name: Beth Stanley MRN: [id number] DOB: Dec 30, 1950 Today's Date: 09/30/2019   History of Present Illness  69 y.o. female with medical history significant for recurrent follicular lymphoma receiving chemotherapy, last 09/22/18,complicated by colitis,  HTN, , recently diagnosed with Covid last week for which she received Regeneron l monoclonal antibody infusion.  She was brought to the emergency room because of altered mental status, essentially back to baseline at time of exam.    Clinical Impression  Pt did well with PT exam showing good general insight and awareness despite some fatigue, she was able to ambulate in the room ~60 ft with slow but safe ambulation, no significant change in vitals with O2 remaining in the mid 90s on room air.  Pt is slower and more limited than her baseline, will benefit from HHPT to work back to more community appropriate, active levels.  Good effort, husband will be able to be around, safe to return home from PT standpoint.     Follow Up Recommendations Home health PT    Equipment Recommendations  None recommended by PT    Recommendations for Other Services       Precautions / Restrictions Precautions Precautions: Fall Restrictions Weight Bearing Restrictions: No      Mobility  Bed Mobility Overal bed mobility: Independent             General bed mobility comments: Pt able to get herself to EOB w/o assist, no safety concerns  Transfers Overall transfer level: Modified independent Equipment used: None             General transfer comment: Pt was able to rise to standing w/o direct assist, did not need excessive UE use, maintained forward flexed posture in standing  Ambulation/Gait Ambulation/Gait assistance: Supervision Gait Distance (Feet): 60 Feet Assistive device: None       General Gait Details: Initially gave light HHA but she quickly was able to let go of PT's hand and mange slow  but safe ambulation in the room.  Multiple turns w/o issue, O2 remained in the mid 90s on room air, no LOBs or overt safety issues; however slow and more guarded than her baseline  Stairs            Wheelchair Mobility    Modified Rankin (Stroke Patients Only)       Balance Overall balance assessment: Modified Independent (mild unsteadiness with no overt LOBs during activity)                                           Pertinent Vitals/Pain Pain Assessment: No/denies pain    Home Living Family/patient expects to be discharged to:: Private residence Living Arrangements: Spouse/significant other Available Help at Discharge: Family   Home Access: Stairs to enter Entrance Stairs-Rails: Can reach both Entrance Stairs-Number of Steps: 7   Home Equipment: Cane - single point;Walker - 2 wheels      Prior Function Level of Independence: Independent         Comments: Pt will occasionally use a cane out of the home but reports that typically she is able to drive, run errands, etc w/o AD and w/o assist.  Pt has had 4-5 falls in the last 6 months.     Hand Dominance        Extremity/Trunk Assessment   Upper Extremity Assessment Upper Extremity Assessment: Overall  WFL for tasks assessed    Lower Extremity Assessment Lower Extremity Assessment: Overall WFL for tasks assessed       Communication   Communication: No difficulties  Cognition Arousal/Alertness: Awake/alert Behavior During Therapy: WFL for tasks assessed/performed Overall Cognitive Status: Within Functional Limits for tasks assessed                                 General Comments: Pt much more clear than on arrival, aware of setting and time, alert to situation and self; per husband apart from being a little tired she seemed almost completely back to her normal      General Comments      Exercises     Assessment/Plan    PT Assessment Patient needs continued PT  services  PT Problem List Decreased activity tolerance;Decreased balance       PT Treatment Interventions Gait training;Stair training;Functional mobility training;Therapeutic activities;Therapeutic exercise;Balance training;Neuromuscular re-education;Patient/family education;DME instruction    PT Goals (Current goals can be found in the Care Plan section)  Acute Rehab PT Goals Patient Stated Goal: go home PT Goal Formulation: With patient Time For Goal Achievement: 10/14/19 Potential to Achieve Goals: Good    Frequency Min 2X/week   Barriers to discharge        Co-evaluation               AM-PAC PT "6 Clicks" Mobility  Outcome Measure Help needed turning from your back to your side while in a flat bed without using bedrails?: None Help needed moving from lying on your back to sitting on the side of a flat bed without using bedrails?: None Help needed moving to and from a bed to a chair (including a wheelchair)?: A Little Help needed standing up from a chair using your arms (e.g., wheelchair or bedside chair)?: A Little Help needed to walk in hospital room?: A Little Help needed climbing 3-5 steps with a railing? : A Little 6 Click Score: 20    End of Session   Activity Tolerance: Patient tolerated treatment well Patient left: in bed;with call bell/phone within reach;with family/visitor present;with nursing/sitter in room Nurse Communication: Mobility status PT Visit Diagnosis: Muscle weakness (generalized) (M62.81);Difficulty in walking, not elsewhere classified (R26.2);History of falling (Z91.81)    Time: 6773-7366 PT Time Calculation (min) (ACUTE ONLY): 24 min   Charges:   PT Evaluation $PT Eval Low Complexity: 1 Low          Kreg Shropshire, DPT 09/30/2019, 11:50 AM

## 2019-09-30 NOTE — ED Notes (Signed)
Resp reg/unlabored; pt denies SOB currently. Pt laying calmly in bed. Pt states she is going to sleep again so placed pt on 2L via [place]. Pt placed on cardiac monitor.

## 2019-10-01 DIAGNOSIS — A0472 Enterocolitis due to Clostridium difficile, not specified as recurrent: Secondary | ICD-10-CM | POA: Diagnosis present

## 2019-10-01 HISTORY — DX: Enterocolitis due to Clostridium difficile, not specified as recurrent: A04.72

## 2019-10-01 LAB — COMPREHENSIVE METABOLIC PANEL
ALT: 19 U/L (ref 0–44)
AST: 29 U/L (ref 15–41)
Albumin: 3.5 g/dL (ref 3.5–5.0)
Alkaline Phosphatase: 50 U/L (ref 38–126)
Anion gap: 9 (ref 5–15)
BUN: 10 mg/dL (ref 8–23)
CO2: 24 mmol/L (ref 22–32)
Calcium: 8.7 mg/dL — ABNORMAL LOW (ref 8.9–10.3)
Chloride: 108 mmol/L (ref 98–111)
Creatinine, Ser: 0.68 mg/dL (ref 0.44–1.00)
GFR calc Af Amer: >60 mL/min (ref >60)
GFR calc non Af Amer: >60 mL/min (ref >60)
Glucose, Bld: 121 mg/dL — ABNORMAL HIGH (ref 70–99)
Potassium: 3.9 mmol/L (ref 3.5–5.1)
Sodium: 141 mmol/L (ref 135–145)
Total Bilirubin: 0.6 mg/dL (ref 0.3–1.2)
Total Protein: 6.2 g/dL — ABNORMAL LOW (ref 6.5–8.1)

## 2019-10-01 LAB — CBC WITH DIFFERENTIAL/PLATELET
Abs Immature Granulocytes: 0.02 10*3/uL (ref 0.00–0.07)
Basophils Absolute: 0 10*3/uL (ref 0.0–0.1)
Basophils Relative: 0 %
Eosinophils Absolute: 0 10*3/uL (ref 0.0–0.5)
Eosinophils Relative: 0 %
HCT: 34 % — ABNORMAL LOW (ref 36.0–46.0)
Hemoglobin: 11 g/dL — ABNORMAL LOW (ref 12.0–15.0)
Immature Granulocytes: 0 %
Lymphocytes Relative: 40 %
Lymphs Abs: 2 10*3/uL (ref 0.7–4.0)
MCH: 30.1 pg (ref 26.0–34.0)
MCHC: 32.4 g/dL (ref 30.0–36.0)
MCV: 93.2 fL (ref 80.0–100.0)
Monocytes Absolute: 0.2 10*3/uL (ref 0.1–1.0)
Monocytes Relative: 5 %
Neutro Abs: 2.7 10*3/uL (ref 1.7–7.7)
Neutrophils Relative %: 55 %
Platelets: 134 10*3/uL — ABNORMAL LOW (ref 150–400)
RBC: 3.65 MIL/uL — ABNORMAL LOW (ref 3.87–5.11)
RDW: 15.9 % — ABNORMAL HIGH (ref 11.5–15.5)
WBC: 4.9 10*3/uL (ref 4.0–10.5)
nRBC: 0 % (ref 0.0–0.2)

## 2019-10-01 LAB — MAGNESIUM: Magnesium: 2.1 mg/dL (ref 1.7–2.4)

## 2019-10-01 MED ORDER — VANCOMYCIN 50 MG/ML ORAL SOLUTION: 125.0000 mg | ORAL | Status: DC

## 2019-10-01 MED ORDER — VANCOMYCIN 50 MG/ML ORAL SOLUTION
125.0000 mg | Freq: Four times a day (QID) | ORAL | Status: DC
  Administered 2019-10-01 – 2019-10-02 (×4): 125 mg via ORAL
  Filled 2019-10-01 (×10): qty 2.5

## 2019-10-01 MED ORDER — VANCOMYCIN 50 MG/ML ORAL SOLUTION: 125.0000 mg | Freq: Two times a day (BID) | ORAL | Status: DC

## 2019-10-01 MED ORDER — ENOXAPARIN SODIUM 40 MG/0.4ML ~~LOC~~ SOLN
40.0000 mg | SUBCUTANEOUS | Status: DC
  Administered 2019-10-01: 22:00:00 40 mg via SUBCUTANEOUS
  Filled 2019-10-01: qty 0.4

## 2019-10-01 MED ORDER — VANCOMYCIN 50 MG/ML ORAL SOLUTION: 125.0000 mg | Freq: Every day | ORAL | Status: DC

## 2019-10-01 NOTE — ED Notes (Signed)
Pt assisted back to bed. Pt had loose BM. Pt provided self with peri care. Pt given new briefs as requested.

## 2019-10-01 NOTE — Progress Notes (Deleted)
RT Dee reported to this RN that patient states he feels as if he is febrile and wants a sleep aid. His temp registered as 99.4. He reports taking Melatonin at home for insomnia. Sending provider a secure message to request order for sleep aid.

## 2019-10-01 NOTE — Progress Notes (Signed)
Patient arrived to unit from ER in stable condition. Pt ambulated from stretcher to bed with standby assist only.

## 2019-10-01 NOTE — Progress Notes (Signed)
PROGRESS NOTE    Beth Stanley  BWI:[id number] DOB: 1950/10/21 DOA: 09/29/2019 PCP: Shannan Harper, MD   Brief Narrative:  HPI: Beth Stanley is a 69 y.o. female with medical history significant for recurrent follicular lymphoma receiving chemotherapy, last 09/22/18,complicated by colitis,  HTN, , recently diagnosed with Covid on 09/27/2019 for which she received Regeneron l monoclonal antibody infusion on the day of arrival who was brought to the emergency room because of altered mental status.  Patient had been doing well from a Covid standpoint with minimal shortness of breath and no cough however on the day of arrival she appeared to be lethargic and became progressively more so as the day progressed.  According to the husband at the bedside who gives most of the history, patient has had similar symptoms before related to dehydration related to chemotherapy side effects.  To date of weakness appeared worse to where she needed the assistance of others to be helped onto the stretcher.  He said earlier in the day they stopped at a restaurant and she was awake and alert but then became lethargic thereafter and worsened after her monoclonal antibody infusion.  She has had no focal weakness, numbness or tingling, visual disturbances and no falls.  History is limited due to altered mental status. ED Course: On arrival, she was tachycardic at 107, tachypneic at 32 with low-grade temperature of 99.6, BP 150/72.  Blood work notable for potassium of 2.7 but otherwise mostly unremarkable.  Covid biomarkers not done.  Chest x-ray was clear, head CT showed no acute intracranial abnormality but showed bilateral thalamic infarcts remote in nature.  She did have a CTA chest that showed no PE or other acute abnormalities.  Given finding of old thalamic infarcts, MRI was ordered from the ER to evaluate for stroke, currently pending at time of discharge.  Hospitalist consulted for admission.  Assessment & Plan:     Principal Problem:   Acute metabolic encephalopathy Active Problems:   Lymphoma ([place])   Hypertension   COVID-19 virus infection   Hypokalemia   Encephalopathy acute   C. difficile colitis   Acute metabolic encephalopathy: CT head followed by MRI brain negative for any stroke.  Source remains unknown.  Patient back to baseline, alert and oriented. Ammonia negative.  Out of concern that her acute encephalopathy could very well be due to chemotherapy, oncology was consulted however according to their note, "her symptoms are unlikely related to chemotherapy" patient needs to follow-up with primary oncologist at [facility] once quarantine is completed.  We then found out that patient was having diarrhea.  She was tested positive for C. difficile.  Met sepsis criteria.  Her acute encephalopathy was likely secondary to sepsis which has resolved now.  Sepsis/C nephritis: Meets sepsis criteria based on fever with temperature of 102.9, tachycardia and tachypnea and source of infection likely C. difficile colitis.  No leukocytosis.  UA unremarkable and chest x-ray negative as well.  C. difficile was ordered on the morning of 09/30/2019.  It sounds like patient's results/PCR was positive around 6 PM on the same day however MD/writer was not notified of the positive results.  Found out about this while reviewing chart this morning.  Started patient on vancomycin oral this morning.  Patient is still complaining of diarrhea.    COVID-19 virus infection, breakthrough -No evidence of pneumonia.  CT angiogram negative for PE.  Currently on 2 L but saturating 97%.  This is for comfort reason only. -Patient  received monoclonal antibodies on 09/29/2019 -Continue supportive care    Lymphoma ([place]) -Followed by oncology.  Consulted them.  Essential hypertension: Blood pressure borderline low.  Hold ramipril.    Hypokalemia: Was replaced yesterday.  Waiting for morning labs.   DVT prophylaxis:     Code Status: Full Code  Family Communication: None present at bedside.  Patient alert and oriented.  Plan of care discussed with patient.  Status is: Inpatient  The patient will require care spanning > 2 midnights and should be moved to inpatient because: Hemodynamically unstable  Dispo: The patient is from: Home              Anticipated d/c is to: Home              Anticipated d/c date is: 1 day              Patient currently is not medically stable to d/c.        Estimated body mass index is 24.45 kg/m as calculated from the following:   Height as of this encounter: '5\' 3"'  (1.6 m).   Weight as of this encounter: 62.6 kg.      Nutritional status:               Consultants:   Oncology  Procedures:   None  Antimicrobials:  Anti-infectives (From admission, onward)   Start     Dose/Rate Route Frequency Ordered Stop   11/06/19 1000  vancomycin (VANCOCIN) 50 mg/mL oral solution 125 mg       "Followed by" Linked Group Details   125 mg Oral Every 3 DAYS 10/01/19 0804 11/15/19 0959   10/29/19 1000  vancomycin (VANCOCIN) 50 mg/mL oral solution 125 mg       "Followed by" Linked Group Details   125 mg Oral Every other day 10/01/19 0804 11/06/19 0959   10/22/19 1000  vancomycin (VANCOCIN) 50 mg/mL oral solution 125 mg       "Followed by" Linked Group Details   125 mg Oral Daily 10/01/19 0804 10/29/19 0959   10/15/19 1000  vancomycin (VANCOCIN) 50 mg/mL oral solution 125 mg       "Followed by" Linked Group Details   125 mg Oral 2 times daily 10/01/19 0804 10/22/19 0959   10/01/19 1000  vancomycin (VANCOCIN) 50 mg/mL oral solution 125 mg       "Followed by" Linked Group Details   125 mg Oral 4 times daily 10/01/19 0804 10/15/19 0959   09/30/19 1145  sulfamethoxazole-trimethoprim (BACTRIM DS) 800-160 MG per tablet 1 tablet       Note to Pharmacy: Monday Wednesday FRIDAY     1 tablet Oral Once per day on Mon Wed Fri 09/30/19 1141           Subjective: Seen and  examined.  Still complains of diarrhea.  Alert, awake and oriented x4.  No other complaint.  Objective: Vitals:   10/01/19 0500 10/01/19 0600 10/01/19 0700 10/01/19 0801  BP: 134/65 (!) 141/58 (!) 132/57 128/66  Pulse: 68 71 67 70  Resp: '11 18 15 12  ' Temp:      TempSrc:      SpO2: 99% 99% 98% 98%  Weight:      Height:        Intake/Output Summary (Last 24 hours) at 10/01/2019 0808 Last data filed at 09/30/2019 0903 Gross per 24 hour  Intake 100 ml  Output --  Net 100 ml   [employer]   09/29/19  2007  Weight: 62.6 kg    Examination:  General exam: Appears calm and comfortable  Respiratory system: Clear to auscultation. Respiratory effort normal. Cardiovascular system: S1 & S2 heard, RRR. No JVD, murmurs, rubs, gallops or clicks. No pedal edema. Gastrointestinal system: Abdomen is nondistended, soft and nontender. No organomegaly or masses felt. Normal bowel sounds heard. Central nervous system: Alert and oriented. No focal neurological deficits. Extremities: Symmetric 5 x 5 power. Skin: No rashes, lesions or ulcers.  Psychiatry: Judgement and insight appear normal. Mood & affect appropriate.    Data Reviewed: I have personally reviewed following labs and imaging studies  CBC: Recent Labs  Lab 09/29/19 1928 09/30/19 0905  WBC 6.9 5.4  NEUTROABS  --  1.7  HGB 10.9* 9.5*  HCT 33.0* 29.1*  MCV 92.7 93.0  PLT 139* 212*   Basic Metabolic Panel: Recent Labs  Lab 09/29/19 1928 09/30/19 0905  NA 143 140  K 2.7* 2.8*  CL 109 107  CO2 24 26  GLUCOSE 109* 101*  BUN 8 7*  CREATININE 0.65 0.54  CALCIUM 8.3* 7.7*  MG 1.8 1.8   GFR: Estimated Creatinine Clearance: 54.9 mL/min (by C-G formula based on SCr of 0.54 mg/dL). Liver Function Tests: Recent Labs  Lab 09/30/19 0905  AST 22  ALT 16  ALKPHOS 45  BILITOT 0.5  PROT 5.3*  ALBUMIN 3.0*   No results for input(s): LIPASE, AMYLASE in the last 168 hours. Recent Labs  Lab 09/30/19 0905  AMMONIA 24    Coagulation Profile: No results for input(s): INR, PROTIME in the last 168 hours. Cardiac Enzymes: No results for input(s): CKTOTAL, CKMB, CKMBINDEX, TROPONINI in the last 168 hours. BNP (last 3 results) No results for input(s): PROBNP in the last 8760 hours. HbA1C: Recent Labs    09/30/19 0558  HGBA1C 6.6*   CBG: No results for input(s): GLUCAP in the last 168 hours. Lipid Profile: Recent Labs    09/30/19 0558  CHOL 129  HDL 33*  LDLCALC 67  TRIG 145  CHOLHDL 3.9   Thyroid Function Tests: No results for input(s): TSH, T4TOTAL, FREET4, T3FREE, THYROIDAB in the last 72 hours. Anemia Panel: No results for input(s): VITAMINB12, FOLATE, FERRITIN, TIBC, IRON, RETICCTPCT in the last 72 hours. Sepsis Labs: No results for input(s): PROCALCITON, LATICACIDVEN in the last 168 hours.  Recent Results (from the past 240 hour(s))  CULTURE, BLOOD (ROUTINE X 2) w Reflex to ID Panel     Status: None (Preliminary result)   Collection Time: 09/30/19 12:55 PM   Specimen: BLOOD  Result Value Ref Range Status   Specimen Description BLOOD BLOOD RIGHT FOREARM  Final   Special Requests   Final    BOTTLES DRAWN AEROBIC AND ANAEROBIC Blood Culture results may not be optimal due to an inadequate volume of blood received in culture bottles   Culture   Final    NO GROWTH < 24 HOURS Performed at [facility], [address], [place], [place] [address]    Report Status PENDING  Incomplete  CULTURE, BLOOD (ROUTINE X 2) w Reflex to ID Panel     Status: None (Preliminary result)   Collection Time: 09/30/19  1:07 PM   Specimen: BLOOD  Result Value Ref Range Status   Specimen Description BLOOD BLOOD LEFT HAND  Final   Special Requests   Final    BOTTLES DRAWN AEROBIC AND ANAEROBIC Blood Culture adequate volume   Culture   Final    NO GROWTH < 24 HOURS  Performed at [facility], [address] [place], [place] [address]    Report Status PENDING  Incomplete  C Difficile Quick  Screen w PCR reflex     Status: Abnormal   Collection Time: 09/30/19  4:26 PM   Specimen: STOOL  Result Value Ref Range Status   C Diff antigen POSITIVE (A) NEGATIVE Final   C Diff toxin NEGATIVE NEGATIVE Final   C Diff interpretation Results are indeterminate. See PCR results.  Final    Comment: Performed at [facility], [address] [place], [place] [address]  C. Diff by PCR, Reflexed     Status: Abnormal   Collection Time: 09/30/19  4:26 PM  Result Value Ref Range Status   Toxigenic C. Difficile by PCR POSITIVE (A) NEGATIVE Final    Comment: Positive for toxigenic C. difficile with little to no toxin production. Only treat if clinical presentation suggests symptomatic illness. Performed at [facility], [address], [place], [place] [address]       Radiology Studies: DG Chest 1 View  Result Date: 09/29/2019 CLINICAL DATA:  Lethargy, altered level of consciousness, COVID-19 positive 09/23/2019, pain after infusion EXAM: CHEST  1 VIEW COMPARISON:  05/05/2017 FINDINGS: Frontal view of the chest demonstrates stable right chest wall port. Cardiac silhouette is unremarkable. There is background parenchymal lung scarring without focal consolidation, effusion, or pneumothorax. No acute bony abnormalities. IMPRESSION: 1. No acute intrathoracic process. Electronically Signed   By: Randa Ngo M.D.   On: 09/29/2019 20:12   CT HEAD WO CONTRAST  Result Date: 09/29/2019 CLINICAL DATA:  Altered mental status EXAM: CT HEAD WITHOUT CONTRAST TECHNIQUE: Contiguous axial images were obtained from the base of the skull through the vertex without intravenous contrast. COMPARISON:  04/07/2016 FINDINGS: Brain: Normal anatomic configuration. Parenchymal volume loss is commensurate with the patient's age. Extensive periventricular white matter changes are present likely reflecting the sequela of small vessel ischemia. These appears slightly progressive when compared to prior  examination. Bilateral thalamic infarcts are identified, new from prior examination, but remote in nature. No abnormal intra or extra-axial mass lesion or fluid collection. No abnormal mass effect or midline shift. No evidence of acute intracranial hemorrhage or infarct. Ventricular size is normal. Cerebellum unremarkable. Vascular: No asymmetric hyperdense vasculature at the skull base. Skull: Intact Sinuses/Orbits: There is moderate mucosal thickening within several ethmoid air cells bilaterally. Small layering fluid noted within the right sphenoid sinus. Remaining paranasal sinuses are clear. Orbits are unremarkable. Other: Mastoid air cells and middle ear cavities are clear. IMPRESSION: 1. No acute intracranial abnormality. 2. Extensive periventricular white matter changes likely reflecting the sequela of small vessel ischemia. These appears slightly progressive when compared to prior examination. 3. Bilateral thalamic infarcts, new from prior examination, but remote in nature. 4. Paranasal sinus disease as above. Electronically Signed   By: Fidela Salisbury MD   On: 09/29/2019 21:32   CT Angio Chest PE W and/or Wo Contrast  Result Date: 09/29/2019 CLINICAL DATA:  Altered mental status EXAM: CT ANGIOGRAPHY CHEST WITH CONTRAST TECHNIQUE: Multidetector CT imaging of the chest was performed using the standard protocol during bolus administration of intravenous contrast. Multiplanar CT image reconstructions and MIPs were obtained to evaluate the vascular anatomy. CONTRAST:  47m OMNIPAQUE IOHEXOL 350 MG/ML SOLN COMPARISON:  None. FINDINGS: Cardiovascular: Satisfactory opacification of the pulmonary arteries to the segmental level. No evidence of pulmonary embolism. Normal heart size. No pericardial effusion. The thoracic aorta is of normal caliber. Mild atherosclerotic calcification within  the descending thoracic aorta. Right internal jugular dual lumen chest port is seen with its tip within the terminal superior  vena cava. Mediastinum/Nodes: No pathologic thoracic adenopathy. Thyroid unremarkable. Esophagus unremarkable. Lungs/Pleura: Moderate centrilobular emphysema. Biapical pleuroparenchymal scarring appears stable. No superimposed focal pulmonary nodules or infiltrates. No pneumothorax or pleural effusion. Central airways are widely patent. Upper Abdomen: No acute abnormality. Musculoskeletal: No chest wall abnormality. No acute or significant osseous findings. Review of the MIP images confirms the above findings. IMPRESSION: 1. No evidence of acute pulmonary embolism or other acute intrathoracic findings. Aortic Atherosclerosis (ICD10-I70.0) and Emphysema (ICD10-J43.9). Electronically Signed   By: Fidela Salisbury MD   On: 09/29/2019 21:37   MR BRAIN WO CONTRAST  Result Date: 09/30/2019 CLINICAL DATA:  Encephalopathy. History of non-Hodgkin's lymphoma undergoing chemotherapy. EXAM: MRI HEAD WITHOUT CONTRAST TECHNIQUE: Multiplanar, multiecho pulse sequences of the brain and surrounding structures were obtained without intravenous contrast. COMPARISON:  Brain MRI 04/30/2016 FINDINGS: Brain: No acute infarct, acute hemorrhage or extra-axial collection. Diffuse confluent hyperintense T2-weighted signal within the periventricular, deep and juxtacortical white matter has markedly worsened. There is generalized atrophy without lobar predilection. No chronic microhemorrhage. Normal midline structures. Vascular: Normal flow voids. Skull and upper cervical spine: Normal marrow signal. Sinuses/Orbits: Fluid in the maxillary and sphenoid sinuses. Other: None IMPRESSION: 1. No acute intracranial abnormality. 2. Severely worsened white matter disease, which could be due to chronic microvascular ischemia or an effect of chemotherapy. 3. Fluid in the maxillary and sphenoid sinuses. Correlate for acute sinusitis. Electronically Signed   By: Ulyses Jarred M.D.   On: 09/30/2019 02:47   US Carotid Bilateral (at [facility] and [facility]  only)  Result Date: 09/30/2019 CLINICAL DATA:  TIA. History of hypertension, hyperlipidemia, diabetes and smoking. EXAM: BILATERAL CAROTID DUPLEX ULTRASOUND TECHNIQUE: Pearline Cables scale imaging, color Doppler and duplex ultrasound were performed of bilateral carotid and vertebral arteries in the neck. COMPARISON:  None FINDINGS: Criteria: Quantification of carotid stenosis is based on velocity parameters that correlate the residual internal carotid diameter with NASCET-based stenosis levels, using the diameter of the distal internal carotid lumen as the denominator for stenosis measurement. The following velocity measurements were obtained: RIGHT ICA: 96/30 cm/sec CCA: 43/60 cm/sec SYSTOLIC ICA/CCA RATIO:  1.3 ECA: 56 cm/sec LEFT ICA: 75/27 cm/sec CCA: 67/70 cm/sec SYSTOLIC ICA/CCA RATIO:  1.0 ECA: 74 cm/sec RIGHT CAROTID ARTERY: There is a minimal amount of eccentric echogenic plaque within the right carotid bulb (image 19), extending to involve the origin and proximal aspects the right internal carotid artery (image 25), not resulting in elevated peak systolic velocities within the interrogated course of the right internal carotid artery to suggest a hemodynamically significant stenosis. RIGHT VERTEBRAL ARTERY:  Antegrade flow LEFT CAROTID ARTERY: There is a minimal amount of eccentric echogenic plaque within the left carotid bulb (images 38, 39 and 52), extending to involve the origin and proximal aspects of the left internal carotid artery (image 58), not resulting in elevated peak systolic velocities within the interrogated course of the left internal carotid artery to suggest a hemodynamically significant stenosis. LEFT VERTEBRAL ARTERY:  Antegrade flow IMPRESSION: Minimal amount of bilateral atherosclerotic plaque, not resulting in a hemodynamically significant stenosis within either internal carotid artery. Electronically Signed   By: Sandi Mariscal M.D.   On: 09/30/2019 07:26   ECHOCARDIOGRAM COMPLETE  Result  Date: 09/30/2019    ECHOCARDIOGRAM REPORT   Patient Name:   Beth Stanley Date of Exam: 09/30/2019 Medical Rec #:  [id number]  Height:       63.0 in Accession #:    [id number]       Weight:       138.0 lb Date of Birth:  June 20, 1950        BSA:          1.652 m Patient Age:    32 years         BP:           92/54 mmHg Patient Gender: F                HR:           75 bpm. Exam Location:  [facility] Procedure: 2D Echo, Color Doppler and Cardiac Doppler Indications:     G45.9 TIA  History:         Patient has no prior history of Echocardiogram examinations.                  Risk Factors:Hypertension. Pt tested positive for COVID-19 on                  10/03/19.  Sonographer:     Charmayne Sheer RDCS (AE) Referring Phys:  [id number] Athena Masse Diagnosing Phys: Ida Rogue MD  Sonographer Comments: Suboptimal parasternal window. IMPRESSIONS  1. Left ventricular ejection fraction, by estimation, is 55 to 60%. The left ventricle has normal function. The left ventricle has no regional wall motion abnormalities. Left ventricular diastolic parameters are consistent with Grade I diastolic dysfunction (impaired relaxation).  2. Right ventricular systolic function is normal. The right ventricular size is normal. FINDINGS  Left Ventricle: Left ventricular ejection fraction, by estimation, is 55 to 60%. The left ventricle has normal function. The left ventricle has no regional wall motion abnormalities. The left ventricular internal cavity size was normal in size. There is  no left ventricular hypertrophy. Left ventricular diastolic parameters are consistent with Grade I diastolic dysfunction (impaired relaxation). Right Ventricle: The right ventricular size is normal. No increase in right ventricular wall thickness. Right ventricular systolic function is normal. Left Atrium: Left atrial size was normal in size. Right Atrium: Right atrial size was normal in size. Pericardium: There is no evidence of pericardial effusion. Mitral  Valve: The mitral valve is normal in structure. No evidence of mitral valve regurgitation. No evidence of mitral valve stenosis. MV peak gradient, 9.4 mmHg. The mean mitral valve gradient is 3.0 mmHg. Tricuspid Valve: The tricuspid valve is normal in structure. Tricuspid valve regurgitation is not demonstrated. No evidence of tricuspid stenosis. Aortic Valve: The aortic valve is normal in structure. Aortic valve regurgitation is not visualized. No aortic stenosis is present. Aortic valve mean gradient measures 4.0 mmHg. Aortic valve peak gradient measures 7.8 mmHg. Aortic valve area, by VTI measures 2.01 cm. Pulmonic Valve: The pulmonic valve was normal in structure. Pulmonic valve regurgitation is not visualized. No evidence of pulmonic stenosis. Aorta: The aortic root is normal in size and structure. Venous: The inferior vena cava is normal in size with greater than 50% respiratory variability, suggesting right atrial pressure of 3 mmHg. IAS/Shunts: No atrial level shunt detected by color flow Doppler.  LEFT VENTRICLE PLAX 2D LVIDd:         4.37 cm  Diastology LVIDs:         3.03 cm  LV e' medial:    5.87 cm/s LV PW:         0.88 cm  LV E/e' medial:  17.7 LV IVS:  0.73 cm  LV e' lateral:   6.85 cm/s LVOT diam:     2.00 cm  LV E/e' lateral: 15.2 LV SV:         53 LV SV Index:   32 LVOT Area:     3.14 cm  LEFT ATRIUM             Index LA diam:        3.00 cm 1.82 cm/m LA Vol (A2C):   17.0 ml 10.29 ml/m LA Vol (A4C):   29.8 ml 18.04 ml/m LA Biplane Vol: 24.3 ml 14.71 ml/m  AORTIC VALVE AV Area (Vmax):    2.22 cm AV Area (Vmean):   2.29 cm AV Area (VTI):     2.01 cm AV Vmax:           140.00 cm/s AV Vmean:          95.800 cm/s AV VTI:            0.263 m AV Peak Grad:      7.8 mmHg AV Mean Grad:      4.0 mmHg LVOT Vmax:         99.00 cm/s LVOT Vmean:        69.700 cm/s LVOT VTI:          0.168 m LVOT/AV VTI ratio: 0.64  AORTA Ao Root diam: 2.40 cm MITRAL VALVE MV Area (PHT): 3.60 cm     SHUNTS MV Peak  grad:  9.4 mmHg     Systemic VTI:  0.17 m MV Mean grad:  3.0 mmHg     Systemic Diam: 2.00 cm MV Vmax:       1.53 m/s MV Vmean:      82.5 cm/s MV Decel Time: 211 msec MV E velocity: 104.00 cm/s MV A velocity: 126.00 cm/s MV E/A ratio:  0.83 Ida Rogue MD Electronically signed by Ida Rogue MD Signature Date/Time: 09/30/2019/1:29:51 PM    Final     Scheduled Meds: .  stroke: mapping our early stages of recovery book   Does not apply Once  . ferrous sulfate  325 mg Oral Once per day on Mon Wed Fri  . ketorolac  1 drop Left Eye QID  . pantoprazole  40 mg Oral Daily  . prednisoLONE acetate  1 drop Left Eye q morning - 10a  . predniSONE  5 mg Oral Q breakfast  . sertraline  150 mg Oral Daily  . sulfamethoxazole-trimethoprim  1 tablet Oral Once per day on Mon Wed Fri  . vancomycin  125 mg Oral QID   Followed by  . [START ON 10/15/2019] vancomycin  125 mg Oral BID   Followed by  . [START ON 10/22/2019] vancomycin  125 mg Oral Daily   Followed by  . [START ON 10/29/2019] vancomycin  125 mg Oral QODAY   Followed by  . [START ON 11/06/2019] vancomycin  125 mg Oral Q3 days   Continuous Infusions: . sodium chloride Stopped (09/30/19 1450)     LOS: 1 day   Time spent: 30 minutes   Darliss Cheney, MD [facility]  10/01/2019, 8:08 AM   To contact the attending provider between 7A-7P or the covering provider during after hours 7P-7A, please log into the web site [email or web address].

## 2019-10-01 NOTE — ED Notes (Signed)
Lab has been called to collect morning Labs. Lab is aware that patient is going to 1C-101.

## 2019-10-01 NOTE — ED Notes (Signed)
Pt used call bell to notify she needs to use restroom. Assisted up to bedside toilet.

## 2019-10-01 NOTE — ED Notes (Signed)
Pt assisted to toilet and diarrhea evident in bed, pt cleaned and gown and bedding changed

## 2019-10-02 ENCOUNTER — Inpatient Hospital Stay: Payer: PPO

## 2019-10-02 LAB — CBC WITH DIFFERENTIAL/PLATELET
Abs Immature Granulocytes: 0.02 10*3/uL (ref 0.00–0.07)
Basophils Absolute: 0 10*3/uL (ref 0.0–0.1)
Basophils Relative: 0 %
Eosinophils Absolute: 0 10*3/uL (ref 0.0–0.5)
Eosinophils Relative: 0 %
HCT: 30.3 % — ABNORMAL LOW (ref 36.0–46.0)
Hemoglobin: 10.3 g/dL — ABNORMAL LOW (ref 12.0–15.0)
Immature Granulocytes: 0 %
Lymphocytes Relative: 51 %
Lymphs Abs: 2.8 10*3/uL (ref 0.7–4.0)
MCH: 30.2 pg (ref 26.0–34.0)
MCHC: 34 g/dL (ref 30.0–36.0)
MCV: 88.9 fL (ref 80.0–100.0)
Monocytes Absolute: 0.4 10*3/uL (ref 0.1–1.0)
Monocytes Relative: 8 %
Neutro Abs: 2.3 10*3/uL (ref 1.7–7.7)
Neutrophils Relative %: 41 %
Platelets: 139 10*3/uL — ABNORMAL LOW (ref 150–400)
RBC: 3.41 MIL/uL — ABNORMAL LOW (ref 3.87–5.11)
RDW: 15.6 % — ABNORMAL HIGH (ref 11.5–15.5)
WBC: 5.6 10*3/uL (ref 4.0–10.5)
nRBC: 0 % (ref 0.0–0.2)

## 2019-10-02 LAB — COMPREHENSIVE METABOLIC PANEL
ALT: 17 U/L (ref 0–44)
AST: 24 U/L (ref 15–41)
Albumin: 3.4 g/dL — ABNORMAL LOW (ref 3.5–5.0)
Alkaline Phosphatase: 58 U/L (ref 38–126)
Anion gap: 10 (ref 5–15)
BUN: 13 mg/dL (ref 8–23)
CO2: 23 mmol/L (ref 22–32)
Calcium: 8.6 mg/dL — ABNORMAL LOW (ref 8.9–10.3)
Chloride: 109 mmol/L (ref 98–111)
Creatinine, Ser: 0.73 mg/dL (ref 0.44–1.00)
GFR calc Af Amer: >60 mL/min (ref >60)
GFR calc non Af Amer: >60 mL/min (ref >60)
Glucose, Bld: 181 mg/dL — ABNORMAL HIGH (ref 70–99)
Potassium: 3 mmol/L — ABNORMAL LOW (ref 3.5–5.1)
Sodium: 142 mmol/L (ref 135–145)
Total Bilirubin: 0.4 mg/dL (ref 0.3–1.2)
Total Protein: 5.7 g/dL — ABNORMAL LOW (ref 6.5–8.1)

## 2019-10-02 IMAGING — DX DG HAND 2V*R*
2 series · 2 of 2 positions shown · non-contrast
Comparison: None.

CLINICAL DATA: Right hand pain near the distal aspect of the third
metacarpal.

EXAM:
RIGHT HAND - 2 VIEW

[hand ap]
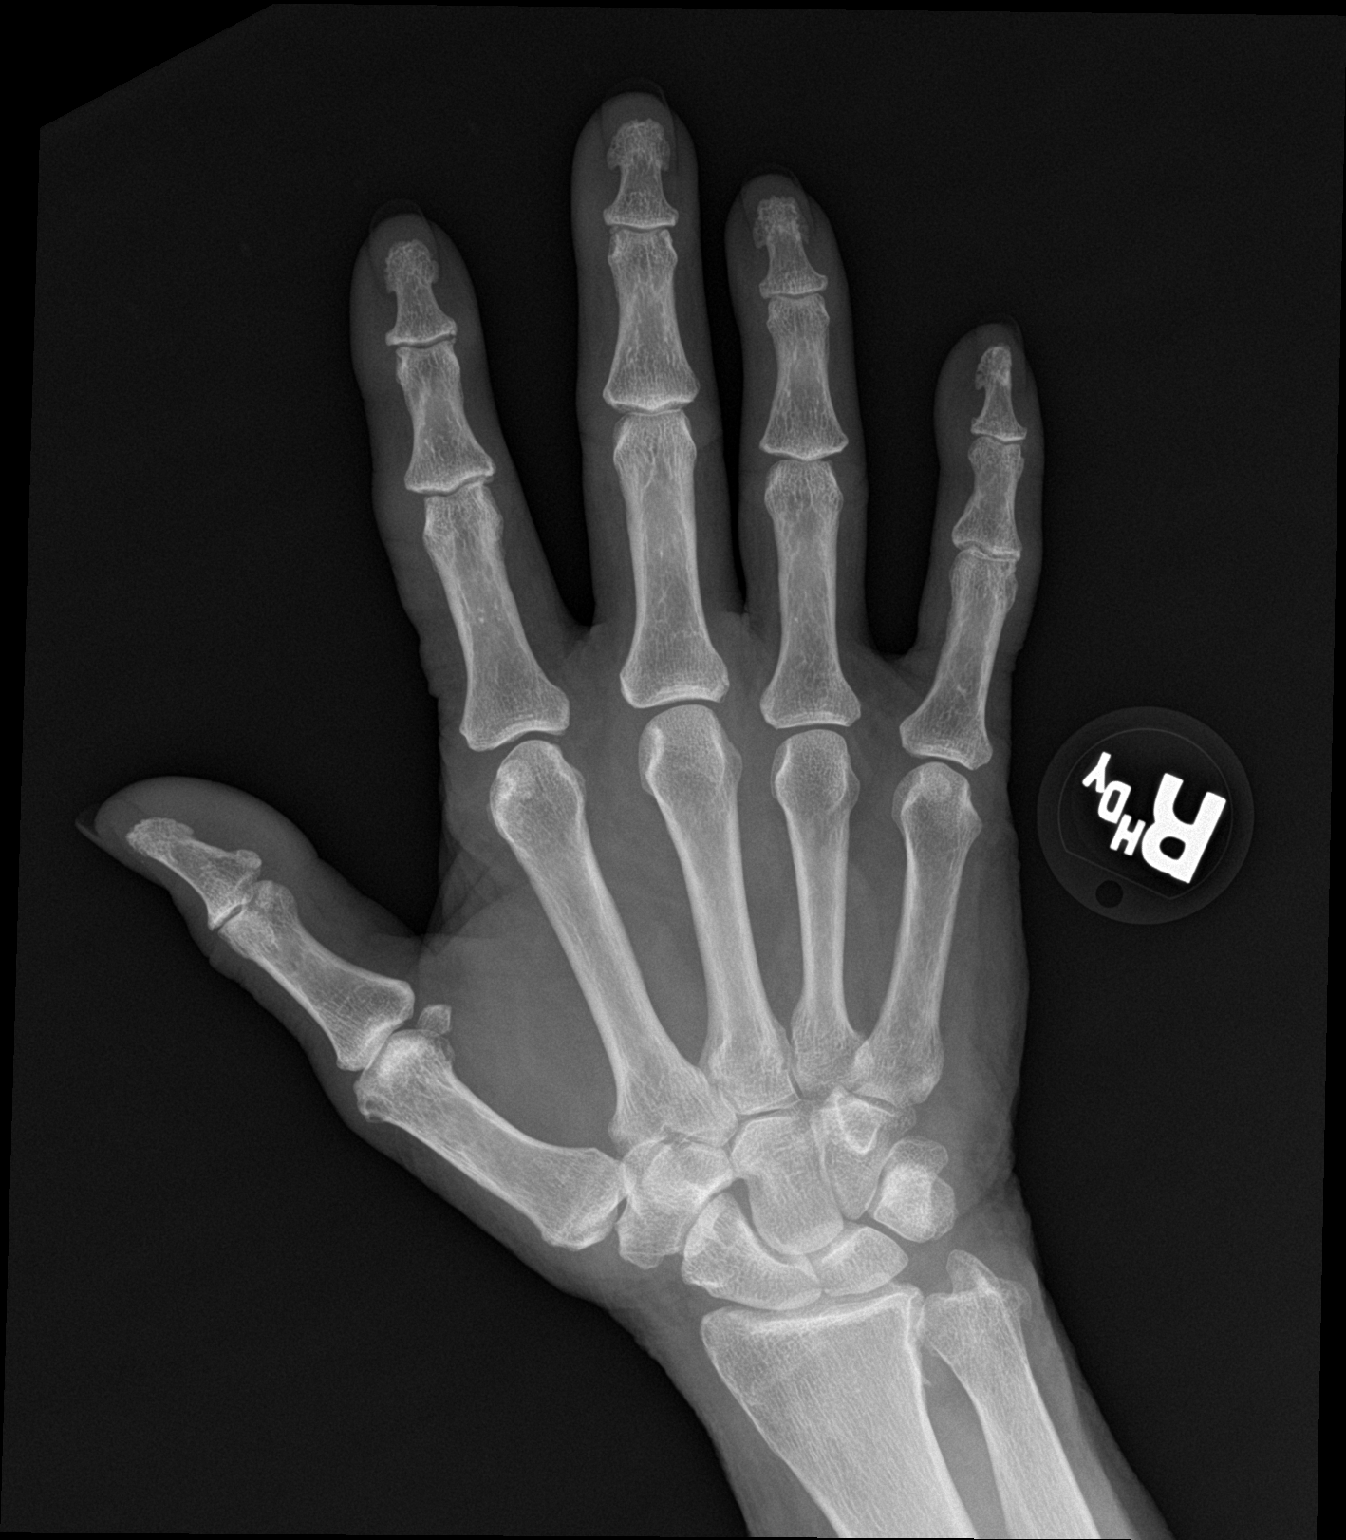

[hand lat]
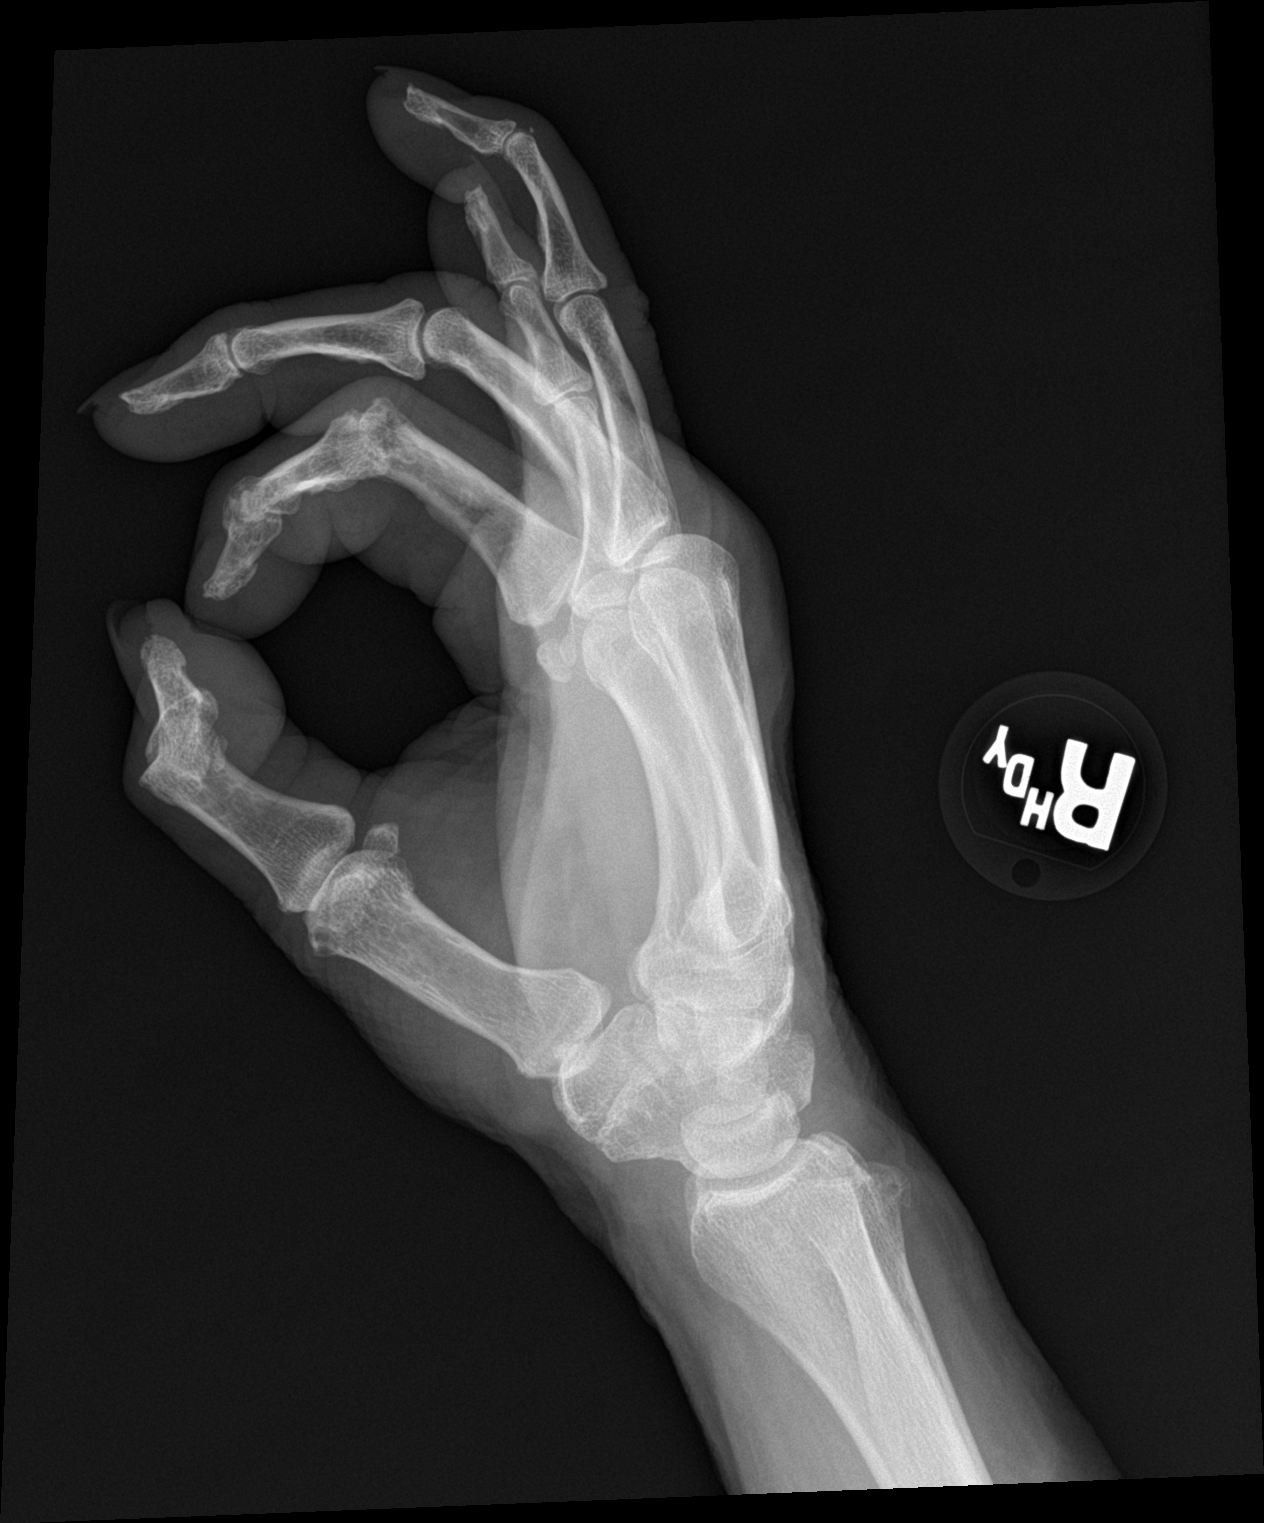

[2 of 2 positions shown; findings below may reference images not displayed]

FINDINGS: Soft tissue swelling about the dorsal aspect of the hand at the
level of the MCP joints without associated fracture or dislocation.
No radiopaque foreign body

Deformity involving the distal aspect of the ulna likely represents
sequela of remote injury. Joint spaces appear preserved. No definite
erosions. No evidence of chondrocalcinosis.
IMPRESSION: Soft tissue swelling about the dorsal aspect of the hand at the
level of the MCP joints without associated fracture, dislocation or
radiopaque foreign body

## 2019-10-02 MED ORDER — POTASSIUM CHLORIDE CRYS ER 20 MEQ PO TBCR
40.0000 meq | EXTENDED_RELEASE_TABLET | ORAL | Status: AC
  Administered 2019-10-02 (×2): 40 meq via ORAL
  Filled 2019-10-02 (×2): qty 2

## 2019-10-02 MED ORDER — VANCOMYCIN HCL 125 MG PO CAPS
125.0000 mg | ORAL_CAPSULE | Freq: Four times a day (QID) | ORAL | 0 refills | Status: AC
Start: 2019-10-02 — End: 2019-10-12

## 2019-10-02 NOTE — Discharge Instructions (Signed)
Clostridioides Difficile Infection Clostridioides difficile, or C. diff, infection is caused by germs (bacteria). It causes irritation and swelling of the colon (colitis). This infection can spread from person to person (is contagious). You may also get C. diff from food or water, or from touching surfaces that have the germs on them. What are the causes? Certain germs live in the colon and help to digest food. This infection starts when the balance of helpful germs in the colon changes, and the C. diff germs grow out of control. This is caused by taking antibiotics. What increases the risk? Your risk is higher if you:  Take certain antibiotics that kill many types of germs.  Take antibiotics for a long time.  Stay for a long time in a health care setting, such as: ? A hospital. ? A long-term care facility.  Are older than age 65. Your risk is somewhat higher if you:  Have had C. diff infection before or had contact with C. diff germs.  Have a weak body defense system (immune system).  Take a medicine for a long time that reduces stomach acid. This includes proton pump inhibitors.  Have serious health problems, such as: ? Colon cancer. ? Inflammatory bowel disease (IBD).  Have had a procedure or surgery on your gastrointestinal (GI) tract. Some people develop C. diff even though they are not clearly at risk. What are the signs or symptoms?  Watery poop (diarrhea).  Fever.  Tiredness (fatigue).  Loss of appetite.  Nausea.  Swelling, pain, cramping, or tenderness in your belly (abdomen). How is this treated?  Stopping the antibiotics that you were taking when the C. diff infection began. Do this only as told by your doctor.  Taking certain antibiotics to stop C. diff from growing.  Taking donor poop (stool) from a healthy person and placing it into the colon. This may be done if the infection keeps coming back.  Having surgery to remove the infected part of the  colon. This is rare. Follow these instructions at home: Medicines  Take over-the-counter and prescription medicines only as told by your doctor.  Take your antibiotic medicine as told by your doctor. Do not stop taking the antibiotic even if you start to feel better.  Do not treat watery poop with medicines unless your doctor tells you to. Eating and drinking   Follow instructions from your doctor about eating or drinking.  Eat bland foods in small amounts as you are able. These foods include: ? Bananas. ? Applesauce. ? Rice. ? Low-fat (lean) meats. ? Toast. ? Crackers.  Follow your doctor's instructions on how to get enough fluids into your body. You may need to: ? Drink clear fluids. This includes water, fruit juice you have added water to, or low-calorie sports drinks. ? Suck on ice chips. ? Take an ORS (oral rehydration solution).  Avoid milk, caffeine, and alcohol.  Drink enough fluid to keep your pee (urine) pale yellow. Activity  Rest as told by your doctor.  Return to your normal activities as told by your doctor. Ask your doctor what activities are safe for you. General instructions  Wash your hands often with soap and water. Do this for at least 20 seconds. Bathe using soap and water daily.  Be sure your home is clean before you leave the hospital or clinic to go home.  Continue daily cleaning for at least a week after going home.  Keep all follow-up visits as told by your doctor. This is important.   How is this prevented? Hand hygiene   Wash your hands well before you cook and after you use the bathroom. Use soap and water for at least 20 seconds. Make sure that people who live with you also wash their hands often.  If you are being treated at a hospital or clinic, make sure that: ? All doctors and nurses wash their hands with soap and water before touching you. ? All visitors wash their hands with soap and water before touching you. Contact  precautions  If you get watery poop while you are in the hospital or a long-term care facility, let your doctor know right away.  When you visit someone in the hospital or a long-term care facility, follow the rules for wearing a gown, gloves, or other protective equipment.  If possible, avoid contact with people who have watery poop.  If you are sick and live with other people, use a separate bathroom, if you can. Clean environment  Clean surfaces that are touched often every day. Use a product that has chlorine bleach in it. The bleach should be 10% solution. Be sure to: ? Read the instructions to find out if the product you are using will work on what you are cleaning. ? Clean toilets, bathtubs, sinks, door knobs, and work surfaces.  If you are in the hospital, make sure that the staff cleans the surfaces in your room each day. Tell someone right away if body fluids have splashed or spilled. Washing clothes and linens  Use laundry soap that has chlorine bleach in it to wash clothes and linens. Be sure to: ? Use powder soap instead of liquid. ? Run your washing machine on the hot setting with nothing but soap in it. Do this once a month. Contact a doctor if:  Your symptoms do not get better or they get worse.  Your symptoms go away and then come back.  You have a fever.  You have new symptoms. Get help right away if:  You have more pain or tenderness in your belly.  Your poop is mostly bloody.  Your poop looks dark black and tarry.  You cannot eat or drink without vomiting.  You have signs of not having enough fluids in your body. These include: ? Dark pee, very little pee, or no pee. ? Cracked lips or dry mouth. ? No tears when you cry. ? Sunken eyes. ? Feeling sleepy. ? Feeling weak or dizzy. Summary  C. diff infection may happen after taking antibiotic medicines.  Symptoms include watery poop, fever, tiredness, loss of appetite, nausea, and belly  problems.  Treatment starts with stopping the antibiotics you were using when the infection began. Certain antibiotics are then used to stop C. diff from growing.  This infection is sometimes treated by placing donor poop into the colon or doing surgery.  Washing hands and cleaning every day can help keep C. diff from spreading. This information is not intended to replace advice given to you by your health care provider. Make sure you discuss any questions you have with your health care provider. Document Revised: 05/26/2018 Document Reviewed: 05/26/2018 [employer] Patient Education  2020 [employer]  

## 2019-10-02 NOTE — Progress Notes (Signed)
Discharge instructions reviewed with patient-vancomycin sent to [facility] lock discontinued-waiting on husband to bring change of clothes and pick up patient.

## 2019-10-02 NOTE — Discharge Summary (Signed)
Physician Discharge Summary  Beth Stanley QMG:[id number] DOB: [birth year] DOA: 09/29/2019  PCP: Shannan Harper, MD  Admit date: 09/29/2019 Discharge date: 10/02/2019  Admitted From: Home Disposition: Home  Recommendations for Outpatient Follow-up:  1. Follow up with PCP/oncologist in 1-2 weeks 2. Please obtain BMP/CBC in one week 3. Please follow up with your PCP on the following pending results: Unresulted Labs (From admission, onward)          Start     Ordered   10/01/19 0500  CBC with Differential/Platelet  Daily,   STAT      09/30/19 1145           Home Health: Yes Equipment/Devices: None  Discharge Condition: Stable CODE STATUS: Full code Diet recommendation: Cardiac  Subjective: Seen and examined.  Feels much better.  She claims that she has had 1-2 bowel movements in last 24 hours.  She feels well, alert and oriented and wants to go home.  Brief/Interim Summary: Beth Stanley a 69 y.o.femalewith medical history significant forrecurrent follicularlymphoma receiving chemotherapy, last 09/22/18,complicated by colitis,HTN, , recently diagnosed with Covid on 09/27/2019 for which she received Regeneron l monoclonal antibody infusion on the day of arrival who was brought to the emergency room because of altered mental status. Patient had been doing well from a Covid standpoint with minimal shortness of breath and nocough however on the day of arrival she appeared to be lethargic and became progressively worse. According to the husband at the bedside who gives most of the history, patient has had similar symptoms before related to dehydration related to chemotherapy side effects.He said earlier in the day they stopped at a restaurant and she was awake and alert but then became lethargic thereafter and worsened after her monoclonal antibody infusion. She has had no focal weakness, numbness or tingling, visual disturbances and no falls. History is limited due to  altered mental status.  ED Course:On arrival, she was tachycardic at 107, tachypneic at 32 with low-grade temperature of 99.6, BP 150/72. Blood work notable for potassium of 2.7 but otherwise mostly unremarkable. Covid biomarkers not done. Chest x-ray was clear, head CT showed no acute intracranial abnormality but showed bilateral thalamic infarcts remote in nature. She did have a CTA chest that showed no PE or other acute abnormalities. Given finding of old thalamic infarcts, MRI was ordered from the ER to evaluate for stroke, currently pending at time of discharge.  Patient was then admitted to hospitalist service.  MRI brain was also negative for any acute stroke or any acute pathology.  Patient then developed high-grade fever of 102.9 and tachycardia as well as tachypnea. she complained of having diarrhea.  Her stool came back positive with C. difficile.  She was diagnosed with sepsis secondary to C. difficile colitis.  She was started on oral vancomycin.  After that, patient started improving and she has improved significantly over the last 2 days.  She states that she has had only 1-2 bowel movements in last 24 hours.  She has remained alert and oriented for past 48 hours.  She was evaluated by PT and they recommended home health PT for her.  Due to diarrhea, patient had recurrent hypokalemia which was replaced with potassium.  Patient is doing well today and is being discharged in stable condition with a prescription of 10 days of oral vancomycin for C. difficile colitis.  Patient's chemotherapy/Zydelig is being stopped at the time of discharge as this medication is well known to cause diarrhea and  colitis.  Patient's omeprazole, Lomotil as well as Bactrim DS is also being discontinued due to risk of worsening colitis.  Will defer to patient's PCP/her oncologist to consider resuming those medications at appropriate time.  Of note, patient was also seen by oncology here locally who did not have any  further recommendations.  They recommended follow-up with primary oncologist.  Discharge Diagnoses:  Principal Problem:   Acute metabolic encephalopathy Active Problems:   Lymphoma ([place])   Hypertension   COVID-19 virus infection   Hypokalemia   Encephalopathy acute   C. difficile colitis    Discharge Instructions  Discharge Instructions    Discharge patient   Complete by: As directed    Discharge disposition: 01-Home or Self Care   Discharge patient date: 10/02/2019     Allergies as of 10/02/2019      Reactions   Other    Tape Rash   Makes Whelps on Skin Makes Whelps on Skin      Medication List    STOP taking these medications   diphenoxylate-atropine 2.5-0.025 MG tablet Commonly known as: LOMOTIL   omeprazole 20 MG capsule Commonly known as: PRILOSEC   sulfamethoxazole-trimethoprim 800-160 MG tablet Commonly known as: BACTRIM DS   Zydelig 100 MG tablet Generic drug: idelalisib     TAKE these medications   albuterol 108 (90 Base) MCG/ACT inhaler Commonly known as: VENTOLIN HFA Inhale 1 puff into the lungs every 6 (six) hours as needed for wheezing.   blood glucose meter kit and supplies Kit Test blood sugar once daily. Dx code: E11.9   ferrous sulfate 325 (65 FE) MG tablet Take 1 tablet by mouth 3 (three) times a week. Monday Wednesday Friday   freestyle lancets TEST BLOOD GLUCOSE ONCE DAILY   FREESTYLE LITE test strip Generic drug: glucose blood TEST BLOOD SUGAR ONCE DAILY   ketorolac 0.5 % ophthalmic solution Commonly known as: ACULAR Place 1 drop into the left eye 4 (four) times daily.   multivitamin tablet Take 1 tablet by mouth daily.   ondansetron 4 MG tablet Commonly known as: ZOFRAN Take 4 mg by mouth every 8 (eight) hours as needed for nausea or vomiting.   potassium chloride SA 20 MEQ tablet Commonly known as: KLOR-CON Take 20 mEq by mouth daily.   prednisoLONE acetate 1 % ophthalmic suspension Commonly known as: PRED  FORTE Place 1 drop into the left eye daily.   predniSONE 10 MG tablet Commonly known as: DELTASONE Take 5 mg by mouth daily with breakfast.   ramipril 2.5 MG capsule Commonly known as: ALTACE Take 1 capsule (2.5 mg total) by mouth daily.   sertraline 100 MG tablet Commonly known as: ZOLOFT TAKE ONE (1) TABLET EACH DAY What changed:   how much to take  how to take this  when to take this  additional instructions   valACYclovir 500 MG tablet Commonly known as: VALTREX Take 500 mg by mouth daily as needed.   vancomycin 125 MG capsule Commonly known as: VANCOCIN Take 1 capsule (125 mg total) by mouth 4 (four) times daily for 10 days.       Follow-up Information    Seegars, Olean Ree, MD Follow up in 1 week(s).   Specialty: Internal Medicine Contact information: [place] [address] [phone]              Allergies  Allergen Reactions  . Other   . Tape Rash    Makes Whelps on Skin Makes Whelps on Skin  Consultations: Oncology.   Procedures/Studies: DG Chest 1 View  Result Date: 09/29/2019 CLINICAL DATA:  Lethargy, altered level of consciousness, COVID-19 positive 09/23/2019, pain after infusion EXAM: CHEST  1 VIEW COMPARISON:  05/05/2017 FINDINGS: Frontal view of the chest demonstrates stable right chest wall port. Cardiac silhouette is unremarkable. There is background parenchymal lung scarring without focal consolidation, effusion, or pneumothorax. No acute bony abnormalities. IMPRESSION: 1. No acute intrathoracic process. Electronically Signed   By: Randa Ngo M.D.   On: 09/29/2019 20:12   CT HEAD WO CONTRAST  Result Date: 09/29/2019 CLINICAL DATA:  Altered mental status EXAM: CT HEAD WITHOUT CONTRAST TECHNIQUE: Contiguous axial images were obtained from the base of the skull through the vertex without intravenous contrast. COMPARISON:  04/07/2016 FINDINGS: Brain: Normal anatomic configuration. Parenchymal volume loss is  commensurate with the patient's age. Extensive periventricular white matter changes are present likely reflecting the sequela of small vessel ischemia. These appears slightly progressive when compared to prior examination. Bilateral thalamic infarcts are identified, new from prior examination, but remote in nature. No abnormal intra or extra-axial mass lesion or fluid collection. No abnormal mass effect or midline shift. No evidence of acute intracranial hemorrhage or infarct. Ventricular size is normal. Cerebellum unremarkable. Vascular: No asymmetric hyperdense vasculature at the skull base. Skull: Intact Sinuses/Orbits: There is moderate mucosal thickening within several ethmoid air cells bilaterally. Small layering fluid noted within the right sphenoid sinus. Remaining paranasal sinuses are clear. Orbits are unremarkable. Other: Mastoid air cells and middle ear cavities are clear. IMPRESSION: 1. No acute intracranial abnormality. 2. Extensive periventricular white matter changes likely reflecting the sequela of small vessel ischemia. These appears slightly progressive when compared to prior examination. 3. Bilateral thalamic infarcts, new from prior examination, but remote in nature. 4. Paranasal sinus disease as above. Electronically Signed   By: Fidela Salisbury MD   On: 09/29/2019 21:32   CT Angio Chest PE W and/or Wo Contrast  Result Date: 09/29/2019 CLINICAL DATA:  Altered mental status EXAM: CT ANGIOGRAPHY CHEST WITH CONTRAST TECHNIQUE: Multidetector CT imaging of the chest was performed using the standard protocol during bolus administration of intravenous contrast. Multiplanar CT image reconstructions and MIPs were obtained to evaluate the vascular anatomy. CONTRAST:  51m OMNIPAQUE IOHEXOL 350 MG/ML SOLN COMPARISON:  None. FINDINGS: Cardiovascular: Satisfactory opacification of the pulmonary arteries to the segmental level. No evidence of pulmonary embolism. Normal heart size. No pericardial effusion.  The thoracic aorta is of normal caliber. Mild atherosclerotic calcification within the descending thoracic aorta. Right internal jugular dual lumen chest port is seen with its tip within the terminal superior vena cava. Mediastinum/Nodes: No pathologic thoracic adenopathy. Thyroid unremarkable. Esophagus unremarkable. Lungs/Pleura: Moderate centrilobular emphysema. Biapical pleuroparenchymal scarring appears stable. No superimposed focal pulmonary nodules or infiltrates. No pneumothorax or pleural effusion. Central airways are widely patent. Upper Abdomen: No acute abnormality. Musculoskeletal: No chest wall abnormality. No acute or significant osseous findings. Review of the MIP images confirms the above findings. IMPRESSION: 1. No evidence of acute pulmonary embolism or other acute intrathoracic findings. Aortic Atherosclerosis (ICD10-I70.0) and Emphysema (ICD10-J43.9). Electronically Signed   By: AFidela SalisburyMD   On: 09/29/2019 21:37   MR BRAIN WO CONTRAST  Result Date: 09/30/2019 CLINICAL DATA:  Encephalopathy. History of non-Hodgkin's lymphoma undergoing chemotherapy. EXAM: MRI HEAD WITHOUT CONTRAST TECHNIQUE: Multiplanar, multiecho pulse sequences of the brain and surrounding structures were obtained without intravenous contrast. COMPARISON:  Brain MRI 04/30/2016 FINDINGS: Brain: No acute infarct, acute hemorrhage or extra-axial collection. Diffuse confluent  hyperintense T2-weighted signal within the periventricular, deep and juxtacortical white matter has markedly worsened. There is generalized atrophy without lobar predilection. No chronic microhemorrhage. Normal midline structures. Vascular: Normal flow voids. Skull and upper cervical spine: Normal marrow signal. Sinuses/Orbits: Fluid in the maxillary and sphenoid sinuses. Other: None IMPRESSION: 1. No acute intracranial abnormality. 2. Severely worsened white matter disease, which could be due to chronic microvascular ischemia or an effect of  chemotherapy. 3. Fluid in the maxillary and sphenoid sinuses. Correlate for acute sinusitis. Electronically Signed   By: Ulyses Jarred M.D.   On: 09/30/2019 02:47   DG Hand 2 View Right  Result Date: 10/02/2019 CLINICAL DATA:  Right hand pain near the distal aspect of the third metacarpal. EXAM: RIGHT HAND - 2 VIEW COMPARISON:  None. FINDINGS: Soft tissue swelling about the dorsal aspect of the hand at the level of the MCP joints without associated fracture or dislocation. No radiopaque foreign body Deformity involving the distal aspect of the ulna likely represents sequela of remote injury. Joint spaces appear preserved. No definite erosions. No evidence of chondrocalcinosis. IMPRESSION: Soft tissue swelling about the dorsal aspect of the hand at the level of the MCP joints without associated fracture, dislocation or radiopaque foreign body Electronically Signed   By: Sandi Mariscal M.D.   On: 10/02/2019 10:37   US Carotid Bilateral (at [facility] and [facility] only)  Result Date: 09/30/2019 CLINICAL DATA:  TIA. History of hypertension, hyperlipidemia, diabetes and smoking. EXAM: BILATERAL CAROTID DUPLEX ULTRASOUND TECHNIQUE: Pearline Cables scale imaging, color Doppler and duplex ultrasound were performed of bilateral carotid and vertebral arteries in the neck. COMPARISON:  None FINDINGS: Criteria: Quantification of carotid stenosis is based on velocity parameters that correlate the residual internal carotid diameter with NASCET-based stenosis levels, using the diameter of the distal internal carotid lumen as the denominator for stenosis measurement. The following velocity measurements were obtained: RIGHT ICA: 96/30 cm/sec CCA: 70/62 cm/sec SYSTOLIC ICA/CCA RATIO:  1.3 ECA: 56 cm/sec LEFT ICA: 75/27 cm/sec CCA: 37/62 cm/sec SYSTOLIC ICA/CCA RATIO:  1.0 ECA: 74 cm/sec RIGHT CAROTID ARTERY: There is a minimal amount of eccentric echogenic plaque within the right carotid bulb (image 19), extending to involve the origin and proximal  aspects the right internal carotid artery (image 25), not resulting in elevated peak systolic velocities within the interrogated course of the right internal carotid artery to suggest a hemodynamically significant stenosis. RIGHT VERTEBRAL ARTERY:  Antegrade flow LEFT CAROTID ARTERY: There is a minimal amount of eccentric echogenic plaque within the left carotid bulb (images 38, 39 and 52), extending to involve the origin and proximal aspects of the left internal carotid artery (image 58), not resulting in elevated peak systolic velocities within the interrogated course of the left internal carotid artery to suggest a hemodynamically significant stenosis. LEFT VERTEBRAL ARTERY:  Antegrade flow IMPRESSION: Minimal amount of bilateral atherosclerotic plaque, not resulting in a hemodynamically significant stenosis within either internal carotid artery. Electronically Signed   By: Sandi Mariscal M.D.   On: 09/30/2019 07:26   US Venous Img Lower Unilateral Left  Result Date: 09/12/2019 CLINICAL DATA:  69 year old female with LEFT LOWER extremity swelling and pain for 4 days. EXAM: LEFT LOWER EXTREMITY VENOUS DOPPLER ULTRASOUND TECHNIQUE: Gray-scale sonography with compression, as well as color and duplex ultrasound, were performed to evaluate the deep venous system(s) from the level of the common femoral vein through the popliteal and proximal calf veins. COMPARISON:  None. FINDINGS: VENOUS Normal compressibility of the common femoral, superficial femoral, and  popliteal veins, as well as the visualized calf veins. Visualized portions of profunda femoral vein and great saphenous vein unremarkable. No filling defects to suggest DVT on grayscale or color Doppler imaging. Doppler waveforms show normal direction of venous flow, normal respiratory plasticity and response to augmentation. Limited views of the contralateral common femoral vein are unremarkable. OTHER Subcutaneous edema is noted. Limitations: none IMPRESSION: No  evidence of DVT. Subcutaneous edema. Electronically Signed   By: Margarette Canada M.D.   On: 09/12/2019 18:03   ECHOCARDIOGRAM COMPLETE  Result Date: 09/30/2019    ECHOCARDIOGRAM REPORT   Patient Name:   Beth Stanley Date of Exam: 09/30/2019 Medical Rec #:  [id number]        Height:       63.0 in Accession #:    [id number]       Weight:       138.0 lb Date of Birth:  [birth year]        BSA:          1.652 m Patient Age:    25 years         BP:           92/54 mmHg Patient Gender: F                HR:           75 bpm. Exam Location:  [facility] Procedure: 2D Echo, Color Doppler and Cardiac Doppler Indications:     G45.9 TIA  History:         Patient has no prior history of Echocardiogram examinations.                  Risk Factors:Hypertension. Pt tested positive for COVID-19 on                  10/03/19.  Sonographer:     Charmayne Sheer RDCS (AE) Referring Phys:  [id number] Athena Masse Diagnosing Phys: Ida Rogue MD  Sonographer Comments: Suboptimal parasternal window. IMPRESSIONS  1. Left ventricular ejection fraction, by estimation, is 55 to 60%. The left ventricle has normal function. The left ventricle has no regional wall motion abnormalities. Left ventricular diastolic parameters are consistent with Grade I diastolic dysfunction (impaired relaxation).  2. Right ventricular systolic function is normal. The right ventricular size is normal. FINDINGS  Left Ventricle: Left ventricular ejection fraction, by estimation, is 55 to 60%. The left ventricle has normal function. The left ventricle has no regional wall motion abnormalities. The left ventricular internal cavity size was normal in size. There is  no left ventricular hypertrophy. Left ventricular diastolic parameters are consistent with Grade I diastolic dysfunction (impaired relaxation). Right Ventricle: The right ventricular size is normal. No increase in right ventricular wall thickness. Right ventricular systolic function is normal. Left Atrium: Left atrial  size was normal in size. Right Atrium: Right atrial size was normal in size. Pericardium: There is no evidence of pericardial effusion. Mitral Valve: The mitral valve is normal in structure. No evidence of mitral valve regurgitation. No evidence of mitral valve stenosis. MV peak gradient, 9.4 mmHg. The mean mitral valve gradient is 3.0 mmHg. Tricuspid Valve: The tricuspid valve is normal in structure. Tricuspid valve regurgitation is not demonstrated. No evidence of tricuspid stenosis. Aortic Valve: The aortic valve is normal in structure. Aortic valve regurgitation is not visualized. No aortic stenosis is present. Aortic valve mean gradient measures 4.0 mmHg. Aortic valve peak gradient measures 7.8 mmHg. Aortic valve area, by VTI  measures 2.01 cm. Pulmonic Valve: The pulmonic valve was normal in structure. Pulmonic valve regurgitation is not visualized. No evidence of pulmonic stenosis. Aorta: The aortic root is normal in size and structure. Venous: The inferior vena cava is normal in size with greater than 50% respiratory variability, suggesting right atrial pressure of 3 mmHg. IAS/Shunts: No atrial level shunt detected by color flow Doppler.  LEFT VENTRICLE PLAX 2D LVIDd:         4.37 cm  Diastology LVIDs:         3.03 cm  LV e' medial:    5.87 cm/s LV PW:         0.88 cm  LV E/e' medial:  17.7 LV IVS:        0.73 cm  LV e' lateral:   6.85 cm/s LVOT diam:     2.00 cm  LV E/e' lateral: 15.2 LV SV:         53 LV SV Index:   32 LVOT Area:     3.14 cm  LEFT ATRIUM             Index LA diam:        3.00 cm 1.82 cm/m LA Vol (A2C):   17.0 ml 10.29 ml/m LA Vol (A4C):   29.8 ml 18.04 ml/m LA Biplane Vol: 24.3 ml 14.71 ml/m  AORTIC VALVE AV Area (Vmax):    2.22 cm AV Area (Vmean):   2.29 cm AV Area (VTI):     2.01 cm AV Vmax:           140.00 cm/s AV Vmean:          95.800 cm/s AV VTI:            0.263 m AV Peak Grad:      7.8 mmHg AV Mean Grad:      4.0 mmHg LVOT Vmax:         99.00 cm/s LVOT Vmean:        69.700  cm/s LVOT VTI:          0.168 m LVOT/AV VTI ratio: 0.64  AORTA Ao Root diam: 2.40 cm MITRAL VALVE MV Area (PHT): 3.60 cm     SHUNTS MV Peak grad:  9.4 mmHg     Systemic VTI:  0.17 m MV Mean grad:  3.0 mmHg     Systemic Diam: 2.00 cm MV Vmax:       1.53 m/s MV Vmean:      82.5 cm/s MV Decel Time: 211 msec MV E velocity: 104.00 cm/s MV A velocity: 126.00 cm/s MV E/A ratio:  0.83 Ida Rogue MD Electronically signed by Ida Rogue MD Signature Date/Time: 09/30/2019/1:29:51 PM    Final    CT EXTREMITY LOWER LEFT W CONTRAST  Result Date: 09/13/2019 CLINICAL DATA:  Soft tissue bruising EXAM: CT OF THE LOWER LEFT EXTREMITY WITH CONTRAST TECHNIQUE: Multidetector CT imaging of the lower left extremity was performed according to the standard protocol following intravenous contrast administration. COMPARISON:  None similar and available. CONTRAST:  146m OMNIPAQUE IOHEXOL 300 MG/ML  SOLN FINDINGS: Bones/Joint/Cartilage Negative for fracture, subluxation, or bone lesion. Generalized osteopenic appearance. Negative for joint effusions. Ligaments Suboptimally assessed by CT. Muscles and Tendons Mild expansion and patchy low-density appearance of the upper hamstring with more discrete low-density collection type appearance inferiorly, latter portion measuring 7 x 1.5 cm. [facility] but there is mention of hamstring tear and  tendinosis on pelvis MRI 06/22/2019. Soft tissues Subcutaneous reticulation throughout the calf and foot without discrete collection or abnormal enhancement. IMPRESSION: 1. Muscular low-density and mild expansion in the upper to mid left hamstring that correlates with outside pelvis MRI report of tear and tendinosis at the left greater than right hamstring. More inferiorly there is a discrete intramuscular collection likely reflecting organized hematoma. History of follicular lymphoma with muscular involvement, no report of hypermetabolism  in this area on recent PET-CT. 2. Generalized subcutaneous swelling. Electronically Signed   By: Monte Fantasia M.D.   On: 09/13/2019 05:56      Discharge Exam: Vitals:   10/02/19 0452 10/02/19 0745  BP: 124/62 (!) 122/50  Pulse: 72 79  Resp: 18 16  Temp: 97.7 F (36.5 C) 98 F (36.7 C)  SpO2: 97% 96%   Vitals:   10/01/19 2007 10/01/19 2302 10/02/19 0452 10/02/19 0745  BP: 136/61 136/69 124/62 (!) 122/50  Pulse: 70 66 72 79  Resp: _0 Temp: 97.7 F (36.5 C) 97.7 F (36.5 C) 97.7 F (36.5 C) 98 F (36.7 C)  TempSrc:  Oral Oral Oral  SpO2: 98% 100% 97% 96%  Weight:      Height:        General: Pt is alert, awake, not in acute distress Cardiovascular: RRR, S1/S2 +, no rubs, no gallops Respiratory: CTA bilaterally, no wheezing, no rhonchi Abdominal: Soft, NT, ND, bowel sounds + Extremities: no edema, no cyanosis    The results of significant diagnostics from this hospitalization (including imaging, microbiology, ancillary and laboratory) are listed below for reference.     Microbiology: Recent Results (from the past 240 hour(s))  CULTURE, BLOOD (ROUTINE X 2) w Reflex to ID Panel     Status: None (Preliminary result)   Collection Time: 09/30/19 12:55 PM   Specimen: BLOOD  Result Value Ref Range Status   Specimen Description BLOOD BLOOD RIGHT FOREARM  Final   Special Requests   Final    BOTTLES DRAWN AEROBIC AND ANAEROBIC Blood Culture results may not be optimal due to an inadequate volume of blood received in culture bottles   Culture   Final    NO GROWTH 2 DAYS Performed at [facility], [address], [place], [place] [address]    Report Status PENDING  Incomplete  CULTURE, BLOOD (ROUTINE X 2) w Reflex to ID Panel     Status: None (Preliminary result)   Collection Time: 09/30/19  1:07 PM   Specimen: BLOOD  Result Value Ref Range Status   Specimen Description BLOOD BLOOD LEFT HAND  Final   Special Requests   Final    BOTTLES DRAWN  AEROBIC AND ANAEROBIC Blood Culture adequate volume   Culture   Final    NO GROWTH 2 DAYS Performed at [facility], [address], [place], [place] [address]    Report Status PENDING  Incomplete  C Difficile Quick Screen w PCR reflex     Status: Abnormal   Collection Time: 09/30/19  4:26 PM   Specimen: STOOL  Result Value Ref Range Status   C Diff antigen POSITIVE (A) NEGATIVE Final   C Diff toxin NEGATIVE NEGATIVE Final   C Diff interpretation Results are indeterminate. See PCR results.  Final    Comment: Performed at [facility], [address] [place], [place] [address]  C. Diff by PCR, Reflexed     Status: Abnormal   Collection Time: 09/30/19  4:26 PM  Result  Value Ref Range Status   Toxigenic C. Difficile by PCR POSITIVE (A) NEGATIVE Final    Comment: Positive for toxigenic C. difficile with little to no toxin production. Only treat if clinical presentation suggests symptomatic illness. Performed at [facility], [address] [place], [place] [address]      Labs: BNP (last 3 results) No results for input(s): BNP in the last 8760 hours. Basic Metabolic Panel: Recent Labs  Lab 09/29/19 1928 09/30/19 0905 10/01/19 0938 10/02/19 0457  NA 143 140 141 142  K 2.7* 2.8* 3.9 3.0*  CL 109 107 108 109  CO2 _0 GLUCOSE 109* 101* 121* 181*  BUN 8 7* 10 13  CREATININE 0.65 0.54 0.68 0.73  CALCIUM 8.3* 7.7* 8.7* 8.6*  MG 1.8 1.8 2.1  --    Liver Function Tests: Recent Labs  Lab 09/30/19 0905 10/01/19 0938 10/02/19 0457  AST _1 ALT _2 ALKPHOS 45 50 58  BILITOT 0.5 0.6 0.4  PROT 5.3* 6.2* 5.7*  ALBUMIN 3.0* 3.5 3.4*   No results for input(s): LIPASE, AMYLASE in the last 168 hours. Recent Labs  Lab 09/30/19 0905  AMMONIA 24   CBC: Recent Labs  Lab 09/29/19 1928 09/30/19 0905 10/01/19 0938 10/02/19 0457  WBC 6.9 5.4 4.9 5.6  NEUTROABS  --  1.7 2.7 2.3  HGB 10.9* 9.5* 11.0* 10.3*  HCT 33.0* 29.1*  34.0* 30.3*  MCV 92.7 93.0 93.2 88.9  PLT 139* 120* 134* 139*   Cardiac Enzymes: No results for input(s): CKTOTAL, CKMB, CKMBINDEX, TROPONINI in the last 168 hours. BNP: Invalid input(s): POCBNP CBG: No results for input(s): GLUCAP in the last 168 hours. D-Dimer No results for input(s): DDIMER in the last 72 hours. Hgb A1c Recent Labs    09/30/19 0558  HGBA1C 6.6*   Lipid Profile Recent Labs    09/30/19 0558  CHOL 129  HDL 33*  LDLCALC 67  TRIG 145  CHOLHDL 3.9   Thyroid function studies No results for input(s): TSH, T4TOTAL, T3FREE, THYROIDAB in the last 72 hours.  Invalid input(s): FREET3 Anemia work up No results for input(s): VITAMINB12, FOLATE, FERRITIN, TIBC, IRON, RETICCTPCT in the last 72 hours. Urinalysis    Component Value Date/Time   COLORURINE STRAW (A) 09/29/2019 2257   APPEARANCEUR CLEAR (A) 09/29/2019 2257   APPEARANCEUR Clear 06/05/2016 1003   LABSPEC 1.035 (H) 09/29/2019 2257   PHURINE 5.0 09/29/2019 2257   GLUCOSEU NEGATIVE 09/29/2019 2257   HGBUR SMALL (A) 09/29/2019 2257   BILIRUBINUR NEGATIVE 09/29/2019 2257   BILIRUBINUR negative 02/16/2017 1628   BILIRUBINUR Negative 06/05/2016 1003   KETONESUR NEGATIVE 09/29/2019 2257   PROTEINUR NEGATIVE 09/29/2019 2257   UROBILINOGEN 0.2 02/16/2017 1628   UROBILINOGEN 0.2 01/24/2010 1052   NITRITE NEGATIVE 09/29/2019 2257   LEUKOCYTESUR NEGATIVE 09/29/2019 2257   Sepsis Labs Invalid input(s): PROCALCITONIN,  WBC,  LACTICIDVEN Microbiology Recent Results (from the past 240 hour(s))  CULTURE, BLOOD (ROUTINE X 2) w Reflex to ID Panel     Status: None (Preliminary result)   Collection Time: 09/30/19 12:55 PM   Specimen: BLOOD  Result Value Ref Range Status   Specimen Description BLOOD BLOOD RIGHT FOREARM  Final   Special Requests   Final    BOTTLES DRAWN AEROBIC AND ANAEROBIC Blood Culture results may not be optimal due to an inadequate volume of blood received in culture bottles   Culture   Final     NO GROWTH  2 DAYS Performed at [facility], [address] [place], [place] [address]    Report Status PENDING  Incomplete  CULTURE, BLOOD (ROUTINE X 2) w Reflex to ID Panel     Status: None (Preliminary result)   Collection Time: 09/30/19  1:07 PM   Specimen: BLOOD  Result Value Ref Range Status   Specimen Description BLOOD BLOOD LEFT HAND  Final   Special Requests   Final    BOTTLES DRAWN AEROBIC AND ANAEROBIC Blood Culture adequate volume   Culture   Final    NO GROWTH 2 DAYS Performed at [facility], [address] [place], [place] [address]    Report Status PENDING  Incomplete  C Difficile Quick Screen w PCR reflex     Status: Abnormal   Collection Time: 09/30/19  4:26 PM   Specimen: STOOL  Result Value Ref Range Status   C Diff antigen POSITIVE (A) NEGATIVE Final   C Diff toxin NEGATIVE NEGATIVE Final   C Diff interpretation Results are indeterminate. See PCR results.  Final    Comment: Performed at [facility], [address] [place], [place] [address]  C. Diff by PCR, Reflexed     Status: Abnormal   Collection Time: 09/30/19  4:26 PM  Result Value Ref Range Status   Toxigenic C. Difficile by PCR POSITIVE (A) NEGATIVE Final    Comment: Positive for toxigenic C. difficile with little to no toxin production. Only treat if clinical presentation suggests symptomatic illness. Performed at [facility], [address], [place], [place] [address]      Time coordinating discharge: Over 30 minutes  SIGNED:   Darliss Cheney, MD  [facility] 10/02/2019, 10:45 AM  If 7PM-7AM, please contact night-coverage [email or web address]

## 2019-10-02 NOTE — Progress Notes (Signed)
Patient ha uneventful night. She received all ordered medications, including 650mg  Tylenol for mild headache pain. RN educated patient on use and rationale for IS. Pt verbalized understanding and demonstrated use. Also discussed C-difficile and importance of hand hygiene to prevent spreading. Vital signs remained stable. She rested well. Voided at least twice during shift. Breathing is unlabored at rest. Will report to oncoming nurse.

## 2019-10-02 NOTE — TOC Initial Note (Signed)
Transition of Care [facility]) - Initial/Assessment Note    Patient Details  Name: Beth Stanley MRN: [id number] Date of Birth: [birth year]  [facility]) CM/SW Contact:    Elliot Gurney [place], [place] Phone Number:[phone] 10/02/2019, 10:13 AM  Clinical Narrative:                 Patient is a 69 year old female admitted with altered mental status. Patient recently diagnosed with COVID-19. Patient has discharge orders for home today with HH-PT. Patient's spouse to provide transport home. Phone call to patient in her room to discuss discharge needs. Patient agrees with plan for [facility] PT and does not have a preference in regards to agency used. [facility] has accepted patient and will follow up post discharge.  Expected Discharge Plan: Home w Home Health Services Barriers to Discharge: No Barriers Identified   Patient Goals and CMS Choice Patient states their goals for this hospitalization and ongoing recovery are:: "I am ready to go home"      Expected Discharge Plan and Services Expected Discharge Plan: [place] In-house Referral: Clinical Social Work   Post Acute Care Choice: Home Health Living arrangements for the past 2 months: Single Family Home Expected Discharge Date: 10/02/19                         HH Arranged: PT HH Agency: [facility] Date HH Agency Contacted: 10/02/19 Time HH Agency Contacted: 32 Representative spoke with at [place]: [place] Arrangements/Services Living arrangements for the past 2 months: [place] with:: Spouse Patient language and need for interpreter reviewed:: No Do you feel safe going back to the place where you live?: Yes      Need for Family Participation in Patient Care: Yes (Comment) Care giver support system in place?: Yes (comment)   Criminal Activity/Legal Involvement Pertinent to Current Situation/Hospitalization: No - Comment as needed  Activities of Daily  Living Home Assistive Devices/Equipment: None ADL Screening (condition at time of admission) Patient's cognitive ability adequate to safely complete daily activities?: Yes Is the patient deaf or have difficulty hearing?: No Does the patient have difficulty seeing, even when wearing glasses/contacts?: No Does the patient have difficulty concentrating, remembering, or making decisions?: Yes Patient able to express need for assistance with ADLs?: No Does the patient have difficulty dressing or bathing?: No Independently performs ADLs?: Yes (appropriate for developmental age) Does the patient have difficulty walking or climbing stairs?: No Weakness of Legs: None Weakness of Arms/Hands: None  Permission Sought/Granted   Permission granted to share information with : Yes, Verbal Permission Granted  Share Information with NAME: [place]  Permission granted to share info w AGENCY: [place] granted to share info w Relationship: Darika Ildefonso  Permission granted to share info w Contact Information: [phone]  Emotional Assessment   Attitude/Demeanor/Rapport: Engaged   Orientation: : Oriented to Self, Oriented to Place, Oriented to  Time, Oriented to Situation Alcohol / Substance Use: Not Applicable Psych Involvement: No (comment)  Admission diagnosis:  TIA (transient ischemic attack) [G45.9] Encephalopathy acute [G93.40] SIRS (systemic inflammatory response syndrome) ([place]) [W29.56] Acute metabolic encephalopathy [O13.08] AMS (altered mental status) [R41.82] COVID-19 virus infection [U07.1] Patient Active Problem List   Diagnosis Date Noted  . C. difficile colitis 10/01/2019  . COVID-19 virus infection 09/29/2019  . Acute metabolic encephalopathy 65/78/4696  . Hypokalemia 09/29/2019  . Encephalopathy acute 09/29/2019  . Smoker 04/25/2014  .  Lymphoma ([place]) 02/03/2012  . Hyperlipidemia 02/03/2012  . Hypertension 02/03/2012  . Reflux 02/03/2012  . Depression 02/03/2012    PCP:  Shannan Harper, MD Pharmacy:   [facility] [address], [place] [address] Phone: [phone] Fax: [place], [place] [address] [address] Phone: [phone] Fax: [place], [place] [address] Phone: [phone] Fax: [phone]  [place], [place] [address] Phone: [phone] Fax: [phone]     Social Determinants of Health (SDOH) Interventions    Readmission Risk Interventions No flowsheet data found.

## 2019-10-05 LAB — CULTURE, BLOOD (ROUTINE X 2)
Culture: NO GROWTH
Culture: NO GROWTH
Special Requests: ADEQUATE

## 2019-10-07 ENCOUNTER — Other Ambulatory Visit
Admission: RE | Admit: 2019-10-07 | Discharge: 2019-10-07 | Disposition: A | Discharge status: Home / Self Care | Payer: PPO | Source: Ambulatory Visit | Attending: Family | Admitting: Family

## 2019-10-07 DIAGNOSIS — E860 Dehydration: Secondary | ICD-10-CM | POA: Diagnosis not present

## 2019-10-07 DIAGNOSIS — E876 Hypokalemia: Secondary | ICD-10-CM | POA: Diagnosis not present

## 2019-10-07 DIAGNOSIS — A0471 Enterocolitis due to Clostridium difficile, recurrent: Secondary | ICD-10-CM | POA: Diagnosis not present

## 2019-10-07 LAB — CBC
HCT: 31.3 % — ABNORMAL LOW (ref 36.0–46.0)
Hemoglobin: 10.6 g/dL — ABNORMAL LOW (ref 12.0–15.0)
MCH: 30.7 pg (ref 26.0–34.0)
MCHC: 33.9 g/dL (ref 30.0–36.0)
MCV: 90.7 fL (ref 80.0–100.0)
Platelets: 191 10*3/uL (ref 150–400)
RBC: 3.45 MIL/uL — ABNORMAL LOW (ref 3.87–5.11)
RDW: 15.5 % (ref 11.5–15.5)
WBC: 8.4 10*3/uL (ref 4.0–10.5)
nRBC: 0 % (ref 0.0–0.2)

## 2019-10-07 LAB — BASIC METABOLIC PANEL
Anion gap: 13 (ref 5–15)
BUN: 12 mg/dL (ref 8–23)
CO2: 21 mmol/L — ABNORMAL LOW (ref 22–32)
Calcium: 8.9 mg/dL (ref 8.9–10.3)
Chloride: 106 mmol/L (ref 98–111)
Creatinine, Ser: 0.85 mg/dL (ref 0.44–1.00)
GFR calc Af Amer: >60 mL/min (ref >60)
GFR calc non Af Amer: >60 mL/min (ref >60)
Glucose, Bld: 124 mg/dL — ABNORMAL HIGH (ref 70–99)
Potassium: 2.7 mmol/L — CL (ref 3.5–5.1)
Sodium: 140 mmol/L (ref 135–145)

## 2019-10-07 LAB — MAGNESIUM: Magnesium: 1.7 mg/dL (ref 1.7–2.4)

## 2019-10-09 DIAGNOSIS — J45901 Unspecified asthma with (acute) exacerbation: Secondary | ICD-10-CM | POA: Diagnosis not present

## 2019-10-11 DIAGNOSIS — R000 Tachycardia, unspecified: Secondary | ICD-10-CM | POA: Diagnosis not present

## 2019-10-11 DIAGNOSIS — N390 Urinary tract infection, site not specified: Secondary | ICD-10-CM | POA: Diagnosis not present

## 2019-10-11 DIAGNOSIS — C8588 Other specified types of non-Hodgkin lymphoma, lymph nodes of multiple sites: Secondary | ICD-10-CM | POA: Diagnosis not present

## 2019-10-11 DIAGNOSIS — F419 Anxiety disorder, unspecified: Secondary | ICD-10-CM | POA: Diagnosis not present

## 2019-10-11 DIAGNOSIS — J449 Chronic obstructive pulmonary disease, unspecified: Secondary | ICD-10-CM | POA: Diagnosis not present

## 2019-10-17 DIAGNOSIS — Z9221 Personal history of antineoplastic chemotherapy: Secondary | ICD-10-CM | POA: Diagnosis not present

## 2019-10-17 DIAGNOSIS — Z8719 Personal history of other diseases of the digestive system: Secondary | ICD-10-CM | POA: Diagnosis not present

## 2019-10-17 DIAGNOSIS — C8290 Follicular lymphoma, unspecified, unspecified site: Secondary | ICD-10-CM | POA: Diagnosis not present

## 2019-10-17 DIAGNOSIS — C8331 Diffuse large B-cell lymphoma, lymph nodes of head, face, and neck: Secondary | ICD-10-CM | POA: Diagnosis not present

## 2019-10-17 DIAGNOSIS — K529 Noninfective gastroenteritis and colitis, unspecified: Secondary | ICD-10-CM | POA: Diagnosis not present

## 2019-10-17 DIAGNOSIS — R531 Weakness: Secondary | ICD-10-CM | POA: Diagnosis not present

## 2019-10-17 DIAGNOSIS — Z8616 Personal history of COVID-19: Secondary | ICD-10-CM | POA: Diagnosis not present

## 2019-10-17 DIAGNOSIS — Z9484 Stem cells transplant status: Secondary | ICD-10-CM | POA: Diagnosis not present

## 2019-10-17 DIAGNOSIS — Z7409 Other reduced mobility: Secondary | ICD-10-CM | POA: Diagnosis not present

## 2019-10-24 DIAGNOSIS — R42 Dizziness and giddiness: Secondary | ICD-10-CM | POA: Diagnosis not present

## 2019-10-24 DIAGNOSIS — Z8616 Personal history of COVID-19: Secondary | ICD-10-CM | POA: Diagnosis not present

## 2019-10-24 DIAGNOSIS — Z9484 Stem cells transplant status: Secondary | ICD-10-CM | POA: Diagnosis not present

## 2019-10-24 DIAGNOSIS — Z79899 Other long term (current) drug therapy: Secondary | ICD-10-CM | POA: Diagnosis not present

## 2019-10-24 DIAGNOSIS — Z791 Long term (current) use of non-steroidal anti-inflammatories (NSAID): Secondary | ICD-10-CM | POA: Diagnosis not present

## 2019-10-24 DIAGNOSIS — C8331 Diffuse large B-cell lymphoma, lymph nodes of head, face, and neck: Secondary | ICD-10-CM | POA: Diagnosis not present

## 2019-10-24 DIAGNOSIS — Z7951 Long term (current) use of inhaled steroids: Secondary | ICD-10-CM | POA: Diagnosis not present

## 2019-10-24 DIAGNOSIS — A0472 Enterocolitis due to Clostridium difficile, not specified as recurrent: Secondary | ICD-10-CM | POA: Diagnosis not present

## 2019-10-24 DIAGNOSIS — C8338 Diffuse large B-cell lymphoma, lymph nodes of multiple sites: Secondary | ICD-10-CM | POA: Diagnosis not present

## 2019-10-27 ENCOUNTER — Telehealth: Payer: Self-pay | Admitting: *Deleted

## 2019-10-27 DIAGNOSIS — D649 Anemia, unspecified: Secondary | ICD-10-CM | POA: Diagnosis not present

## 2019-10-27 DIAGNOSIS — R197 Diarrhea, unspecified: Secondary | ICD-10-CM | POA: Diagnosis not present

## 2019-10-27 DIAGNOSIS — M79641 Pain in right hand: Secondary | ICD-10-CM | POA: Diagnosis not present

## 2019-10-27 DIAGNOSIS — E876 Hypokalemia: Secondary | ICD-10-CM | POA: Diagnosis not present

## 2019-10-27 DIAGNOSIS — R2689 Other abnormalities of gait and mobility: Secondary | ICD-10-CM | POA: Diagnosis not present

## 2019-10-27 NOTE — Telephone Encounter (Signed)
Left message to schedule an appointment.

## 2019-10-31 DIAGNOSIS — Z79899 Other long term (current) drug therapy: Secondary | ICD-10-CM | POA: Diagnosis not present

## 2019-10-31 DIAGNOSIS — C8331 Diffuse large B-cell lymphoma, lymph nodes of head, face, and neck: Secondary | ICD-10-CM | POA: Diagnosis not present

## 2019-11-07 DIAGNOSIS — M4726 Other spondylosis with radiculopathy, lumbar region: Secondary | ICD-10-CM | POA: Diagnosis not present

## 2019-11-08 DIAGNOSIS — J45901 Unspecified asthma with (acute) exacerbation: Secondary | ICD-10-CM | POA: Diagnosis not present

## 2019-11-10 DIAGNOSIS — M4726 Other spondylosis with radiculopathy, lumbar region: Secondary | ICD-10-CM | POA: Diagnosis not present

## 2019-11-14 DIAGNOSIS — M791 Myalgia, unspecified site: Secondary | ICD-10-CM | POA: Diagnosis not present

## 2019-11-14 DIAGNOSIS — C8331 Diffuse large B-cell lymphoma, lymph nodes of head, face, and neck: Secondary | ICD-10-CM | POA: Diagnosis not present

## 2019-11-14 DIAGNOSIS — C8299 Follicular lymphoma, unspecified, extranodal and solid organ sites: Secondary | ICD-10-CM | POA: Diagnosis not present

## 2019-11-14 DIAGNOSIS — N390 Urinary tract infection, site not specified: Secondary | ICD-10-CM | POA: Diagnosis not present

## 2019-11-14 DIAGNOSIS — Z91040 Latex allergy status: Secondary | ICD-10-CM | POA: Diagnosis not present

## 2019-11-14 DIAGNOSIS — J069 Acute upper respiratory infection, unspecified: Secondary | ICD-10-CM | POA: Diagnosis not present

## 2019-11-14 DIAGNOSIS — M2550 Pain in unspecified joint: Secondary | ICD-10-CM | POA: Diagnosis not present

## 2019-11-17 DIAGNOSIS — M4726 Other spondylosis with radiculopathy, lumbar region: Secondary | ICD-10-CM | POA: Diagnosis not present

## 2019-11-21 DIAGNOSIS — T50995A Adverse effect of other drugs, medicaments and biological substances, initial encounter: Secondary | ICD-10-CM | POA: Diagnosis not present

## 2019-11-21 DIAGNOSIS — K219 Gastro-esophageal reflux disease without esophagitis: Secondary | ICD-10-CM | POA: Diagnosis not present

## 2019-11-21 DIAGNOSIS — E876 Hypokalemia: Secondary | ICD-10-CM | POA: Diagnosis not present

## 2019-11-21 DIAGNOSIS — C8330 Diffuse large B-cell lymphoma, unspecified site: Secondary | ICD-10-CM | POA: Diagnosis not present

## 2019-11-21 DIAGNOSIS — R197 Diarrhea, unspecified: Secondary | ICD-10-CM | POA: Diagnosis not present

## 2019-11-21 DIAGNOSIS — E860 Dehydration: Secondary | ICD-10-CM | POA: Diagnosis not present

## 2019-11-21 DIAGNOSIS — J069 Acute upper respiratory infection, unspecified: Secondary | ICD-10-CM | POA: Diagnosis not present

## 2019-11-21 DIAGNOSIS — Z9484 Stem cells transplant status: Secondary | ICD-10-CM | POA: Diagnosis not present

## 2019-11-21 DIAGNOSIS — K521 Toxic gastroenteritis and colitis: Secondary | ICD-10-CM | POA: Diagnosis not present

## 2019-11-21 DIAGNOSIS — I10 Essential (primary) hypertension: Secondary | ICD-10-CM | POA: Diagnosis not present

## 2019-11-21 DIAGNOSIS — Z8616 Personal history of COVID-19: Secondary | ICD-10-CM | POA: Diagnosis not present

## 2019-11-21 DIAGNOSIS — J449 Chronic obstructive pulmonary disease, unspecified: Secondary | ICD-10-CM | POA: Diagnosis not present

## 2019-11-21 DIAGNOSIS — F17210 Nicotine dependence, cigarettes, uncomplicated: Secondary | ICD-10-CM | POA: Diagnosis not present

## 2019-11-21 DIAGNOSIS — C8510 Unspecified B-cell lymphoma, unspecified site: Secondary | ICD-10-CM | POA: Diagnosis not present

## 2019-11-21 DIAGNOSIS — E119 Type 2 diabetes mellitus without complications: Secondary | ICD-10-CM | POA: Diagnosis not present

## 2019-11-22 ENCOUNTER — Telehealth: Payer: Self-pay | Admitting: *Deleted

## 2019-11-22 NOTE — Telephone Encounter (Signed)
Left message to schedule a hospital follow up

## 2019-11-28 ENCOUNTER — Encounter: Payer: Self-pay | Admitting: Family

## 2019-12-01 DIAGNOSIS — Z8719 Personal history of other diseases of the digestive system: Secondary | ICD-10-CM | POA: Diagnosis not present

## 2019-12-01 DIAGNOSIS — Z8616 Personal history of COVID-19: Secondary | ICD-10-CM | POA: Diagnosis not present

## 2019-12-01 DIAGNOSIS — Z7952 Long term (current) use of systemic steroids: Secondary | ICD-10-CM | POA: Diagnosis not present

## 2019-12-01 DIAGNOSIS — Z9484 Stem cells transplant status: Secondary | ICD-10-CM | POA: Diagnosis not present

## 2019-12-01 DIAGNOSIS — C8331 Diffuse large B-cell lymphoma, lymph nodes of head, face, and neck: Secondary | ICD-10-CM | POA: Diagnosis not present

## 2019-12-01 DIAGNOSIS — C8338 Diffuse large B-cell lymphoma, lymph nodes of multiple sites: Secondary | ICD-10-CM | POA: Diagnosis not present

## 2019-12-05 DIAGNOSIS — E876 Hypokalemia: Secondary | ICD-10-CM | POA: Diagnosis not present

## 2019-12-05 DIAGNOSIS — R799 Abnormal finding of blood chemistry, unspecified: Secondary | ICD-10-CM | POA: Diagnosis not present

## 2019-12-09 DIAGNOSIS — J45901 Unspecified asthma with (acute) exacerbation: Secondary | ICD-10-CM | POA: Diagnosis not present

## 2019-12-12 DIAGNOSIS — M4726 Other spondylosis with radiculopathy, lumbar region: Secondary | ICD-10-CM | POA: Diagnosis not present

## 2019-12-19 DIAGNOSIS — M4726 Other spondylosis with radiculopathy, lumbar region: Secondary | ICD-10-CM | POA: Diagnosis not present

## 2019-12-22 DIAGNOSIS — M4726 Other spondylosis with radiculopathy, lumbar region: Secondary | ICD-10-CM | POA: Diagnosis not present

## 2019-12-26 DIAGNOSIS — C8331 Diffuse large B-cell lymphoma, lymph nodes of head, face, and neck: Secondary | ICD-10-CM | POA: Diagnosis not present

## 2019-12-28 DIAGNOSIS — H2511 Age-related nuclear cataract, right eye: Secondary | ICD-10-CM | POA: Diagnosis not present

## 2019-12-28 DIAGNOSIS — H25011 Cortical age-related cataract, right eye: Secondary | ICD-10-CM | POA: Diagnosis not present

## 2019-12-28 DIAGNOSIS — H35363 Drusen (degenerative) of macula, bilateral: Secondary | ICD-10-CM | POA: Diagnosis not present

## 2019-12-28 DIAGNOSIS — E119 Type 2 diabetes mellitus without complications: Secondary | ICD-10-CM | POA: Diagnosis not present

## 2019-12-29 DIAGNOSIS — R197 Diarrhea, unspecified: Secondary | ICD-10-CM | POA: Diagnosis not present

## 2019-12-29 DIAGNOSIS — C8338 Diffuse large B-cell lymphoma, lymph nodes of multiple sites: Secondary | ICD-10-CM | POA: Diagnosis not present

## 2019-12-29 DIAGNOSIS — Z8616 Personal history of COVID-19: Secondary | ICD-10-CM | POA: Diagnosis not present

## 2019-12-29 DIAGNOSIS — M545 Low back pain, unspecified: Secondary | ICD-10-CM | POA: Diagnosis not present

## 2019-12-29 DIAGNOSIS — C8330 Diffuse large B-cell lymphoma, unspecified site: Secondary | ICD-10-CM | POA: Diagnosis not present

## 2019-12-30 ENCOUNTER — Other Ambulatory Visit
Admission: RE | Admit: 2019-12-30 | Discharge: 2019-12-30 | Disposition: A | Discharge status: Home / Self Care | Payer: PPO | Source: Ambulatory Visit | Attending: Family | Admitting: Family

## 2019-12-30 DIAGNOSIS — R197 Diarrhea, unspecified: Secondary | ICD-10-CM | POA: Diagnosis not present

## 2019-12-30 DIAGNOSIS — C8590 Non-Hodgkin lymphoma, unspecified, unspecified site: Secondary | ICD-10-CM | POA: Diagnosis not present

## 2019-12-30 LAB — COMPREHENSIVE METABOLIC PANEL
ALT: 14 U/L (ref 0–44)
AST: 17 U/L (ref 15–41)
Albumin: 3.6 g/dL (ref 3.5–5.0)
Alkaline Phosphatase: 69 U/L (ref 38–126)
Anion gap: 8 (ref 5–15)
BUN: 13 mg/dL (ref 8–23)
CO2: 23 mmol/L (ref 22–32)
Calcium: 8.7 mg/dL — ABNORMAL LOW (ref 8.9–10.3)
Chloride: 110 mmol/L (ref 98–111)
Creatinine, Ser: 0.8 mg/dL (ref 0.44–1.00)
GFR, Estimated: >60 mL/min (ref >60)
Glucose, Bld: 117 mg/dL — ABNORMAL HIGH (ref 70–99)
Potassium: 2.9 mmol/L — ABNORMAL LOW (ref 3.5–5.1)
Sodium: 141 mmol/L (ref 135–145)
Total Bilirubin: 0.6 mg/dL (ref 0.3–1.2)
Total Protein: 6.1 g/dL — ABNORMAL LOW (ref 6.5–8.1)

## 2019-12-30 LAB — MAGNESIUM: Magnesium: 1.6 mg/dL — ABNORMAL LOW (ref 1.7–2.4)

## 2019-12-30 LAB — CBC
HCT: 30 % — ABNORMAL LOW (ref 36.0–46.0)
Hemoglobin: 9.9 g/dL — ABNORMAL LOW (ref 12.0–15.0)
MCH: 29.5 pg (ref 26.0–34.0)
MCHC: 33 g/dL (ref 30.0–36.0)
MCV: 89.3 fL (ref 80.0–100.0)
Platelets: 152 10*3/uL (ref 150–400)
RBC: 3.36 MIL/uL — ABNORMAL LOW (ref 3.87–5.11)
RDW: 15.1 % (ref 11.5–15.5)
WBC: 5.2 10*3/uL (ref 4.0–10.5)
nRBC: 0 % (ref 0.0–0.2)

## 2020-01-03 DIAGNOSIS — R197 Diarrhea, unspecified: Secondary | ICD-10-CM | POA: Diagnosis not present

## 2020-01-08 DIAGNOSIS — J45901 Unspecified asthma with (acute) exacerbation: Secondary | ICD-10-CM | POA: Diagnosis not present

## 2020-01-09 DIAGNOSIS — F331 Major depressive disorder, recurrent, moderate: Secondary | ICD-10-CM | POA: Diagnosis not present

## 2020-01-09 DIAGNOSIS — M545 Low back pain, unspecified: Secondary | ICD-10-CM | POA: Diagnosis not present

## 2020-01-09 DIAGNOSIS — I10 Essential (primary) hypertension: Secondary | ICD-10-CM | POA: Diagnosis not present

## 2020-01-09 DIAGNOSIS — G894 Chronic pain syndrome: Secondary | ICD-10-CM | POA: Diagnosis not present

## 2020-01-09 DIAGNOSIS — Z79891 Long term (current) use of opiate analgesic: Secondary | ICD-10-CM | POA: Diagnosis not present

## 2020-01-09 DIAGNOSIS — C8590 Non-Hodgkin lymphoma, unspecified, unspecified site: Secondary | ICD-10-CM | POA: Diagnosis not present

## 2020-01-09 DIAGNOSIS — J983 Compensatory emphysema: Secondary | ICD-10-CM | POA: Diagnosis not present

## 2020-01-13 ENCOUNTER — Emergency Department
Admission: EM | Admit: 2020-01-13 | Discharge: 2020-01-13 | Disposition: A | Discharge status: Home / Self Care | Payer: PPO | Attending: Emergency Medicine | Admitting: Emergency Medicine

## 2020-01-13 ENCOUNTER — Emergency Department: Payer: PPO

## 2020-01-13 ENCOUNTER — Other Ambulatory Visit: Payer: Self-pay

## 2020-01-13 DIAGNOSIS — R0902 Hypoxemia: Secondary | ICD-10-CM | POA: Diagnosis not present

## 2020-01-13 DIAGNOSIS — E119 Type 2 diabetes mellitus without complications: Secondary | ICD-10-CM | POA: Insufficient documentation

## 2020-01-13 DIAGNOSIS — I10 Essential (primary) hypertension: Secondary | ICD-10-CM | POA: Diagnosis not present

## 2020-01-13 DIAGNOSIS — R531 Weakness: Secondary | ICD-10-CM | POA: Insufficient documentation

## 2020-01-13 DIAGNOSIS — F17210 Nicotine dependence, cigarettes, uncomplicated: Secondary | ICD-10-CM | POA: Insufficient documentation

## 2020-01-13 DIAGNOSIS — Z79899 Other long term (current) drug therapy: Secondary | ICD-10-CM | POA: Diagnosis not present

## 2020-01-13 DIAGNOSIS — Z8616 Personal history of COVID-19: Secondary | ICD-10-CM | POA: Diagnosis not present

## 2020-01-13 DIAGNOSIS — I959 Hypotension, unspecified: Secondary | ICD-10-CM | POA: Diagnosis not present

## 2020-01-13 LAB — CBC
HCT: 35.1 % — ABNORMAL LOW (ref 36.0–46.0)
Hemoglobin: 11.7 g/dL — ABNORMAL LOW (ref 12.0–15.0)
MCH: 29.2 pg (ref 26.0–34.0)
MCHC: 33.3 g/dL (ref 30.0–36.0)
MCV: 87.5 fL (ref 80.0–100.0)
Platelets: 168 10*3/uL (ref 150–400)
RBC: 4.01 MIL/uL (ref 3.87–5.11)
RDW: 15.1 % (ref 11.5–15.5)
WBC: 7.6 10*3/uL (ref 4.0–10.5)
nRBC: 0 % (ref 0.0–0.2)

## 2020-01-13 LAB — BASIC METABOLIC PANEL
Anion gap: 10 (ref 5–15)
BUN: 11 mg/dL (ref 8–23)
CO2: 24 mmol/L (ref 22–32)
Calcium: 9.1 mg/dL (ref 8.9–10.3)
Chloride: 101 mmol/L (ref 98–111)
Creatinine, Ser: 0.65 mg/dL (ref 0.44–1.00)
GFR, Estimated: >60 mL/min (ref >60)
Glucose, Bld: 120 mg/dL — ABNORMAL HIGH (ref 70–99)
Potassium: 4.2 mmol/L (ref 3.5–5.1)
Sodium: 135 mmol/L (ref 135–145)

## 2020-01-13 IMAGING — DX DG CHEST 1V PORT
1 series · 1 of 1 positions shown · non-contrast
Comparison: [DATE]

CLINICAL DATA: Weakness. Patient unsure of why she is here and
denies any symptoms.

EXAM:
PORTABLE CHEST 1 VIEW

[chest ap]
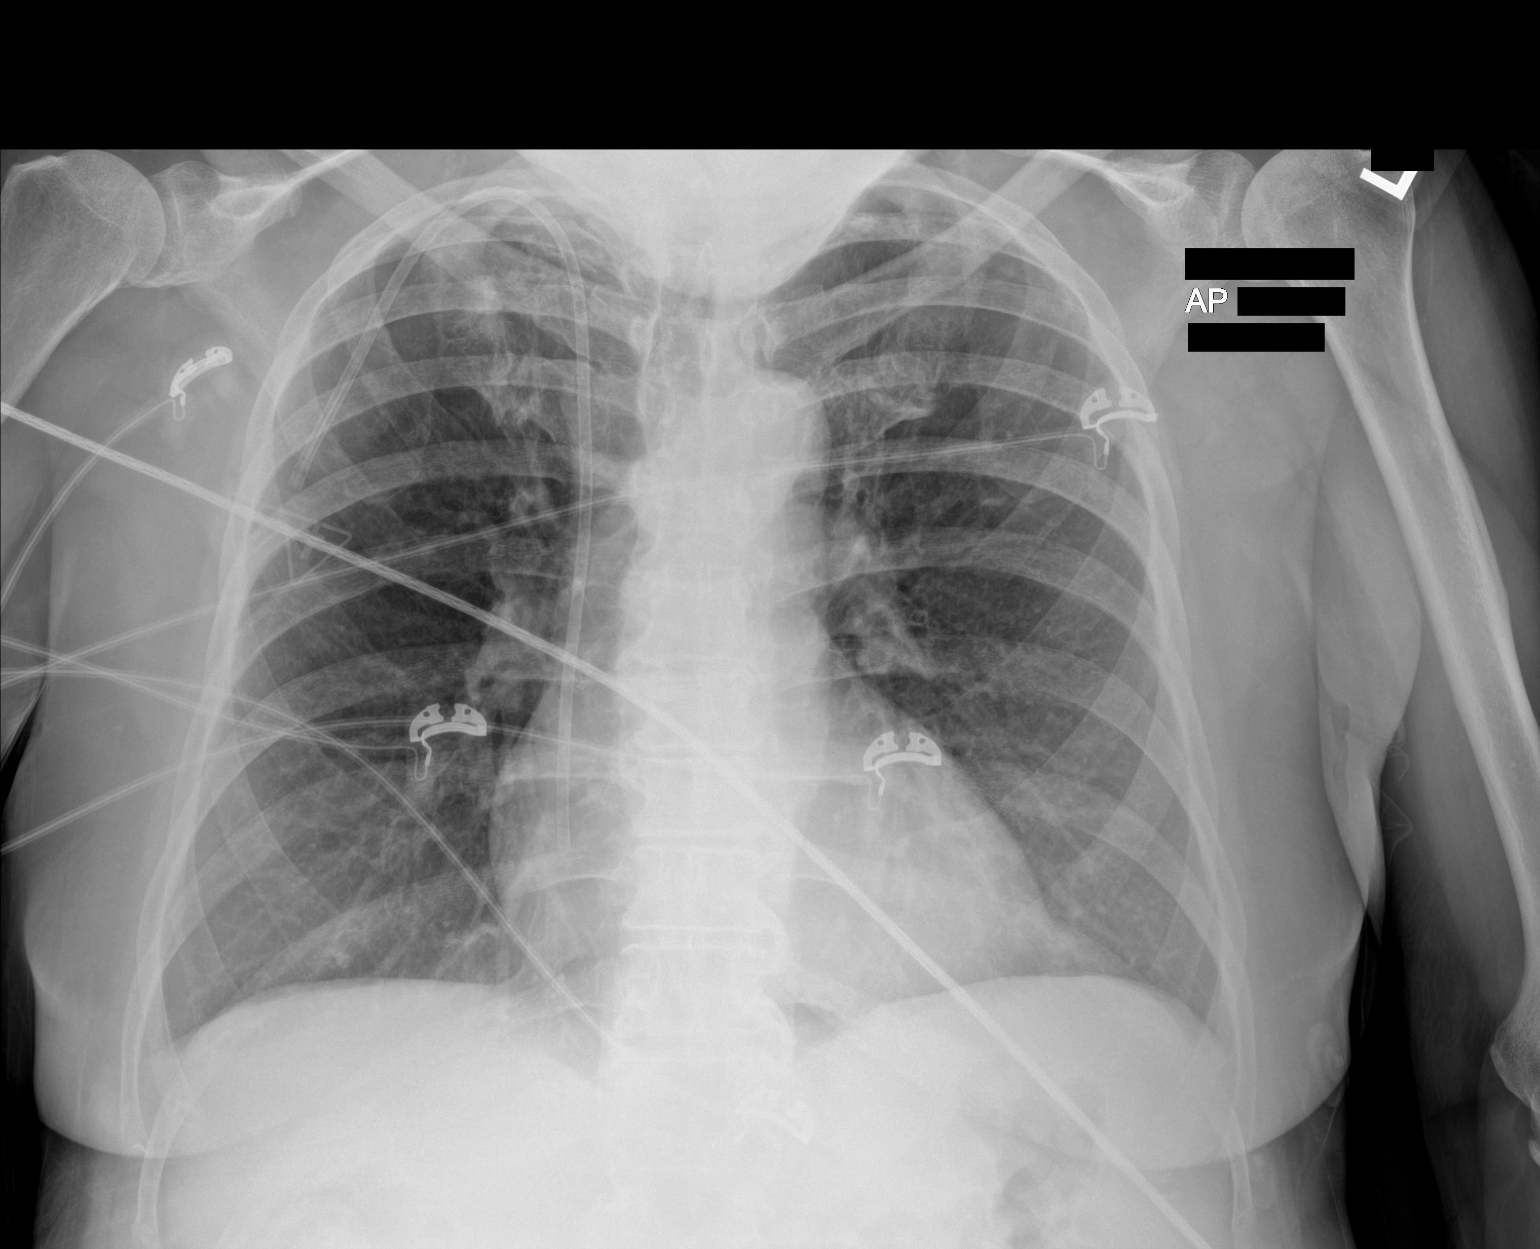

[1 of 1 positions shown; findings below may reference images not displayed]

FINDINGS: Cardiac silhouette is normal in size. Normal mediastinal and hilar
contours. Right anterior chest wall, internal jugular Port-A-Cath is
stable, tip in the right atrium.

Clear lungs.  No pleural effusion or pneumothorax.

Skeletal structures grossly intact.
IMPRESSION: No active disease.

## 2020-01-13 MED ORDER — SODIUM CHLORIDE 0.9 % IV SOLN
1000.0000 mL | Freq: Once | INTRAVENOUS | Status: AC
  Administered 2020-01-13: 1000 mL via INTRAVENOUS

## 2020-01-13 NOTE — ED Notes (Signed)
[place] sent to lab

## 2020-01-13 NOTE — ED Provider Notes (Signed)
[facility] Emergency Department Provider Note   ____________________________________________    I have reviewed the triage vital signs and the nursing notes.   HISTORY  Chief Complaint Weakness     HPI Beth Stanley is a 69 y.o. female with history of diabetes, hypertension, lymphoma who presents with complaints of generalized weakness.  Patient reports that her husband apparently had difficulty waking her this morning and she was having some diffuse weakness.  She denies any complaints.  No fevers chills cough.  No dysuria.  No nausea vomiting.  She does not know why she is feeling weak.  Review of medical records demonstrates the patient has had similar symptoms in the past, has typically resolved with hydration, related to dehydration.  Patient without complaints at this time  Past Medical History:  Diagnosis Date  . Anxiety   . Arthritis   . Diabetes ([place])   . Heartburn   . HTN (hypertension)   . Non Hodgkin's lymphoma ([place]) 2004   Hx chemo tx's.     Patient Active Problem List   Diagnosis Date Noted  . C. difficile colitis 10/01/2019  . COVID-19 virus infection 09/29/2019  . Acute metabolic encephalopathy 64/33/2951  . Hypokalemia 09/29/2019  . Encephalopathy acute 09/29/2019  . Smoker 04/25/2014  . Lymphoma ([place]) 02/03/2012  . Hyperlipidemia 02/03/2012  . Hypertension 02/03/2012  . Reflux 02/03/2012  . Depression 02/03/2012    Past Surgical History:  Procedure Laterality Date  . TOTAL ABDOMINAL HYSTERECTOMY      Prior to Admission medications   Medication Sig Start Date End Date Taking? Authorizing Provider  albuterol (VENTOLIN HFA) 108 (90 Base) MCG/ACT inhaler Inhale 1 puff into the lungs every 6 (six) hours as needed for wheezing. 09/02/19   [provider]  blood glucose meter kit and supplies KIT Test blood sugar once daily. Dx code: E11.9 06/27/15   [name], PA-C  ferrous sulfate 325 (65 FE) MG tablet  Take 1 tablet by mouth 3 (three) times a week. Monday Wednesday Friday 01/14/19   [provider]  FREESTYLE LITE test strip TEST BLOOD SUGAR ONCE DAILY 09/19/16   [name], PA-C  ketorolac (ACULAR) 0.5 % ophthalmic solution Place 1 drop into the left eye 4 (four) times daily. 08/31/19   [provider]  Lancets (FREESTYLE) lancets TEST BLOOD GLUCOSE ONCE DAILY 07/28/16   [name], PA-C  Multiple Vitamin (MULTIVITAMIN) tablet Take 1 tablet by mouth daily.    [provider]  ondansetron (ZOFRAN) 4 MG tablet Take 4 mg by mouth every 8 (eight) hours as needed for nausea or vomiting.  09/27/19   [provider]  potassium chloride SA (KLOR-CON) 20 MEQ tablet Take 20 mEq by mouth daily. 07/28/19   [provider]  prednisoLONE acetate (PRED FORTE) 1 % ophthalmic suspension Place 1 drop into the left eye daily.  08/31/19   [provider]  predniSONE (DELTASONE) 10 MG tablet Take 5 mg by mouth daily with breakfast.  08/26/19   [provider]  ramipril (ALTACE) 2.5 MG capsule Take 1 capsule (2.5 mg total) by mouth daily. 08/08/17   [name], PA-C  sertraline (ZOLOFT) 100 MG tablet TAKE ONE (1) TABLET EACH DAY Patient taking differently: Take 150 mg by mouth daily.  06/10/17   [name], PA-C  valACYclovir (VALTREX) 500 MG tablet Take 500 mg by mouth daily as needed. 06/15/19   [provider]  Allergies Aspirin, Other, and Tape  Family History  Problem Relation Age of Onset  . Diabetes Mother   . Lung cancer Sister   . Lung cancer Brother   . Kidney cancer Brother        removed kidney   . Lung cancer Brother   . Prostate cancer Neg Hx   . Bladder Cancer Neg Hx     Social History Social History   Tobacco Use  . Smoking status: Current Every Day Smoker    Packs/day: 1.00    Types: Cigarettes  . Smokeless tobacco: Never Used  Substance Use Topics  . Alcohol use: Not Currently    Alcohol/week:  0.0 standard drinks    Comment: Wine ocass  . Drug use: No    Review of Systems  Constitutional: No fever/chills Eyes: No visual changes.  ENT: No sore throat. Cardiovascular: Denies chest pain. Respiratory: Denies shortness of breath. Gastrointestinal: No abdominal pain.     Genitourinary: Negative for dysuria. Musculoskeletal: Negative for back pain. Skin: Negative for rash. Neurological: Negative for headaches   ____________________________________________   PHYSICAL EXAM:  VITAL SIGNS: ED Triage Vitals  Enc Vitals Group     BP 01/13/20 1130 134/70     Pulse Rate 01/13/20 1130 100     Resp 01/13/20 1130 16     Temp 01/13/20 1130 98.8 F (37.1 C)     Temp src --      SpO2 01/13/20 1130 95 %     Weight 01/13/20 1131 63.5 kg (140 lb)     Height 01/13/20 1131 1.6 m (_0 )     Head Circumference --      Peak Flow --      Pain Score 01/13/20 1131 0     Pain Loc --      Pain Edu? --      Excl. in [place]? --     Constitutional: Alert and oriented.   Nose: No congestion/rhinnorhea. Mouth/Throat: Mucous membranes are moist.    Cardiovascular: Normal rate, regular rhythm.  Good peripheral circulation. Respiratory: Normal respiratory effort.  No retractions.  Gastrointestinal: Soft and nontender. No distention.  No CVA tenderness.  Musculoskeletal: No lower extremity tenderness nor edema.  Warm and well perfused Neurologic:  Normal speech and language. No gross focal neurologic deficits are appreciated.  Skin:  Skin is warm, dry and intact. No rash noted. Psychiatric: Mood and affect are normal. Speech and behavior are normal.  ____________________________________________   LABS (all labs ordered are listed, but only abnormal results are displayed)  Labs Reviewed  BASIC METABOLIC PANEL - Abnormal; Notable for the following components:      Result Value   Glucose, Bld 120 (*)    All other components within normal limits  CBC - Abnormal; Notable for the following  components:   Hemoglobin 11.7 (*)    HCT 35.1 (*)    All other components within normal limits  URINALYSIS, COMPLETE (UACMP) WITH MICROSCOPIC   ____________________________________________  EKG  ED ECG REPORT I, Lavonia Drafts, the attending physician, personally viewed and interpreted this ECG.  Date: 01/13/2020  Rhythm: normal sinus rhythm QRS Axis: normal Intervals: normal ST/T Wave abnormalities: normal Narrative Interpretation: no evidence of acute ischemia  ____________________________________________  RADIOLOGY  Chest x-ray read by me, no infiltrate or effusion ____________________________________________   PROCEDURES  Procedure(s) performed: No  Procedures   Critical Care performed: No ____________________________________________   INITIAL IMPRESSION / ASSESSMENT AND PLAN / ED COURSE  Pertinent labs &  imaging results that were available during my care of the patient were reviewed by me and considered in my medical decision making (see chart for details).  Patient presents with vague generalized weakness.  She has no specific complaints.  Vital signs are reassuring, exam is unremarkable.  Lab work is overall reassuring as well  Review of records demonstrates a history of similar presentations resolved with IV fluids.  Will give normal saline while we await urinalysis.  X-ray is reassuring.  Patient ambulated well without difficulty, given reassuring work-up at this time and significant improvement, appropriate for discharge at this time.  Patient has no interest in being admitted to the hospital, return precautions discussed however    ____________________________________________   FINAL CLINICAL IMPRESSION(S) / ED DIAGNOSES  Final diagnoses:  Generalized weakness        Note:  This document was prepared using Dragon voice recognition software and may include unintentional dictation errors.   Lavonia Drafts, MD 01/13/20 [phone]

## 2020-01-13 NOTE — ED Triage Notes (Addendum)
Pt here via [facility] from home.  Pt reports she isn't sure why she is here, states the ambulance bought her here but doesn't know why. Denies feeling badly, denies any pain. Alert, oriented to self, time and place.

## 2020-01-13 NOTE — ED Notes (Signed)
EKG to EDP [place] in person.  °

## 2020-01-13 NOTE — ED Triage Notes (Signed)
FIRST NURSE NOTE:  Pt arrived via [facility] with gen weakness for several days, hx of the same related to dehydration,  VSS.

## 2020-01-13 NOTE — ED Notes (Signed)
Pt ambulated to bathroom. Pt ambulated back to room.

## 2020-01-13 NOTE — ED Notes (Signed)
Pt unable to wait to use restroom. Pt urinated and passed a BM in brief. This tech assisted pt, with help from pt husband, to bathroom. Pt cleaned and given clean brief and blue paper pants. Pt comfortable in bed with no other needs at this time.

## 2020-01-16 ENCOUNTER — Telehealth: Payer: Self-pay | Admitting: *Deleted

## 2020-01-16 NOTE — Telephone Encounter (Signed)
Is she still coming to Primary Care at [facility].  Will need a TOC  Schedule a hospital follow up

## 2020-01-20 DIAGNOSIS — M4316 Spondylolisthesis, lumbar region: Secondary | ICD-10-CM | POA: Diagnosis not present

## 2020-01-23 ENCOUNTER — Other Ambulatory Visit: Payer: Self-pay | Admitting: Family

## 2020-01-23 DIAGNOSIS — R059 Cough, unspecified: Secondary | ICD-10-CM

## 2020-01-31 DIAGNOSIS — R799 Abnormal finding of blood chemistry, unspecified: Secondary | ICD-10-CM | POA: Diagnosis not present

## 2020-01-31 DIAGNOSIS — F419 Anxiety disorder, unspecified: Secondary | ICD-10-CM | POA: Diagnosis not present

## 2020-01-31 DIAGNOSIS — E559 Vitamin D deficiency, unspecified: Secondary | ICD-10-CM | POA: Diagnosis not present

## 2020-01-31 DIAGNOSIS — R5383 Other fatigue: Secondary | ICD-10-CM | POA: Diagnosis not present

## 2020-01-31 DIAGNOSIS — J449 Chronic obstructive pulmonary disease, unspecified: Secondary | ICD-10-CM | POA: Diagnosis not present

## 2020-01-31 DIAGNOSIS — K219 Gastro-esophageal reflux disease without esophagitis: Secondary | ICD-10-CM | POA: Diagnosis not present

## 2020-02-06 DIAGNOSIS — D492 Neoplasm of unspecified behavior of bone, soft tissue, and skin: Secondary | ICD-10-CM | POA: Diagnosis not present

## 2020-02-06 DIAGNOSIS — E119 Type 2 diabetes mellitus without complications: Secondary | ICD-10-CM | POA: Diagnosis not present

## 2020-02-06 DIAGNOSIS — H2511 Age-related nuclear cataract, right eye: Secondary | ICD-10-CM | POA: Diagnosis not present

## 2020-02-06 DIAGNOSIS — H35363 Drusen (degenerative) of macula, bilateral: Secondary | ICD-10-CM | POA: Diagnosis not present

## 2020-02-08 DIAGNOSIS — M48062 Spinal stenosis, lumbar region with neurogenic claudication: Secondary | ICD-10-CM | POA: Diagnosis not present

## 2020-02-08 DIAGNOSIS — J45901 Unspecified asthma with (acute) exacerbation: Secondary | ICD-10-CM | POA: Diagnosis not present

## 2020-02-13 DIAGNOSIS — C8331 Diffuse large B-cell lymphoma, lymph nodes of head, face, and neck: Secondary | ICD-10-CM | POA: Diagnosis not present

## 2020-02-15 DIAGNOSIS — M5416 Radiculopathy, lumbar region: Secondary | ICD-10-CM | POA: Diagnosis not present

## 2020-02-20 ENCOUNTER — Other Ambulatory Visit
Admission: RE | Admit: 2020-02-20 | Discharge: 2020-02-20 | Disposition: A | Discharge status: Home / Self Care | Payer: PPO | Attending: Family | Admitting: Family

## 2020-02-20 DIAGNOSIS — Z8616 Personal history of COVID-19: Secondary | ICD-10-CM | POA: Diagnosis not present

## 2020-02-20 DIAGNOSIS — H029 Unspecified disorder of eyelid: Secondary | ICD-10-CM | POA: Diagnosis not present

## 2020-02-20 DIAGNOSIS — R059 Cough, unspecified: Secondary | ICD-10-CM | POA: Diagnosis not present

## 2020-02-20 DIAGNOSIS — E876 Hypokalemia: Secondary | ICD-10-CM | POA: Diagnosis not present

## 2020-02-20 LAB — CBC
HCT: 37.7 % (ref 36.0–46.0)
Hemoglobin: 12 g/dL (ref 12.0–15.0)
MCH: 28.6 pg (ref 26.0–34.0)
MCHC: 31.8 g/dL (ref 30.0–36.0)
MCV: 90 fL (ref 80.0–100.0)
Platelets: 225 10*3/uL (ref 150–400)
RBC: 4.19 MIL/uL (ref 3.87–5.11)
RDW: 15.2 % (ref 11.5–15.5)
WBC: 10.4 10*3/uL (ref 4.0–10.5)
nRBC: 0 % (ref 0.0–0.2)

## 2020-02-20 LAB — COMPREHENSIVE METABOLIC PANEL
ALT: 11 U/L (ref 0–44)
AST: 17 U/L (ref 15–41)
Albumin: 4.5 g/dL (ref 3.5–5.0)
Alkaline Phosphatase: 96 U/L (ref 38–126)
Anion gap: 10 (ref 5–15)
BUN: 17 mg/dL (ref 8–23)
CO2: 23 mmol/L (ref 22–32)
Calcium: 9.5 mg/dL (ref 8.9–10.3)
Chloride: 105 mmol/L (ref 98–111)
Creatinine, Ser: 1.01 mg/dL — ABNORMAL HIGH (ref 0.44–1.00)
GFR, Estimated: >60 mL/min (ref >60)
Glucose, Bld: 190 mg/dL — ABNORMAL HIGH (ref 70–99)
Potassium: 4.6 mmol/L (ref 3.5–5.1)
Sodium: 138 mmol/L (ref 135–145)
Total Bilirubin: 0.4 mg/dL (ref 0.3–1.2)
Total Protein: 7.8 g/dL (ref 6.5–8.1)

## 2020-02-20 LAB — MAGNESIUM: Magnesium: 1.9 mg/dL (ref 1.7–2.4)

## 2020-02-27 DIAGNOSIS — M48061 Spinal stenosis, lumbar region without neurogenic claudication: Secondary | ICD-10-CM | POA: Diagnosis not present

## 2020-03-02 DIAGNOSIS — M48061 Spinal stenosis, lumbar region without neurogenic claudication: Secondary | ICD-10-CM | POA: Diagnosis not present

## 2020-03-05 ENCOUNTER — Other Ambulatory Visit: Payer: Self-pay

## 2020-03-05 ENCOUNTER — Ambulatory Visit
Admission: RE | Admit: 2020-03-05 | Discharge: 2020-03-05 | Disposition: A | Discharge status: Home / Self Care | Payer: PPO | Source: Ambulatory Visit | Attending: Family | Admitting: Family

## 2020-03-05 DIAGNOSIS — R059 Cough, unspecified: Secondary | ICD-10-CM | POA: Diagnosis not present

## 2020-03-05 DIAGNOSIS — R079 Chest pain, unspecified: Secondary | ICD-10-CM | POA: Diagnosis not present

## 2020-03-05 DIAGNOSIS — R5383 Other fatigue: Secondary | ICD-10-CM | POA: Diagnosis not present

## 2020-03-05 DIAGNOSIS — J441 Chronic obstructive pulmonary disease with (acute) exacerbation: Secondary | ICD-10-CM | POA: Diagnosis not present

## 2020-03-05 DIAGNOSIS — R051 Acute cough: Secondary | ICD-10-CM | POA: Diagnosis not present

## 2020-03-05 DIAGNOSIS — R0602 Shortness of breath: Secondary | ICD-10-CM | POA: Diagnosis not present

## 2020-03-05 IMAGING — CR DG CHEST 2V
1 series · 2 of 2 positions shown · non-contrast
Comparison: [DATE]

CLINICAL DATA: Cough and chest pain.  Shortness of breath.

EXAM:
CHEST - 2 VIEW

[Series 1: w chest pa · 0.14mm/px · 2 of 2 slices shown]
[im 1/2]
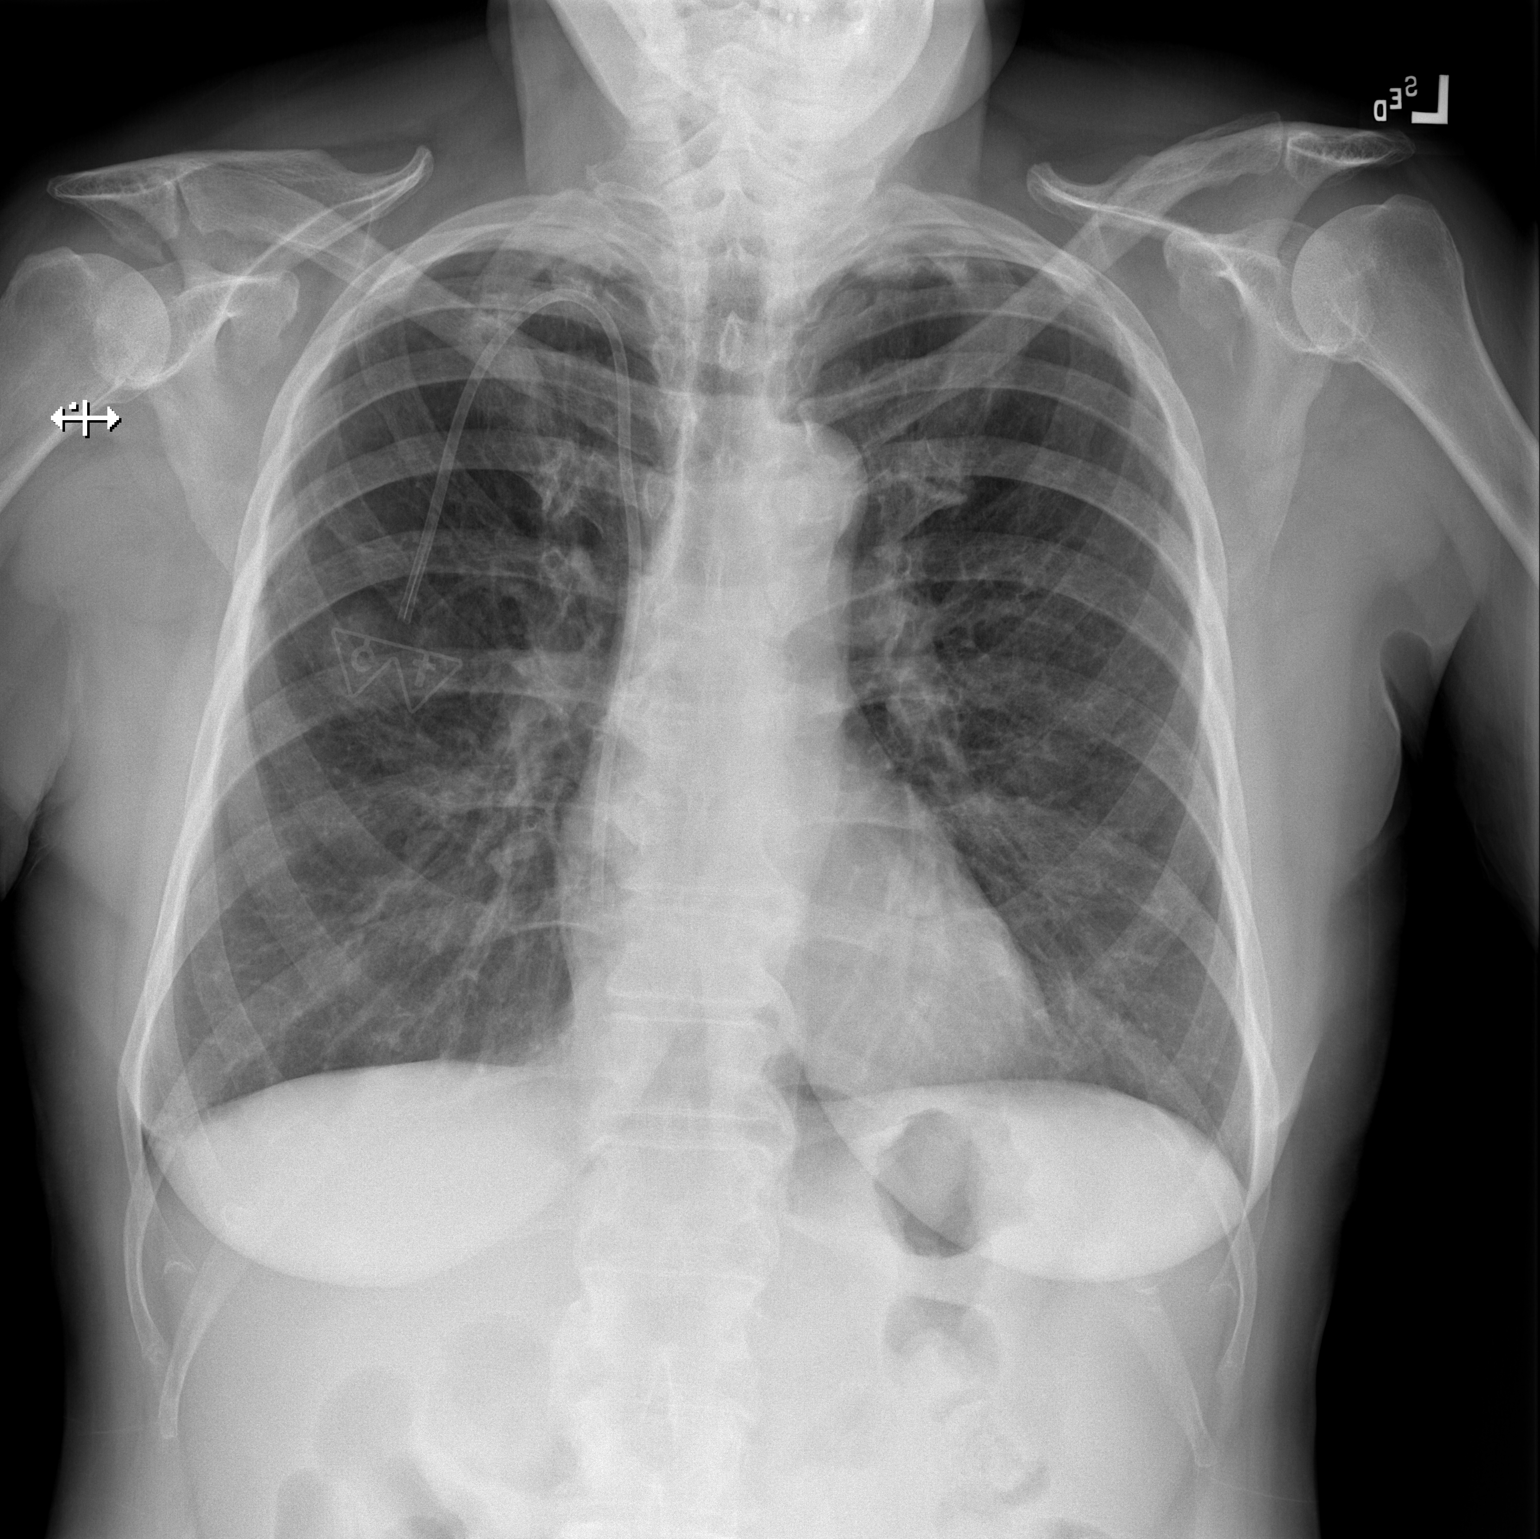
[im 2/2]
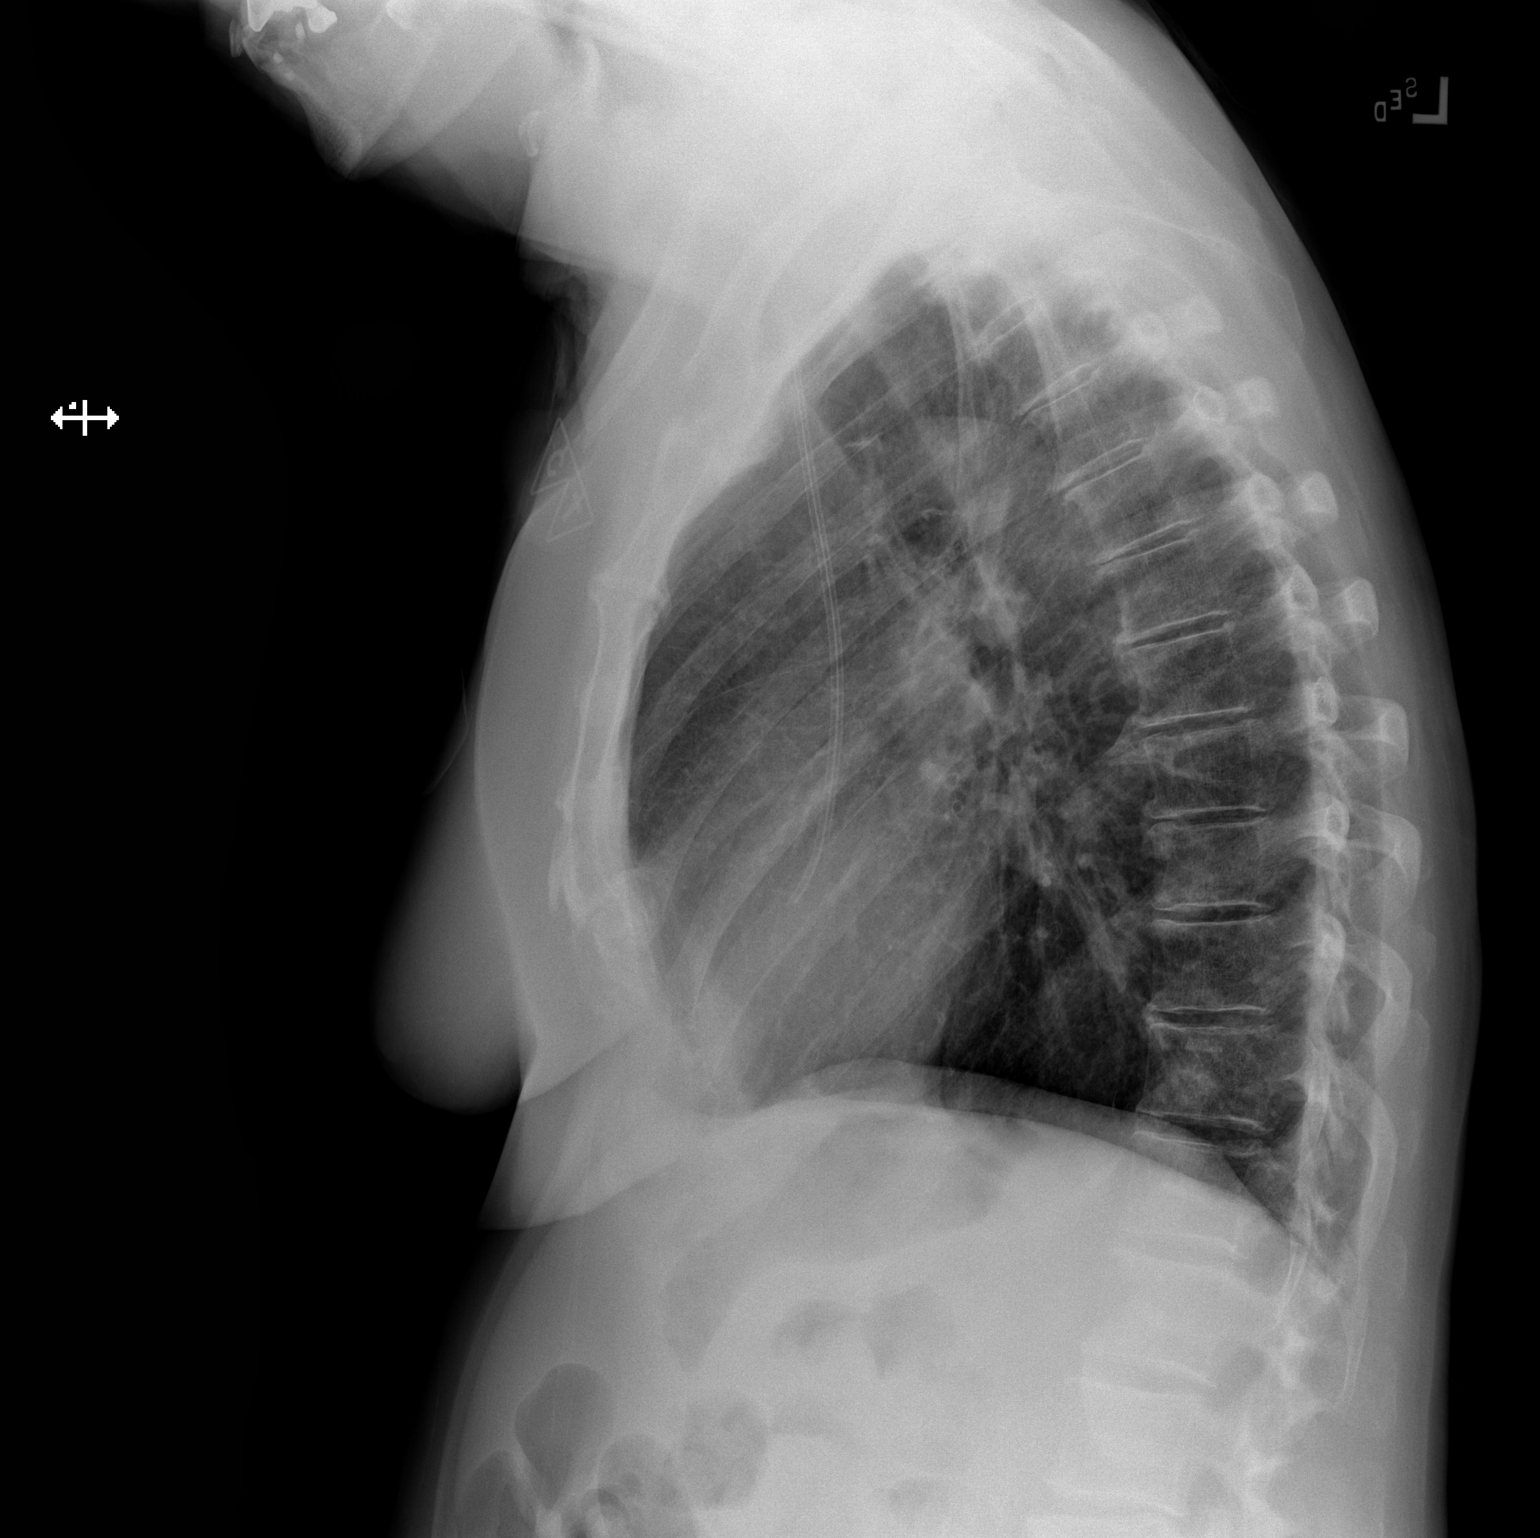

[2 of 2 positions shown; findings below may reference images not displayed]

FINDINGS: Power port tip is at the cavoatrial junction. No pneumothorax. There
is scarring in the apices bilaterally, stable. Lungs elsewhere
clear. Heart size and pulmonary vascularity are normal. No
adenopathy. No pneumothorax. No bone lesions.
IMPRESSION: Power port as described. Scarring in each apex. Lungs elsewhere
clear. Cardiac silhouette normal.

## 2020-03-08 DIAGNOSIS — L720 Epidermal cyst: Secondary | ICD-10-CM | POA: Diagnosis not present

## 2020-03-08 DIAGNOSIS — D210 Benign neoplasm of connective and other soft tissue of head, face and neck: Secondary | ICD-10-CM | POA: Diagnosis not present

## 2020-03-08 DIAGNOSIS — H029 Unspecified disorder of eyelid: Secondary | ICD-10-CM | POA: Diagnosis not present

## 2020-03-10 DIAGNOSIS — J45901 Unspecified asthma with (acute) exacerbation: Secondary | ICD-10-CM | POA: Diagnosis not present

## 2020-03-24 DIAGNOSIS — H16142 Punctate keratitis, left eye: Secondary | ICD-10-CM | POA: Diagnosis not present

## 2020-04-02 DIAGNOSIS — E876 Hypokalemia: Secondary | ICD-10-CM | POA: Diagnosis not present

## 2020-04-02 DIAGNOSIS — J449 Chronic obstructive pulmonary disease, unspecified: Secondary | ICD-10-CM | POA: Diagnosis not present

## 2020-04-02 DIAGNOSIS — R1030 Lower abdominal pain, unspecified: Secondary | ICD-10-CM | POA: Diagnosis not present

## 2020-04-02 DIAGNOSIS — E559 Vitamin D deficiency, unspecified: Secondary | ICD-10-CM | POA: Diagnosis not present

## 2020-04-02 DIAGNOSIS — F419 Anxiety disorder, unspecified: Secondary | ICD-10-CM | POA: Diagnosis not present

## 2020-04-07 DIAGNOSIS — J45901 Unspecified asthma with (acute) exacerbation: Secondary | ICD-10-CM | POA: Diagnosis not present

## 2020-04-09 DIAGNOSIS — E876 Hypokalemia: Secondary | ICD-10-CM | POA: Diagnosis not present

## 2020-04-09 DIAGNOSIS — M545 Low back pain, unspecified: Secondary | ICD-10-CM | POA: Diagnosis not present

## 2020-04-09 DIAGNOSIS — E878 Other disorders of electrolyte and fluid balance, not elsewhere classified: Secondary | ICD-10-CM | POA: Diagnosis not present

## 2020-04-09 DIAGNOSIS — C8290 Follicular lymphoma, unspecified, unspecified site: Secondary | ICD-10-CM | POA: Diagnosis not present

## 2020-04-09 DIAGNOSIS — D649 Anemia, unspecified: Secondary | ICD-10-CM | POA: Diagnosis not present

## 2020-04-09 DIAGNOSIS — C8331 Diffuse large B-cell lymphoma, lymph nodes of head, face, and neck: Secondary | ICD-10-CM | POA: Diagnosis not present

## 2020-04-09 DIAGNOSIS — Z9221 Personal history of antineoplastic chemotherapy: Secondary | ICD-10-CM | POA: Diagnosis not present

## 2020-04-09 DIAGNOSIS — G8929 Other chronic pain: Secondary | ICD-10-CM | POA: Diagnosis not present

## 2020-04-09 DIAGNOSIS — Z9484 Stem cells transplant status: Secondary | ICD-10-CM | POA: Diagnosis not present

## 2020-04-23 DIAGNOSIS — M7918 Myalgia, other site: Secondary | ICD-10-CM | POA: Diagnosis not present

## 2020-04-23 DIAGNOSIS — M5440 Lumbago with sciatica, unspecified side: Secondary | ICD-10-CM | POA: Diagnosis not present

## 2020-04-25 DIAGNOSIS — M7918 Myalgia, other site: Secondary | ICD-10-CM | POA: Diagnosis not present

## 2020-04-25 DIAGNOSIS — M5440 Lumbago with sciatica, unspecified side: Secondary | ICD-10-CM | POA: Diagnosis not present

## 2020-04-30 DIAGNOSIS — M7918 Myalgia, other site: Secondary | ICD-10-CM | POA: Diagnosis not present

## 2020-04-30 DIAGNOSIS — M5440 Lumbago with sciatica, unspecified side: Secondary | ICD-10-CM | POA: Diagnosis not present

## 2020-05-02 DIAGNOSIS — M7918 Myalgia, other site: Secondary | ICD-10-CM | POA: Diagnosis not present

## 2020-05-02 DIAGNOSIS — M5440 Lumbago with sciatica, unspecified side: Secondary | ICD-10-CM | POA: Diagnosis not present

## 2020-05-08 DIAGNOSIS — J45901 Unspecified asthma with (acute) exacerbation: Secondary | ICD-10-CM | POA: Diagnosis not present

## 2020-05-09 DIAGNOSIS — M5440 Lumbago with sciatica, unspecified side: Secondary | ICD-10-CM | POA: Diagnosis not present

## 2020-05-09 DIAGNOSIS — M7918 Myalgia, other site: Secondary | ICD-10-CM | POA: Diagnosis not present

## 2020-05-16 DIAGNOSIS — M7918 Myalgia, other site: Secondary | ICD-10-CM | POA: Diagnosis not present

## 2020-05-16 DIAGNOSIS — M5440 Lumbago with sciatica, unspecified side: Secondary | ICD-10-CM | POA: Diagnosis not present

## 2020-05-23 DIAGNOSIS — M5440 Lumbago with sciatica, unspecified side: Secondary | ICD-10-CM | POA: Diagnosis not present

## 2020-05-23 DIAGNOSIS — M7918 Myalgia, other site: Secondary | ICD-10-CM | POA: Diagnosis not present

## 2020-05-24 DIAGNOSIS — M545 Low back pain, unspecified: Secondary | ICD-10-CM | POA: Diagnosis not present

## 2020-05-24 DIAGNOSIS — M5416 Radiculopathy, lumbar region: Secondary | ICD-10-CM | POA: Diagnosis not present

## 2020-05-30 DIAGNOSIS — M5440 Lumbago with sciatica, unspecified side: Secondary | ICD-10-CM | POA: Diagnosis not present

## 2020-05-30 DIAGNOSIS — M7918 Myalgia, other site: Secondary | ICD-10-CM | POA: Diagnosis not present

## 2020-06-04 DIAGNOSIS — M5440 Lumbago with sciatica, unspecified side: Secondary | ICD-10-CM | POA: Diagnosis not present

## 2020-06-04 DIAGNOSIS — M7918 Myalgia, other site: Secondary | ICD-10-CM | POA: Diagnosis not present

## 2020-06-06 DIAGNOSIS — M5440 Lumbago with sciatica, unspecified side: Secondary | ICD-10-CM | POA: Diagnosis not present

## 2020-06-06 DIAGNOSIS — M7918 Myalgia, other site: Secondary | ICD-10-CM | POA: Diagnosis not present

## 2020-06-07 DIAGNOSIS — J45901 Unspecified asthma with (acute) exacerbation: Secondary | ICD-10-CM | POA: Diagnosis not present

## 2020-06-18 DIAGNOSIS — M7918 Myalgia, other site: Secondary | ICD-10-CM | POA: Diagnosis not present

## 2020-06-18 DIAGNOSIS — M5440 Lumbago with sciatica, unspecified side: Secondary | ICD-10-CM | POA: Diagnosis not present

## 2020-06-25 DIAGNOSIS — Z20822 Contact with and (suspected) exposure to covid-19: Secondary | ICD-10-CM | POA: Diagnosis not present

## 2020-06-25 DIAGNOSIS — Z9181 History of falling: Secondary | ICD-10-CM | POA: Diagnosis not present

## 2020-06-25 DIAGNOSIS — F1120 Opioid dependence, uncomplicated: Secondary | ICD-10-CM | POA: Diagnosis not present

## 2020-06-25 DIAGNOSIS — Z79899 Other long term (current) drug therapy: Secondary | ICD-10-CM | POA: Diagnosis not present

## 2020-06-25 DIAGNOSIS — Z1159 Encounter for screening for other viral diseases: Secondary | ICD-10-CM | POA: Diagnosis not present

## 2020-06-25 DIAGNOSIS — Z131 Encounter for screening for diabetes mellitus: Secondary | ICD-10-CM | POA: Diagnosis not present

## 2020-06-25 DIAGNOSIS — M129 Arthropathy, unspecified: Secondary | ICD-10-CM | POA: Diagnosis not present

## 2020-06-25 DIAGNOSIS — E559 Vitamin D deficiency, unspecified: Secondary | ICD-10-CM | POA: Diagnosis not present

## 2020-06-25 DIAGNOSIS — F17210 Nicotine dependence, cigarettes, uncomplicated: Secondary | ICD-10-CM | POA: Diagnosis not present

## 2020-06-25 DIAGNOSIS — C8590 Non-Hodgkin lymphoma, unspecified, unspecified site: Secondary | ICD-10-CM | POA: Diagnosis not present

## 2020-06-26 DIAGNOSIS — M5440 Lumbago with sciatica, unspecified side: Secondary | ICD-10-CM | POA: Diagnosis not present

## 2020-06-26 DIAGNOSIS — M7918 Myalgia, other site: Secondary | ICD-10-CM | POA: Diagnosis not present

## 2020-06-28 DIAGNOSIS — Z79899 Other long term (current) drug therapy: Secondary | ICD-10-CM | POA: Diagnosis not present

## 2020-07-08 DIAGNOSIS — J45901 Unspecified asthma with (acute) exacerbation: Secondary | ICD-10-CM | POA: Diagnosis not present

## 2020-07-09 DIAGNOSIS — Z6822 Body mass index (BMI) 22.0-22.9, adult: Secondary | ICD-10-CM | POA: Diagnosis not present

## 2020-07-09 DIAGNOSIS — F17210 Nicotine dependence, cigarettes, uncomplicated: Secondary | ICD-10-CM | POA: Diagnosis not present

## 2020-07-09 DIAGNOSIS — Z79899 Other long term (current) drug therapy: Secondary | ICD-10-CM | POA: Diagnosis not present

## 2020-07-09 DIAGNOSIS — Z9181 History of falling: Secondary | ICD-10-CM | POA: Diagnosis not present

## 2020-07-09 DIAGNOSIS — E119 Type 2 diabetes mellitus without complications: Secondary | ICD-10-CM | POA: Diagnosis not present

## 2020-07-09 DIAGNOSIS — F1120 Opioid dependence, uncomplicated: Secondary | ICD-10-CM | POA: Diagnosis not present

## 2020-07-09 DIAGNOSIS — C8290 Follicular lymphoma, unspecified, unspecified site: Secondary | ICD-10-CM | POA: Diagnosis not present

## 2020-07-09 DIAGNOSIS — C8590 Non-Hodgkin lymphoma, unspecified, unspecified site: Secondary | ICD-10-CM | POA: Diagnosis not present

## 2020-07-09 DIAGNOSIS — N644 Mastodynia: Secondary | ICD-10-CM | POA: Diagnosis not present

## 2020-07-09 DIAGNOSIS — C8331 Diffuse large B-cell lymphoma, lymph nodes of head, face, and neck: Secondary | ICD-10-CM | POA: Diagnosis not present

## 2020-07-10 ENCOUNTER — Other Ambulatory Visit: Payer: Self-pay | Admitting: Internal Medicine

## 2020-07-10 ENCOUNTER — Other Ambulatory Visit: Payer: Self-pay | Admitting: Hematology and Oncology

## 2020-07-10 DIAGNOSIS — E119 Type 2 diabetes mellitus without complications: Secondary | ICD-10-CM | POA: Diagnosis not present

## 2020-07-10 DIAGNOSIS — N644 Mastodynia: Secondary | ICD-10-CM | POA: Diagnosis not present

## 2020-07-12 DIAGNOSIS — Z79899 Other long term (current) drug therapy: Secondary | ICD-10-CM | POA: Diagnosis not present

## 2020-07-19 DIAGNOSIS — M7918 Myalgia, other site: Secondary | ICD-10-CM | POA: Diagnosis not present

## 2020-07-19 DIAGNOSIS — M5440 Lumbago with sciatica, unspecified side: Secondary | ICD-10-CM | POA: Diagnosis not present

## 2020-07-25 DIAGNOSIS — M7918 Myalgia, other site: Secondary | ICD-10-CM | POA: Diagnosis not present

## 2020-07-25 DIAGNOSIS — M5440 Lumbago with sciatica, unspecified side: Secondary | ICD-10-CM | POA: Diagnosis not present

## 2020-07-30 DIAGNOSIS — M5440 Lumbago with sciatica, unspecified side: Secondary | ICD-10-CM | POA: Diagnosis not present

## 2020-07-30 DIAGNOSIS — M7918 Myalgia, other site: Secondary | ICD-10-CM | POA: Diagnosis not present

## 2020-08-07 DIAGNOSIS — J45901 Unspecified asthma with (acute) exacerbation: Secondary | ICD-10-CM | POA: Diagnosis not present

## 2020-08-08 DIAGNOSIS — Z79899 Other long term (current) drug therapy: Secondary | ICD-10-CM | POA: Diagnosis not present

## 2020-08-08 DIAGNOSIS — C8590 Non-Hodgkin lymphoma, unspecified, unspecified site: Secondary | ICD-10-CM | POA: Diagnosis not present

## 2020-08-08 DIAGNOSIS — M7918 Myalgia, other site: Secondary | ICD-10-CM | POA: Diagnosis not present

## 2020-08-08 DIAGNOSIS — Z6823 Body mass index (BMI) 23.0-23.9, adult: Secondary | ICD-10-CM | POA: Diagnosis not present

## 2020-08-08 DIAGNOSIS — Z9181 History of falling: Secondary | ICD-10-CM | POA: Diagnosis not present

## 2020-08-08 DIAGNOSIS — F1120 Opioid dependence, uncomplicated: Secondary | ICD-10-CM | POA: Diagnosis not present

## 2020-08-08 DIAGNOSIS — M5440 Lumbago with sciatica, unspecified side: Secondary | ICD-10-CM | POA: Diagnosis not present

## 2020-08-08 DIAGNOSIS — F17210 Nicotine dependence, cigarettes, uncomplicated: Secondary | ICD-10-CM | POA: Diagnosis not present

## 2020-08-10 DIAGNOSIS — M7918 Myalgia, other site: Secondary | ICD-10-CM | POA: Diagnosis not present

## 2020-08-10 DIAGNOSIS — M5440 Lumbago with sciatica, unspecified side: Secondary | ICD-10-CM | POA: Diagnosis not present

## 2020-08-10 DIAGNOSIS — F1120 Opioid dependence, uncomplicated: Secondary | ICD-10-CM | POA: Diagnosis not present

## 2020-08-13 DIAGNOSIS — Z1231 Encounter for screening mammogram for malignant neoplasm of breast: Secondary | ICD-10-CM | POA: Diagnosis not present

## 2020-08-13 DIAGNOSIS — M545 Low back pain, unspecified: Secondary | ICD-10-CM | POA: Diagnosis not present

## 2020-08-13 DIAGNOSIS — M48062 Spinal stenosis, lumbar region with neurogenic claudication: Secondary | ICD-10-CM | POA: Diagnosis not present

## 2020-08-21 DIAGNOSIS — M545 Low back pain, unspecified: Secondary | ICD-10-CM | POA: Diagnosis not present

## 2020-09-03 DIAGNOSIS — M7918 Myalgia, other site: Secondary | ICD-10-CM | POA: Diagnosis not present

## 2020-09-03 DIAGNOSIS — M5440 Lumbago with sciatica, unspecified side: Secondary | ICD-10-CM | POA: Diagnosis not present

## 2020-09-05 DIAGNOSIS — M5440 Lumbago with sciatica, unspecified side: Secondary | ICD-10-CM | POA: Diagnosis not present

## 2020-09-05 DIAGNOSIS — M7918 Myalgia, other site: Secondary | ICD-10-CM | POA: Diagnosis not present

## 2020-09-07 DIAGNOSIS — J45901 Unspecified asthma with (acute) exacerbation: Secondary | ICD-10-CM | POA: Diagnosis not present

## 2020-09-10 DIAGNOSIS — Z1159 Encounter for screening for other viral diseases: Secondary | ICD-10-CM | POA: Diagnosis not present

## 2020-09-10 DIAGNOSIS — E559 Vitamin D deficiency, unspecified: Secondary | ICD-10-CM | POA: Diagnosis not present

## 2020-09-10 DIAGNOSIS — Z79899 Other long term (current) drug therapy: Secondary | ICD-10-CM | POA: Diagnosis not present

## 2020-09-10 DIAGNOSIS — Z9181 History of falling: Secondary | ICD-10-CM | POA: Diagnosis not present

## 2020-09-10 DIAGNOSIS — C8590 Non-Hodgkin lymphoma, unspecified, unspecified site: Secondary | ICD-10-CM | POA: Diagnosis not present

## 2020-09-10 DIAGNOSIS — F1120 Opioid dependence, uncomplicated: Secondary | ICD-10-CM | POA: Diagnosis not present

## 2020-09-10 DIAGNOSIS — M47817 Spondylosis without myelopathy or radiculopathy, lumbosacral region: Secondary | ICD-10-CM | POA: Diagnosis not present

## 2020-09-10 DIAGNOSIS — M545 Low back pain, unspecified: Secondary | ICD-10-CM | POA: Diagnosis not present

## 2020-09-10 DIAGNOSIS — Z6823 Body mass index (BMI) 23.0-23.9, adult: Secondary | ICD-10-CM | POA: Diagnosis not present

## 2020-09-10 DIAGNOSIS — F17210 Nicotine dependence, cigarettes, uncomplicated: Secondary | ICD-10-CM | POA: Diagnosis not present

## 2020-09-13 DIAGNOSIS — M5440 Lumbago with sciatica, unspecified side: Secondary | ICD-10-CM | POA: Diagnosis not present

## 2020-09-13 DIAGNOSIS — M7918 Myalgia, other site: Secondary | ICD-10-CM | POA: Diagnosis not present

## 2020-09-24 DIAGNOSIS — M7918 Myalgia, other site: Secondary | ICD-10-CM | POA: Diagnosis not present

## 2020-09-24 DIAGNOSIS — M5440 Lumbago with sciatica, unspecified side: Secondary | ICD-10-CM | POA: Diagnosis not present

## 2020-09-26 DIAGNOSIS — M47817 Spondylosis without myelopathy or radiculopathy, lumbosacral region: Secondary | ICD-10-CM | POA: Diagnosis not present

## 2020-10-01 DIAGNOSIS — Z6825 Body mass index (BMI) 25.0-25.9, adult: Secondary | ICD-10-CM | POA: Diagnosis not present

## 2020-10-01 DIAGNOSIS — J441 Chronic obstructive pulmonary disease with (acute) exacerbation: Secondary | ICD-10-CM | POA: Diagnosis not present

## 2020-10-01 DIAGNOSIS — E119 Type 2 diabetes mellitus without complications: Secondary | ICD-10-CM | POA: Diagnosis not present

## 2020-10-01 DIAGNOSIS — R0602 Shortness of breath: Secondary | ICD-10-CM | POA: Diagnosis not present

## 2020-10-01 DIAGNOSIS — E876 Hypokalemia: Secondary | ICD-10-CM | POA: Diagnosis not present

## 2020-10-01 DIAGNOSIS — F419 Anxiety disorder, unspecified: Secondary | ICD-10-CM | POA: Diagnosis not present

## 2020-10-08 DIAGNOSIS — C8590 Non-Hodgkin lymphoma, unspecified, unspecified site: Secondary | ICD-10-CM | POA: Diagnosis not present

## 2020-10-08 DIAGNOSIS — E119 Type 2 diabetes mellitus without complications: Secondary | ICD-10-CM | POA: Diagnosis not present

## 2020-10-08 DIAGNOSIS — F17210 Nicotine dependence, cigarettes, uncomplicated: Secondary | ICD-10-CM | POA: Diagnosis not present

## 2020-10-08 DIAGNOSIS — J45901 Unspecified asthma with (acute) exacerbation: Secondary | ICD-10-CM | POA: Diagnosis not present

## 2020-10-08 DIAGNOSIS — Z79899 Other long term (current) drug therapy: Secondary | ICD-10-CM | POA: Diagnosis not present

## 2020-10-08 DIAGNOSIS — C8331 Diffuse large B-cell lymphoma, lymph nodes of head, face, and neck: Secondary | ICD-10-CM | POA: Diagnosis not present

## 2020-10-08 DIAGNOSIS — F1120 Opioid dependence, uncomplicated: Secondary | ICD-10-CM | POA: Diagnosis not present

## 2020-10-08 DIAGNOSIS — Z6823 Body mass index (BMI) 23.0-23.9, adult: Secondary | ICD-10-CM | POA: Diagnosis not present

## 2020-10-08 DIAGNOSIS — Z9181 History of falling: Secondary | ICD-10-CM | POA: Diagnosis not present

## 2020-10-10 DIAGNOSIS — M488X4 Other specified spondylopathies, thoracic region: Secondary | ICD-10-CM | POA: Diagnosis not present

## 2020-10-10 DIAGNOSIS — M6289 Other specified disorders of muscle: Secondary | ICD-10-CM | POA: Diagnosis not present

## 2020-10-10 DIAGNOSIS — C8331 Diffuse large B-cell lymphoma, lymph nodes of head, face, and neck: Secondary | ICD-10-CM | POA: Diagnosis not present

## 2020-10-10 DIAGNOSIS — F1120 Opioid dependence, uncomplicated: Secondary | ICD-10-CM | POA: Diagnosis not present

## 2020-10-10 DIAGNOSIS — D37030 Neoplasm of uncertain behavior of the parotid salivary glands: Secondary | ICD-10-CM | POA: Diagnosis not present

## 2020-10-10 DIAGNOSIS — R590 Localized enlarged lymph nodes: Secondary | ICD-10-CM | POA: Diagnosis not present

## 2020-10-17 DIAGNOSIS — E119 Type 2 diabetes mellitus without complications: Secondary | ICD-10-CM | POA: Diagnosis not present

## 2020-10-17 DIAGNOSIS — Z90710 Acquired absence of both cervix and uterus: Secondary | ICD-10-CM | POA: Diagnosis not present

## 2020-10-17 DIAGNOSIS — F329 Major depressive disorder, single episode, unspecified: Secondary | ICD-10-CM | POA: Diagnosis not present

## 2020-10-17 DIAGNOSIS — G510 Bell's palsy: Secondary | ICD-10-CM | POA: Diagnosis not present

## 2020-10-17 DIAGNOSIS — C8331 Diffuse large B-cell lymphoma, lymph nodes of head, face, and neck: Secondary | ICD-10-CM | POA: Diagnosis not present

## 2020-10-17 DIAGNOSIS — G9529 Other cord compression: Secondary | ICD-10-CM | POA: Diagnosis not present

## 2020-10-17 DIAGNOSIS — Z961 Presence of intraocular lens: Secondary | ICD-10-CM | POA: Diagnosis not present

## 2020-10-17 DIAGNOSIS — F419 Anxiety disorder, unspecified: Secondary | ICD-10-CM | POA: Diagnosis not present

## 2020-10-17 DIAGNOSIS — J449 Chronic obstructive pulmonary disease, unspecified: Secondary | ICD-10-CM | POA: Diagnosis not present

## 2020-10-17 DIAGNOSIS — H532 Diplopia: Secondary | ICD-10-CM | POA: Diagnosis not present

## 2020-10-17 DIAGNOSIS — D37030 Neoplasm of uncertain behavior of the parotid salivary glands: Secondary | ICD-10-CM | POA: Diagnosis not present

## 2020-10-17 DIAGNOSIS — K219 Gastro-esophageal reflux disease without esophagitis: Secondary | ICD-10-CM | POA: Diagnosis not present

## 2020-10-17 DIAGNOSIS — I10 Essential (primary) hypertension: Secondary | ICD-10-CM | POA: Diagnosis not present

## 2020-10-17 DIAGNOSIS — C7951 Secondary malignant neoplasm of bone: Secondary | ICD-10-CM | POA: Diagnosis not present

## 2020-10-17 DIAGNOSIS — Z510 Encounter for antineoplastic radiation therapy: Secondary | ICD-10-CM | POA: Diagnosis not present

## 2020-10-17 DIAGNOSIS — M4804 Spinal stenosis, thoracic region: Secondary | ICD-10-CM | POA: Diagnosis not present

## 2020-10-17 DIAGNOSIS — R220 Localized swelling, mass and lump, head: Secondary | ICD-10-CM | POA: Diagnosis not present

## 2020-10-17 DIAGNOSIS — F17210 Nicotine dependence, cigarettes, uncomplicated: Secondary | ICD-10-CM | POA: Diagnosis not present

## 2020-10-17 DIAGNOSIS — Z9484 Stem cells transplant status: Secondary | ICD-10-CM | POA: Diagnosis not present

## 2020-10-17 DIAGNOSIS — I898 Other specified noninfective disorders of lymphatic vessels and lymph nodes: Secondary | ICD-10-CM | POA: Diagnosis not present

## 2020-10-17 DIAGNOSIS — C8330 Diffuse large B-cell lymphoma, unspecified site: Secondary | ICD-10-CM | POA: Diagnosis not present

## 2020-10-17 DIAGNOSIS — H02206 Unspecified lagophthalmos left eye, unspecified eyelid: Secondary | ICD-10-CM | POA: Diagnosis not present

## 2020-10-18 DIAGNOSIS — D37030 Neoplasm of uncertain behavior of the parotid salivary glands: Secondary | ICD-10-CM | POA: Insufficient documentation

## 2020-10-18 DIAGNOSIS — C8331 Diffuse large B-cell lymphoma, lymph nodes of head, face, and neck: Secondary | ICD-10-CM | POA: Diagnosis not present

## 2020-10-19 DIAGNOSIS — C8331 Diffuse large B-cell lymphoma, lymph nodes of head, face, and neck: Secondary | ICD-10-CM | POA: Diagnosis not present

## 2020-10-19 DIAGNOSIS — Z510 Encounter for antineoplastic radiation therapy: Secondary | ICD-10-CM | POA: Diagnosis not present

## 2020-10-19 DIAGNOSIS — C8330 Diffuse large B-cell lymphoma, unspecified site: Secondary | ICD-10-CM | POA: Diagnosis not present

## 2020-10-22 DIAGNOSIS — C8331 Diffuse large B-cell lymphoma, lymph nodes of head, face, and neck: Secondary | ICD-10-CM | POA: Diagnosis not present

## 2020-10-22 DIAGNOSIS — C8330 Diffuse large B-cell lymphoma, unspecified site: Secondary | ICD-10-CM | POA: Diagnosis not present

## 2020-10-22 DIAGNOSIS — Z510 Encounter for antineoplastic radiation therapy: Secondary | ICD-10-CM | POA: Diagnosis not present

## 2020-10-26 DIAGNOSIS — C8331 Diffuse large B-cell lymphoma, lymph nodes of head, face, and neck: Secondary | ICD-10-CM | POA: Diagnosis not present

## 2020-10-26 DIAGNOSIS — C8330 Diffuse large B-cell lymphoma, unspecified site: Secondary | ICD-10-CM | POA: Diagnosis not present

## 2020-10-26 DIAGNOSIS — Z510 Encounter for antineoplastic radiation therapy: Secondary | ICD-10-CM | POA: Diagnosis not present

## 2020-10-29 DIAGNOSIS — Z510 Encounter for antineoplastic radiation therapy: Secondary | ICD-10-CM | POA: Diagnosis not present

## 2020-10-29 DIAGNOSIS — C8330 Diffuse large B-cell lymphoma, unspecified site: Secondary | ICD-10-CM | POA: Diagnosis not present

## 2020-10-29 DIAGNOSIS — C8331 Diffuse large B-cell lymphoma, lymph nodes of head, face, and neck: Secondary | ICD-10-CM | POA: Diagnosis not present

## 2020-10-31 DIAGNOSIS — E559 Vitamin D deficiency, unspecified: Secondary | ICD-10-CM | POA: Diagnosis not present

## 2020-10-31 DIAGNOSIS — Z79899 Other long term (current) drug therapy: Secondary | ICD-10-CM | POA: Diagnosis not present

## 2020-10-31 DIAGNOSIS — F17210 Nicotine dependence, cigarettes, uncomplicated: Secondary | ICD-10-CM | POA: Diagnosis not present

## 2020-10-31 DIAGNOSIS — E119 Type 2 diabetes mellitus without complications: Secondary | ICD-10-CM | POA: Diagnosis not present

## 2020-10-31 DIAGNOSIS — Z6820 Body mass index (BMI) 20.0-20.9, adult: Secondary | ICD-10-CM | POA: Diagnosis not present

## 2020-10-31 DIAGNOSIS — Z9181 History of falling: Secondary | ICD-10-CM | POA: Diagnosis not present

## 2020-10-31 DIAGNOSIS — C8590 Non-Hodgkin lymphoma, unspecified, unspecified site: Secondary | ICD-10-CM | POA: Diagnosis not present

## 2020-11-02 DIAGNOSIS — F1120 Opioid dependence, uncomplicated: Secondary | ICD-10-CM | POA: Diagnosis not present

## 2020-11-05 DIAGNOSIS — Z7984 Long term (current) use of oral hypoglycemic drugs: Secondary | ICD-10-CM | POA: Diagnosis not present

## 2020-11-05 DIAGNOSIS — R7881 Bacteremia: Secondary | ICD-10-CM | POA: Diagnosis not present

## 2020-11-05 DIAGNOSIS — C8330 Diffuse large B-cell lymphoma, unspecified site: Secondary | ICD-10-CM | POA: Diagnosis not present

## 2020-11-05 DIAGNOSIS — J441 Chronic obstructive pulmonary disease with (acute) exacerbation: Secondary | ICD-10-CM | POA: Diagnosis not present

## 2020-11-05 DIAGNOSIS — R401 Stupor: Secondary | ICD-10-CM | POA: Diagnosis not present

## 2020-11-05 DIAGNOSIS — Z833 Family history of diabetes mellitus: Secondary | ICD-10-CM | POA: Diagnosis not present

## 2020-11-05 DIAGNOSIS — R7401 Elevation of levels of liver transaminase levels: Secondary | ICD-10-CM | POA: Diagnosis not present

## 2020-11-05 DIAGNOSIS — Z9484 Stem cells transplant status: Secondary | ICD-10-CM | POA: Diagnosis not present

## 2020-11-05 DIAGNOSIS — G8929 Other chronic pain: Secondary | ICD-10-CM | POA: Diagnosis not present

## 2020-11-05 DIAGNOSIS — E119 Type 2 diabetes mellitus without complications: Secondary | ICD-10-CM | POA: Diagnosis not present

## 2020-11-05 DIAGNOSIS — G9520 Unspecified cord compression: Secondary | ICD-10-CM | POA: Diagnosis not present

## 2020-11-05 DIAGNOSIS — F17210 Nicotine dependence, cigarettes, uncomplicated: Secondary | ICD-10-CM | POA: Diagnosis not present

## 2020-11-05 DIAGNOSIS — J440 Chronic obstructive pulmonary disease with acute lower respiratory infection: Secondary | ICD-10-CM | POA: Diagnosis not present

## 2020-11-05 DIAGNOSIS — D696 Thrombocytopenia, unspecified: Secondary | ICD-10-CM | POA: Diagnosis not present

## 2020-11-05 DIAGNOSIS — G9340 Encephalopathy, unspecified: Secondary | ICD-10-CM | POA: Diagnosis not present

## 2020-11-05 DIAGNOSIS — U071 COVID-19: Secondary | ICD-10-CM | POA: Diagnosis not present

## 2020-11-05 DIAGNOSIS — N390 Urinary tract infection, site not specified: Secondary | ICD-10-CM | POA: Diagnosis not present

## 2020-11-05 DIAGNOSIS — R6520 Severe sepsis without septic shock: Secondary | ICD-10-CM | POA: Diagnosis not present

## 2020-11-05 DIAGNOSIS — A4189 Other specified sepsis: Secondary | ICD-10-CM | POA: Diagnosis not present

## 2020-11-05 DIAGNOSIS — J45901 Unspecified asthma with (acute) exacerbation: Secondary | ICD-10-CM | POA: Diagnosis not present

## 2020-11-05 DIAGNOSIS — B957 Other staphylococcus as the cause of diseases classified elsewhere: Secondary | ICD-10-CM | POA: Diagnosis not present

## 2020-11-05 DIAGNOSIS — R4182 Altered mental status, unspecified: Secondary | ICD-10-CM | POA: Diagnosis not present

## 2020-11-05 DIAGNOSIS — Z79899 Other long term (current) drug therapy: Secondary | ICD-10-CM | POA: Diagnosis not present

## 2020-11-05 DIAGNOSIS — R0902 Hypoxemia: Secondary | ICD-10-CM | POA: Diagnosis not present

## 2020-11-05 DIAGNOSIS — Z801 Family history of malignant neoplasm of trachea, bronchus and lung: Secondary | ICD-10-CM | POA: Diagnosis not present

## 2020-11-05 DIAGNOSIS — Z791 Long term (current) use of non-steroidal anti-inflammatories (NSAID): Secondary | ICD-10-CM | POA: Diagnosis not present

## 2020-11-05 DIAGNOSIS — R8271 Bacteriuria: Secondary | ICD-10-CM | POA: Diagnosis not present

## 2020-11-05 DIAGNOSIS — E876 Hypokalemia: Secondary | ICD-10-CM | POA: Diagnosis not present

## 2020-11-05 DIAGNOSIS — B9620 Unspecified Escherichia coli [E. coli] as the cause of diseases classified elsewhere: Secondary | ICD-10-CM | POA: Diagnosis not present

## 2020-11-05 DIAGNOSIS — Z8051 Family history of malignant neoplasm of kidney: Secondary | ICD-10-CM | POA: Diagnosis not present

## 2020-11-05 DIAGNOSIS — C8333 Diffuse large B-cell lymphoma, intra-abdominal lymph nodes: Secondary | ICD-10-CM | POA: Diagnosis not present

## 2020-11-05 DIAGNOSIS — E1165 Type 2 diabetes mellitus with hyperglycemia: Secondary | ICD-10-CM | POA: Diagnosis not present

## 2020-11-05 DIAGNOSIS — J1282 Pneumonia due to coronavirus disease 2019: Secondary | ICD-10-CM | POA: Diagnosis not present

## 2020-11-05 DIAGNOSIS — D693 Immune thrombocytopenic purpura: Secondary | ICD-10-CM | POA: Diagnosis not present

## 2020-11-05 DIAGNOSIS — J9601 Acute respiratory failure with hypoxia: Secondary | ICD-10-CM | POA: Diagnosis not present

## 2020-11-05 DIAGNOSIS — R9431 Abnormal electrocardiogram [ECG] [EKG]: Secondary | ICD-10-CM | POA: Diagnosis not present

## 2020-11-05 DIAGNOSIS — G9341 Metabolic encephalopathy: Secondary | ICD-10-CM | POA: Diagnosis not present

## 2020-11-15 ENCOUNTER — Observation Stay: Payer: PPO

## 2020-11-15 ENCOUNTER — Inpatient Hospital Stay
Admission: EM | Admit: 2020-11-15 | Discharge: 2020-11-18 | DRG: 871 | Disposition: A | Discharge status: Home / Self Care | Payer: PPO | Attending: Internal Medicine | Admitting: Internal Medicine

## 2020-11-15 ENCOUNTER — Emergency Department: Payer: PPO

## 2020-11-15 ENCOUNTER — Other Ambulatory Visit: Payer: Self-pay

## 2020-11-15 DIAGNOSIS — G9340 Encephalopathy, unspecified: Secondary | ICD-10-CM | POA: Diagnosis not present

## 2020-11-15 DIAGNOSIS — Z91048 Other nonmedicinal substance allergy status: Secondary | ICD-10-CM

## 2020-11-15 DIAGNOSIS — R509 Fever, unspecified: Secondary | ICD-10-CM

## 2020-11-15 DIAGNOSIS — C8330 Diffuse large B-cell lymphoma, unspecified site: Secondary | ICD-10-CM | POA: Diagnosis present

## 2020-11-15 DIAGNOSIS — Z9484 Stem cells transplant status: Secondary | ICD-10-CM | POA: Diagnosis not present

## 2020-11-15 DIAGNOSIS — F17210 Nicotine dependence, cigarettes, uncomplicated: Secondary | ICD-10-CM | POA: Diagnosis present

## 2020-11-15 DIAGNOSIS — M5030 Other cervical disc degeneration, unspecified cervical region: Secondary | ICD-10-CM | POA: Diagnosis not present

## 2020-11-15 DIAGNOSIS — E119 Type 2 diabetes mellitus without complications: Secondary | ICD-10-CM | POA: Diagnosis present

## 2020-11-15 DIAGNOSIS — Z91040 Latex allergy status: Secondary | ICD-10-CM | POA: Diagnosis not present

## 2020-11-15 DIAGNOSIS — J439 Emphysema, unspecified: Secondary | ICD-10-CM | POA: Diagnosis present

## 2020-11-15 DIAGNOSIS — E782 Mixed hyperlipidemia: Secondary | ICD-10-CM

## 2020-11-15 DIAGNOSIS — Z7984 Long term (current) use of oral hypoglycemic drugs: Secondary | ICD-10-CM | POA: Diagnosis not present

## 2020-11-15 DIAGNOSIS — C7951 Secondary malignant neoplasm of bone: Secondary | ICD-10-CM | POA: Diagnosis present

## 2020-11-15 DIAGNOSIS — F419 Anxiety disorder, unspecified: Secondary | ICD-10-CM | POA: Diagnosis present

## 2020-11-15 DIAGNOSIS — Z886 Allergy status to analgesic agent status: Secondary | ICD-10-CM | POA: Diagnosis not present

## 2020-11-15 DIAGNOSIS — Z7952 Long term (current) use of systemic steroids: Secondary | ICD-10-CM

## 2020-11-15 DIAGNOSIS — D490 Neoplasm of unspecified behavior of digestive system: Secondary | ICD-10-CM | POA: Diagnosis present

## 2020-11-15 DIAGNOSIS — A419 Sepsis, unspecified organism: Secondary | ICD-10-CM | POA: Diagnosis present

## 2020-11-15 DIAGNOSIS — N390 Urinary tract infection, site not specified: Secondary | ICD-10-CM | POA: Diagnosis present

## 2020-11-15 DIAGNOSIS — Z79899 Other long term (current) drug therapy: Secondary | ICD-10-CM

## 2020-11-15 DIAGNOSIS — R0902 Hypoxemia: Secondary | ICD-10-CM | POA: Diagnosis not present

## 2020-11-15 DIAGNOSIS — J189 Pneumonia, unspecified organism: Secondary | ICD-10-CM | POA: Diagnosis present

## 2020-11-15 DIAGNOSIS — F17200 Nicotine dependence, unspecified, uncomplicated: Secondary | ICD-10-CM | POA: Diagnosis present

## 2020-11-15 DIAGNOSIS — F32A Depression, unspecified: Secondary | ICD-10-CM | POA: Diagnosis present

## 2020-11-15 DIAGNOSIS — E785 Hyperlipidemia, unspecified: Secondary | ICD-10-CM | POA: Diagnosis present

## 2020-11-15 DIAGNOSIS — I700 Atherosclerosis of aorta: Secondary | ICD-10-CM | POA: Diagnosis not present

## 2020-11-15 DIAGNOSIS — I10 Essential (primary) hypertension: Secondary | ICD-10-CM | POA: Diagnosis present

## 2020-11-15 DIAGNOSIS — R4182 Altered mental status, unspecified: Secondary | ICD-10-CM | POA: Diagnosis present

## 2020-11-15 DIAGNOSIS — U071 COVID-19: Secondary | ICD-10-CM | POA: Diagnosis present

## 2020-11-15 DIAGNOSIS — R404 Transient alteration of awareness: Secondary | ICD-10-CM | POA: Diagnosis not present

## 2020-11-15 DIAGNOSIS — G9341 Metabolic encephalopathy: Secondary | ICD-10-CM | POA: Diagnosis present

## 2020-11-15 DIAGNOSIS — R000 Tachycardia, unspecified: Secondary | ICD-10-CM | POA: Diagnosis not present

## 2020-11-15 DIAGNOSIS — C8331 Diffuse large B-cell lymphoma, lymph nodes of head, face, and neck: Secondary | ICD-10-CM | POA: Diagnosis not present

## 2020-11-15 DIAGNOSIS — Z833 Family history of diabetes mellitus: Secondary | ICD-10-CM | POA: Diagnosis not present

## 2020-11-15 DIAGNOSIS — M5136 Other intervertebral disc degeneration, lumbar region: Secondary | ICD-10-CM | POA: Diagnosis not present

## 2020-11-15 DIAGNOSIS — C8299 Follicular lymphoma, unspecified, extranodal and solid organ sites: Secondary | ICD-10-CM | POA: Diagnosis not present

## 2020-11-15 DIAGNOSIS — Z90710 Acquired absence of both cervix and uterus: Secondary | ICD-10-CM | POA: Diagnosis not present

## 2020-11-15 DIAGNOSIS — R4020 Unspecified coma: Secondary | ICD-10-CM | POA: Diagnosis not present

## 2020-11-15 DIAGNOSIS — R55 Syncope and collapse: Secondary | ICD-10-CM | POA: Diagnosis not present

## 2020-11-15 DIAGNOSIS — C8590 Non-Hodgkin lymphoma, unspecified, unspecified site: Secondary | ICD-10-CM | POA: Diagnosis present

## 2020-11-15 DIAGNOSIS — G319 Degenerative disease of nervous system, unspecified: Secondary | ICD-10-CM | POA: Diagnosis not present

## 2020-11-15 LAB — CBC WITH DIFFERENTIAL/PLATELET
Abs Immature Granulocytes: 0.16 10*3/uL — ABNORMAL HIGH (ref 0.00–0.07)
Basophils Absolute: 0 10*3/uL (ref 0.0–0.1)
Basophils Relative: 0 %
Eosinophils Absolute: 0 10*3/uL (ref 0.0–0.5)
Eosinophils Relative: 0 %
HCT: 41.4 % (ref 36.0–46.0)
Hemoglobin: 13.6 g/dL (ref 12.0–15.0)
Immature Granulocytes: 2 %
Lymphocytes Relative: 14 %
Lymphs Abs: 1.2 10*3/uL (ref 0.7–4.0)
MCH: 31.6 pg (ref 26.0–34.0)
MCHC: 32.9 g/dL (ref 30.0–36.0)
MCV: 96.1 fL (ref 80.0–100.0)
Monocytes Absolute: 0.6 10*3/uL (ref 0.1–1.0)
Monocytes Relative: 7 %
Neutro Abs: 7 10*3/uL (ref 1.7–7.7)
Neutrophils Relative %: 77 %
Platelets: 140 10*3/uL — ABNORMAL LOW (ref 150–400)
RBC: 4.31 MIL/uL (ref 3.87–5.11)
RDW: 14.4 % (ref 11.5–15.5)
WBC: 9.1 10*3/uL (ref 4.0–10.5)
nRBC: 0.2 % (ref 0.0–0.2)

## 2020-11-15 LAB — BLOOD GAS, VENOUS
Acid-Base Excess: 7.1 mmol/L — ABNORMAL HIGH (ref 0.0–2.0)
Bicarbonate: 32.6 mmol/L — ABNORMAL HIGH (ref 20.0–28.0)
O2 Saturation: 64.6 %
Patient temperature: 37
pCO2, Ven: 48 mmHg (ref 44.0–60.0)
pH, Ven: 7.44 — ABNORMAL HIGH (ref 7.250–7.430)
pO2, Ven: 32 mmHg (ref 32.0–45.0)

## 2020-11-15 LAB — URINE DRUG SCREEN, QUALITATIVE (ARMC ONLY)
Amphetamines, Ur Screen: NOT DETECTED
Barbiturates, Ur Screen: NOT DETECTED
Benzodiazepine, Ur Scrn: NOT DETECTED
Cannabinoid 50 Ng, Ur ~~LOC~~: NOT DETECTED
Cocaine Metabolite,Ur ~~LOC~~: NOT DETECTED
MDMA (Ecstasy)Ur Screen: NOT DETECTED
Methadone Scn, Ur: NOT DETECTED
Opiate, Ur Screen: NOT DETECTED
Phencyclidine (PCP) Ur S: NOT DETECTED
Tricyclic, Ur Screen: NOT DETECTED

## 2020-11-15 LAB — PROTIME-INR
INR: 1.1 (ref 0.8–1.2)
Prothrombin Time: 13.8 seconds (ref 11.4–15.2)

## 2020-11-15 LAB — URINALYSIS, COMPLETE (UACMP) WITH MICROSCOPIC
Bacteria, UA: NONE SEEN
Bilirubin Urine: NEGATIVE
Glucose, UA: >=500 mg/dL — AB
Ketones, ur: NEGATIVE mg/dL
Leukocytes,Ua: NEGATIVE
Nitrite: NEGATIVE
Protein, ur: 30 mg/dL — AB
Specific Gravity, Urine: 1.024 (ref 1.005–1.030)
pH: 5 (ref 5.0–8.0)

## 2020-11-15 LAB — COMPREHENSIVE METABOLIC PANEL
ALT: 23 U/L (ref 0–44)
AST: 17 U/L (ref 15–41)
Albumin: 3.5 g/dL (ref 3.5–5.0)
Alkaline Phosphatase: 81 U/L (ref 38–126)
Anion gap: 13 (ref 5–15)
BUN: 18 mg/dL (ref 8–23)
CO2: 27 mmol/L (ref 22–32)
Calcium: 9 mg/dL (ref 8.9–10.3)
Chloride: 97 mmol/L — ABNORMAL LOW (ref 98–111)
Creatinine, Ser: 0.91 mg/dL (ref 0.44–1.00)
GFR, Estimated: >60 mL/min (ref >60)
Glucose, Bld: 170 mg/dL — ABNORMAL HIGH (ref 70–99)
Potassium: 4 mmol/L (ref 3.5–5.1)
Sodium: 137 mmol/L (ref 135–145)
Total Bilirubin: 0.9 mg/dL (ref 0.3–1.2)
Total Protein: 7.3 g/dL (ref 6.5–8.1)

## 2020-11-15 LAB — AMMONIA: Ammonia: 13 umol/L (ref 9–35)

## 2020-11-15 LAB — RESP PANEL BY RT-PCR (FLU A&B, COVID) ARPGX2
Influenza A by PCR: NEGATIVE
Influenza B by PCR: NEGATIVE
SARS Coronavirus 2 by RT PCR: POSITIVE — AB

## 2020-11-15 LAB — LIPASE, BLOOD: Lipase: 44 U/L (ref 11–51)

## 2020-11-15 LAB — APTT: aPTT: 20 seconds — ABNORMAL LOW (ref 24–36)

## 2020-11-15 LAB — LACTIC ACID, PLASMA
Lactic Acid, Venous: 1.9 mmol/L (ref 0.5–1.9)
Lactic Acid, Venous: 1.4 mmol/L (ref 0.5–1.9)

## 2020-11-15 LAB — PROCALCITONIN: Procalcitonin: <0.1 ng/mL

## 2020-11-15 LAB — CK: Total CK: 32 U/L — ABNORMAL LOW (ref 38–234)

## 2020-11-15 LAB — ETHANOL: Alcohol, Ethyl (B): <10 mg/dL (ref <10)

## 2020-11-15 IMAGING — CT CT HEAD W/O CM
3 series · 15 of 47 positions shown, 18 images · non-contrast
Comparison: [DATE]

CLINICAL DATA: Found unresponsive.

EXAM:
CT HEAD WITHOUT CONTRAST
TECHNIQUE: Contiguous axial images were obtained from the base of the skull
through the vertex without intravenous contrast.

[Series 3: head wo · axial · 0.45mm/px · z∈[-95,+30]mm · 9 of 30 slices shown, 12 images]
[im 3/30  brain]
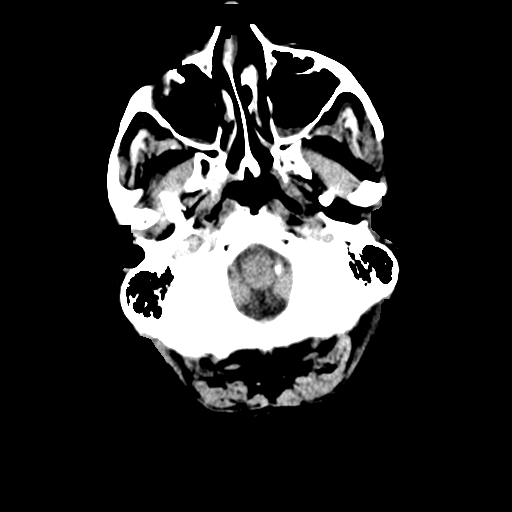
[im 3/30  bone]
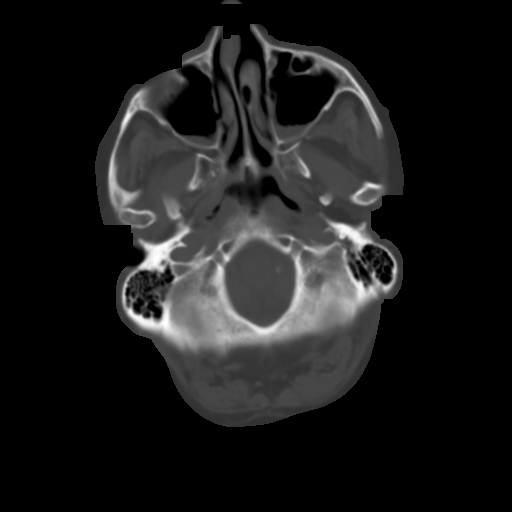
[im 6/30  brain]
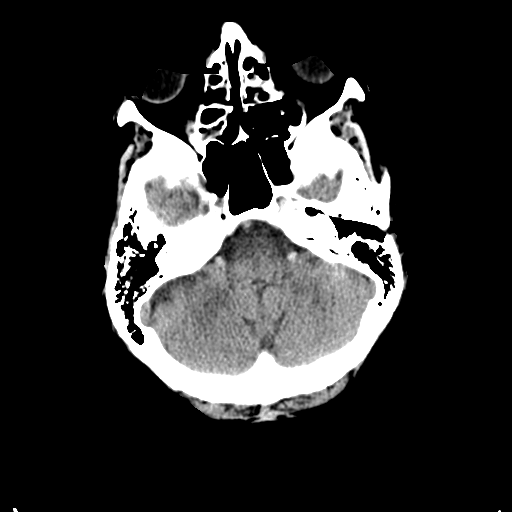
[im 9/30  brain]
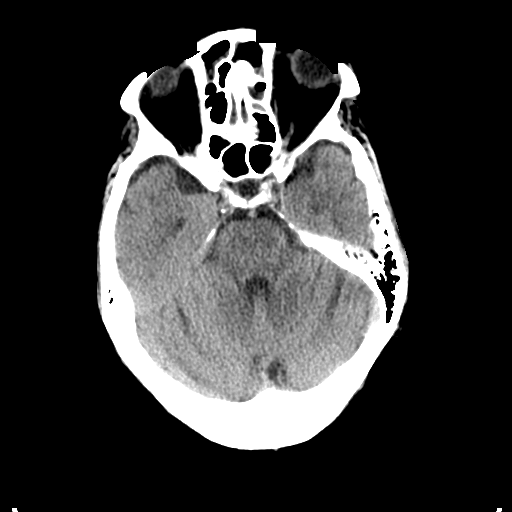
[im 12/30  brain]
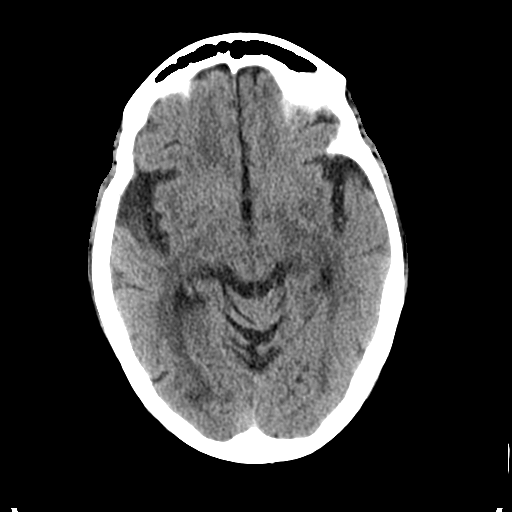
[im 16/30  brain]
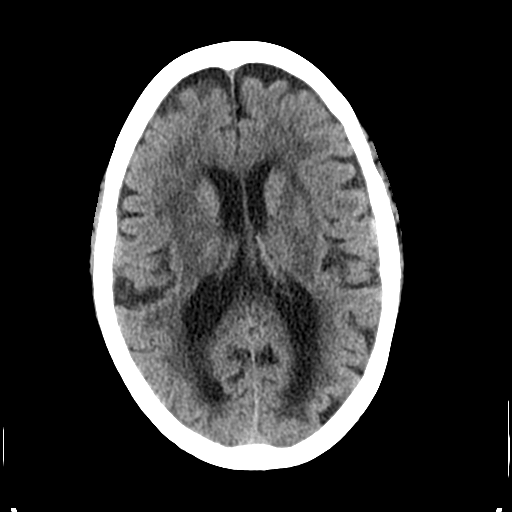
[im 16/30  bone]
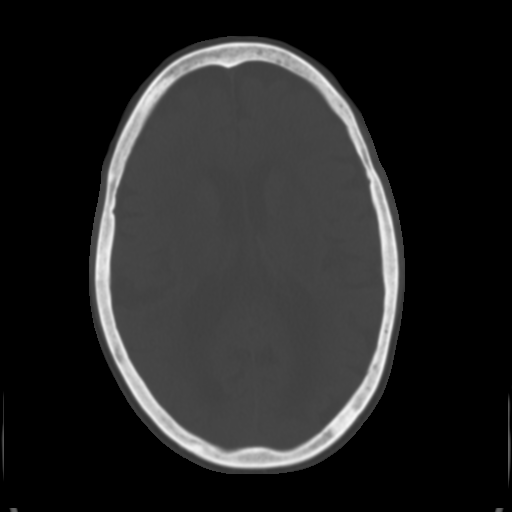
[im 19/30  brain]
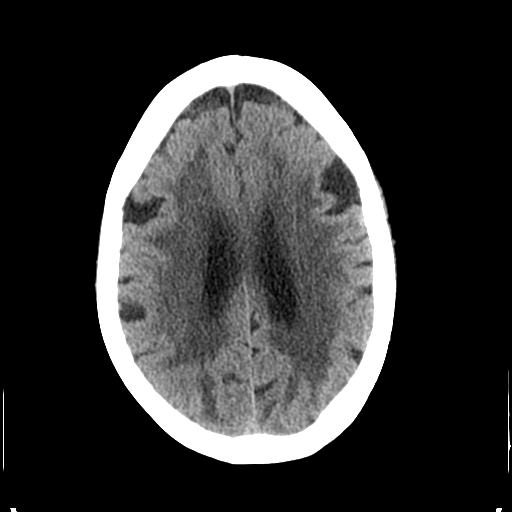
[im 22/30  brain]
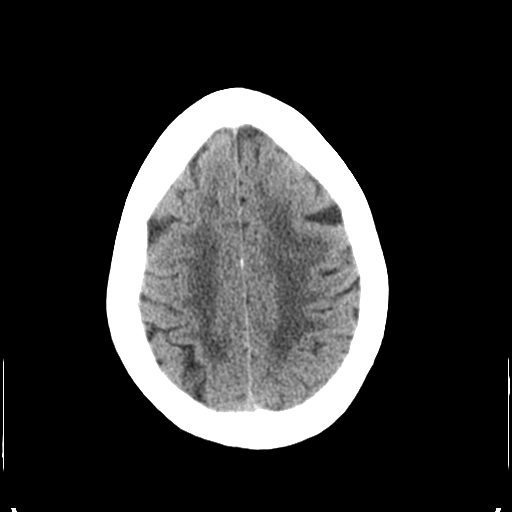
[im 25/30  brain]
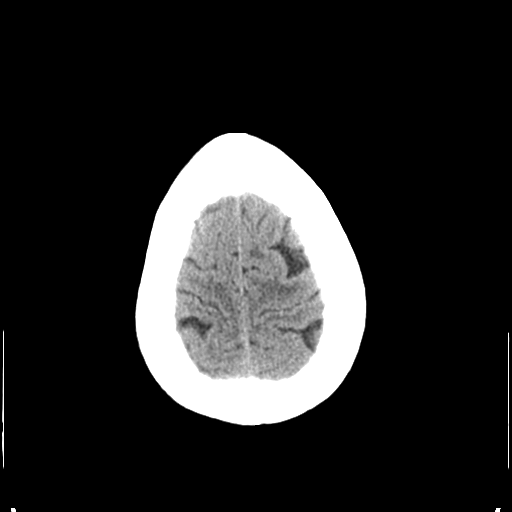
[im 28/30  brain]
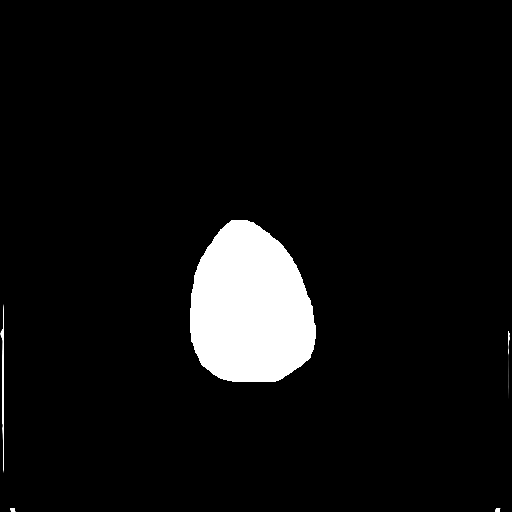
[im 28/30  bone]
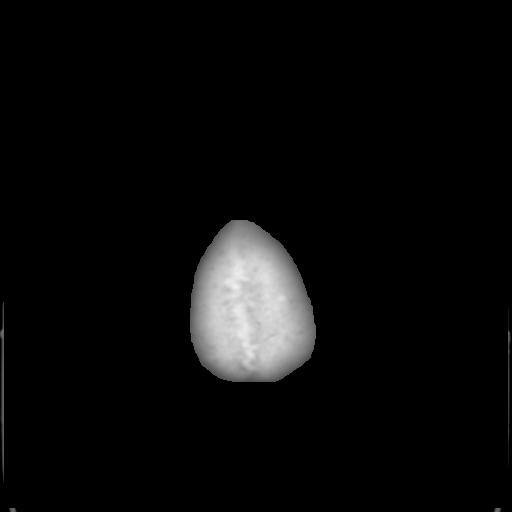

[Series 4: coronal soft tissue · coronal · 0.29mm/px · 3 of 66 slices shown]
[im 22/66  brain]
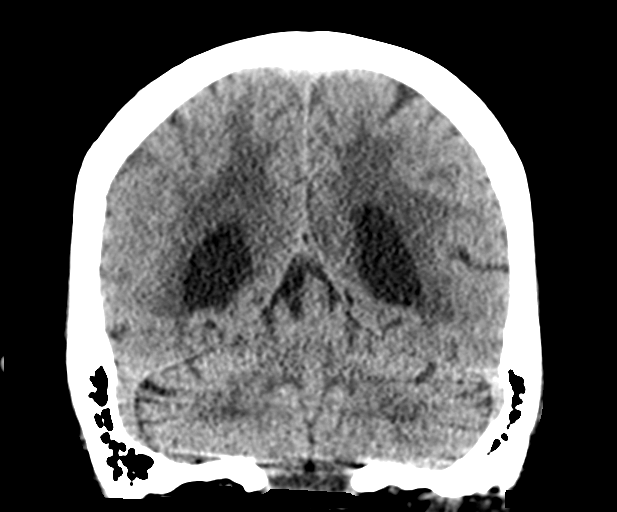
[im 29/66  brain]
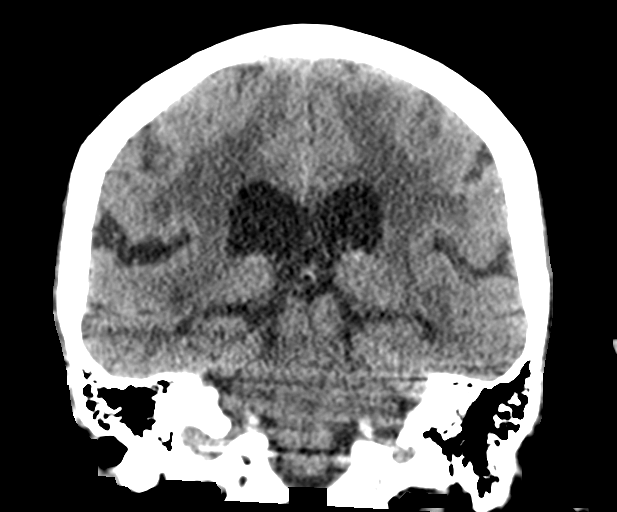
[im 37/66  brain]
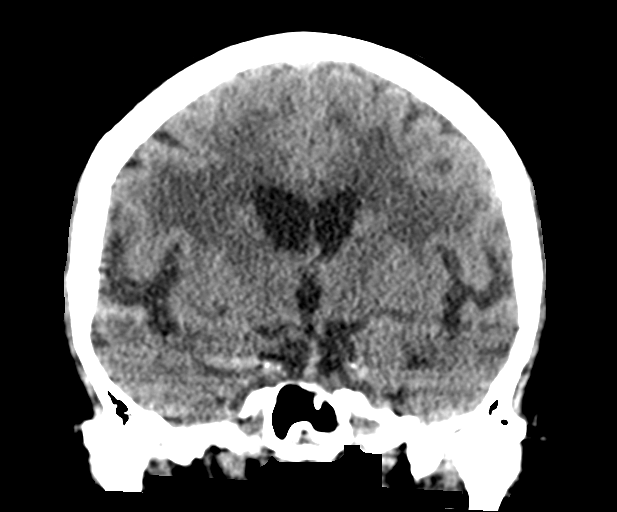

[Series 5: sagittal soft tissue · sagittal · 0.32mm/px · 3 of 52 slices shown]
[im 18/52  brain]
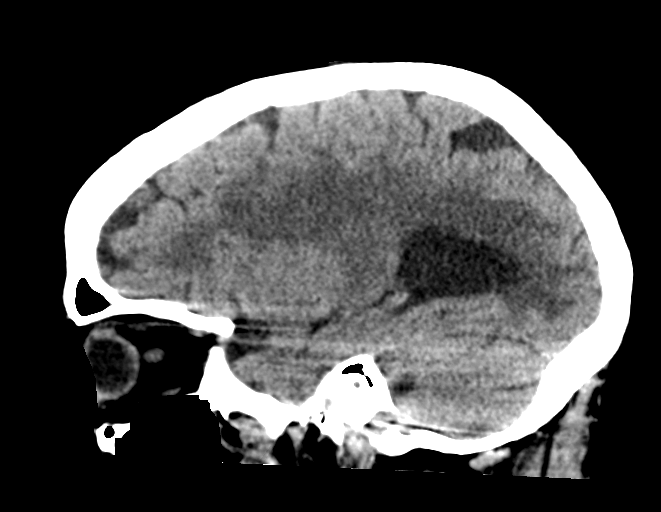
[im 26/52  brain]
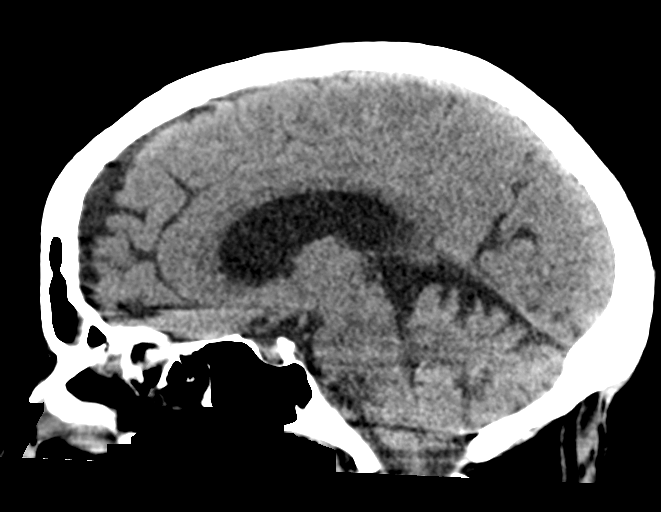
[im 35/52  brain]
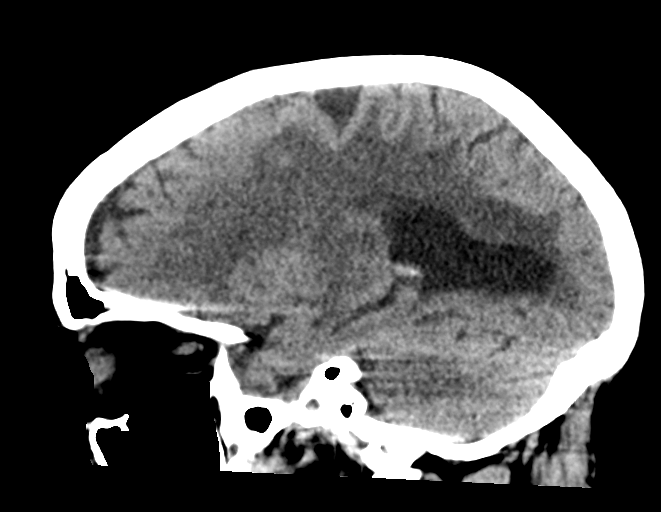

[15 of 47 positions shown; findings below may reference images not displayed]

FINDINGS: Brain: There is mild to moderate severity cerebral atrophy with
widening of the extra-axial spaces and ventricular dilatation.
There are areas of decreased attenuation within the white matter
tracts of the supratentorial brain, consistent with microvascular
disease changes.

Vascular: No hyperdense vessel or unexpected calcification.

Skull: Normal. Negative for fracture or focal lesion.

Sinuses/Orbits: Mild frontal sinus, mild bilateral maxillary sinus
and moderate severity bilateral ethmoid sinus mucosal thickening is
seen.

Other: None.
IMPRESSION: 1. Generalized cerebral atrophy.
2. No acute intracranial abnormality.
3. Paranasal sinus disease.

## 2020-11-15 IMAGING — MR MR THORACIC SPINE WO/W CM
7 of 9 series · 28 of 48 positions shown · IV contrast (6ml Gadavist)
Comparison: None.

CLINICAL DATA: Found unresponsive

EXAM:
MRI CERVICAL, THORACIC AND LUMBAR SPINE WITHOUT AND WITH CONTRAST
TECHNIQUE: Multiplanar and multiecho pulse sequences of the cervical spine, to
include the craniocervical junction and cervicothoracic junction,
and thoracic and lumbar spine, were obtained without and with
intravenous contrast.
CONTRAST:  6mL GADAVIST GADOBUTROL 1 MMOL/ML IV SOLN

[Series 20: T1 · sagittal · 5.0mm · 1.88mm/px · 1 of 9 slices shown (1 of 2)]
[im 1/9]
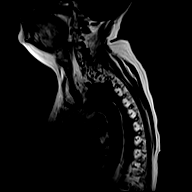

[Series 21: T2 · sagittal · 3.0mm · 1.06mm/px · 3 of 17 slices shown (1 of 2)]
[im 1/17]
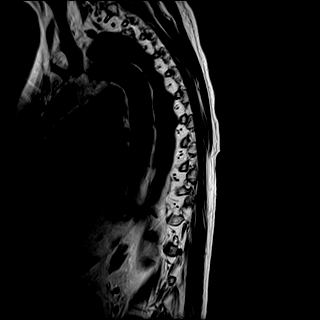
[im 9/17]
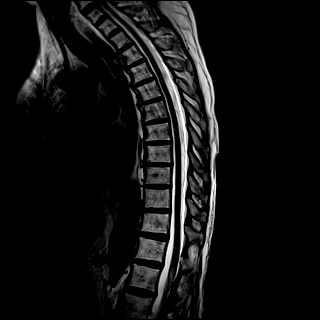
[im 17/17]
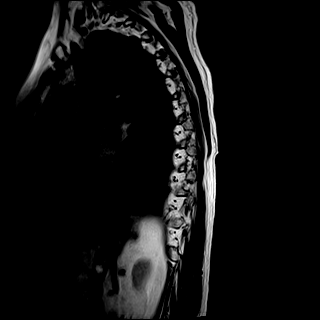

[Series 22: T1 · sagittal · 3.0mm · 1.06mm/px · 4 of 17 slices shown (2 of 2)]
[im 1/17]
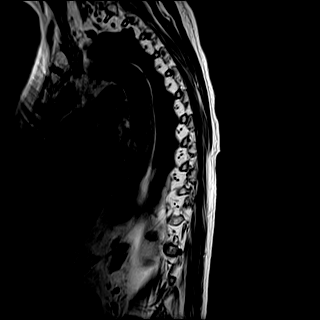
[im 6/17]
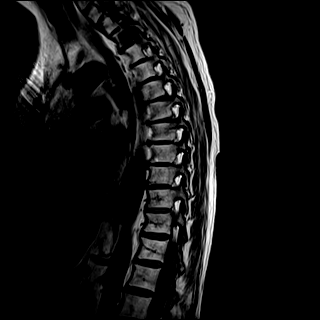
[im 11/17]
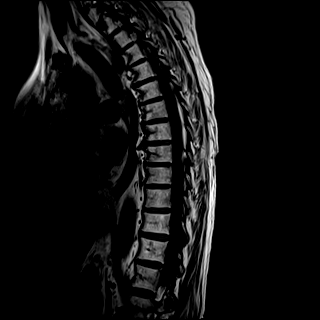
[im 17/17]
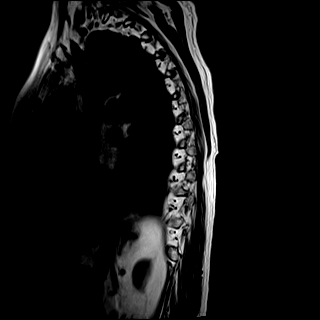

[Series 23: STIR · sagittal · 3.0mm · 0.53mm/px · 4 of 17 slices shown]
[im 1/17]
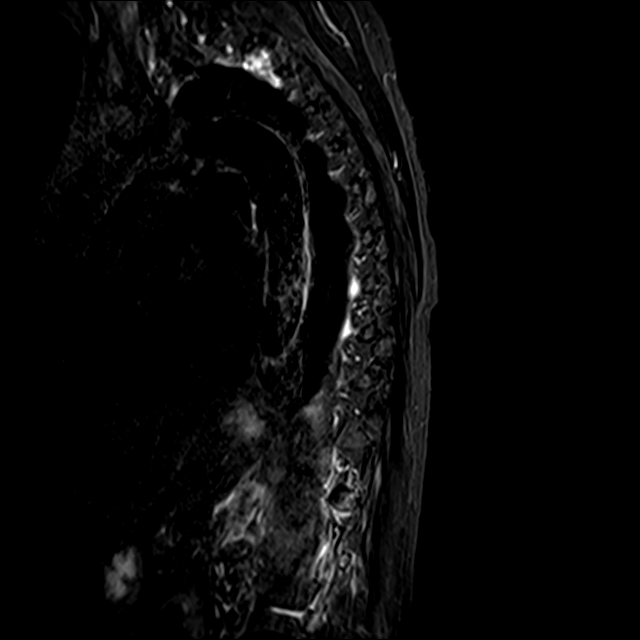
[im 6/17]
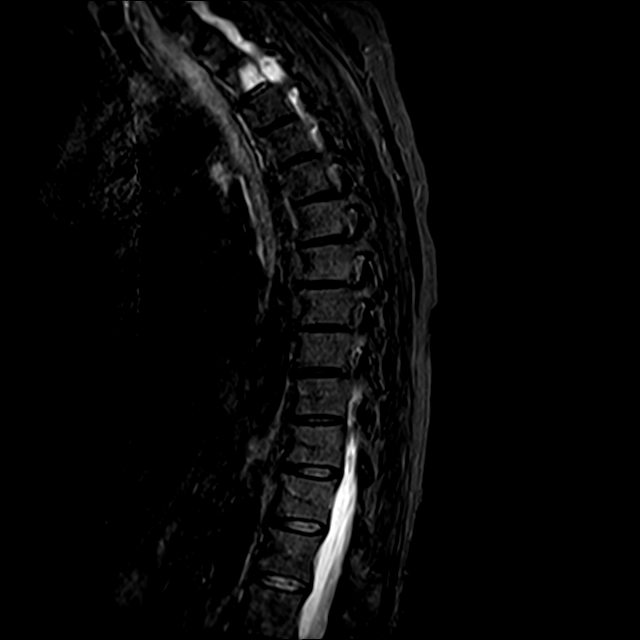
[im 11/17]
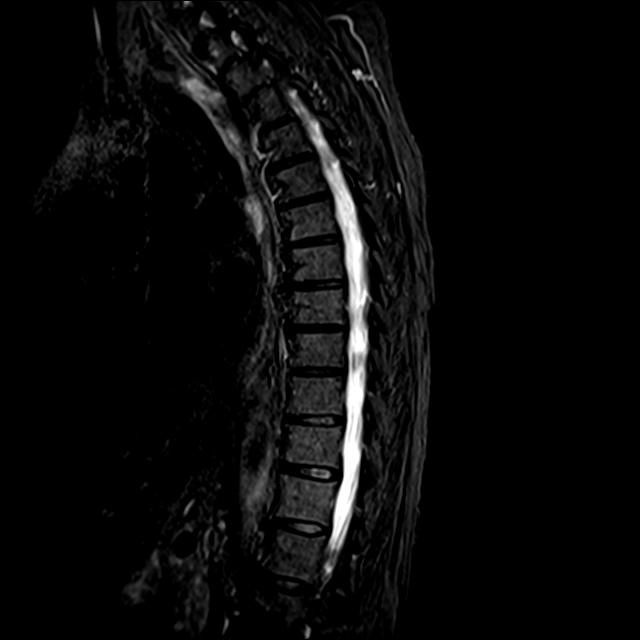
[im 17/17]
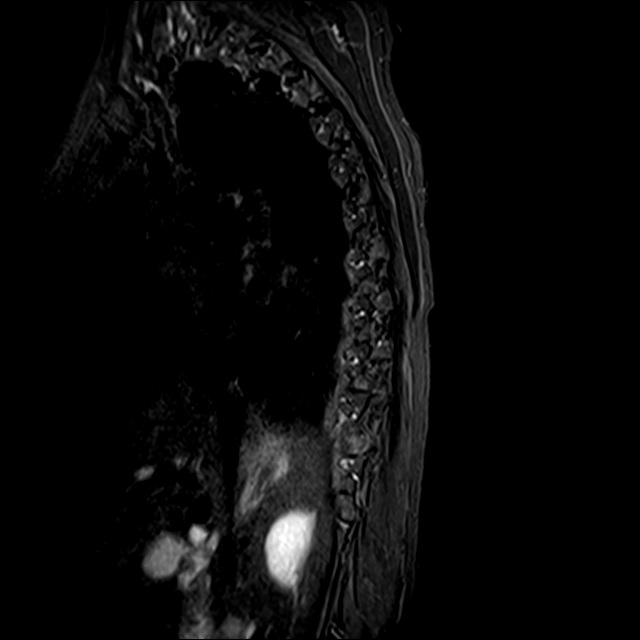

[Series 24: T2 · axial · 4.0mm · 0.59mm/px · z∈[-466,-260]mm · 8 of 39 slices shown (2 of 2)]
[im 1/39]
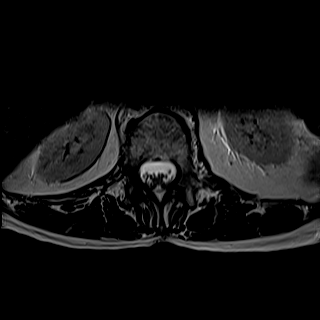
[im 6/39]
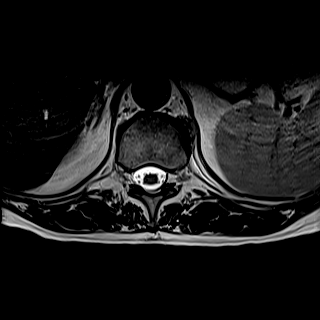
[im 11/39]
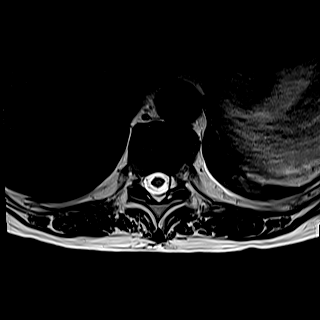
[im 17/39]
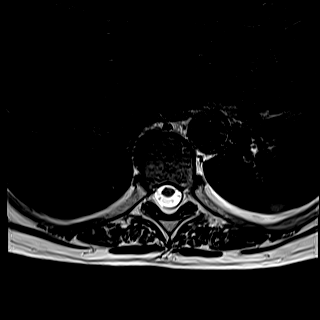
[im 22/39]
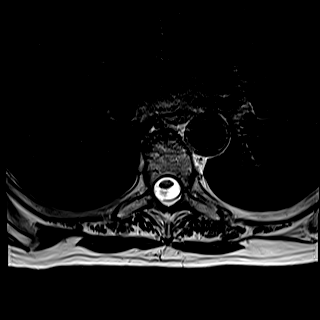
[im 28/39]
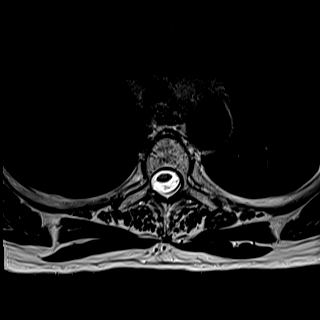
[im 33/39]
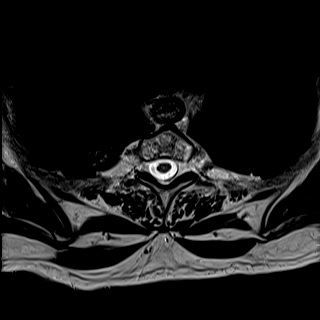
[im 39/39]
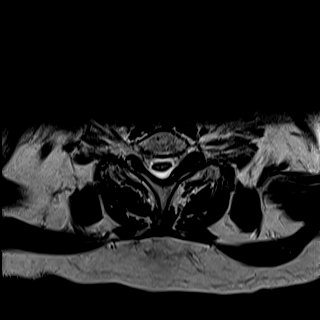

[Series 26: T1 post-contrast · axial · non-contrast · 4.0mm · 0.37mm/px · z∈[-466,-352]mm · 4 of 39 slices shown]
[im 1/39]
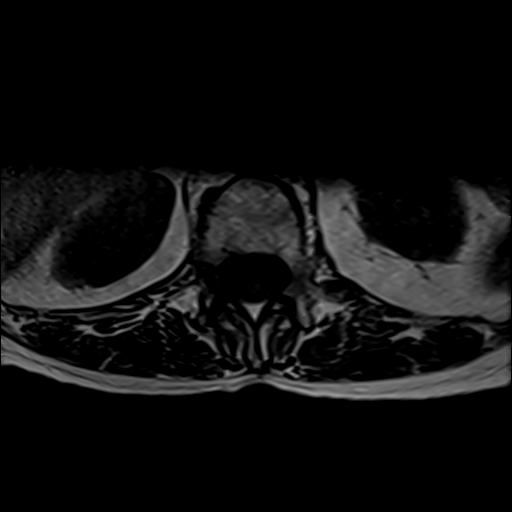
[im 6/39]
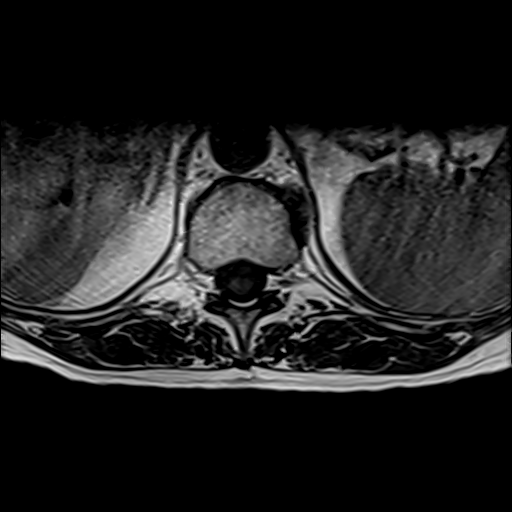
[im 11/39]
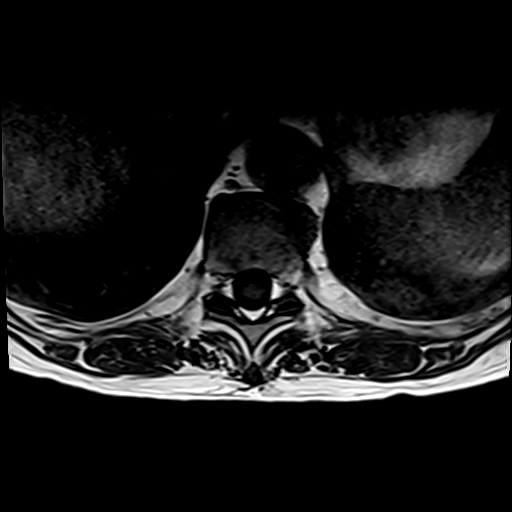
[im 17/39]
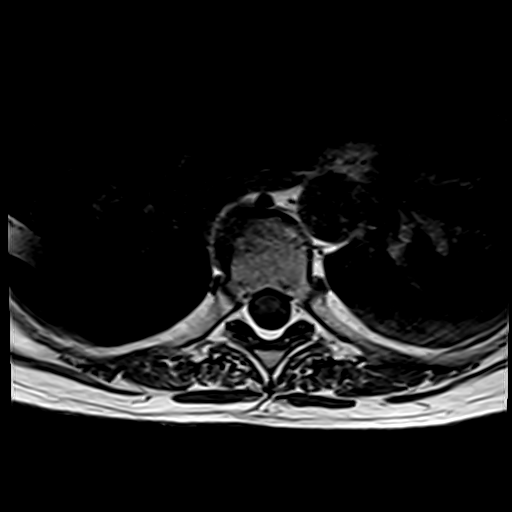

[Series 28: T1 fat-sat post-contrast · sagittal · 3.0mm · 1.06mm/px · 4 of 17 slices shown]
[im 1/17]
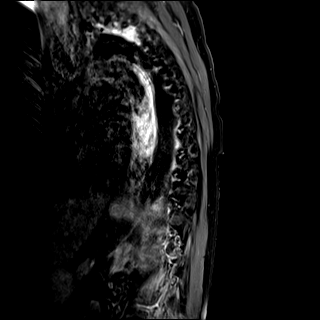
[im 6/17]
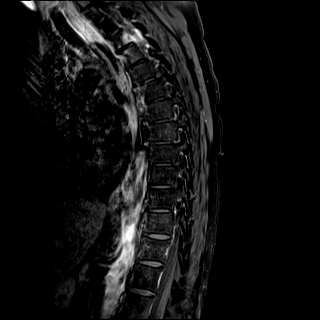
[im 11/17]
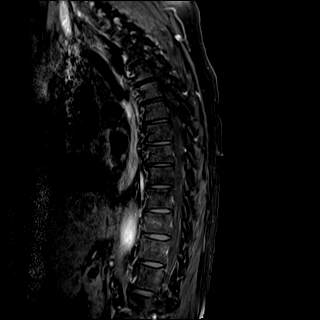
[im 17/17]
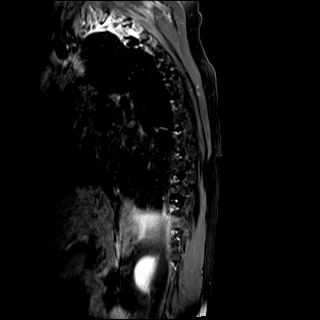

[28 of 48 positions shown; findings below may reference images not displayed]

FINDINGS: MRI CERVICAL SPINE FINDINGS

Alignment: Physiologic.

Vertebrae: No fracture, evidence of discitis, or bone lesion.

Cord: Normal signal and morphology.

Posterior Fossa, vertebral arteries, paraspinal tissues: Negative.

Disc levels:

C2-3: Unremarkable.

C3-4: Small central disc protrusion without stenosis.

C4-5: Left facet hypertrophy.  No stenosis.

C5-6: Small right subarticular disc protrusion indenting the ventral
spinal cord. No spinal canal stenosis.

C6-7: Small disc bulge narrowing the ventral thecal sac. Mild spinal
canal stenosis.

C7-T1: Unremarkable

MRI THORACIC SPINE FINDINGS

Alignment:  Physiologic.

Vertebrae: There is a paraspinal mass on the left at T2-3 that also
involves the left half of T2 vertebral body and the left pedicle.
The lesion is diffusely contrast-enhancing. Otherwise, the thoracic
vertebral bodies are normal.

Cord:  Normal signal and morphology.  No epidural collection.

Paraspinal and other soft tissues: Biapical pulmonary scarring. Left
basilar opacity.

Disc levels:

No spinal canal or neural foraminal stenosis.

MRI LUMBAR SPINE FINDINGS

Segmentation:  Standard

Alignment:  Grade 1 anterolisthesis at L4-5

Vertebrae:  No fracture, evidence of discitis, or bone lesion.

Conus medullaris and cauda equina: Conus extends to the L1 level.
Conus and cauda equina appear normal.

Paraspinal and other soft tissues: Negative

Disc levels:

L1-2: Normal.

L2-3: Small disc bulge without stenosis.

L3-4: Small disc bulge and mild facet hypertrophy without stenosis.

L4-5: Small disc bulge and moderate facet hypertrophy with mild
spinal canal stenosis and mild left foraminal stenosis.

L5-S1: Left asymmetric disc bulge and mild facet hypertrophy without
stenosis.
IMPRESSION: 1. No acute abnormality of the cervical, thoracic or lumbar spine.
2. Left paraspinal mass at T2-3 with involvement of the left
pedicle. This could indicate extra medullary hematopoiesis, a nerve
sheath tumor or a metastasis from an unknown primary. The etiology
is unclear and PET/CT may be helpful in further assessing the
probability of metastatic disease.
3. Mild cervical and lumbar degenerative disc disease with mild
spinal canal stenosis at C6-7.

## 2020-11-15 IMAGING — DX DG CHEST 1V PORT
1 series · 1 of 1 positions shown · non-contrast
Comparison: [DATE]

CLINICAL DATA: Fever, unresponsive

EXAM:
PORTABLE CHEST 1 VIEW

[chest ap]
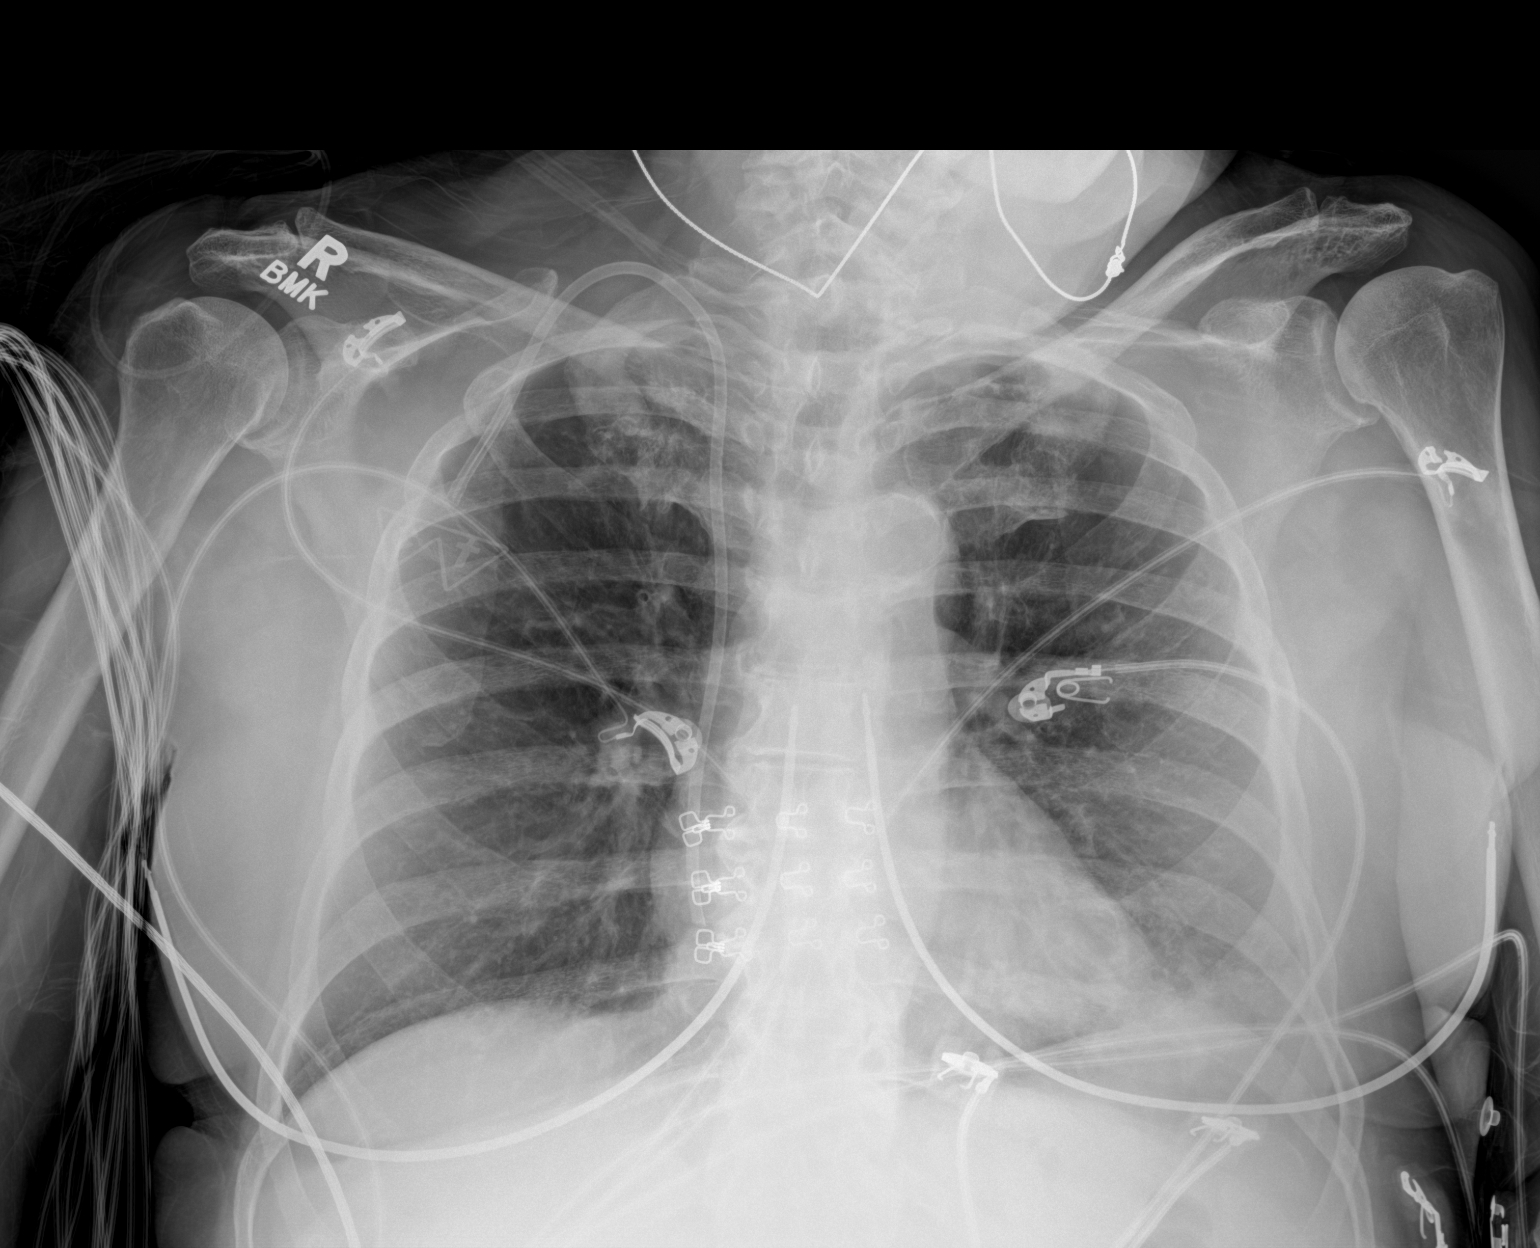

[1 of 1 positions shown; findings below may reference images not displayed]

FINDINGS: Mild left lower lobe opacity, likely atelectasis, less likely
pneumonia. Right lung is clear. No pleural effusion or pneumothorax.

Heart is normal in size.  Thoracic aortic atherosclerosis.

Right chest power port terminates in the upper right atrium.
IMPRESSION: Mild left lower lobe opacity, likely atelectasis, less likely
pneumonia.

## 2020-11-15 IMAGING — MR MR LUMBAR SPINE WO/W CM
6 of 7 series · 33 of 48 positions shown · IV contrast (6ml Gadavist)
Comparison: None.

CLINICAL DATA: Found unresponsive

EXAM:
MRI CERVICAL, THORACIC AND LUMBAR SPINE WITHOUT AND WITH CONTRAST
TECHNIQUE: Multiplanar and multiecho pulse sequences of the cervical spine, to
include the craniocervical junction and cervicothoracic junction,
and thoracic and lumbar spine, were obtained without and with
intravenous contrast.
CONTRAST:  6mL GADAVIST GADOBUTROL 1 MMOL/ML IV SOLN

[Series 9: T2 · sagittal · 4.0mm · 1.02mm/px · 5 of 15 slices shown (1 of 2)]
[im 1/15]
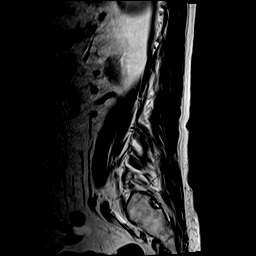
[im 4/15]
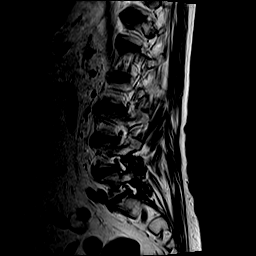
[im 8/15]
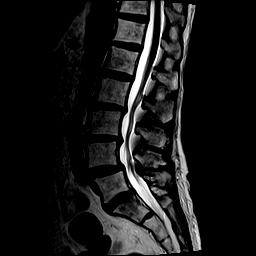
[im 11/15]
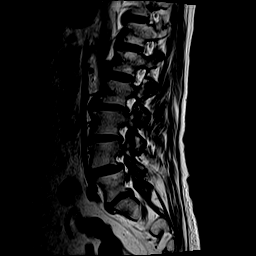
[im 15/15]
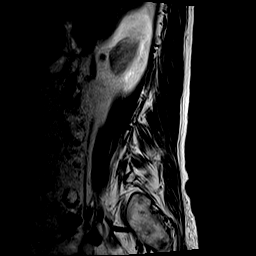

[Series 10: T1 · sagittal · 4.0mm · 1.02mm/px · 5 of 15 slices shown (1 of 2)]
[im 1/15]
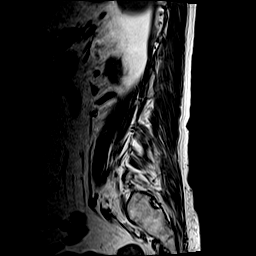
[im 4/15]
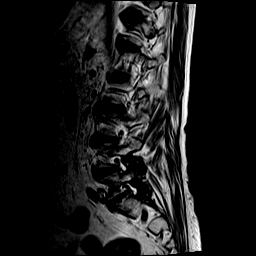
[im 8/15]
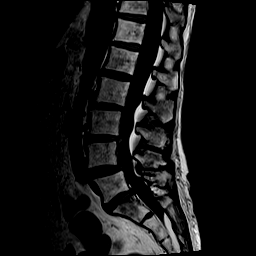
[im 11/15]
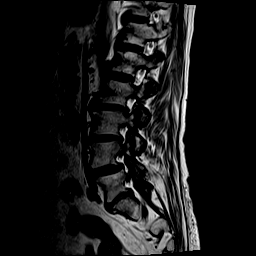
[im 15/15]
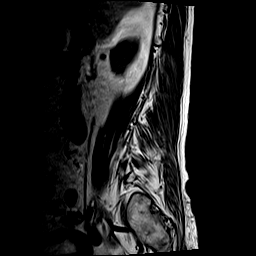

[Series 11: STIR · sagittal · 4.0mm · 0.51mm/px · 3 of 15 slices shown]
[im 1/15]
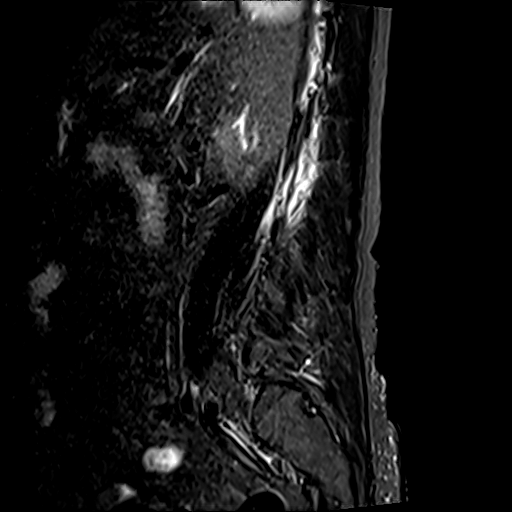
[im 5/15]
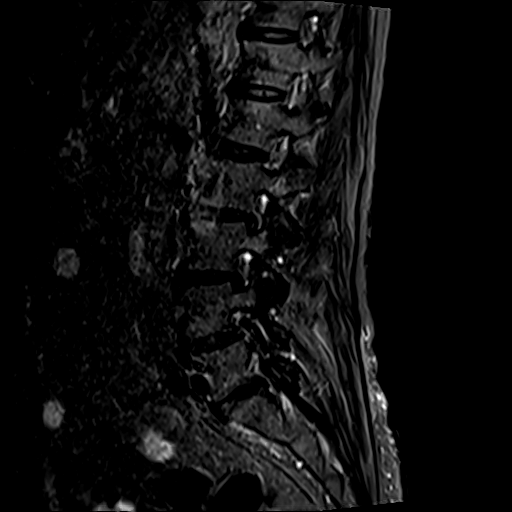
[im 10/15]
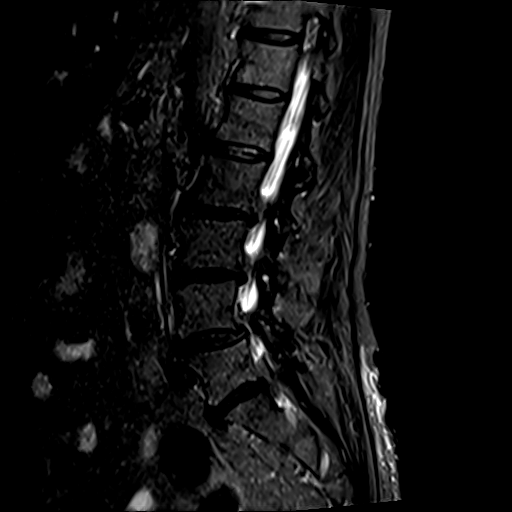

[Series 12: T2 · axial · 4.0mm · 0.78mm/px · z∈[-663,-450]mm · 8 of 36 slices shown (2 of 2)]
[im 1/36]
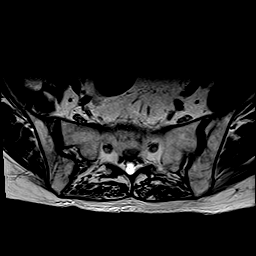
[im 4/36]
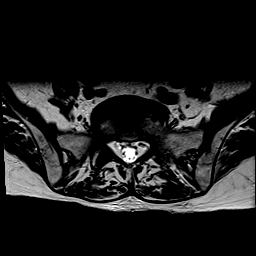
[im 12/36]
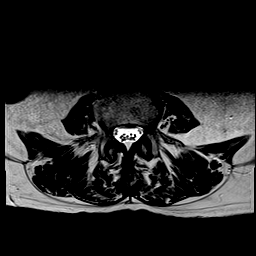
[im 16/36]
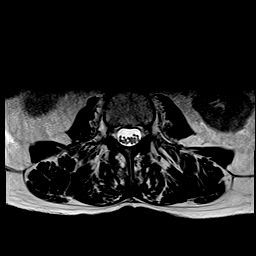
[im 20/36]
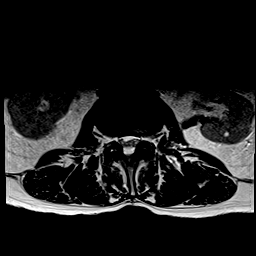
[im 24/36]
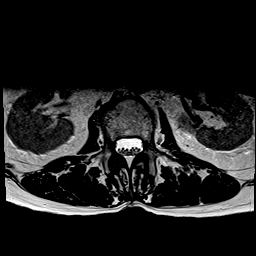
[im 32/36]
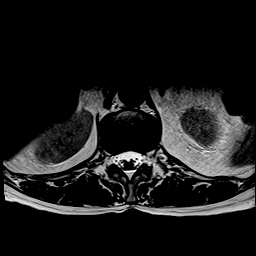
[im 36/36]
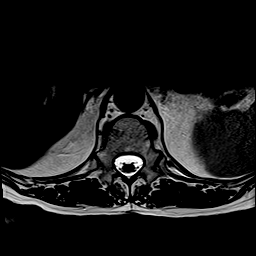

[Series 13: T1 · axial · 4.0mm · 0.39mm/px · z∈[-663,-450]mm · 8 of 36 slices shown (2 of 2)]
[im 1/36]
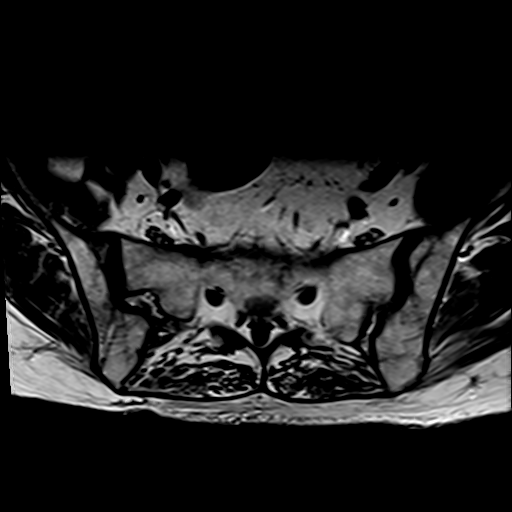
[im 4/36]
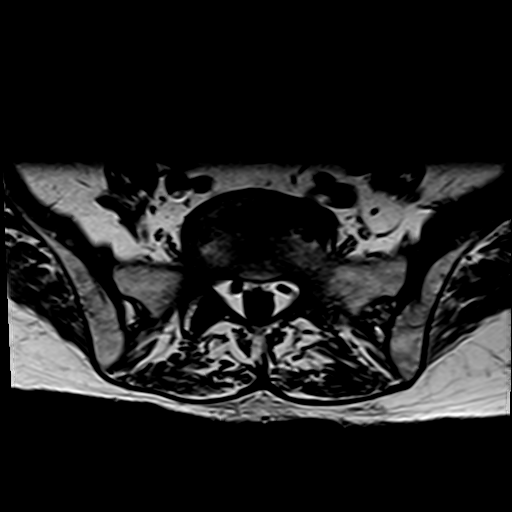
[im 12/36]
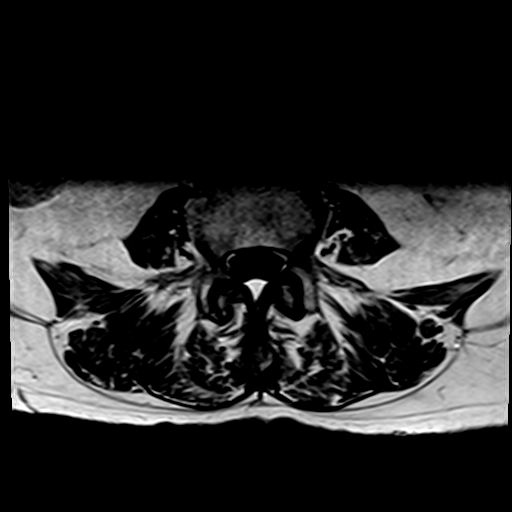
[im 16/36]
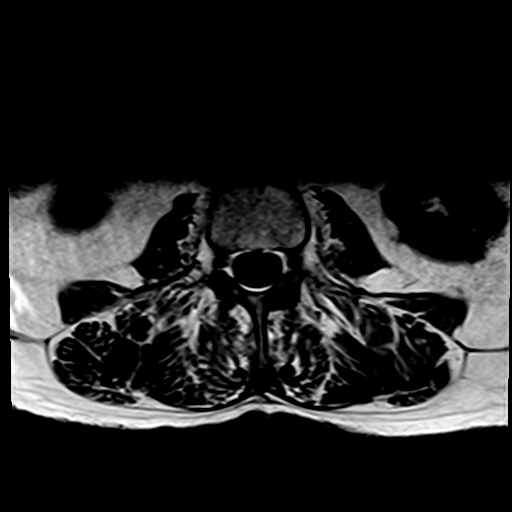
[im 20/36]
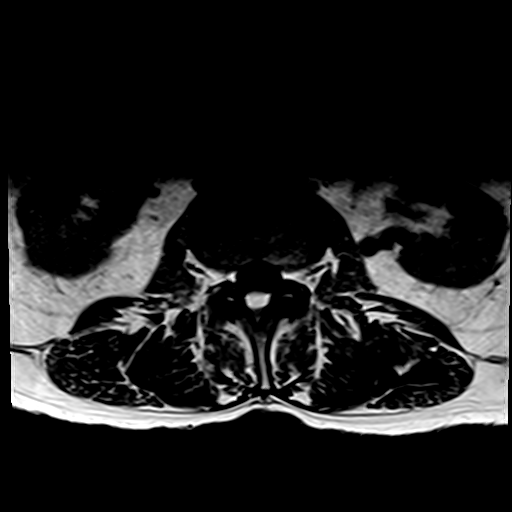
[im 24/36]
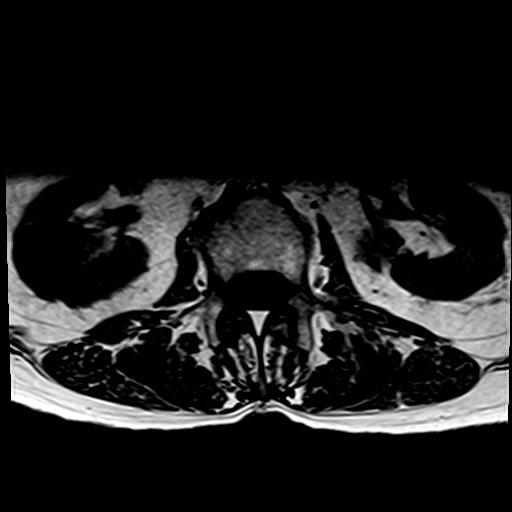
[im 32/36]
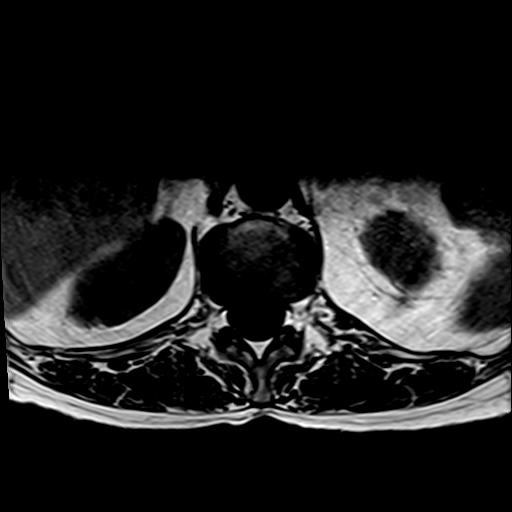
[im 36/36]
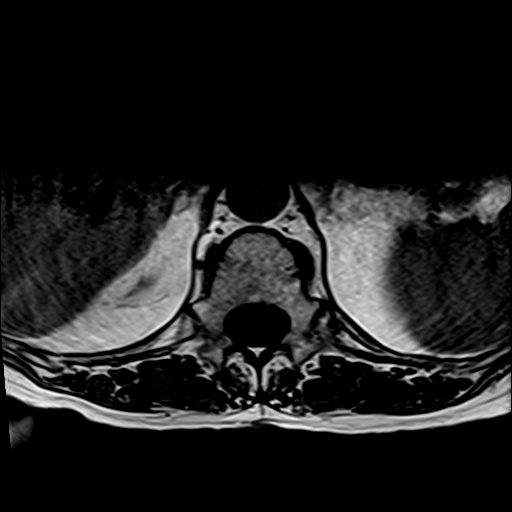

[Series 14: T1 fat-sat post-contrast · sagittal · 4.0mm · 1.02mm/px · 4 of 15 slices shown]
[im 1/15]
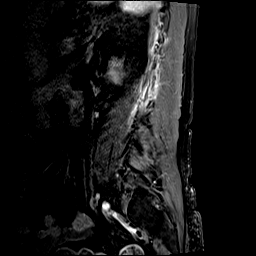
[im 5/15]
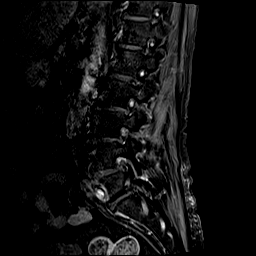
[im 10/15]
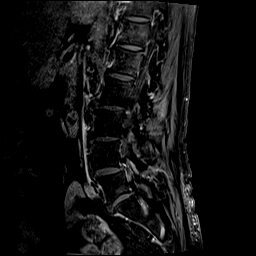
[im 15/15]
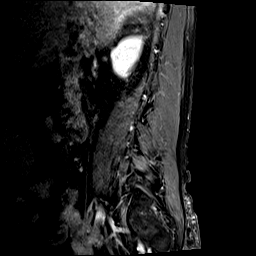

[33 of 48 positions shown; findings below may reference images not displayed]

FINDINGS: MRI CERVICAL SPINE FINDINGS

Alignment: Physiologic.

Vertebrae: No fracture, evidence of discitis, or bone lesion.

Cord: Normal signal and morphology.

Posterior Fossa, vertebral arteries, paraspinal tissues: Negative.

Disc levels:

C2-3: Unremarkable.

C3-4: Small central disc protrusion without stenosis.

C4-5: Left facet hypertrophy.  No stenosis.

C5-6: Small right subarticular disc protrusion indenting the ventral
spinal cord. No spinal canal stenosis.

C6-7: Small disc bulge narrowing the ventral thecal sac. Mild spinal
canal stenosis.

C7-T1: Unremarkable

MRI THORACIC SPINE FINDINGS

Alignment:  Physiologic.

Vertebrae: There is a paraspinal mass on the left at T2-3 that also
involves the left half of T2 vertebral body and the left pedicle.
The lesion is diffusely contrast-enhancing. Otherwise, the thoracic
vertebral bodies are normal.

Cord:  Normal signal and morphology.  No epidural collection.

Paraspinal and other soft tissues: Biapical pulmonary scarring. Left
basilar opacity.

Disc levels:

No spinal canal or neural foraminal stenosis.

MRI LUMBAR SPINE FINDINGS

Segmentation:  Standard

Alignment:  Grade 1 anterolisthesis at L4-5

Vertebrae:  No fracture, evidence of discitis, or bone lesion.

Conus medullaris and cauda equina: Conus extends to the L1 level.
Conus and cauda equina appear normal.

Paraspinal and other soft tissues: Negative

Disc levels:

L1-2: Normal.

L2-3: Small disc bulge without stenosis.

L3-4: Small disc bulge and mild facet hypertrophy without stenosis.

L4-5: Small disc bulge and moderate facet hypertrophy with mild
spinal canal stenosis and mild left foraminal stenosis.

L5-S1: Left asymmetric disc bulge and mild facet hypertrophy without
stenosis.
IMPRESSION: 1. No acute abnormality of the cervical, thoracic or lumbar spine.
2. Left paraspinal mass at T2-3 with involvement of the left
pedicle. This could indicate extra medullary hematopoiesis, a nerve
sheath tumor or a metastasis from an unknown primary. The etiology
is unclear and PET/CT may be helpful in further assessing the
probability of metastatic disease.
3. Mild cervical and lumbar degenerative disc disease with mild
spinal canal stenosis at C6-7.

## 2020-11-15 IMAGING — MR MR CERVICAL SPINE WO/W CM
5 of 8 series · 28 of 48 positions shown · IV contrast (6ml Gadavist)
Comparison: None.

CLINICAL DATA: Found unresponsive

EXAM:
MRI CERVICAL, THORACIC AND LUMBAR SPINE WITHOUT AND WITH CONTRAST
TECHNIQUE: Multiplanar and multiecho pulse sequences of the cervical spine, to
include the craniocervical junction and cervicothoracic junction,
and thoracic and lumbar spine, were obtained without and with
intravenous contrast.
CONTRAST:  6mL GADAVIST GADOBUTROL 1 MMOL/ML IV SOLN

[Series 9: T2 · sagittal · 3.0mm · 0.62mm/px · 4 of 15 slices shown (1 of 2)]
[im 1/15]
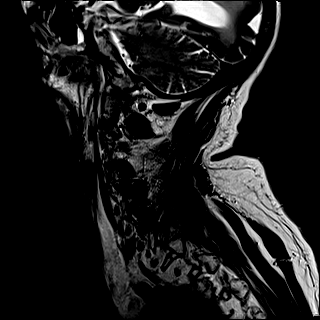
[im 5/15]
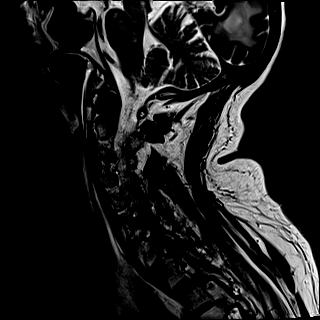
[im 10/15]
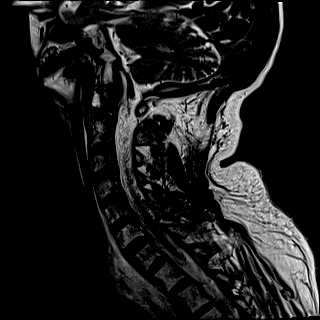
[im 15/15]
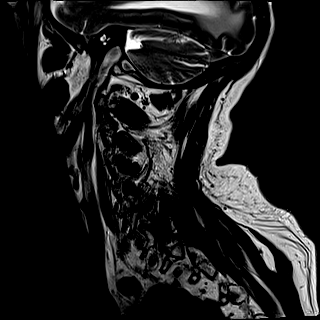

[Series 11: STIR · sagittal · 3.0mm · 0.62mm/px · 4 of 15 slices shown]
[im 1/15]
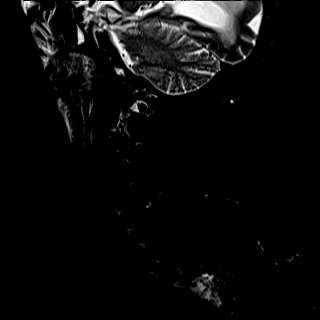
[im 5/15]
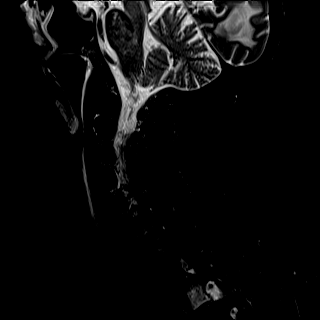
[im 10/15]
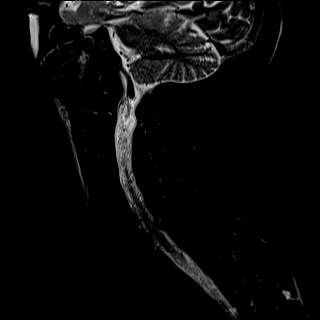
[im 15/15]
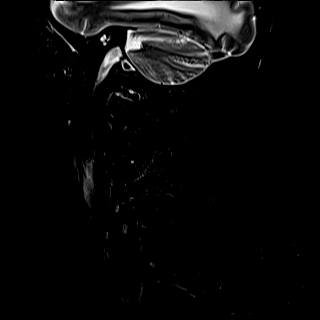

[Series 12: T2 · axial · 3.0mm · 0.70mm/px · z∈[-256,-165]mm · 8 of 29 slices shown (2 of 2)]
[im 1/29]
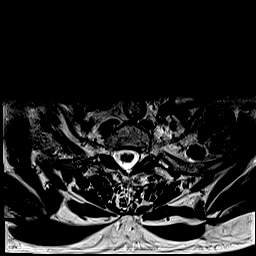
[im 5/29]
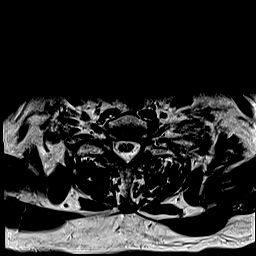
[im 9/29]
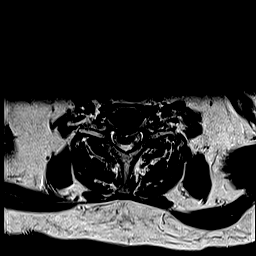
[im 13/29]
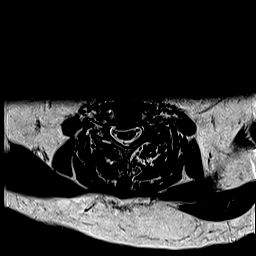
[im 17/29]
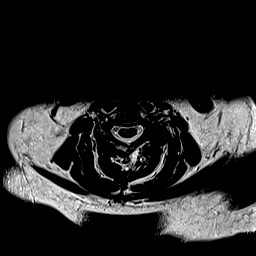
[im 21/29]
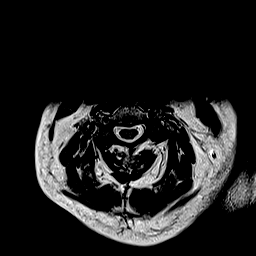
[im 25/29]
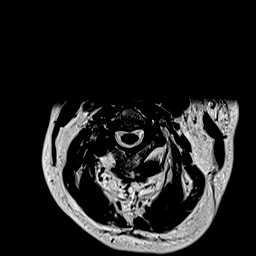
[im 29/29]
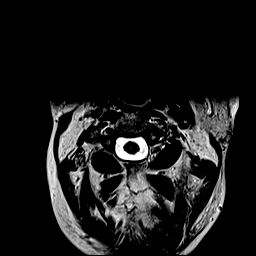

[Series 14: T1 · axial · non-contrast · 3.0mm · 0.35mm/px · z∈[-256,-165]mm · 8 of 29 slices shown]
[im 1/29]
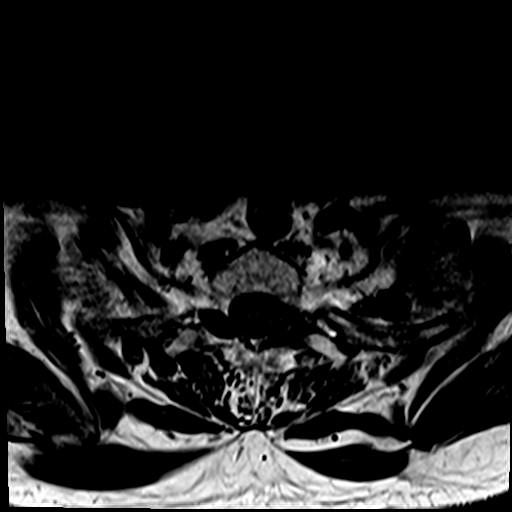
[im 5/29]
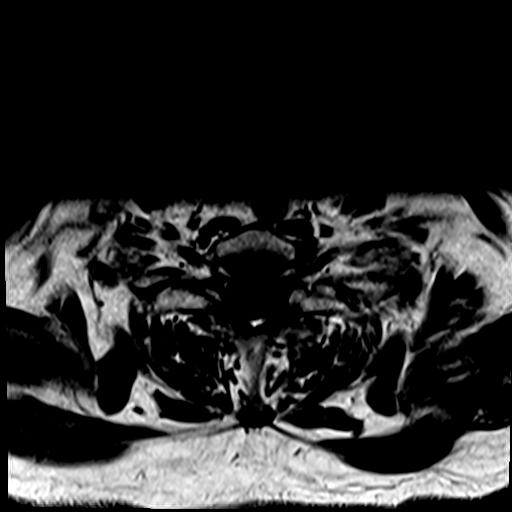
[im 9/29]
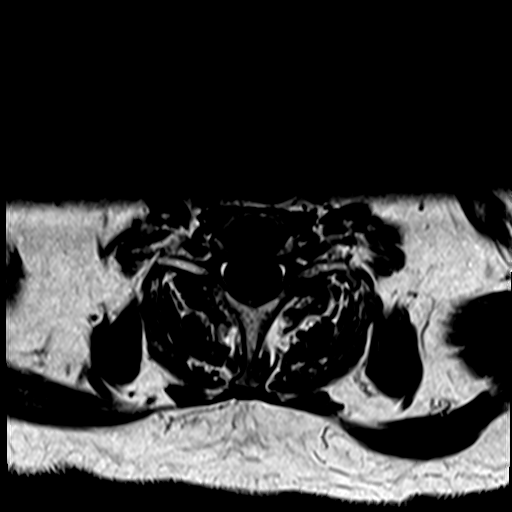
[im 13/29]
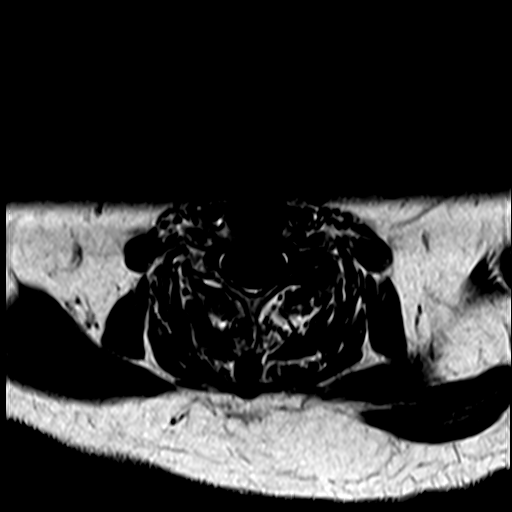
[im 17/29]
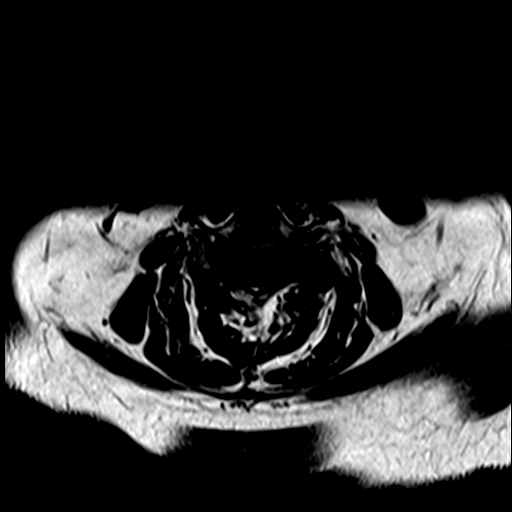
[im 21/29]
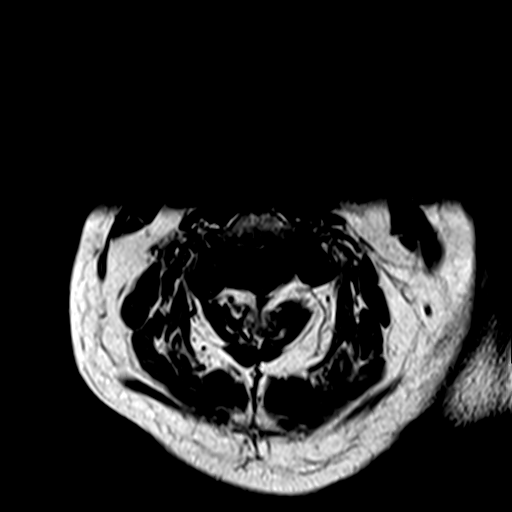
[im 25/29]
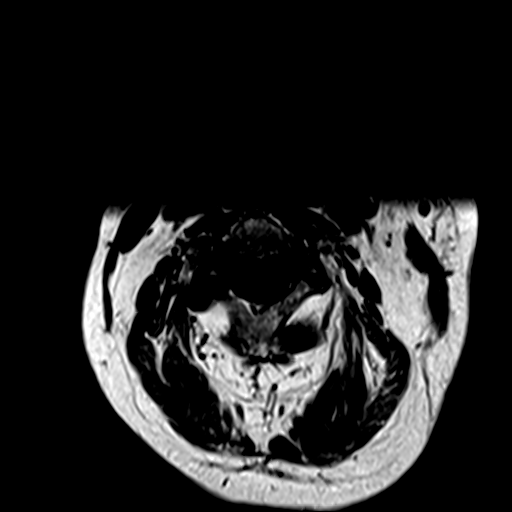
[im 29/29]
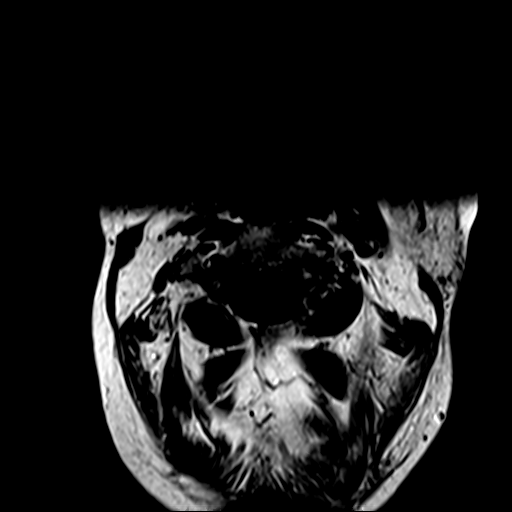

[Series 16: T1 post-contrast · axial · 3.0mm · 0.35mm/px · z∈[-256,-217]mm · 4 of 29 slices shown]
[im 1/29]
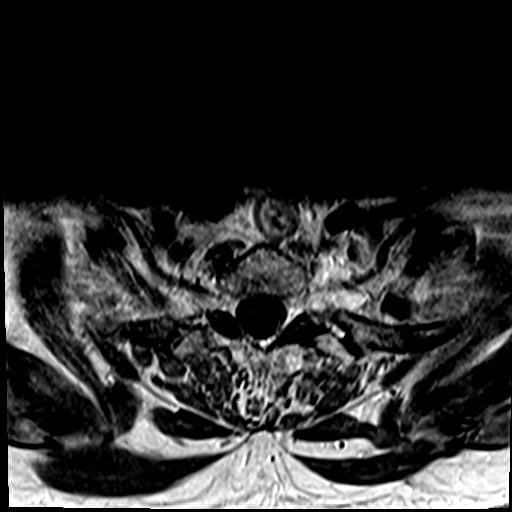
[im 5/29]
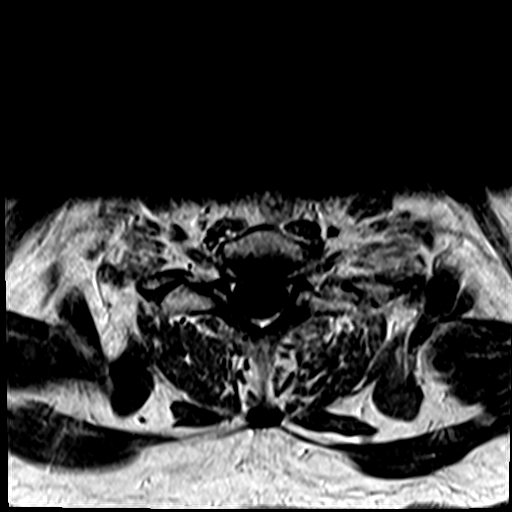
[im 9/29]
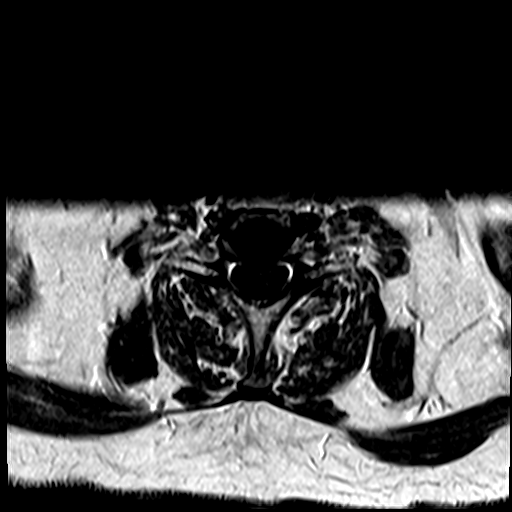
[im 13/29]
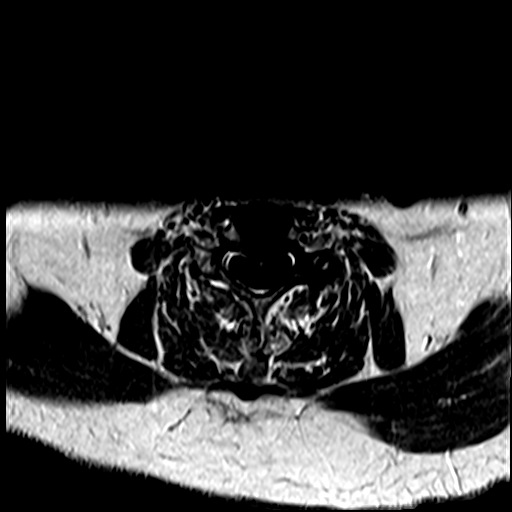

[28 of 48 positions shown; findings below may reference images not displayed]

FINDINGS: MRI CERVICAL SPINE FINDINGS

Alignment: Physiologic.

Vertebrae: No fracture, evidence of discitis, or bone lesion.

Cord: Normal signal and morphology.

Posterior Fossa, vertebral arteries, paraspinal tissues: Negative.

Disc levels:

C2-3: Unremarkable.

C3-4: Small central disc protrusion without stenosis.

C4-5: Left facet hypertrophy.  No stenosis.

C5-6: Small right subarticular disc protrusion indenting the ventral
spinal cord. No spinal canal stenosis.

C6-7: Small disc bulge narrowing the ventral thecal sac. Mild spinal
canal stenosis.

C7-T1: Unremarkable

MRI THORACIC SPINE FINDINGS

Alignment:  Physiologic.

Vertebrae: There is a paraspinal mass on the left at T2-3 that also
involves the left half of T2 vertebral body and the left pedicle.
The lesion is diffusely contrast-enhancing. Otherwise, the thoracic
vertebral bodies are normal.

Cord:  Normal signal and morphology.  No epidural collection.

Paraspinal and other soft tissues: Biapical pulmonary scarring. Left
basilar opacity.

Disc levels:

No spinal canal or neural foraminal stenosis.

MRI LUMBAR SPINE FINDINGS

Segmentation:  Standard

Alignment:  Grade 1 anterolisthesis at L4-5

Vertebrae:  No fracture, evidence of discitis, or bone lesion.

Conus medullaris and cauda equina: Conus extends to the L1 level.
Conus and cauda equina appear normal.

Paraspinal and other soft tissues: Negative

Disc levels:

L1-2: Normal.

L2-3: Small disc bulge without stenosis.

L3-4: Small disc bulge and mild facet hypertrophy without stenosis.

L4-5: Small disc bulge and moderate facet hypertrophy with mild
spinal canal stenosis and mild left foraminal stenosis.

L5-S1: Left asymmetric disc bulge and mild facet hypertrophy without
stenosis.
IMPRESSION: 1. No acute abnormality of the cervical, thoracic or lumbar spine.
2. Left paraspinal mass at T2-3 with involvement of the left
pedicle. This could indicate extra medullary hematopoiesis, a nerve
sheath tumor or a metastasis from an unknown primary. The etiology
is unclear and PET/CT may be helpful in further assessing the
probability of metastatic disease.
3. Mild cervical and lumbar degenerative disc disease with mild
spinal canal stenosis at C6-7.

## 2020-11-15 IMAGING — MR MR HEAD WO/W CM
15 series · 48 of 48 positions shown · IV contrast (6ml Gadavist)
Comparison: [DATE]

CLINICAL DATA: Found unresponsive

EXAM:
MRI HEAD WITHOUT AND WITH CONTRAST
TECHNIQUE: Multiplanar, multiecho pulse sequences of the brain and surrounding
structures were obtained without and with intravenous contrast.
CONTRAST:  6mL GADAVIST GADOBUTROL 1 MMOL/ML IV SOLN

[Series 5: ax dwi_tracew · axial · 3.0mm · 0.65mm/px · z∈[-76,+74]mm · 4 of 48 slices shown]
[im 1/48]
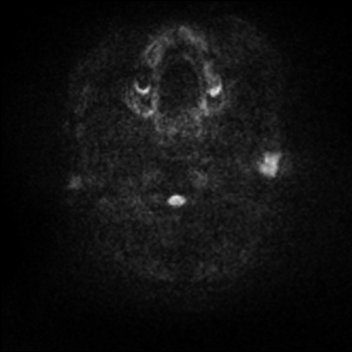
[im 16/48]
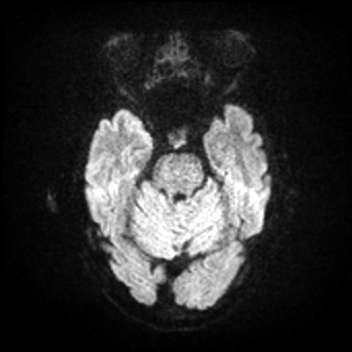
[im 32/48]
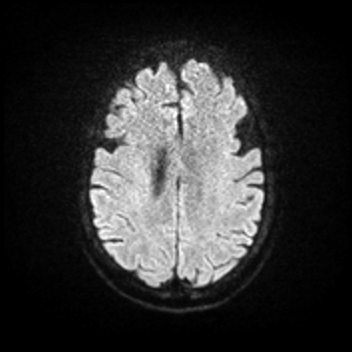
[im 48/48]
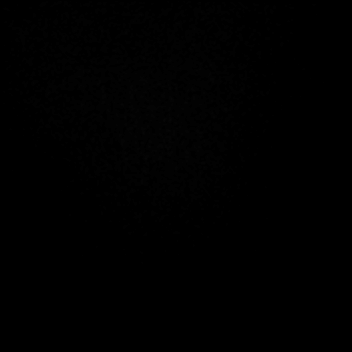

[Series 6: ax dwi_adc · axial · 3.0mm · 0.65mm/px · z∈[-76,+58]mm · 2 of 43 slices shown]
[im 1/43]
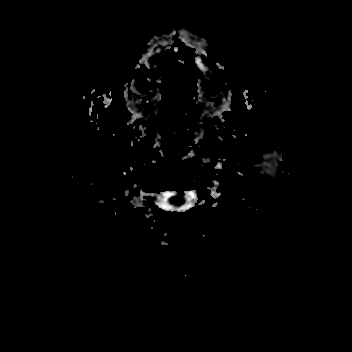
[im 43/43]
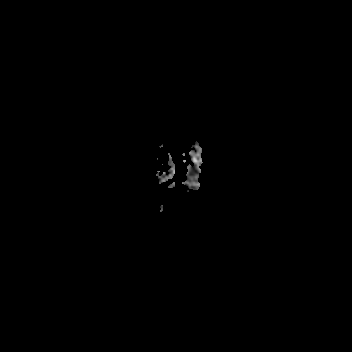

[Series 7: cor dwi_tracew · coronal · 5.0mm · 1.31mm/px · 2 of 38 slices shown]
[im 1/38]
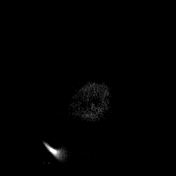
[im 38/38]
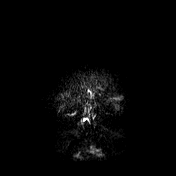

[Series 8: cor dwi_adc · coronal · 5.0mm · 1.31mm/px · 2 of 38 slices shown]
[im 1/38]
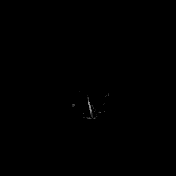
[im 38/38]
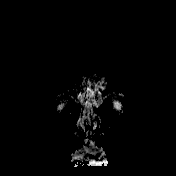

[Series 9: T1 · sagittal · 5.0mm · 0.62mm/px · 1 of 25 slices shown (1 of 2)]
[im 1/25]
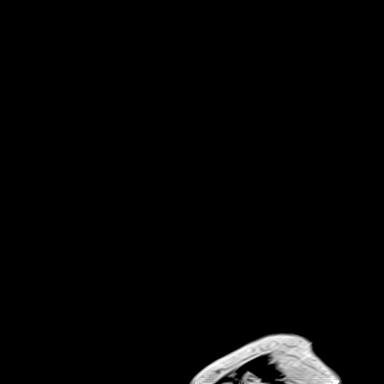

[Series 10: T2 · axial · 5.0mm · 0.53mm/px · 1 of 25 slices shown (1 of 2)]
[im 1/25]
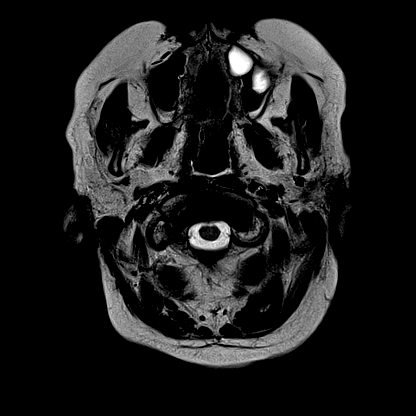

[Series 12: pha_images · axial · 3.0mm · 0.90mm/px · z∈[-85,+83]mm · 3 of 57 slices shown]
[im 1/57]
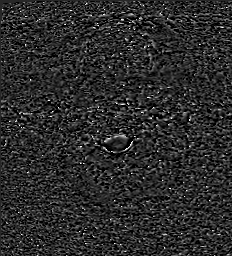
[im 29/57]
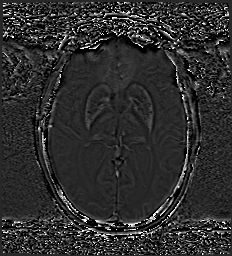
[im 57/57]
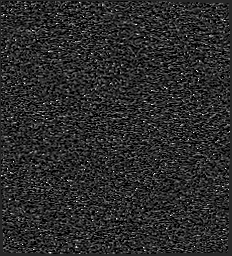

[Series 13: swi_images · axial · 3.0mm · 0.90mm/px · z∈[-88,+83]mm · 3 of 60 slices shown]
[im 1/60]
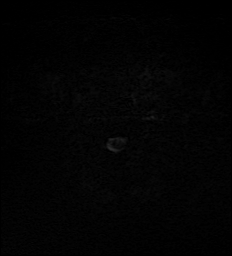
[im 30/60]
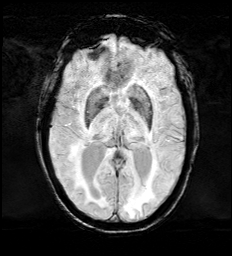
[im 60/60]
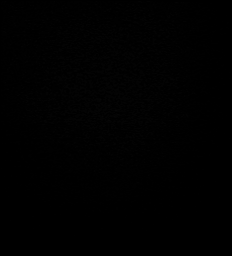

[Series 15: FLAIR · axial · 3.0mm · 0.53mm/px · z∈[-81,+76]mm · 3 of 55 slices shown]
[im 1/55]
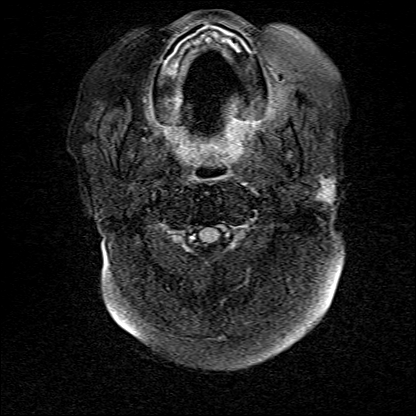
[im 28/55]
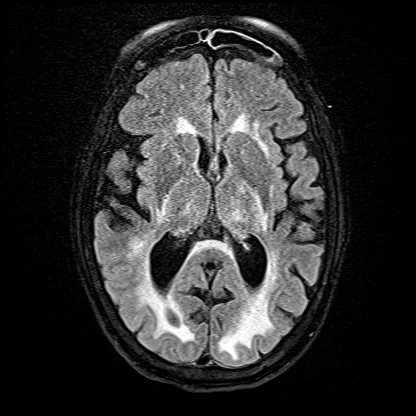
[im 55/55]
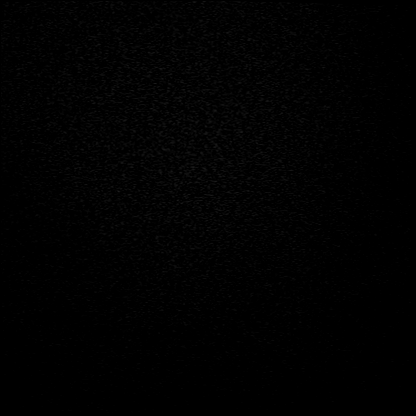

[Series 16: T1 · axial · 1.0mm · 0.98mm/px · z∈[-84,+85]mm · 10 of 176 slices shown (2 of 2)]
[im 1/176]
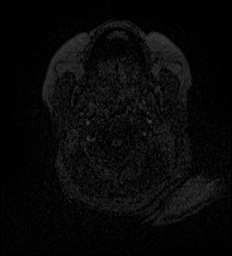
[im 20/176]
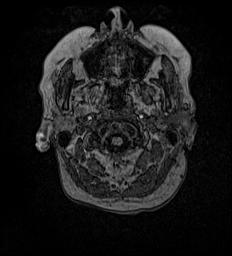
[im 39/176]
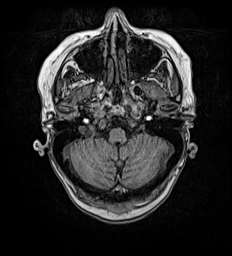
[im 59/176]
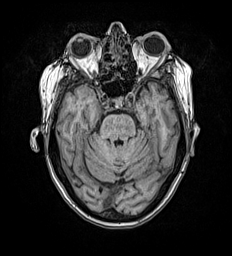
[im 78/176]
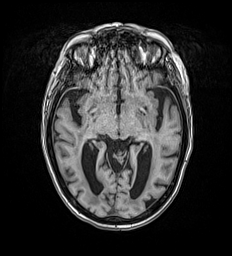
[im 98/176]
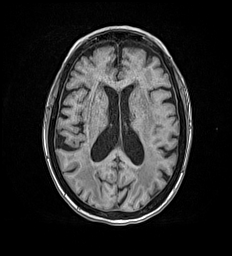
[im 117/176]
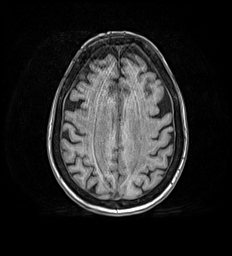
[im 137/176]
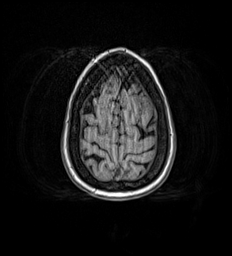
[im 156/176]
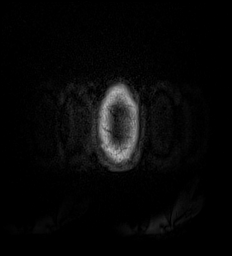
[im 176/176]
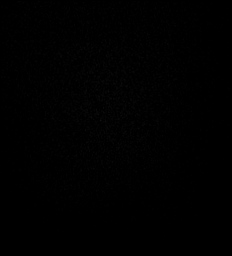

[Series 17: T2 · coronal · 5.0mm · 0.57mm/px · 2 of 29 slices shown (2 of 2)]
[im 1/29]
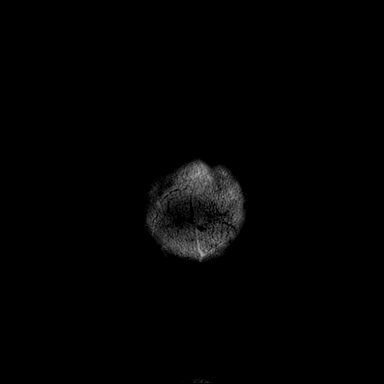
[im 29/29]
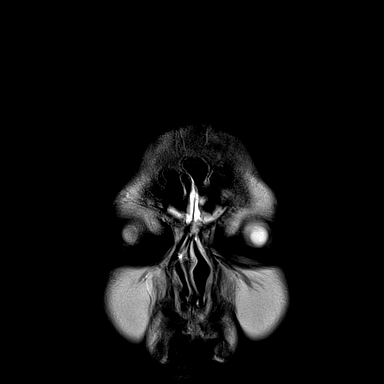

[Series 22: T2 post-contrast · coronal · 5.0mm · 0.57mm/px · 2 of 31 slices shown]
[im 1/31]
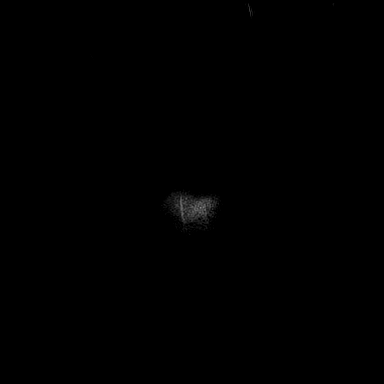
[im 31/31]
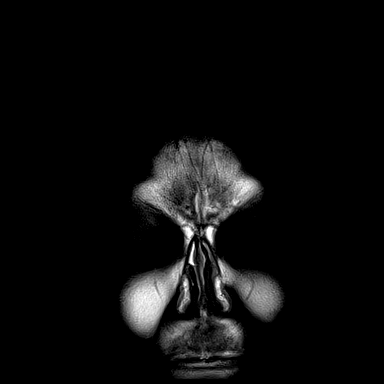

[Series 23: T1 post-contrast · axial · 1.0mm · 0.98mm/px · z∈[-84,+85]mm · 10 of 176 slices shown (1 of 3)]
[im 1/176]
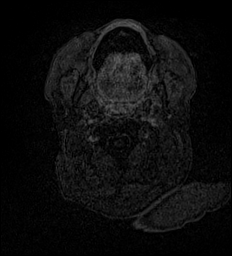
[im 20/176]
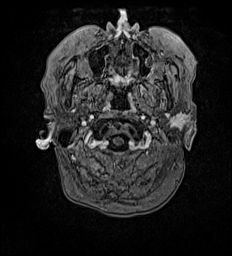
[im 39/176]
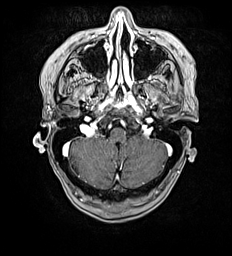
[im 59/176]
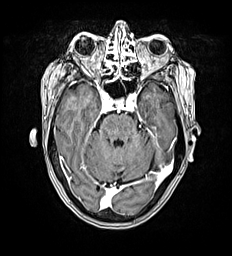
[im 78/176]
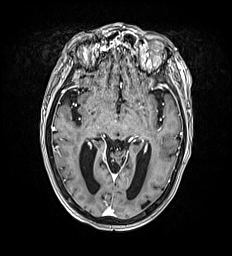
[im 98/176]
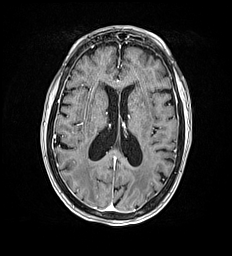
[im 117/176]
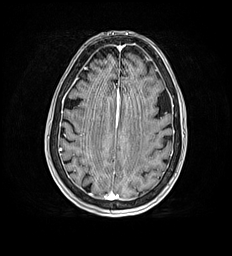
[im 137/176]
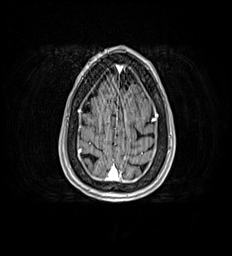
[im 156/176]
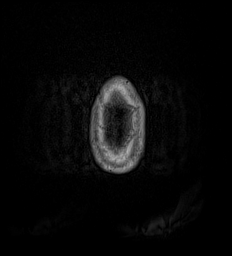
[im 176/176]
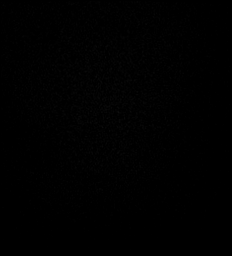

[Series 24: T1 post-contrast · coronal · 5.0mm · 0.57mm/px · 2 of 31 slices shown (2 of 3)]
[im 1/31]
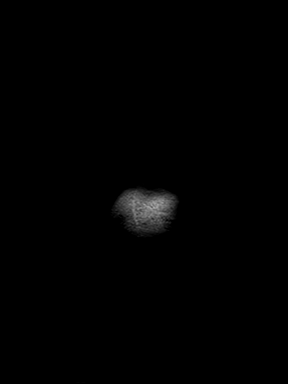
[im 31/31]
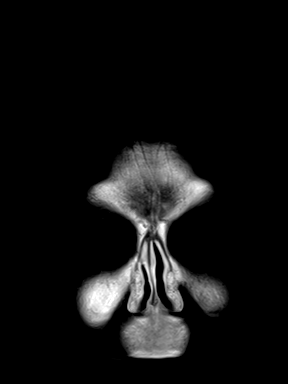

[Series 25: T1 post-contrast · sagittal · 5.0mm · 0.62mm/px · 1 of 25 slices shown (3 of 3)]
[im 1/25]
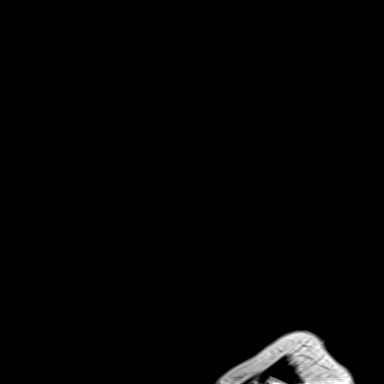

[48 of 48 positions shown; findings below may reference images not displayed]

FINDINGS: Brain: No acute infarct, mass effect or extra-axial collection. No
acute or chronic hemorrhage. Confluent hyperintense T2-weighted
white matter signal. Generalized volume loss without a clear lobar
predilection. The midline structures are normal.

Vascular: Major flow voids are preserved.

Skull and upper cervical spine: Normal calvarium and skull base.
Visualized upper cervical spine and soft tissues are normal.

Sinuses/Orbits:No paranasal sinus fluid levels or advanced mucosal
thickening. No mastoid or middle ear effusion. Normal orbits.
IMPRESSION: 1. No acute intracranial abnormality.
2. Confluent hyperintense T2-weighted white matter signal, most
consistent with chronic microvascular ischemia.
3. Generalized volume loss without a clear lobar predilection.

Cerebral Atrophy ([83]-[83]).

## 2020-11-15 MED ORDER — GADOBUTROL 1 MMOL/ML IV SOLN
6.0000 mL | Freq: Once | INTRAVENOUS | Status: AC | PRN
  Administered 2020-11-16: 6 mL via INTRAVENOUS

## 2020-11-15 MED ORDER — HEPARIN SODIUM (PORCINE) 5000 UNIT/ML IJ SOLN
5000.0000 [IU] | Freq: Three times a day (TID) | INTRAMUSCULAR | Status: DC
  Administered 2020-11-16 – 2020-11-18 (×6): 5000 [IU] via SUBCUTANEOUS
  Filled 2020-11-15 (×5): qty 1

## 2020-11-15 MED ORDER — DEXAMETHASONE 0.5 MG PO TABS
2.0000 mg | ORAL_TABLET | Freq: Every day | ORAL | Status: DC
  Filled 2020-11-15: qty 4

## 2020-11-15 MED ORDER — INSULIN ASPART 100 UNIT/ML IJ SOLN
0.0000 [IU] | Freq: Every day | INTRAMUSCULAR | Status: DC
  Administered 2020-11-17: 3 [IU] via SUBCUTANEOUS
  Filled 2020-11-15 (×2): qty 1

## 2020-11-15 MED ORDER — ACETAMINOPHEN 10 MG/ML IV SOLN
1000.0000 mg | Freq: Four times a day (QID) | INTRAVENOUS | Status: AC
  Administered 2020-11-15: 1000 mg via INTRAVENOUS
  Filled 2020-11-15 (×4): qty 100

## 2020-11-15 MED ORDER — SODIUM CHLORIDE 0.9 % IV BOLUS
1000.0000 mL | Freq: Once | INTRAVENOUS | Status: AC
  Administered 2020-11-15: 1000 mL via INTRAVENOUS

## 2020-11-15 MED ORDER — INSULIN ASPART 100 UNIT/ML IJ SOLN
0.0000 [IU] | Freq: Three times a day (TID) | INTRAMUSCULAR | Status: DC
  Administered 2020-11-16: 3 [IU] via SUBCUTANEOUS
  Administered 2020-11-17: 11 [IU] via SUBCUTANEOUS
  Administered 2020-11-17: 5 [IU] via SUBCUTANEOUS
  Administered 2020-11-17: 8 [IU] via SUBCUTANEOUS
  Administered 2020-11-18: 3 [IU] via SUBCUTANEOUS
  Filled 2020-11-15 (×3): qty 1

## 2020-11-15 MED ORDER — LACTATED RINGERS IV SOLN: INTRAVENOUS | Status: DC

## 2020-11-15 MED ORDER — METRONIDAZOLE 500 MG/100ML IV SOLN
500.0000 mg | Freq: Once | INTRAVENOUS | Status: AC
  Administered 2020-11-15: 500 mg via INTRAVENOUS
  Filled 2020-11-15: qty 100

## 2020-11-15 MED ORDER — VALACYCLOVIR HCL 500 MG PO TABS
1000.0000 mg | ORAL_TABLET | Freq: Two times a day (BID) | ORAL | Status: DC
  Administered 2020-11-17 – 2020-11-18 (×3): 1000 mg via ORAL
  Filled 2020-11-15 (×5): qty 2

## 2020-11-15 MED ORDER — ONDANSETRON HCL 4 MG/2ML IJ SOLN: 4.0000 mg | Freq: Four times a day (QID) | INTRAMUSCULAR | Status: DC | PRN

## 2020-11-15 MED ORDER — ACETAMINOPHEN 325 MG PO TABS: 650.0000 mg | ORAL_TABLET | Freq: Four times a day (QID) | ORAL | Status: DC | PRN

## 2020-11-15 MED ORDER — VANCOMYCIN HCL IN DEXTROSE 1-5 GM/200ML-% IV SOLN
1000.0000 mg | Freq: Once | INTRAVENOUS | Status: AC
  Administered 2020-11-15: 1000 mg via INTRAVENOUS
  Filled 2020-11-15: qty 200

## 2020-11-15 MED ORDER — ACETAMINOPHEN 650 MG RE SUPP
650.0000 mg | Freq: Four times a day (QID) | RECTAL | Status: DC | PRN
  Filled 2020-11-15: qty 1

## 2020-11-15 MED ORDER — SODIUM CHLORIDE 0.9 % IV SOLN
2.0000 g | Freq: Once | INTRAVENOUS | Status: AC
  Administered 2020-11-15: 2 g via INTRAVENOUS
  Filled 2020-11-15: qty 2

## 2020-11-15 MED ORDER — ONDANSETRON HCL 4 MG PO TABS: 4.0000 mg | ORAL_TABLET | Freq: Four times a day (QID) | ORAL | Status: DC | PRN

## 2020-11-15 NOTE — Progress Notes (Signed)
CODE SEPSIS - PHARMACY COMMUNICATION  **Broad Spectrum Antibiotics should be administered within 1 hour of Sepsis diagnosis**  Time Code Sepsis Called/Page Received: 2108  Antibiotics Ordered: Cefepime + vancomycin + metronidazole  Time of 1st antibiotic administration: 2115  Additional action taken by pharmacy: N/A  Benita Gutter 11/15/2020  9:18 PM

## 2020-11-15 NOTE — H&P (Addendum)
History and Physical   Beth Stanley MMN:[id number] DOB: 09-18-1950 DOA: 11/15/2020  PCP: Imagene Riches, NP  Outpatient Specialists: Dr. Jolayne Haines, hematology and oncology Patient coming from: Home  I have personally briefly reviewed patient's old medical records in [place].  Chief Concern: Lethargy  HPI: Beth Stanley is a 70 y.o. 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  Spouse reports that upon returning home from the oncology office, patient states that she was tired.  So he allowed her to sleep in the car.  He reports that patient slept from approximately 3 pm - 7 pm, she was sitting in the car, with car turned off, outside and not in an enclosed space like a garage.  It was noted in the chart the patient was unresponsive however patient was just sleeping and difficult to arouse.  Spouse at bedside reports that patient took her Decadron prior to seeing her oncologist on 11/15/20.  He further reports that her vitals were noted to be normal at the oncologist office visit.  He did endorse that patient just feels overall generalized weakness prior to going to the doctor however they attribute this to her chronic medical issues.  He denies any vomiting.  At bedside patient is snoring.  She is arousable to sternal rub and loud verbal stimuli.  She wakes up and answers my questions regarding her name, husband at bedside, location of hospital correctly.  When I lifted 1 finger and ask her how many fingers I had up, she jokingly states 15 then laughs.  Then she states 1.  She further denies any chest pain, shortness of breath, discomfort.  Social history: She lives at home with her husband. She is a current tobacco user, at her peak she smoked 1 ppd. She does not drink etoh or uses recreational drugs.  She formerly  worked as a Haematologist.  Vaccination history: She is vaccinated for covid 19, 3 doses.   ROS: Unable to complete  Constitutional: no weight change, no fever ENT/Mouth: no sore throat, no rhinorrhea Eyes: no eye pain, no vision changes Cardiovascular: no chest pain, no dyspnea,  no edema, no palpitations Respiratory: no cough, no sputum, no wheezing Gastrointestinal: no nausea, no vomiting, no diarrhea, no constipation Genitourinary: no urinary incontinence, no dysuria, no hematuria Musculoskeletal: no arthralgias, no myalgias Skin: no skin lesions, no pruritus, Neuro: + weakness, no loss of consciousness, no syncope Psych: no anxiety, no depression, + decrease appetite Heme/Lymph: no bruising, no bleeding  ED Course: Discussed with emergency medicine provider, patient requiring hospitalization for chief concerns of sepsis.  Vitals in the emergency department was remarkable for T-max temperature of 101, respiration rate of 17, heart rate of 111, blood pressure 147/90, SPO2 of 96% on 2 L nasal cannula.  Labs in the emergency department was remarkable for sodium 137, potassium 4.0, chloride 97, bicarb 27, BUN of 18, serum creatinine of 0.91, nonfasting blood glucose 170, GFR greater than 60, CK is 32, WBC was 9.1, hemoglobin 13.3, platelets 140.  Patient tested positive for COVID PCR.  In the emergency department patient was given cefepime, vancomycin, metronidazole 500 mg IV once.  Lactated ringer IVF at 150 mL/h.  Sodium chloride 1 L bolus.  Assessment/Plan  Principal Problem:   Altered mental status Active Problems:   Lymphoma ([place])   Hyperlipidemia   Hypertension  Depression   Smoker   COVID-19 virus infection   Encephalopathy acute   Sepsis ([place])   # Lethargy and altered mentation-etiology work-up in progress, likely multifactorial including left lower lobe infiltrate pneumonia, however given that patient is immunocompromise bacteremia cannot be excluded at this time,  polypharmacy is also a consideration # Met sepsis criteria with fever, source of pneumonia, increased heart rate, increased respiration rate - Holding home naloxone strips - Patient is able to protect her airway at this time - Patient denies any headaches and or cervical tenderness - Given the patient presented with altered mentation and fever, meningitis was considered however with patient's history of diffuse B-cell lymphoma with T2-T3 invasion, lumbar puncture was deferred due to concerns for mechanical introduction of cancerous cells into the CSF space - A.m. team to consider consultation to infectious disease for further recommendations - We will continue with cefepime and vancomycin - Blood cultures x2, urine culture are in process - Ordered respiratory 20 pathogen panel by PCR - Admit to progressive cardiac, observation, telemetry - Continue aspiration precautions and fall precautions  # None insulin dependent diabetes mellitus-SSI with at bedtime coverage ordered # Left parotid mass # Mass at T2-T3, possible metastasis # Diffuse large B-cell lymphoma  Chart reviewed.   Hospitalization from 11/05/2020 to 11/09/2020: Patient was admitted for acute metabolic encephalopathy presumed secondary to COVID-pneumonia. Per discharge summary from [facility], patient had a T-max of 101.8, heart rate of 10 3-1 14, she was hypoxic requiring 2 L nasal cannula for an episode of desaturation. CT the head was done and found to be negative for acute intracranial abnormality. Patient was admitted for presumed COVID infection, COPD exacerbation, and UTI that was present on admission and possible steroid withdrawal.  It was noted that patient abruptly stopped her Decadron at home.  Per discharge note, patient was treated empirically with Rocephin on admission and transition to p.o. Augmentin to complete 5-day course at the time of discharge.  DVT prophylaxis: Heparin 5000 units, subcutaneous, every 8  hours Code Status: Full code Diet: N.p.o. Family Communication: Updated husband at bedside Disposition Plan: Pending clinical course Consults called: None at this time Admission status: Progressive cardiac, observation, telemetry  Past Medical History:  Diagnosis Date   Anxiety    Arthritis    Diabetes ([place])    Heartburn    HTN (hypertension)    Non Hodgkin's lymphoma ([place]) 2004   Hx chemo tx's.    Past Surgical History:  Procedure Laterality Date   TOTAL ABDOMINAL HYSTERECTOMY     Social History:  reports that she has been smoking cigarettes. She has been smoking an average of 1 pack per day. She has never used smokeless tobacco. She reports that she does not currently use alcohol. She reports that she does not use drugs.  Allergies  Allergen Reactions   Aspirin     Stomach cramp   Latex Swelling   Other    Tape Rash    Makes Whelps on Skin Makes Whelps on Skin   Family History  Problem Relation Age of Onset   Diabetes Mother    Lung cancer Sister    Lung cancer Brother    Kidney cancer Brother        removed kidney    Lung cancer Brother    Prostate cancer Neg Hx    Bladder Cancer Neg Hx    Family history: Family history reviewed and not pertinent  Prior to Admission medications   Medication Sig  Start Date End Date Taking? Authorizing Provider  carboxymethylcellulose (REFRESH PLUS) 0.5 % SOLN Place 1 drop into the left eye 2 (two) times daily. 10/19/20  Yes [provider]  cyanocobalamin 1000 MCG tablet Take 1 tablet by mouth daily. 04/17/20  Yes [provider]  dexamethasone (DECADRON) 2 MG tablet Take 1 tablet (56m) by mouth twice daily for 4 days then take 1 tablet (252m by mouth daily for 4 days 11/09/20  Yes [provider]  albuterol (VENTOLIN HFA) 108 (90 Base) MCG/ACT inhaler Inhale 1 puff into the lungs every 6 (six) hours as needed for wheezing. 09/02/19   [provider]  amoxicillin-clavulanate (AUGMENTIN) 875-125 MG  tablet Take 1 tablet by mouth 2 (two) times daily. 11/09/20   [provider]  Buprenorphine HCl-Naloxone HCl 8-2 MG FILM Place 1 strip under the tongue 3 (three) times daily. 11/04/20   [provider]  Cholecalciferol 25 MCG (1000 UT) tablet Take 1 tablet by mouth daily.    [provider]  dexamethasone (DECADRON) 2 MG tablet Take 2 tablets by mouth daily. 11/09/20   [provider]  JARDIANCE 25 MG TABS tablet Take 25 mg by mouth daily. 08/17/20   [provider]  Lancets (FREESTYLE) lancets TEST BLOOD GLUCOSE ONCE DAILY 07/28/16   ClTereasa CoopPA-C  magnesium oxide (MAG-OX) 400 (240 Mg) MG tablet Take 1 tablet by mouth daily. 10/01/20   [provider]  Multiple Vitamin (MULTIVITAMIN) tablet Take 1 tablet by mouth daily.    [provider]  omeprazole (PRILOSEC) 20 MG capsule Take 20 mg by mouth daily. 08/25/20   [provider]  potassium chloride SA (KLOR-CON) 20 MEQ tablet Take 20 mEq by mouth daily. 07/28/19   [provider]  ramipril (ALTACE) 2.5 MG capsule Take 1 capsule (2.5 mg total) by mouth daily. 08/08/17   ClTereasa CoopPA-C  sertraline (ZOLOFT) 100 MG tablet TAKE ONE (1) TABLET EACH DAY Patient taking differently: Take 150 mg by mouth daily.  06/10/17   ClTereasa CoopPA-C  valACYclovir (VALTREX) 1000 MG tablet Take 1,000 mg by mouth 2 (two) times daily. 08/22/20   [provider]  valACYclovir (VALTREX) 500 MG tablet Take 500 mg by mouth daily as needed. 06/15/19   [provider]   Physical Exam: Vitals:   11/15/20 2237 11/15/20 2245 11/15/20 2300 11/15/20 2315  BP: 113/62 108/60 117/60 121/66  Pulse: 98     Resp: '13 15 15 12  ' Temp: 99 F (37.2 C)     TempSrc: Oral     SpO2:      Weight:      Height:       Constitutional: appears age-appropriate, frail, NAD, calm, comfortable Eyes: PERRL, lids and conjunctivae normal ENMT: Mucous membranes are moist. Posterior  pharynx clear of any exudate or lesions. Age-appropriate dentition. Hearing appropriate Neck: normal, supple, no masses, no thyromegaly Respiratory: clear to auscultation bilaterally, no wheezing, no crackles. Normal respiratory effort. No accessory muscle use.  Cardiovascular: Regular rate and rhythm, no murmurs / rubs / gallops. No extremity edema. 2+ pedal pulses. No carotid bruits.  Abdomen: no tenderness, no masses palpated, no hepatosplenomegaly. Bowel sounds positive.  Musculoskeletal: no clubbing / cyanosis. No joint deformity upper and lower extremities. Good ROM, no contractures, no atrophy. Normal muscle tone.  Skin: no rashes, lesions, ulcers. No induration Neurologic: Sensation intact. Strength 4/5 in all 4.  Psychiatric: Unable to assess judgment and insight. Alert and oriented x  self and husband at bedside.  Sleeping mood.  EKG: independently reviewed, showing sinus tachycardia with rate of 111, QTc 441  Chest x-ray on Admission: I personally reviewed and I agree with radiologist reading as below.  CT HEAD WO CONTRAST (5MM)  Result Date: 11/15/2020 CLINICAL DATA:  Found unresponsive. EXAM: CT HEAD WITHOUT CONTRAST TECHNIQUE: Contiguous axial images were obtained from the base of the skull through the vertex without intravenous contrast. COMPARISON:  September 29, 2019 FINDINGS: Brain: There is mild to moderate severity cerebral atrophy with widening of the extra-axial spaces and ventricular dilatation. There are areas of decreased attenuation within the white matter tracts of the supratentorial brain, consistent with microvascular disease changes. Vascular: No hyperdense vessel or unexpected calcification. Skull: Normal. Negative for fracture or focal lesion. Sinuses/Orbits: Mild frontal sinus, mild bilateral maxillary sinus and moderate severity bilateral ethmoid sinus mucosal thickening is seen. Other: None. IMPRESSION: 1. Generalized cerebral atrophy. 2. No acute intracranial  abnormality. 3. Paranasal sinus disease. Electronically Signed   By: Virgina Norfolk M.D.   On: 11/15/2020 21:53   DG Chest Port 1 View  Result Date: 11/15/2020 CLINICAL DATA:  Fever, unresponsive EXAM: PORTABLE CHEST 1 VIEW COMPARISON:  03/05/2020 FINDINGS: Mild left lower lobe opacity, likely atelectasis, less likely pneumonia. Right lung is clear. No pleural effusion or pneumothorax. Heart is normal in size.  Thoracic aortic atherosclerosis. Right chest power port terminates in the upper right atrium. IMPRESSION: Mild left lower lobe opacity, likely atelectasis, less likely pneumonia. Electronically Signed   By: Julian Hy M.D.   On: 11/15/2020 21:19   CT CHEST ABDOMEN PELVIS WO CONTRAST  Result Date: 11/15/2020 CLINICAL DATA:  Found unresponsive. EXAM: CT CHEST, ABDOMEN AND PELVIS WITHOUT CONTRAST TECHNIQUE: Multidetector CT imaging of the chest, abdomen and pelvis was performed following the standard protocol without IV contrast. COMPARISON:  September 29, 2019 FINDINGS: CT CHEST FINDINGS Cardiovascular: A right-sided venous Port-A-Cath is in place. Moderate severity calcification of the aortic arch is noted without evidence of aortic aneurysm. Normal heart size. No pericardial effusion. Mediastinum/Nodes: No enlarged mediastinal, hilar, or axillary lymph nodes. Thyroid gland, trachea, and esophagus demonstrate no significant findings. Lungs/Pleura: Mild-to-moderate severity central lobular emphysema is seen with stable, marked severity, partially calcified areas of scarring and/or atelectasis are seen within the bilateral apices. Mild to moderate severity infiltrate is seen within the anterior aspect of the left lung base. There is no evidence of a pleural effusion or pneumothorax. Musculoskeletal: Multilevel degenerative changes are seen throughout the thoracic spine. CT ABDOMEN PELVIS FINDINGS Hepatobiliary: No focal liver abnormality is seen. No gallstones, gallbladder wall thickening, or  biliary dilatation. Pancreas: Unremarkable. No pancreatic ductal dilatation or surrounding inflammatory changes. Spleen: Normal in size without focal abnormality. Adrenals/Urinary Tract: Adrenal glands are unremarkable. Kidneys are normal, without renal calculi, focal lesion, or hydronephrosis. Bladder is unremarkable. Stomach/Bowel: Stomach is within normal limits. Appendix appears normal. Stool is seen throughout the large bowel. No evidence of bowel wall thickening, distention, or inflammatory changes. Vascular/Lymphatic: Aortic atherosclerosis. No enlarged abdominal or pelvic lymph nodes. Reproductive: Status post hysterectomy. No adnexal masses. Other: No abdominal wall hernia or abnormality. No abdominopelvic ascites. Musculoskeletal: Multilevel degenerative changes are seen throughout the lumbar spine. IMPRESSION: 1. Mild-to-moderate severity left lower lobe infiltrate. 2. Emphysematous lung disease with stable, marked severity, partially calcified areas of biapical scarring and/or atelectasis. 3. No acute or active process within the abdomen or pelvis. 4. Aortic atherosclerosis. Aortic Atherosclerosis (ICD10-I70.0) and Emphysema (ICD10-J43.9). Electronically Signed   By: Hoover Browns  [place] M.D.   On: 11/15/2020 22:04    Labs on Admission: I have personally reviewed following labs  CBC: Recent Labs  Lab 11/15/20 2103  WBC 9.1  NEUTROABS 7.0  HGB 13.6  HCT 41.4  MCV 96.1  PLT 128*   Basic Metabolic Panel: Recent Labs  Lab 11/15/20 2103  NA 137  K 4.0  CL 97*  CO2 27  GLUCOSE 170*  BUN 18  CREATININE 0.91  CALCIUM 9.0   GFR: Estimated Creatinine Clearance: 53.9 mL/min (by C-G formula based on SCr of 0.91 mg/dL).  Liver Function Tests: Recent Labs  Lab 11/15/20 2103  AST 17  ALT 23  ALKPHOS 81  BILITOT 0.9  PROT 7.3  ALBUMIN 3.5   Recent Labs  Lab 11/15/20 2103  LIPASE 44   Recent Labs  Lab 11/15/20 2209  AMMONIA 13   Coagulation Profile: Recent Labs  Lab  11/15/20 2103  INR 1.1   Cardiac Enzymes: Recent Labs  Lab 11/15/20 2103  CKTOTAL 32*   Urine analysis:    Component Value Date/Time   COLORURINE YELLOW (A) 11/15/2020 2103   APPEARANCEUR HAZY (A) 11/15/2020 2103   APPEARANCEUR Clear 06/05/2016 1003   LABSPEC 1.024 11/15/2020 2103   PHURINE 5.0 11/15/2020 2103   GLUCOSEU >=500 (A) 11/15/2020 2103   HGBUR SMALL (A) 11/15/2020 2103   BILIRUBINUR NEGATIVE 11/15/2020 2103   BILIRUBINUR negative 02/16/2017 1628   BILIRUBINUR Negative 06/05/2016 1003   KETONESUR NEGATIVE 11/15/2020 2103   PROTEINUR 30 (A) 11/15/2020 2103   UROBILINOGEN 0.2 02/16/2017 1628   UROBILINOGEN 0.2 01/24/2010 1052   NITRITE NEGATIVE 11/15/2020 2103   LEUKOCYTESUR NEGATIVE 11/15/2020 2103   Dr. Tobie Poet [facility]  If 7PM-7AM, please contact overnight-coverage provider If 7AM-7PM, please contact day coverage provider [email or web address]  11/16/2020, 1:01 AM

## 2020-11-15 NOTE — Consult Note (Signed)
PHARMACY -  BRIEF ANTIBIOTIC NOTE   Pharmacy has received consult(s) for cefepime and vancomycin from an ED provider. Patient is also ordered metronidazole. The patient's profile has been reviewed for ht/wt/allergies/indication/available labs.    One time order(s) placed for  --Cefepime 2 g IV --Vancomycin 1 g IV  Further antibiotics/pharmacy consults should be ordered by admitting physician if indicated.                       Thank you, Benita Gutter 11/15/2020  9:19 PM

## 2020-11-15 NOTE — Sepsis Progress Note (Signed)
Sepsis protocol is being followed by [facility]. 

## 2020-11-15 NOTE — ED Provider Notes (Signed)
[facility] Emergency Department Provider Note  ____________________________________________   None    (approximate)  I have reviewed the triage vital signs and the nursing notes.   HISTORY  Chief Complaint Altered Mental Status    HPI Beth Stanley is a 70 y.o. female with diffuse large B-cell lymphoma who is status post stem cell transplant in 2018 who comes in with concerns for altered mental status.  According to EMS patient has been in the car for the last 5 hours.  Reportedly he would go to check in on her and that she was just sleeping and sometimes this happens to her.  However given it was likely better they called EMS and EMS got there she was awakened by sternal rub but otherwise she was tachycardic.  No alcohol or drug use.  Patient noted to have a fever of 102.2 by [place]   On review of records patient was admitted on 10/24-10/28.  Patient was noted to have acute metabolic encephalopathy secondary to COVID-pneumonia.  Patient initially came in confused and drowsy.  Patient's symptoms resolved with treatment for COVID, COPD, UTI and ended up being alert and oriented x4 patient was discharged on a course of Augmentin for concern for UTI.            Past Medical History:  Diagnosis Date   Anxiety    Arthritis    Diabetes ([place])    Heartburn    HTN (hypertension)    Non Hodgkin's lymphoma ([place]) 2004   Hx chemo tx's.     Patient Active Problem List   Diagnosis Date Noted   C. difficile colitis 10/01/2019   COVID-19 virus infection 87/56/4332   Acute metabolic encephalopathy 95/18/8416   Hypokalemia 09/29/2019   Encephalopathy acute 09/29/2019   Smoker 04/25/2014   Lymphoma ([place]) 02/03/2012   Hyperlipidemia 02/03/2012   Hypertension 02/03/2012   Reflux 02/03/2012   Depression 02/03/2012    Past Surgical History:  Procedure Laterality Date   TOTAL ABDOMINAL HYSTERECTOMY      Prior to Admission medications   Medication Sig Start  Date End Date Taking? Authorizing Provider  albuterol (VENTOLIN HFA) 108 (90 Base) MCG/ACT inhaler Inhale 1 puff into the lungs every 6 (six) hours as needed for wheezing. 09/02/19   [provider]  blood glucose meter kit and supplies KIT Test blood sugar once daily. Dx code: E11.9 06/27/15   [name], PA-C  ferrous sulfate 325 (65 FE) MG tablet Take 1 tablet by mouth 3 (three) times a week. Monday Wednesday Friday 01/14/19   [provider]  FREESTYLE LITE test strip TEST BLOOD SUGAR ONCE DAILY 09/19/16   [name], PA-C  ketorolac (ACULAR) 0.5 % ophthalmic solution Place 1 drop into the left eye 4 (four) times daily. 08/31/19   [provider]  Lancets (FREESTYLE) lancets TEST BLOOD GLUCOSE ONCE DAILY 07/28/16   [name], PA-C  Multiple Vitamin (MULTIVITAMIN) tablet Take 1 tablet by mouth daily.    [provider]  ondansetron (ZOFRAN) 4 MG tablet Take 4 mg by mouth every 8 (eight) hours as needed for nausea or vomiting.  09/27/19   [provider]  potassium chloride SA (KLOR-CON) 20 MEQ tablet Take 20 mEq by mouth daily. 07/28/19   [provider]  prednisoLONE acetate (PRED FORTE) 1 % ophthalmic suspension Place 1 drop into the left eye daily.  08/31/19   [provider]  predniSONE (DELTASONE) 10 MG tablet Take 5 mg  by mouth daily with breakfast.  08/26/19   [provider]  ramipril (ALTACE) 2.5 MG capsule Take 1 capsule (2.5 mg total) by mouth daily. 08/08/17   [name], PA-C  sertraline (ZOLOFT) 100 MG tablet TAKE ONE (1) TABLET EACH DAY Patient taking differently: Take 150 mg by mouth daily.  06/10/17   [name], PA-C  valACYclovir (VALTREX) 500 MG tablet Take 500 mg by mouth daily as needed. 06/15/19   [provider]    Allergies Aspirin, Other, and Tape  Family History  Problem Relation Age of Onset   Diabetes Mother    Lung cancer Sister    Lung cancer Brother    Kidney  cancer Brother        removed kidney    Lung cancer Brother    Prostate cancer Neg Hx    Bladder Cancer Neg Hx     Social History Social History   Tobacco Use   Smoking status: Every Day    Packs/day: 1.00    Types: Cigarettes   Smokeless tobacco: Never  Substance Use Topics   Alcohol use: Not Currently    Alcohol/week: 0.0 standard drinks    Comment: Wine ocass   Drug use: No      Review of Systems Unable to get full review of systems due to altered mental status ____________________________________________   PHYSICAL EXAM:  VITAL SIGNS: ED Triage Vitals [11/15/20 2102]  Enc Vitals Group     BP (!) 147/90     Pulse Rate (!) 120     Resp 14     Temp (!) 102.2 F (39 C)     Temp Source Oral     SpO2 96 %     Weight      Height      Head Circumference      Peak Flow      Pain Score      Pain Loc      Pain Edu?      Excl. in [place]?     Constitutional: Patient is unresponsive but does respond to sternal rub as well as wakes up with ammonia salts. Eyes: Conjunctivae are normal. EOMI. Head: Atraumatic. Nose: No congestion/rhinnorhea. Mouth/Throat: Mucous membranes are moist.   Neck: No stridor. Trachea Midline. FROM Cardiovascular: Tachycardic, regular rhythm. Grossly normal heart sounds.  Good peripheral circulation. Respiratory: Normal respiratory effort.  No retractions. Lungs CTAB. Gastrointestinal: Soft and nontender.  Slightly distended. No abdominal bruits.  Musculoskeletal: No lower extremity tenderness nor edema.  No joint effusions. Neurologic: Patient responds to pain seems to move all her extremities with ammonia salts but difficult to further assess her neurological due to altered mental status. Skin:  Skin is warm, dry and intact. No rash noted. Psychiatric: Mood and affect are normal. Speech and behavior are normal. GU: Deferred   ____________________________________________   LABS (all labs ordered are listed, but only abnormal results are  displayed)  Labs Reviewed  RESP PANEL BY RT-PCR (FLU A&B, COVID) ARPGX2  CULTURE, BLOOD (ROUTINE X 2)  CULTURE, BLOOD (ROUTINE X 2)  URINE CULTURE  LACTIC ACID, PLASMA  LACTIC ACID, PLASMA  COMPREHENSIVE METABOLIC PANEL  CBC WITH DIFFERENTIAL/PLATELET  PROTIME-INR  APTT  URINALYSIS, COMPLETE (UACMP) WITH MICROSCOPIC  BLOOD GAS, VENOUS  LIPASE, BLOOD  ETHANOL  URINE DRUG SCREEN, QUALITATIVE ([facility] ONLY)  CK   ____________________________________________   ED ECG REPORT I, Vanessa [place], the attending physician, personally viewed and interpreted this ECG.  Sinus tachycardia  rate of 111, no ST elevation, no T wave versions, normal intervals ____________________________________________  RADIOLOGY   Official radiology report(s): CT HEAD WO CONTRAST (5MM)  Result Date: 11/15/2020 CLINICAL DATA:  Found unresponsive. EXAM: CT HEAD WITHOUT CONTRAST TECHNIQUE: Contiguous axial images were obtained from the base of the skull through the vertex without intravenous contrast. COMPARISON:  September 29, 2019 FINDINGS: Brain: There is mild to moderate severity cerebral atrophy with widening of the extra-axial spaces and ventricular dilatation. There are areas of decreased attenuation within the white matter tracts of the supratentorial brain, consistent with microvascular disease changes. Vascular: No hyperdense vessel or unexpected calcification. Skull: Normal. Negative for fracture or focal lesion. Sinuses/Orbits: Mild frontal sinus, mild bilateral maxillary sinus and moderate severity bilateral ethmoid sinus mucosal thickening is seen. Other: None. IMPRESSION: 1. Generalized cerebral atrophy. 2. No acute intracranial abnormality. 3. Paranasal sinus disease. Electronically Signed   By: Virgina Norfolk M.D.   On: 11/15/2020 21:53   DG Chest Port 1 View  Result Date: 11/15/2020 CLINICAL DATA:  Fever, unresponsive EXAM: PORTABLE CHEST 1 VIEW COMPARISON:  03/05/2020 FINDINGS: Mild left lower  lobe opacity, likely atelectasis, less likely pneumonia. Right lung is clear. No pleural effusion or pneumothorax. Heart is normal in size.  Thoracic aortic atherosclerosis. Right chest power port terminates in the upper right atrium. IMPRESSION: Mild left lower lobe opacity, likely atelectasis, less likely pneumonia. Electronically Signed   By: Julian Hy M.D.   On: 11/15/2020 21:19   CT CHEST ABDOMEN PELVIS WO CONTRAST  Result Date: 11/15/2020 CLINICAL DATA:  Found unresponsive. EXAM: CT CHEST, ABDOMEN AND PELVIS WITHOUT CONTRAST TECHNIQUE: Multidetector CT imaging of the chest, abdomen and pelvis was performed following the standard protocol without IV contrast. COMPARISON:  September 29, 2019 FINDINGS: CT CHEST FINDINGS Cardiovascular: A right-sided venous Port-A-Cath is in place. Moderate severity calcification of the aortic arch is noted without evidence of aortic aneurysm. Normal heart size. No pericardial effusion. Mediastinum/Nodes: No enlarged mediastinal, hilar, or axillary lymph nodes. Thyroid gland, trachea, and esophagus demonstrate no significant findings. Lungs/Pleura: Mild-to-moderate severity central lobular emphysema is seen with stable, marked severity, partially calcified areas of scarring and/or atelectasis are seen within the bilateral apices. Mild to moderate severity infiltrate is seen within the anterior aspect of the left lung base. There is no evidence of a pleural effusion or pneumothorax. Musculoskeletal: Multilevel degenerative changes are seen throughout the thoracic spine. CT ABDOMEN PELVIS FINDINGS Hepatobiliary: No focal liver abnormality is seen. No gallstones, gallbladder wall thickening, or biliary dilatation. Pancreas: Unremarkable. No pancreatic ductal dilatation or surrounding inflammatory changes. Spleen: Normal in size without focal abnormality. Adrenals/Urinary Tract: Adrenal glands are unremarkable. Kidneys are normal, without renal calculi, focal lesion, or  hydronephrosis. Bladder is unremarkable. Stomach/Bowel: Stomach is within normal limits. Appendix appears normal. Stool is seen throughout the large bowel. No evidence of bowel wall thickening, distention, or inflammatory changes. Vascular/Lymphatic: Aortic atherosclerosis. No enlarged abdominal or pelvic lymph nodes. Reproductive: Status post hysterectomy. No adnexal masses. Other: No abdominal wall hernia or abnormality. No abdominopelvic ascites. Musculoskeletal: Multilevel degenerative changes are seen throughout the lumbar spine. IMPRESSION: 1. Mild-to-moderate severity left lower lobe infiltrate. 2. Emphysematous lung disease with stable, marked severity, partially calcified areas of biapical scarring and/or atelectasis. 3. No acute or active process within the abdomen or pelvis. 4. Aortic atherosclerosis. Aortic Atherosclerosis (ICD10-I70.0) and Emphysema (ICD10-J43.9). Electronically Signed   By: Virgina Norfolk M.D.   On: 11/15/2020 22:04    ____________________________________________   PROCEDURES  Procedure(s) performed (  including Critical Care):  .1-3 Lead EKG Interpretation Performed by: Vanessa [place], MD Authorized by: Vanessa Witt, MD     Interpretation: abnormal     ECG rate:  120s   ECG rate assessment: tachycardic     Rhythm: sinus tachycardia     Ectopy: none     Conduction: normal   .Critical Care Performed by: Vanessa [place], MD Authorized by: Vanessa [place], MD   Critical care provider statement:    Critical care time (minutes):  45   Critical care was necessary to treat or prevent imminent or life-threatening deterioration of the following conditions:  Sepsis   Critical care was time spent personally by me on the following activities:  Blood draw for specimens, development of treatment plan with patient or surrogate, discussions with primary provider, evaluation of patient's response to treatment, examination of patient, obtaining history from patient or surrogate,  ordering and performing treatments and interventions, ordering and review of laboratory studies, ordering and review of radiographic studies, pulse oximetry and re-evaluation of patient's condition   Care discussed with: admitting provider     ____________________________________________   INITIAL IMPRESSION / ASSESSMENT AND PLAN / ED COURSE  Beth Stanley was evaluated in Emergency Department on 11/15/2020 for the symptoms described in the history of present illness. She was evaluated in the context of the global COVID-19 pandemic, which necessitated consideration that the patient might be at risk for infection with the SARS-CoV-2 virus that causes COVID-19. Institutional protocols and algorithms that pertain to the evaluation of patients at risk for COVID-19 are in a state of rapid change based on information released by regulatory bodies including the CDC and federal and state organizations. These policies and algorithms were followed during the patient's care in the ED.    Patient comes in acutely ill.  Patient is tachycardic and febrile meeting sepsis criteria will start on broad-spectrum antibiotics.  Patient had recent admission for COVID, UTI.  Patient's abdomen does feel little distended.  Will get CT head in case patient needs LP for meningitis rule out, CT chest to evaluate for pneumonia and CT abdomen to evaluate for any infected kidney stone that could have been missed with the UTI.  Sepsis alert was called.  Discussed further with family who states that she has a history of lymphoma and is being treated currently with radiation on her back for lesions there.  She sounds like she was her normal self this morning and had seen a doctor this morning with normal temperature and then acutely started feeling more tired and then became more altered this afternoon.  Patient is scan concerning for pneumonia.  This could be hospital-acquired pneumonia from her prior admission.  Also consider the  possibly of meningitis given how she is altered but given patient has a history of cancer in her spine I do not feel like an LP would be a good idea given the possible seeding of the cancer.  We will add on a MRI of her brain and spine to evaluate for any abscess versus cancer.  Patient is now more responsive after treatment.  She is able to wake up to name but she is not really verbally communicating much.  At this time she is protecting her airway.  I discussed the case both with her husband and with her daughter and updated both of them.  Will admit to the hospital team for further work-up         ____________________________________________  FINAL CLINICAL IMPRESSION(S) / ED DIAGNOSES   Final diagnoses:  Fever  Altered mental status, unspecified altered mental status type  Sepsis, due to unspecified organism, unspecified whether acute organ dysfunction present ([place])      MEDICATIONS GIVEN DURING THIS VISIT:  Medications  lactated ringers infusion (has no administration in time range)  ceFEPIme (MAXIPIME) 2 g in sodium chloride 0.9 % 100 mL IVPB (has no administration in time range)  metroNIDAZOLE (FLAGYL) IVPB 500 mg (has no administration in time range)  vancomycin (VANCOCIN) IVPB 1000 mg/200 mL premix (has no administration in time range)  sodium chloride 0.9 % bolus 1,000 mL (has no administration in time range)  acetaminophen (OFIRMEV) IV 1,000 mg (has no administration in time range)     ED Discharge Orders     None        Note:  This document was prepared using Dragon voice recognition software and may include unintentional dictation errors.    Vanessa Cortland, MD 11/15/20 [phone]

## 2020-11-15 NOTE — ED Triage Notes (Signed)
Pt came in EMS; pt found in her car was there for the last 5 hours before ems got her; pt fever 102.2 ORAL- pt husband satted he checked on her frequently but just left her there- pt somewhat responsive to sternal rub- pt woke up to [place] salts but went back to sleep- pt husband on the way. EKG NSR pt now 123 HR-

## 2020-11-16 ENCOUNTER — Encounter: Payer: Self-pay | Admitting: Internal Medicine

## 2020-11-16 DIAGNOSIS — R4182 Altered mental status, unspecified: Secondary | ICD-10-CM | POA: Diagnosis present

## 2020-11-16 DIAGNOSIS — N390 Urinary tract infection, site not specified: Secondary | ICD-10-CM | POA: Diagnosis present

## 2020-11-16 DIAGNOSIS — F32A Depression, unspecified: Secondary | ICD-10-CM | POA: Diagnosis present

## 2020-11-16 DIAGNOSIS — Z833 Family history of diabetes mellitus: Secondary | ICD-10-CM | POA: Diagnosis not present

## 2020-11-16 DIAGNOSIS — G319 Degenerative disease of nervous system, unspecified: Secondary | ICD-10-CM | POA: Diagnosis not present

## 2020-11-16 DIAGNOSIS — Z91048 Other nonmedicinal substance allergy status: Secondary | ICD-10-CM | POA: Diagnosis not present

## 2020-11-16 DIAGNOSIS — G9341 Metabolic encephalopathy: Secondary | ICD-10-CM | POA: Diagnosis present

## 2020-11-16 DIAGNOSIS — Z79899 Other long term (current) drug therapy: Secondary | ICD-10-CM | POA: Diagnosis not present

## 2020-11-16 DIAGNOSIS — E785 Hyperlipidemia, unspecified: Secondary | ICD-10-CM | POA: Diagnosis present

## 2020-11-16 DIAGNOSIS — A419 Sepsis, unspecified organism: Secondary | ICD-10-CM | POA: Diagnosis present

## 2020-11-16 DIAGNOSIS — M5136 Other intervertebral disc degeneration, lumbar region: Secondary | ICD-10-CM | POA: Diagnosis not present

## 2020-11-16 DIAGNOSIS — U071 COVID-19: Secondary | ICD-10-CM | POA: Diagnosis present

## 2020-11-16 DIAGNOSIS — M5030 Other cervical disc degeneration, unspecified cervical region: Secondary | ICD-10-CM | POA: Diagnosis not present

## 2020-11-16 DIAGNOSIS — E119 Type 2 diabetes mellitus without complications: Secondary | ICD-10-CM | POA: Diagnosis present

## 2020-11-16 DIAGNOSIS — Z7984 Long term (current) use of oral hypoglycemic drugs: Secondary | ICD-10-CM | POA: Diagnosis not present

## 2020-11-16 DIAGNOSIS — Z7952 Long term (current) use of systemic steroids: Secondary | ICD-10-CM | POA: Diagnosis not present

## 2020-11-16 DIAGNOSIS — Z886 Allergy status to analgesic agent status: Secondary | ICD-10-CM | POA: Diagnosis not present

## 2020-11-16 DIAGNOSIS — F419 Anxiety disorder, unspecified: Secondary | ICD-10-CM | POA: Diagnosis present

## 2020-11-16 DIAGNOSIS — J439 Emphysema, unspecified: Secondary | ICD-10-CM | POA: Diagnosis present

## 2020-11-16 DIAGNOSIS — J189 Pneumonia, unspecified organism: Secondary | ICD-10-CM | POA: Diagnosis present

## 2020-11-16 DIAGNOSIS — D490 Neoplasm of unspecified behavior of digestive system: Secondary | ICD-10-CM | POA: Diagnosis present

## 2020-11-16 DIAGNOSIS — C8330 Diffuse large B-cell lymphoma, unspecified site: Secondary | ICD-10-CM | POA: Diagnosis present

## 2020-11-16 DIAGNOSIS — I10 Essential (primary) hypertension: Secondary | ICD-10-CM | POA: Diagnosis present

## 2020-11-16 DIAGNOSIS — F17210 Nicotine dependence, cigarettes, uncomplicated: Secondary | ICD-10-CM | POA: Diagnosis present

## 2020-11-16 DIAGNOSIS — C7951 Secondary malignant neoplasm of bone: Secondary | ICD-10-CM | POA: Diagnosis present

## 2020-11-16 DIAGNOSIS — Z9484 Stem cells transplant status: Secondary | ICD-10-CM | POA: Diagnosis not present

## 2020-11-16 DIAGNOSIS — Z90710 Acquired absence of both cervix and uterus: Secondary | ICD-10-CM | POA: Diagnosis not present

## 2020-11-16 DIAGNOSIS — Z91040 Latex allergy status: Secondary | ICD-10-CM | POA: Diagnosis not present

## 2020-11-16 LAB — CBC
HCT: 35.8 % — ABNORMAL LOW (ref 36.0–46.0)
Hemoglobin: 12 g/dL (ref 12.0–15.0)
MCH: 32.7 pg (ref 26.0–34.0)
MCHC: 33.5 g/dL (ref 30.0–36.0)
MCV: 97.5 fL (ref 80.0–100.0)
Platelets: 116 10*3/uL — ABNORMAL LOW (ref 150–400)
RBC: 3.67 MIL/uL — ABNORMAL LOW (ref 3.87–5.11)
RDW: 14.5 % (ref 11.5–15.5)
WBC: 5.8 10*3/uL (ref 4.0–10.5)
nRBC: 0 % (ref 0.0–0.2)

## 2020-11-16 LAB — BASIC METABOLIC PANEL
Anion gap: 8 (ref 5–15)
BUN: 17 mg/dL (ref 8–23)
CO2: 28 mmol/L (ref 22–32)
Calcium: 8.2 mg/dL — ABNORMAL LOW (ref 8.9–10.3)
Chloride: 101 mmol/L (ref 98–111)
Creatinine, Ser: 0.69 mg/dL (ref 0.44–1.00)
GFR, Estimated: >60 mL/min (ref >60)
Glucose, Bld: 152 mg/dL — ABNORMAL HIGH (ref 70–99)
Potassium: 3.8 mmol/L (ref 3.5–5.1)
Sodium: 137 mmol/L (ref 135–145)

## 2020-11-16 LAB — PROCALCITONIN: Procalcitonin: 0.16 ng/mL

## 2020-11-16 LAB — RESPIRATORY PANEL BY PCR

## 2020-11-16 LAB — CBG MONITORING, ED
Glucose-Capillary: 142 mg/dL — ABNORMAL HIGH (ref 70–99)
Glucose-Capillary: 188 mg/dL — ABNORMAL HIGH (ref 70–99)
Glucose-Capillary: 235 mg/dL — ABNORMAL HIGH (ref 70–99)
Glucose-Capillary: 188 mg/dL — ABNORMAL HIGH (ref 70–99)

## 2020-11-16 LAB — URINE CULTURE: Culture: NO GROWTH

## 2020-11-16 LAB — HEMOGLOBIN A1C
Hgb A1c MFr Bld: 8 % — ABNORMAL HIGH (ref 4.8–5.6)
Mean Plasma Glucose: 182.9 mg/dL

## 2020-11-16 LAB — HIV ANTIBODY (ROUTINE TESTING W REFLEX): HIV Screen 4th Generation wRfx: NONREACTIVE

## 2020-11-16 LAB — CORTISOL-AM, BLOOD: Cortisol - AM: 32.2 ug/dL — ABNORMAL HIGH (ref 6.7–22.6)

## 2020-11-16 MED ORDER — MOMETASONE FURO-FORMOTEROL FUM 200-5 MCG/ACT IN AERO
2.0000 | INHALATION_SPRAY | Freq: Two times a day (BID) | RESPIRATORY_TRACT | Status: DC
  Administered 2020-11-17 – 2020-11-18 (×3): 2 via RESPIRATORY_TRACT
  Filled 2020-11-16: qty 8.8

## 2020-11-16 MED ORDER — SODIUM CHLORIDE 0.9 % IV SOLN
2.0000 g | Freq: Two times a day (BID) | INTRAVENOUS | Status: DC
  Administered 2020-11-16: 2 g via INTRAVENOUS
  Filled 2020-11-16: qty 2

## 2020-11-16 MED ORDER — SODIUM CHLORIDE 0.9 % IV SOLN
2.0000 g | Freq: Three times a day (TID) | INTRAVENOUS | Status: DC
  Administered 2020-11-16 – 2020-11-18 (×5): 2 g via INTRAVENOUS
  Filled 2020-11-16 (×8): qty 2

## 2020-11-16 MED ORDER — ALBUTEROL SULFATE HFA 108 (90 BASE) MCG/ACT IN AERS
1.0000 | INHALATION_SPRAY | Freq: Four times a day (QID) | RESPIRATORY_TRACT | Status: DC | PRN
  Filled 2020-11-16: qty 6.7

## 2020-11-16 MED ORDER — DEXAMETHASONE SODIUM PHOSPHATE 4 MG/ML IJ SOLN
4.0000 mg | Freq: Three times a day (TID) | INTRAMUSCULAR | Status: DC
  Administered 2020-11-16: 4 mg via INTRAVENOUS
  Filled 2020-11-16: qty 1

## 2020-11-16 MED ORDER — VANCOMYCIN HCL IN DEXTROSE 1-5 GM/200ML-% IV SOLN
1000.0000 mg | INTRAVENOUS | Status: DC
Start: 2020-11-16 — End: 2020-11-16

## 2020-11-16 MED ORDER — DEXAMETHASONE SODIUM PHOSPHATE 4 MG/ML IJ SOLN
4.0000 mg | INTRAMUSCULAR | Status: DC
  Administered 2020-11-17 – 2020-11-18 (×2): 4 mg via INTRAVENOUS
  Filled 2020-11-16 (×2): qty 1

## 2020-11-16 MED ORDER — NICOTINE 14 MG/24HR TD PT24: 14.0000 mg | MEDICATED_PATCH | Freq: Every day | TRANSDERMAL | Status: DC | PRN

## 2020-11-16 MED ORDER — SODIUM CHLORIDE 0.9 % IV SOLN
INTRAVENOUS | Status: DC
Start: 2020-11-16 — End: 2020-11-18

## 2020-11-16 MED ORDER — VANCOMYCIN HCL 1250 MG/250ML IV SOLN
1250.0000 mg | INTRAVENOUS | Status: DC
Start: 2020-11-16 — End: 2020-11-17
  Administered 2020-11-16: 1250 mg via INTRAVENOUS
  Filled 2020-11-16 (×2): qty 250

## 2020-11-16 NOTE — Progress Notes (Signed)
Pharmacy Antibiotic Note  Beth Stanley is a 70 y.o. female admitted on 11/15/2020 with sepsis.  Pharmacy has been consulted for Cefepime and Vancomycin dosing.  Plan: Cefepime 2 gm q12h per indication and renal fxn.  Vancomycin Pt given initial dose of Vancomycin 1 gm. Vancomycin 1000 mg IV Q 24 hrs.  Goal AUC 400-550. Expected AUC: 456.3 SCr used: 0.91  Pharmacy will continue to follow and will adjust abx dosing when warranted.  Height: 5\' 6"  (167.6 cm) Weight: 62 kg (136 lb 11 oz) IBW/kg (Calculated) : 59.3  Temp (24hrs), Avg:101.5 F (38.6 C), Min:99 F (37.2 C), Max:103.2 F (39.6 C)  Recent Labs  Lab 11/15/20 2103 11/15/20 2209  WBC 9.1  --   CREATININE 0.91  --   LATICACIDVEN 1.9 1.4    Estimated Creatinine Clearance: 53.9 mL/min (by C-G formula based on SCr of 0.91 mg/dL).    Allergies  Allergen Reactions   Aspirin     Stomach cramp   Latex Swelling   Other    Tape Rash    Makes Whelps on Skin Makes Whelps on Skin    Antimicrobials this admission: 11/3 Cefepime >>  11/3 Vancomycin >>  11/3 Flagyl >> x 1 dose  Microbiology results: 11/3 BCx: Pending 11/3 UCx: Pending   Thank you for allowing pharmacy to be a part of this patient's care.  Renda Rolls, PharmD, [facility] 11/16/2020 12:35 AM

## 2020-11-16 NOTE — Progress Notes (Signed)
PROGRESS NOTE    Beth Stanley  JOA:[id number] DOB: 08-30-1950 DOA: 11/15/2020 PCP: Imagene Riches, NP    Brief Narrative:  70 y.o. 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   Spouse reports that upon returning home from the oncology office, patient states that she was tired.  So he allowed her to sleep in the car.  He reports that patient slept from approximately 3 pm - 7 pm, she was sitting in the car, with car turned off, outside and not in an enclosed space like a garage.  It was noted in the chart the patient was unresponsive however patient was just sleeping and difficult to arouse.   Spouse at bedside reports that patient took her Decadron prior to seeing her oncologist on 11/15/20.  He further reports that her vitals were noted to be normal at the oncologist office visit.  He did endorse that patient just feels overall generalized weakness prior to going to the doctor however they attribute this to her chronic medical issues.  He denies any vomiting.  Encephalopathy has been persistent.  Started to slowly improve on broad-spectrum IV antibiotics.  No clear source of infection.  Suspect possible pneumonia.  Also initial suspicion for adrenal insufficiency however a.m. cortisol 32 making this less likely.   Assessment & Plan:   Principal Problem:   Altered mental status Active Problems:   Lymphoma ([place])   Hyperlipidemia   Hypertension   Depression   Smoker   COVID-19 virus infection   Acute metabolic encephalopathy   Encephalopathy acute   Sepsis ([place])  Acute metabolic encephalopathy Sepsis secondary to pneumonia Etiology of lethargy is unclear Sepsis criteria met with fever, infectious focus, tachycardia, tachypnea Suspect infection Given immunocompromise state cannot rule out Polypharmacy also  consideration Vital signs stable.  Patient protecting airway MRI imaging survey negative for acute source.  T2-T3 lesion likely unrelated to current presentation No meningeal signs, lumbar puncture deferred RVP negative Plan: Continue broad-spectrum antibiotics Follow blood and urine cultures Telemetry monitoring Aspiration and fall precautions Daily steroids Monitor vitals and fever curve  Non-insulin-dependent diabetes mellitus Sign scale insulin with bedtime coverage  Diffuse large B-cell lymphoma Left parotid gland tumor Possible metastatic deposit at T2-T3 Patient has a very complex oncologic history.  [name] majority of her care at [facility].  Patient and husband are local to [place].  No acute oncology issues at this time.  We will reach out to our oncology service if any issues arise.    DVT prophylaxis: SQ heparin Code Status: Full Family Communication: Husband at bedside 11/4 Disposition Plan: Status is: Inpatient  Remains inpatient appropriate because: Sepsis, possible pulmonary source, acute metabolic encephalopathy of unclear etiology.       Level of care: Med-Surg  Consultants:  None  Procedures:  None  Antimicrobials: Vancomycin Cefepime   Subjective: Seen and examined.  Patient lethargic.  Unable to provide history.  Husband at bedside provides majority of history.  He is a very disjointed.  Objective: Vitals:   11/16/20 1100 11/16/20 1200 11/16/20 1330 11/16/20 1400  BP: 133/77 (!) 121/94 135/69 109/64  Pulse: 94     Resp: '14 14 14 12  ' Temp:      TempSrc:      SpO2: 94% 93%    Weight:      Height:  Intake/Output Summary (Last 24 hours) at 11/16/2020 1521 Last data filed at 11/15/2020 2337 Gross per 24 hour  Intake 1493.13 ml  Output --  Net 1493.13 ml   Filed Weights   11/15/20 2104  Weight: 62 kg    Examination:  General exam: Lethargic.  Unable to provide history Respiratory system: Poor  respiratory effort.  Bibasilar crackles.  Normal work of breathing.  2 L Cardiovascular system: S1-S2, RRR, no murmurs, no pedal edema Gastrointestinal system: Soft, NT/ND, normal bowel sounds Central nervous system: Lethargic.  Oriented to person only.  No focal deficits Extremities: Diffusely decreased power bilaterally Skin: No rashes, lesions or ulcers Psychiatry: Judgement and insight appear impaired. Mood & affect flattened.     Data Reviewed: I have personally reviewed following labs and imaging studies  CBC: Recent Labs  Lab 11/15/20 2103 11/16/20 0429  WBC 9.1 5.8  NEUTROABS 7.0  --   HGB 13.6 12.0  HCT 41.4 35.8*  MCV 96.1 97.5  PLT 140* 488*   Basic Metabolic Panel: Recent Labs  Lab 11/15/20 2103 11/16/20 0429  NA 137 137  K 4.0 3.8  CL 97* 101  CO2 27 28  GLUCOSE 170* 152*  BUN 18 17  CREATININE 0.91 0.69  CALCIUM 9.0 8.2*   GFR: Estimated Creatinine Clearance: 61.3 mL/min (by C-G formula based on SCr of 0.69 mg/dL). Liver Function Tests: Recent Labs  Lab 11/15/20 2103  AST 17  ALT 23  ALKPHOS 81  BILITOT 0.9  PROT 7.3  ALBUMIN 3.5   Recent Labs  Lab 11/15/20 2103  LIPASE 44   Recent Labs  Lab 11/15/20 2209  AMMONIA 13   Coagulation Profile: Recent Labs  Lab 11/15/20 2103  INR 1.1   Cardiac Enzymes: Recent Labs  Lab 11/15/20 2103  CKTOTAL 32*   BNP (last 3 results) No results for input(s): PROBNP in the last 8760 hours. HbA1C: Recent Labs    11/15/20 2103  HGBA1C 8.0*   CBG: Recent Labs  Lab 11/16/20 0752 11/16/20 1232  GLUCAP 142* 188*   Lipid Profile: No results for input(s): CHOL, HDL, LDLCALC, TRIG, CHOLHDL, LDLDIRECT in the last 72 hours. Thyroid Function Tests: No results for input(s): TSH, T4TOTAL, FREET4, T3FREE, THYROIDAB in the last 72 hours. Anemia Panel: No results for input(s): VITAMINB12, FOLATE, FERRITIN, TIBC, IRON, RETICCTPCT in the last 72 hours. Sepsis Labs: Recent Labs  Lab 11/15/20 2103  11/15/20 2209 11/16/20 0429  PROCALCITON  --  <0.10 0.16  LATICACIDVEN 1.9 1.4  --     Recent Results (from the past 240 hour(s))  Resp Panel by RT-PCR (Flu A&B, Covid) Nasopharyngeal Swab     Status: Abnormal   Collection Time: 11/15/20  9:03 PM   Specimen: Nasopharyngeal Swab; Nasopharyngeal(NP) swabs in vial transport medium  Result Value Ref Range Status   SARS Coronavirus 2 by RT PCR POSITIVE (A) NEGATIVE Final    Comment: RESULT CALLED TO, READ BACK BY AND VERIFIED WITH: TIA BULLOCK RN [id number] 11/15/20 HNM (NOTE) SARS-CoV-2 target nucleic acids are DETECTED.  The SARS-CoV-2 RNA is generally detectable in upper respiratory specimens during the acute phase of infection. Positive results are indicative of the presence of the identified virus, but do not rule out bacterial infection or co-infection with other pathogens not detected by the test. Clinical correlation with patient history and other diagnostic information is necessary to determine patient infection status. The expected result is Negative.  Fact Sheet for Patients: EntrepreneurPulse.com.au  Fact Sheet for Healthcare Providers: IncredibleEmployment.be  This test is not yet approved or cleared by the [place] and  has been authorized for detection and/or diagnosis of SARS-CoV-2 by FDA under an Emergency Use Authorization (EUA).  This EUA will remain in effect (meaning this test can be u sed) for the duration of  the COVID-19 declaration under Section 564(b)(1) of the Act, 21 U.S.C. section 360bbb-3(b)(1), unless the authorization is terminated or revoked sooner.     Influenza A by PCR NEGATIVE NEGATIVE Final   Influenza B by PCR NEGATIVE NEGATIVE Final    Comment: (NOTE) The Xpert Xpress SARS-CoV-2/FLU/RSV plus assay is intended as an aid in the diagnosis of influenza from Nasopharyngeal swab specimens and should not be used as a sole basis for treatment. Nasal washings  and aspirates are unacceptable for Xpert Xpress SARS-CoV-2/FLU/RSV testing.  Fact Sheet for Patients: EntrepreneurPulse.com.au  Fact Sheet for Healthcare Providers: IncredibleEmployment.be  This test is not yet approved or cleared by the [place] FDA and has been authorized for detection and/or diagnosis of SARS-CoV-2 by FDA under an Emergency Use Authorization (EUA). This EUA will remain in effect (meaning this test can be used) for the duration of the COVID-19 declaration under Section 564(b)(1) of the Act, 21 U.S.C. section 360bbb-3(b)(1), unless the authorization is terminated or revoked.  Performed at [facility], [address] [place], [place] [address]   Blood Culture (routine x 2)     Status: None (Preliminary result)   Collection Time: 11/15/20  9:03 PM   Specimen: BLOOD  Result Value Ref Range Status   Specimen Description BLOOD LEFT ASSIST CONTROL  Final   Special Requests   Final    BOTTLES DRAWN AEROBIC AND ANAEROBIC Blood Culture results may not be optimal due to an inadequate volume of blood received in culture bottles   Culture   Final    NO GROWTH < 12 HOURS Performed at [facility], [address] [place], [place] [address]    Report Status PENDING  Incomplete  Blood Culture (routine x 2)     Status: None (Preliminary result)   Collection Time: 11/15/20 10:09 PM   Specimen: BLOOD  Result Value Ref Range Status   Specimen Description BLOOD LEFT HAND  Final   Special Requests   Final    BOTTLES DRAWN AEROBIC AND ANAEROBIC Blood Culture adequate volume   Culture   Final    NO GROWTH < 12 HOURS Performed at [facility], [address] [place], [place] [address]    Report Status PENDING  Incomplete  Respiratory (~20 pathogens) panel by PCR     Status: None   Collection Time: 11/16/20  1:45 AM   Specimen: Nasopharyngeal Swab; Respiratory  Result Value Ref Range Status   Adenovirus  NOT DETECTED NOT DETECTED Final   Coronavirus 229E NOT DETECTED NOT DETECTED Final    Comment: (NOTE) The Coronavirus on the Respiratory Panel, DOES NOT test for the novel  Coronavirus (2019 nCoV)    Coronavirus HKU1 NOT DETECTED NOT DETECTED Final   Coronavirus NL63 NOT DETECTED NOT DETECTED Final   Coronavirus OC43 NOT DETECTED NOT DETECTED Final   Metapneumovirus NOT DETECTED NOT DETECTED Final   Rhinovirus / Enterovirus NOT DETECTED NOT DETECTED Final   Influenza A NOT DETECTED NOT DETECTED Final   Influenza B NOT DETECTED NOT DETECTED Final   Parainfluenza Virus 1 NOT DETECTED NOT DETECTED Final   Parainfluenza Virus 2 NOT DETECTED NOT DETECTED Final   Parainfluenza Virus 3 NOT DETECTED  NOT DETECTED Final   Parainfluenza Virus 4 NOT DETECTED NOT DETECTED Final   Respiratory Syncytial Virus NOT DETECTED NOT DETECTED Final   Bordetella pertussis NOT DETECTED NOT DETECTED Final   Bordetella Parapertussis NOT DETECTED NOT DETECTED Final   Chlamydophila pneumoniae NOT DETECTED NOT DETECTED Final   Mycoplasma pneumoniae NOT DETECTED NOT DETECTED Final    Comment: Performed at [facility], [place] [address], [place], [place] [address]         Radiology Studies: CT HEAD WO CONTRAST (5MM)  Result Date: 11/15/2020 CLINICAL DATA:  Found unresponsive. EXAM: CT HEAD WITHOUT CONTRAST TECHNIQUE: Contiguous axial images were obtained from the base of the skull through the vertex without intravenous contrast. COMPARISON:  September 29, 2019 FINDINGS: Brain: There is mild to moderate severity cerebral atrophy with widening of the extra-axial spaces and ventricular dilatation. There are areas of decreased attenuation within the white matter tracts of the supratentorial brain, consistent with microvascular disease changes. Vascular: No hyperdense vessel or unexpected calcification. Skull: Normal. Negative for fracture or focal lesion. Sinuses/Orbits: Mild frontal sinus, mild bilateral maxillary  sinus and moderate severity bilateral ethmoid sinus mucosal thickening is seen. Other: None. IMPRESSION: 1. Generalized cerebral atrophy. 2. No acute intracranial abnormality. 3. Paranasal sinus disease. Electronically Signed   By: Virgina Norfolk M.D.   On: 11/15/2020 21:53   MR Brain W and Wo Contrast  Result Date: 11/16/2020 CLINICAL DATA:  Found unresponsive EXAM: MRI HEAD WITHOUT AND WITH CONTRAST TECHNIQUE: Multiplanar, multiecho pulse sequences of the brain and surrounding structures were obtained without and with intravenous contrast. CONTRAST:  64m GADAVIST GADOBUTROL 1 MMOL/ML IV SOLN COMPARISON:  09/30/2019 FINDINGS: Brain: No acute infarct, mass effect or extra-axial collection. No acute or chronic hemorrhage. Confluent hyperintense T2-weighted white matter signal. Generalized volume loss without a clear lobar predilection. The midline structures are normal. Vascular: Major flow voids are preserved. Skull and upper cervical spine: Normal calvarium and skull base. Visualized upper cervical spine and soft tissues are normal. Sinuses/Orbits:No paranasal sinus fluid levels or advanced mucosal thickening. No mastoid or middle ear effusion. Normal orbits. IMPRESSION: 1. No acute intracranial abnormality. 2. Confluent hyperintense T2-weighted white matter signal, most consistent with chronic microvascular ischemia. 3. Generalized volume loss without a clear lobar predilection. Cerebral Atrophy (ICD10-G31.9). Electronically Signed   By: KUlyses JarredM.D.   On: 11/16/2020 01:53   MR Cervical Spine W or Wo Contrast  Result Date: 11/16/2020 CLINICAL DATA:  Found unresponsive EXAM: MRI CERVICAL, THORACIC AND LUMBAR SPINE WITHOUT AND WITH CONTRAST TECHNIQUE: Multiplanar and multiecho pulse sequences of the cervical spine, to include the craniocervical junction and cervicothoracic junction, and thoracic and lumbar spine, were obtained without and with intravenous contrast. CONTRAST:  670mGADAVIST GADOBUTROL  1 MMOL/ML IV SOLN COMPARISON:  None. FINDINGS: MRI CERVICAL SPINE FINDINGS Alignment: Physiologic. Vertebrae: No fracture, evidence of discitis, or bone lesion. Cord: Normal signal and morphology. Posterior Fossa, vertebral arteries, paraspinal tissues: Negative. Disc levels: C2-3: Unremarkable. C3-4: Small central disc protrusion without stenosis. C4-5: Left facet hypertrophy.  No stenosis. C5-6: Small right subarticular disc protrusion indenting the ventral spinal cord. No spinal canal stenosis. C6-7: Small disc bulge narrowing the ventral thecal sac. Mild spinal canal stenosis. C7-T1: Unremarkable MRI THORACIC SPINE FINDINGS Alignment:  Physiologic. Vertebrae: There is a paraspinal mass on the left at T2-3 that also involves the left half of T2 vertebral body and the left pedicle. The lesion is diffusely contrast-enhancing. Otherwise, the thoracic vertebral bodies are normal. Cord:  Normal signal and morphology.  No epidural collection. Paraspinal and other soft tissues: Biapical pulmonary scarring. Left basilar opacity. Disc levels: No spinal canal or neural foraminal stenosis. MRI LUMBAR SPINE FINDINGS Segmentation:  Standard Alignment:  Grade 1 anterolisthesis at L4-5 Vertebrae:  No fracture, evidence of discitis, or bone lesion. Conus medullaris and cauda equina: Conus extends to the L1 level. Conus and cauda equina appear normal. Paraspinal and other soft tissues: Negative Disc levels: L1-2: Normal. L2-3: Small disc bulge without stenosis. L3-4: Small disc bulge and mild facet hypertrophy without stenosis. L4-5: Small disc bulge and moderate facet hypertrophy with mild spinal canal stenosis and mild left foraminal stenosis. L5-S1: Left asymmetric disc bulge and mild facet hypertrophy without stenosis. IMPRESSION: 1. No acute abnormality of the cervical, thoracic or lumbar spine. 2. Left paraspinal mass at T2-3 with involvement of the left pedicle. This could indicate extra medullary hematopoiesis, a nerve  sheath tumor or a metastasis from an unknown primary. The etiology is unclear and PET/CT may be helpful in further assessing the probability of metastatic disease. 3. Mild cervical and lumbar degenerative disc disease with mild spinal canal stenosis at C6-7. Electronically Signed   By: Ulyses Jarred M.D.   On: 11/16/2020 02:14   MR THORACIC SPINE W WO CONTRAST  Result Date: 11/16/2020 CLINICAL DATA:  Found unresponsive EXAM: MRI CERVICAL, THORACIC AND LUMBAR SPINE WITHOUT AND WITH CONTRAST TECHNIQUE: Multiplanar and multiecho pulse sequences of the cervical spine, to include the craniocervical junction and cervicothoracic junction, and thoracic and lumbar spine, were obtained without and with intravenous contrast. CONTRAST:  43m GADAVIST GADOBUTROL 1 MMOL/ML IV SOLN COMPARISON:  None. FINDINGS: MRI CERVICAL SPINE FINDINGS Alignment: Physiologic. Vertebrae: No fracture, evidence of discitis, or bone lesion. Cord: Normal signal and morphology. Posterior Fossa, vertebral arteries, paraspinal tissues: Negative. Disc levels: C2-3: Unremarkable. C3-4: Small central disc protrusion without stenosis. C4-5: Left facet hypertrophy.  No stenosis. C5-6: Small right subarticular disc protrusion indenting the ventral spinal cord. No spinal canal stenosis. C6-7: Small disc bulge narrowing the ventral thecal sac. Mild spinal canal stenosis. C7-T1: Unremarkable MRI THORACIC SPINE FINDINGS Alignment:  Physiologic. Vertebrae: There is a paraspinal mass on the left at T2-3 that also involves the left half of T2 vertebral body and the left pedicle. The lesion is diffusely contrast-enhancing. Otherwise, the thoracic vertebral bodies are normal. Cord:  Normal signal and morphology.  No epidural collection. Paraspinal and other soft tissues: Biapical pulmonary scarring. Left basilar opacity. Disc levels: No spinal canal or neural foraminal stenosis. MRI LUMBAR SPINE FINDINGS Segmentation:  Standard Alignment:  Grade 1 anterolisthesis  at L4-5 Vertebrae:  No fracture, evidence of discitis, or bone lesion. Conus medullaris and cauda equina: Conus extends to the L1 level. Conus and cauda equina appear normal. Paraspinal and other soft tissues: Negative Disc levels: L1-2: Normal. L2-3: Small disc bulge without stenosis. L3-4: Small disc bulge and mild facet hypertrophy without stenosis. L4-5: Small disc bulge and moderate facet hypertrophy with mild spinal canal stenosis and mild left foraminal stenosis. L5-S1: Left asymmetric disc bulge and mild facet hypertrophy without stenosis. IMPRESSION: 1. No acute abnormality of the cervical, thoracic or lumbar spine. 2. Left paraspinal mass at T2-3 with involvement of the left pedicle. This could indicate extra medullary hematopoiesis, a nerve sheath tumor or a metastasis from an unknown primary. The etiology is unclear and PET/CT may be helpful in further assessing the probability of metastatic disease. 3. Mild cervical and lumbar degenerative disc disease with mild spinal canal stenosis  at C6-7. Electronically Signed   By: Ulyses Jarred M.D.   On: 11/16/2020 02:14   MR Lumbar Spine W Wo Contrast  Result Date: 11/16/2020 CLINICAL DATA:  Found unresponsive EXAM: MRI CERVICAL, THORACIC AND LUMBAR SPINE WITHOUT AND WITH CONTRAST TECHNIQUE: Multiplanar and multiecho pulse sequences of the cervical spine, to include the craniocervical junction and cervicothoracic junction, and thoracic and lumbar spine, were obtained without and with intravenous contrast. CONTRAST:  1m GADAVIST GADOBUTROL 1 MMOL/ML IV SOLN COMPARISON:  None. FINDINGS: MRI CERVICAL SPINE FINDINGS Alignment: Physiologic. Vertebrae: No fracture, evidence of discitis, or bone lesion. Cord: Normal signal and morphology. Posterior Fossa, vertebral arteries, paraspinal tissues: Negative. Disc levels: C2-3: Unremarkable. C3-4: Small central disc protrusion without stenosis. C4-5: Left facet hypertrophy.  No stenosis. C5-6: Small right subarticular  disc protrusion indenting the ventral spinal cord. No spinal canal stenosis. C6-7: Small disc bulge narrowing the ventral thecal sac. Mild spinal canal stenosis. C7-T1: Unremarkable MRI THORACIC SPINE FINDINGS Alignment:  Physiologic. Vertebrae: There is a paraspinal mass on the left at T2-3 that also involves the left half of T2 vertebral body and the left pedicle. The lesion is diffusely contrast-enhancing. Otherwise, the thoracic vertebral bodies are normal. Cord:  Normal signal and morphology.  No epidural collection. Paraspinal and other soft tissues: Biapical pulmonary scarring. Left basilar opacity. Disc levels: No spinal canal or neural foraminal stenosis. MRI LUMBAR SPINE FINDINGS Segmentation:  Standard Alignment:  Grade 1 anterolisthesis at L4-5 Vertebrae:  No fracture, evidence of discitis, or bone lesion. Conus medullaris and cauda equina: Conus extends to the L1 level. Conus and cauda equina appear normal. Paraspinal and other soft tissues: Negative Disc levels: L1-2: Normal. L2-3: Small disc bulge without stenosis. L3-4: Small disc bulge and mild facet hypertrophy without stenosis. L4-5: Small disc bulge and moderate facet hypertrophy with mild spinal canal stenosis and mild left foraminal stenosis. L5-S1: Left asymmetric disc bulge and mild facet hypertrophy without stenosis. IMPRESSION: 1. No acute abnormality of the cervical, thoracic or lumbar spine. 2. Left paraspinal mass at T2-3 with involvement of the left pedicle. This could indicate extra medullary hematopoiesis, a nerve sheath tumor or a metastasis from an unknown primary. The etiology is unclear and PET/CT may be helpful in further assessing the probability of metastatic disease. 3. Mild cervical and lumbar degenerative disc disease with mild spinal canal stenosis at C6-7. Electronically Signed   By: KUlyses JarredM.D.   On: 11/16/2020 02:14   DG Chest Port 1 View  Result Date: 11/15/2020 CLINICAL DATA:  Fever, unresponsive EXAM:  PORTABLE CHEST 1 VIEW COMPARISON:  03/05/2020 FINDINGS: Mild left lower lobe opacity, likely atelectasis, less likely pneumonia. Right lung is clear. No pleural effusion or pneumothorax. Heart is normal in size.  Thoracic aortic atherosclerosis. Right chest power port terminates in the upper right atrium. IMPRESSION: Mild left lower lobe opacity, likely atelectasis, less likely pneumonia. Electronically Signed   By: SJulian HyM.D.   On: 11/15/2020 21:19   CT CHEST ABDOMEN PELVIS WO CONTRAST  Result Date: 11/15/2020 CLINICAL DATA:  Found unresponsive. EXAM: CT CHEST, ABDOMEN AND PELVIS WITHOUT CONTRAST TECHNIQUE: Multidetector CT imaging of the chest, abdomen and pelvis was performed following the standard protocol without IV contrast. COMPARISON:  September 29, 2019 FINDINGS: CT CHEST FINDINGS Cardiovascular: A right-sided venous Port-A-Cath is in place. Moderate severity calcification of the aortic arch is noted without evidence of aortic aneurysm. Normal heart size. No pericardial effusion. Mediastinum/Nodes: No enlarged mediastinal, hilar, or axillary lymph nodes. Thyroid  gland, trachea, and esophagus demonstrate no significant findings. Lungs/Pleura: Mild-to-moderate severity central lobular emphysema is seen with stable, marked severity, partially calcified areas of scarring and/or atelectasis are seen within the bilateral apices. Mild to moderate severity infiltrate is seen within the anterior aspect of the left lung base. There is no evidence of a pleural effusion or pneumothorax. Musculoskeletal: Multilevel degenerative changes are seen throughout the thoracic spine. CT ABDOMEN PELVIS FINDINGS Hepatobiliary: No focal liver abnormality is seen. No gallstones, gallbladder wall thickening, or biliary dilatation. Pancreas: Unremarkable. No pancreatic ductal dilatation or surrounding inflammatory changes. Spleen: Normal in size without focal abnormality. Adrenals/Urinary Tract: Adrenal glands are  unremarkable. Kidneys are normal, without renal calculi, focal lesion, or hydronephrosis. Bladder is unremarkable. Stomach/Bowel: Stomach is within normal limits. Appendix appears normal. Stool is seen throughout the large bowel. No evidence of bowel wall thickening, distention, or inflammatory changes. Vascular/Lymphatic: Aortic atherosclerosis. No enlarged abdominal or pelvic lymph nodes. Reproductive: Status post hysterectomy. No adnexal masses. Other: No abdominal wall hernia or abnormality. No abdominopelvic ascites. Musculoskeletal: Multilevel degenerative changes are seen throughout the lumbar spine. IMPRESSION: 1. Mild-to-moderate severity left lower lobe infiltrate. 2. Emphysematous lung disease with stable, marked severity, partially calcified areas of biapical scarring and/or atelectasis. 3. No acute or active process within the abdomen or pelvis. 4. Aortic atherosclerosis. Aortic Atherosclerosis (ICD10-I70.0) and Emphysema (ICD10-J43.9). Electronically Signed   By: Virgina Norfolk M.D.   On: 11/15/2020 22:04        Scheduled Meds:  [START ON 11/17/2020] dexamethasone (DECADRON) injection  4 mg Intravenous Q24H   heparin  5,000 Units Subcutaneous Q8H   insulin aspart  0-15 Units Subcutaneous TID WC   insulin aspart  0-5 Units Subcutaneous QHS   mometasone-formoterol  2 puff Inhalation BID   valACYclovir  1,000 mg Oral BID   Continuous Infusions:  sodium chloride 100 mL/hr at 11/16/20 0945   acetaminophen Stopped (11/15/20 2148)   ceFEPime (MAXIPIME) IV     vancomycin       LOS: 0 days    Time spent: 35 minutes    Sidney Ace, MD [facility]   If 7PM-7AM, please contact night-coverage  11/16/2020, 3:21 PM

## 2020-11-16 NOTE — ED Notes (Signed)
Dining services contacted to send patient lunch tray.

## 2020-11-16 NOTE — ED Notes (Signed)
CBG 142.

## 2020-11-16 NOTE — Progress Notes (Signed)
SLP Cancellation Note  Patient Details Name: ADY HEIMANN MRN: [id number] DOB: Mar 15, 1950   Cancelled treatment:       Reason Eval/Treat Not Completed: SLP screened, no needs identified, will sign off  Pt's nurse spoke with this Probation officer and reported that pt is currently consuming POs without any issues. No further services indicated.   Ramal Eckhardt B. Rutherford Nail M.S., CCC-SLP, [place] Office [phone]   Austine Wiedeman Rutherford Nail 11/16/2020, 1:24 PM

## 2020-11-16 NOTE — ED Notes (Signed)
Pt's oncologist, Dr. Ralph Dowdy [phone]

## 2020-11-16 NOTE — Progress Notes (Signed)
Pharmacy Antibiotic Note  Beth Stanley is a 70 y.o. female admitted on 11/15/2020 with sepsis.  Pharmacy has been consulted for Cefepime and Vancomycin dosing.  Plan: Adjust Cefepime 2 gm q12h to q8h Pt given initial dose of Vancomycin 1 gm. Will change maintenance dose from Vancomycin 1000 mg IV q24h to 1250 mg IV q24h  Goal AUC 400-550. Expected AUC: 506.9 SCr used: 0.91; Vd 0.72; IBW Obtain vancomycin level around 4th or 5th dose if continued  Monitor renal function and adjust dose as clinically indicated   Height: 5\' 6"  (167.6 cm) Weight: 62 kg (136 lb 11 oz) IBW/kg (Calculated) : 59.3  Temp (24hrs), Avg:100.5 F (38.1 C), Min:99 F (37.2 C), Max:103.2 F (39.6 C)  Recent Labs  Lab 11/15/20 2103 11/15/20 2209 11/16/20 0429  WBC 9.1  --  5.8  CREATININE 0.91  --  0.69  LATICACIDVEN 1.9 1.4  --      Estimated Creatinine Clearance: 61.3 mL/min (by C-G formula based on SCr of 0.69 mg/dL).    Allergies  Allergen Reactions   Aspirin     Stomach cramp   Latex Swelling   Other    Tape Rash    Makes Whelps on Skin Makes Whelps on Skin    Antimicrobials this admission: 11/3 Cefepime >>  11/3 Vancomycin >>  11/3 Flagyl >> x 1 dose  Microbiology results: 11/3 BCx: NG <12h 11/3 UCx: Pending   Thank you for allowing pharmacy to be a part of this patient's care.  Sherilyn Banker, PharmD Clinical Pharmacist 11/16/2020 10:28 AM

## 2020-11-16 NOTE — ED Notes (Signed)
Husband asking if pt can eat. Sent msg to attending provider to ask if NPO can be discontinued and diet order placed.

## 2020-11-16 NOTE — ED Notes (Signed)
Patient transported to MRI 

## 2020-11-16 NOTE — ED Notes (Signed)
Provided mouth swabs to pt. Husband at bedside.

## 2020-11-17 ENCOUNTER — Other Ambulatory Visit: Payer: Self-pay

## 2020-11-17 LAB — GLUCOSE, CAPILLARY
Glucose-Capillary: 208 mg/dL — ABNORMAL HIGH (ref 70–99)
Glucose-Capillary: 252 mg/dL — ABNORMAL HIGH (ref 70–99)
Glucose-Capillary: 321 mg/dL — ABNORMAL HIGH (ref 70–99)
Glucose-Capillary: 277 mg/dL — ABNORMAL HIGH (ref 70–99)

## 2020-11-17 LAB — MRSA NEXT GEN BY PCR, NASAL: MRSA by PCR Next Gen: NOT DETECTED

## 2020-11-17 LAB — PROCALCITONIN: Procalcitonin: <0.1 ng/mL

## 2020-11-17 NOTE — Progress Notes (Signed)
PROGRESS NOTE    Beth Stanley  DVV:[id number] DOB: 07-15-1950 DOA: 11/15/2020 PCP: Imagene Riches, NP    Brief Narrative:  70 y.o. 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   Spouse reports that upon returning home from the oncology office, patient states that she was tired.  So he allowed her to sleep in the car.  He reports that patient slept from approximately 3 pm - 7 pm, she was sitting in the car, with car turned off, outside and not in an enclosed space like a garage.  It was noted in the chart the patient was unresponsive however patient was just sleeping and difficult to arouse.   Spouse at bedside reports that patient took her Decadron prior to seeing her oncologist on 11/15/20.  He further reports that her vitals were noted to be normal at the oncologist office visit.  He did endorse that patient just feels overall generalized weakness prior to going to the doctor however they attribute this to her chronic medical issues.  He denies any vomiting.  Encephalopathy has been persistent.  Started to slowly improve on broad-spectrum IV antibiotics.  No clear source of infection.  Suspect possible pneumonia.  Also initial suspicion for adrenal insufficiency however a.m. cortisol 32 making this less likely.   Assessment & Plan:   Principal Problem:   Altered mental status Active Problems:   Lymphoma ([place])   Hyperlipidemia   Hypertension   Depression   Smoker   COVID-19 virus infection   Acute metabolic encephalopathy   Encephalopathy acute   Sepsis ([place])  Acute metabolic encephalopathy Sepsis secondary to pneumonia Etiology of lethargy is unclear Sepsis criteria met with fever, infectious focus, tachycardia, tachypnea Suspect infection Given immunocompromise state cannot rule out Polypharmacy also  consideration Vital signs stable.  Patient protecting airway MRI imaging survey negative for acute source.  T2-T3 lesion likely unrelated to current presentation No meningeal signs, lumbar puncture deferred RVP negative 11/5: Mental status much improved Plan: Continue vancomycin and cefepime for now Check MRSA screen, if negative DC Vanco Follow blood and urine cultures, no growth to date Low-dose steroid Monitor vitals and fever curve If patient remains hemodynamically stable and mental status improved we will plan on discharge home 11/6  Non-insulin-dependent diabetes mellitus Sign scale insulin with bedtime coverage  Diffuse large B-cell lymphoma Left parotid gland tumor Possible metastatic deposit at T2-T3 Patient has a very complex oncologic history.  [name] majority of her care at [facility].  Patient and husband are local to [place].  No acute oncology issues at this time.  Will consult oncology if any specific questions arise otherwise outpatient follow-[facility]    DVT prophylaxis: SQ heparin Code Status: Full Family Communication: Husband at bedside 11/4, 11/5  Disposition Plan: Status is: Inpatient  Remains inpatient appropriate because: Sepsis, possible pulmonary source, acute metabolic encephalopathy of unclear etiology.       Level of care: Med-Surg  Consultants:  None  Procedures:  None  Antimicrobials: Vancomycin Cefepime   Subjective: seen and examined.  Much more awake this morning.  Able to provide history.  Husband at bedside.  Objective: Vitals:   11/17/20 0100 11/17/20 0555 11/17/20 0800 11/17/20 0835  BP: 133/67 (!) 121/59  117/62  Pulse: 71 73  89  Resp: _0 Temp:  98.1 F (36.7 C) 97.8 F (36.6 C)  98.2 F (36.8 C)  TempSrc: Oral Oral  Oral  SpO2: 95% 100% 98% 95%  Weight: 59.5 kg     Height: 5' 3" (1.6 m)       Intake/Output Summary (Last 24 hours) at 11/17/2020  1021 Last data filed at 11/17/2020 0555 Gross per 24 hour  Intake 2049.52 ml  Output 475 ml  Net 1574.52 ml   Filed Weights   11/15/20 2104 11/17/20 0100  Weight: 62 kg 59.5 kg    Examination:  General exam: Awake.  No acute distress.  Appears fatigued Respiratory system: Lungs clear.  Normal work of breathing.  Room air Cardiovascular system: S1-S2, RRR, no murmurs, no pedal edema Gastrointestinal system: Soft, NT/ND, normal bowel sounds Central nervous system: Alert, oriented x3, no focal deficits Extremities: Diffusely decreased power bilaterally Skin: No rashes, lesions or ulcers Psychiatry: Judgement and insight appear intact. Mood & affect appropriate    Data Reviewed: I have personally reviewed following labs and imaging studies  CBC: Recent Labs  Lab 11/15/20 2103 11/16/20 0429  WBC 9.1 5.8  NEUTROABS 7.0  --   HGB 13.6 12.0  HCT 41.4 35.8*  MCV 96.1 97.5  PLT 140* 846*   Basic Metabolic Panel: Recent Labs  Lab 11/15/20 2103 11/16/20 0429  NA 137 137  K 4.0 3.8  CL 97* 101  CO2 27 28  GLUCOSE 170* 152*  BUN 18 17  CREATININE 0.91 0.69  CALCIUM 9.0 8.2*   GFR: Estimated Creatinine Clearance: 54.1 mL/min (by C-G formula based on SCr of 0.69 mg/dL). Liver Function Tests: Recent Labs  Lab 11/15/20 2103  AST 17  ALT 23  ALKPHOS 81  BILITOT 0.9  PROT 7.3  ALBUMIN 3.5   Recent Labs  Lab 11/15/20 2103  LIPASE 44   Recent Labs  Lab 11/15/20 2209  AMMONIA 13   Coagulation Profile: Recent Labs  Lab 11/15/20 2103  INR 1.1   Cardiac Enzymes: Recent Labs  Lab 11/15/20 2103  CKTOTAL 32*   BNP (last 3 results) No results for input(s): PROBNP in the last 8760 hours. HbA1C: Recent Labs    11/15/20 2103  HGBA1C 8.0*   CBG: Recent Labs  Lab 11/16/20 0752 11/16/20 1232 11/16/20 1750 11/16/20 2051 11/17/20 0833  GLUCAP 142* 188* 235* 188* 208*   Lipid Profile: No results for input(s): CHOL, HDL, LDLCALC, TRIG, CHOLHDL,  LDLDIRECT in the last 72 hours. Thyroid Function Tests: No results for input(s): TSH, T4TOTAL, FREET4, T3FREE, THYROIDAB in the last 72 hours. Anemia Panel: No results for input(s): VITAMINB12, FOLATE, FERRITIN, TIBC, IRON, RETICCTPCT in the last 72 hours. Sepsis Labs: Recent Labs  Lab 11/15/20 2103 11/15/20 2209 11/16/20 0429 11/17/20 0410  PROCALCITON  --  <0.10 0.16 <0.10  LATICACIDVEN 1.9 1.4  --   --     Recent Results (from the past 240 hour(s))  Resp Panel by RT-PCR (Flu A&B, Covid) Nasopharyngeal Swab     Status: Abnormal   Collection Time: 11/15/20  9:03 PM   Specimen: Nasopharyngeal Swab; Nasopharyngeal(NP) swabs in vial transport medium  Result Value Ref Range Status   SARS Coronavirus 2 by RT PCR POSITIVE (A) NEGATIVE Final    Comment: RESULT CALLED TO, READ BACK BY AND VERIFIED WITH: TIA BULLOCK RN [id number] 11/15/20 HNM (NOTE) SARS-CoV-2 target nucleic acids are DETECTED.  The SARS-CoV-2 RNA is generally detectable in upper respiratory specimens during the acute phase of infection. Positive results are indicative of the  presence of the identified virus, but do not rule out bacterial infection or co-infection with other pathogens not detected by the test. Clinical correlation with patient history and other diagnostic information is necessary to determine patient infection status. The expected result is Negative.  Fact Sheet for Patients: EntrepreneurPulse.com.au  Fact Sheet for Healthcare Providers: IncredibleEmployment.be  This test is not yet approved or cleared by the [place] FDA and  has been authorized for detection and/or diagnosis of SARS-CoV-2 by FDA under an Emergency Use Authorization (EUA).  This EUA will remain in effect (meaning this test can be u sed) for the duration of  the COVID-19 declaration under Section 564(b)(1) of the Act, 21 U.S.C. section 360bbb-3(b)(1), unless the authorization is terminated or  revoked sooner.     Influenza A by PCR NEGATIVE NEGATIVE Final   Influenza B by PCR NEGATIVE NEGATIVE Final    Comment: (NOTE) The Xpert Xpress SARS-CoV-2/FLU/RSV plus assay is intended as an aid in the diagnosis of influenza from Nasopharyngeal swab specimens and should not be used as a sole basis for treatment. Nasal washings and aspirates are unacceptable for Xpert Xpress SARS-CoV-2/FLU/RSV testing.  Fact Sheet for Patients: EntrepreneurPulse.com.au  Fact Sheet for Healthcare Providers: IncredibleEmployment.be  This test is not yet approved or cleared by the [place] FDA and has been authorized for detection and/or diagnosis of SARS-CoV-2 by FDA under an Emergency Use Authorization (EUA). This EUA will remain in effect (meaning this test can be used) for the duration of the COVID-19 declaration under Section 564(b)(1) of the Act, 21 U.S.C. section 360bbb-3(b)(1), unless the authorization is terminated or revoked.  Performed at [facility], [address] [place], [place] [address]   Blood Culture (routine x 2)     Status: None (Preliminary result)   Collection Time: 11/15/20  9:03 PM   Specimen: BLOOD  Result Value Ref Range Status   Specimen Description BLOOD LEFT ASSIST CONTROL  Final   Special Requests   Final    BOTTLES DRAWN AEROBIC AND ANAEROBIC Blood Culture results may not be optimal due to an inadequate volume of blood received in culture bottles   Culture   Final    NO GROWTH 2 DAYS Performed at [facility], [address] [place], [place] [address]    Report Status PENDING  Incomplete  Urine Culture     Status: None   Collection Time: 11/15/20  9:03 PM   Specimen: Urine, Random  Result Value Ref Range Status   Specimen Description   Final    URINE, RANDOM Performed at [facility], [address] [place], [place] [address]    Special Requests   Final    NONE Performed at  [facility], [address], [place], [place] [address]    Culture   Final    NO GROWTH Performed at [facility], [place] [address] [place], [place] [address]    Report Status 11/16/2020 FINAL  Final  Blood Culture (routine x 2)     Status: None (Preliminary result)   Collection Time: 11/15/20 10:09 PM   Specimen: BLOOD  Result Value Ref Range Status   Specimen Description BLOOD LEFT HAND  Final   Special Requests   Final    BOTTLES DRAWN AEROBIC AND ANAEROBIC Blood Culture adequate volume   Culture   Final    NO GROWTH 2 DAYS Performed at [facility], [address], [place], [place] [address]    Report Status  PENDING  Incomplete  Respiratory (~20 pathogens) panel by PCR     Status: None   Collection Time: 11/16/20  1:45 AM   Specimen: Nasopharyngeal Swab; Respiratory  Result Value Ref Range Status   Adenovirus NOT DETECTED NOT DETECTED Final   Coronavirus 229E NOT DETECTED NOT DETECTED Final    Comment: (NOTE) The Coronavirus on the Respiratory Panel, DOES NOT test for the novel  Coronavirus (2019 nCoV)    Coronavirus HKU1 NOT DETECTED NOT DETECTED Final   Coronavirus NL63 NOT DETECTED NOT DETECTED Final   Coronavirus OC43 NOT DETECTED NOT DETECTED Final   Metapneumovirus NOT DETECTED NOT DETECTED Final   Rhinovirus / Enterovirus NOT DETECTED NOT DETECTED Final   Influenza A NOT DETECTED NOT DETECTED Final   Influenza B NOT DETECTED NOT DETECTED Final   Parainfluenza Virus 1 NOT DETECTED NOT DETECTED Final   Parainfluenza Virus 2 NOT DETECTED NOT DETECTED Final   Parainfluenza Virus 3 NOT DETECTED NOT DETECTED Final   Parainfluenza Virus 4 NOT DETECTED NOT DETECTED Final   Respiratory Syncytial Virus NOT DETECTED NOT DETECTED Final   Bordetella pertussis NOT DETECTED NOT DETECTED Final   Bordetella Parapertussis NOT DETECTED NOT DETECTED Final   Chlamydophila pneumoniae NOT DETECTED NOT DETECTED Final   Mycoplasma pneumoniae NOT DETECTED NOT  DETECTED Final    Comment: Performed at [facility], [place], [place], [place] [address]         Radiology Studies: CT HEAD WO CONTRAST (5MM)  Result Date: 11/15/2020 CLINICAL DATA:  Found unresponsive. EXAM: CT HEAD WITHOUT CONTRAST TECHNIQUE: Contiguous axial images were obtained from the base of the skull through the vertex without intravenous contrast. COMPARISON:  September 29, 2019 FINDINGS: Brain: There is mild to moderate severity cerebral atrophy with widening of the extra-axial spaces and ventricular dilatation. There are areas of decreased attenuation within the white matter tracts of the supratentorial brain, consistent with microvascular disease changes. Vascular: No hyperdense vessel or unexpected calcification. Skull: Normal. Negative for fracture or focal lesion. Sinuses/Orbits: Mild frontal sinus, mild bilateral maxillary sinus and moderate severity bilateral ethmoid sinus mucosal thickening is seen. Other: None. IMPRESSION: 1. Generalized cerebral atrophy. 2. No acute intracranial abnormality. 3. Paranasal sinus disease. Electronically Signed   By: Virgina Norfolk M.D.   On: 11/15/2020 21:53   MR Brain W and Wo Contrast  Result Date: 11/16/2020 CLINICAL DATA:  Found unresponsive EXAM: MRI HEAD WITHOUT AND WITH CONTRAST TECHNIQUE: Multiplanar, multiecho pulse sequences of the brain and surrounding structures were obtained without and with intravenous contrast. CONTRAST:  51m GADAVIST GADOBUTROL 1 MMOL/ML IV SOLN COMPARISON:  09/30/2019 FINDINGS: Brain: No acute infarct, mass effect or extra-axial collection. No acute or chronic hemorrhage. Confluent hyperintense T2-weighted white matter signal. Generalized volume loss without a clear lobar predilection. The midline structures are normal. Vascular: Major flow voids are preserved. Skull and upper cervical spine: Normal calvarium and skull base. Visualized upper cervical spine and soft tissues are normal.  Sinuses/Orbits:No paranasal sinus fluid levels or advanced mucosal thickening. No mastoid or middle ear effusion. Normal orbits. IMPRESSION: 1. No acute intracranial abnormality. 2. Confluent hyperintense T2-weighted white matter signal, most consistent with chronic microvascular ischemia. 3. Generalized volume loss without a clear lobar predilection. Cerebral Atrophy (ICD10-G31.9). Electronically Signed   By: KUlyses JarredM.D.   On: 11/16/2020 01:53   MR Cervical Spine W or Wo Contrast  Result Date: 11/16/2020 CLINICAL DATA:  Found unresponsive EXAM: MRI CERVICAL, THORACIC AND LUMBAR SPINE WITHOUT AND WITH  CONTRAST TECHNIQUE: Multiplanar and multiecho pulse sequences of the cervical spine, to include the craniocervical junction and cervicothoracic junction, and thoracic and lumbar spine, were obtained without and with intravenous contrast. CONTRAST:  16m GADAVIST GADOBUTROL 1 MMOL/ML IV SOLN COMPARISON:  None. FINDINGS: MRI CERVICAL SPINE FINDINGS Alignment: Physiologic. Vertebrae: No fracture, evidence of discitis, or bone lesion. Cord: Normal signal and morphology. Posterior Fossa, vertebral arteries, paraspinal tissues: Negative. Disc levels: C2-3: Unremarkable. C3-4: Small central disc protrusion without stenosis. C4-5: Left facet hypertrophy.  No stenosis. C5-6: Small right subarticular disc protrusion indenting the ventral spinal cord. No spinal canal stenosis. C6-7: Small disc bulge narrowing the ventral thecal sac. Mild spinal canal stenosis. C7-T1: Unremarkable MRI THORACIC SPINE FINDINGS Alignment:  Physiologic. Vertebrae: There is a paraspinal mass on the left at T2-3 that also involves the left half of T2 vertebral body and the left pedicle. The lesion is diffusely contrast-enhancing. Otherwise, the thoracic vertebral bodies are normal. Cord:  Normal signal and morphology.  No epidural collection. Paraspinal and other soft tissues: Biapical pulmonary scarring. Left basilar opacity. Disc levels: No  spinal canal or neural foraminal stenosis. MRI LUMBAR SPINE FINDINGS Segmentation:  Standard Alignment:  Grade 1 anterolisthesis at L4-5 Vertebrae:  No fracture, evidence of discitis, or bone lesion. Conus medullaris and cauda equina: Conus extends to the L1 level. Conus and cauda equina appear normal. Paraspinal and other soft tissues: Negative Disc levels: L1-2: Normal. L2-3: Small disc bulge without stenosis. L3-4: Small disc bulge and mild facet hypertrophy without stenosis. L4-5: Small disc bulge and moderate facet hypertrophy with mild spinal canal stenosis and mild left foraminal stenosis. L5-S1: Left asymmetric disc bulge and mild facet hypertrophy without stenosis. IMPRESSION: 1. No acute abnormality of the cervical, thoracic or lumbar spine. 2. Left paraspinal mass at T2-3 with involvement of the left pedicle. This could indicate extra medullary hematopoiesis, a nerve sheath tumor or a metastasis from an unknown primary. The etiology is unclear and PET/CT may be helpful in further assessing the probability of metastatic disease. 3. Mild cervical and lumbar degenerative disc disease with mild spinal canal stenosis at C6-7. Electronically Signed   By: KUlyses JarredM.D.   On: 11/16/2020 02:14   MR THORACIC SPINE W WO CONTRAST  Result Date: 11/16/2020 CLINICAL DATA:  Found unresponsive EXAM: MRI CERVICAL, THORACIC AND LUMBAR SPINE WITHOUT AND WITH CONTRAST TECHNIQUE: Multiplanar and multiecho pulse sequences of the cervical spine, to include the craniocervical junction and cervicothoracic junction, and thoracic and lumbar spine, were obtained without and with intravenous contrast. CONTRAST:  619mGADAVIST GADOBUTROL 1 MMOL/ML IV SOLN COMPARISON:  None. FINDINGS: MRI CERVICAL SPINE FINDINGS Alignment: Physiologic. Vertebrae: No fracture, evidence of discitis, or bone lesion. Cord: Normal signal and morphology. Posterior Fossa, vertebral arteries, paraspinal tissues: Negative. Disc levels: C2-3:  Unremarkable. C3-4: Small central disc protrusion without stenosis. C4-5: Left facet hypertrophy.  No stenosis. C5-6: Small right subarticular disc protrusion indenting the ventral spinal cord. No spinal canal stenosis. C6-7: Small disc bulge narrowing the ventral thecal sac. Mild spinal canal stenosis. C7-T1: Unremarkable MRI THORACIC SPINE FINDINGS Alignment:  Physiologic. Vertebrae: There is a paraspinal mass on the left at T2-3 that also involves the left half of T2 vertebral body and the left pedicle. The lesion is diffusely contrast-enhancing. Otherwise, the thoracic vertebral bodies are normal. Cord:  Normal signal and morphology.  No epidural collection. Paraspinal and other soft tissues: Biapical pulmonary scarring. Left basilar opacity. Disc levels: No spinal canal or neural foraminal stenosis. MRI LUMBAR  SPINE FINDINGS Segmentation:  Standard Alignment:  Grade 1 anterolisthesis at L4-5 Vertebrae:  No fracture, evidence of discitis, or bone lesion. Conus medullaris and cauda equina: Conus extends to the L1 level. Conus and cauda equina appear normal. Paraspinal and other soft tissues: Negative Disc levels: L1-2: Normal. L2-3: Small disc bulge without stenosis. L3-4: Small disc bulge and mild facet hypertrophy without stenosis. L4-5: Small disc bulge and moderate facet hypertrophy with mild spinal canal stenosis and mild left foraminal stenosis. L5-S1: Left asymmetric disc bulge and mild facet hypertrophy without stenosis. IMPRESSION: 1. No acute abnormality of the cervical, thoracic or lumbar spine. 2. Left paraspinal mass at T2-3 with involvement of the left pedicle. This could indicate extra medullary hematopoiesis, a nerve sheath tumor or a metastasis from an unknown primary. The etiology is unclear and PET/CT may be helpful in further assessing the probability of metastatic disease. 3. Mild cervical and lumbar degenerative disc disease with mild spinal canal stenosis at C6-7. Electronically Signed    By: Ulyses Jarred M.D.   On: 11/16/2020 02:14   MR Lumbar Spine W Wo Contrast  Result Date: 11/16/2020 CLINICAL DATA:  Found unresponsive EXAM: MRI CERVICAL, THORACIC AND LUMBAR SPINE WITHOUT AND WITH CONTRAST TECHNIQUE: Multiplanar and multiecho pulse sequences of the cervical spine, to include the craniocervical junction and cervicothoracic junction, and thoracic and lumbar spine, were obtained without and with intravenous contrast. CONTRAST:  31m GADAVIST GADOBUTROL 1 MMOL/ML IV SOLN COMPARISON:  None. FINDINGS: MRI CERVICAL SPINE FINDINGS Alignment: Physiologic. Vertebrae: No fracture, evidence of discitis, or bone lesion. Cord: Normal signal and morphology. Posterior Fossa, vertebral arteries, paraspinal tissues: Negative. Disc levels: C2-3: Unremarkable. C3-4: Small central disc protrusion without stenosis. C4-5: Left facet hypertrophy.  No stenosis. C5-6: Small right subarticular disc protrusion indenting the ventral spinal cord. No spinal canal stenosis. C6-7: Small disc bulge narrowing the ventral thecal sac. Mild spinal canal stenosis. C7-T1: Unremarkable MRI THORACIC SPINE FINDINGS Alignment:  Physiologic. Vertebrae: There is a paraspinal mass on the left at T2-3 that also involves the left half of T2 vertebral body and the left pedicle. The lesion is diffusely contrast-enhancing. Otherwise, the thoracic vertebral bodies are normal. Cord:  Normal signal and morphology.  No epidural collection. Paraspinal and other soft tissues: Biapical pulmonary scarring. Left basilar opacity. Disc levels: No spinal canal or neural foraminal stenosis. MRI LUMBAR SPINE FINDINGS Segmentation:  Standard Alignment:  Grade 1 anterolisthesis at L4-5 Vertebrae:  No fracture, evidence of discitis, or bone lesion. Conus medullaris and cauda equina: Conus extends to the L1 level. Conus and cauda equina appear normal. Paraspinal and other soft tissues: Negative Disc levels: L1-2: Normal. L2-3: Small disc bulge without stenosis.  L3-4: Small disc bulge and mild facet hypertrophy without stenosis. L4-5: Small disc bulge and moderate facet hypertrophy with mild spinal canal stenosis and mild left foraminal stenosis. L5-S1: Left asymmetric disc bulge and mild facet hypertrophy without stenosis. IMPRESSION: 1. No acute abnormality of the cervical, thoracic or lumbar spine. 2. Left paraspinal mass at T2-3 with involvement of the left pedicle. This could indicate extra medullary hematopoiesis, a nerve sheath tumor or a metastasis from an unknown primary. The etiology is unclear and PET/CT may be helpful in further assessing the probability of metastatic disease. 3. Mild cervical and lumbar degenerative disc disease with mild spinal canal stenosis at C6-7. Electronically Signed   By: KUlyses JarredM.D.   On: 11/16/2020 02:14   DG Chest Port 1 View  Result Date: 11/15/2020 CLINICAL DATA:  Fever, unresponsive EXAM: PORTABLE CHEST 1 VIEW COMPARISON:  03/05/2020 FINDINGS: Mild left lower lobe opacity, likely atelectasis, less likely pneumonia. Right lung is clear. No pleural effusion or pneumothorax. Heart is normal in size.  Thoracic aortic atherosclerosis. Right chest power port terminates in the upper right atrium. IMPRESSION: Mild left lower lobe opacity, likely atelectasis, less likely pneumonia. Electronically Signed   By: Julian Hy M.D.   On: 11/15/2020 21:19   CT CHEST ABDOMEN PELVIS WO CONTRAST  Result Date: 11/15/2020 CLINICAL DATA:  Found unresponsive. EXAM: CT CHEST, ABDOMEN AND PELVIS WITHOUT CONTRAST TECHNIQUE: Multidetector CT imaging of the chest, abdomen and pelvis was performed following the standard protocol without IV contrast. COMPARISON:  September 29, 2019 FINDINGS: CT CHEST FINDINGS Cardiovascular: A right-sided venous Port-A-Cath is in place. Moderate severity calcification of the aortic arch is noted without evidence of aortic aneurysm. Normal heart size. No pericardial effusion. Mediastinum/Nodes: No enlarged  mediastinal, hilar, or axillary lymph nodes. Thyroid gland, trachea, and esophagus demonstrate no significant findings. Lungs/Pleura: Mild-to-moderate severity central lobular emphysema is seen with stable, marked severity, partially calcified areas of scarring and/or atelectasis are seen within the bilateral apices. Mild to moderate severity infiltrate is seen within the anterior aspect of the left lung base. There is no evidence of a pleural effusion or pneumothorax. Musculoskeletal: Multilevel degenerative changes are seen throughout the thoracic spine. CT ABDOMEN PELVIS FINDINGS Hepatobiliary: No focal liver abnormality is seen. No gallstones, gallbladder wall thickening, or biliary dilatation. Pancreas: Unremarkable. No pancreatic ductal dilatation or surrounding inflammatory changes. Spleen: Normal in size without focal abnormality. Adrenals/Urinary Tract: Adrenal glands are unremarkable. Kidneys are normal, without renal calculi, focal lesion, or hydronephrosis. Bladder is unremarkable. Stomach/Bowel: Stomach is within normal limits. Appendix appears normal. Stool is seen throughout the large bowel. No evidence of bowel wall thickening, distention, or inflammatory changes. Vascular/Lymphatic: Aortic atherosclerosis. No enlarged abdominal or pelvic lymph nodes. Reproductive: Status post hysterectomy. No adnexal masses. Other: No abdominal wall hernia or abnormality. No abdominopelvic ascites. Musculoskeletal: Multilevel degenerative changes are seen throughout the lumbar spine. IMPRESSION: 1. Mild-to-moderate severity left lower lobe infiltrate. 2. Emphysematous lung disease with stable, marked severity, partially calcified areas of biapical scarring and/or atelectasis. 3. No acute or active process within the abdomen or pelvis. 4. Aortic atherosclerosis. Aortic Atherosclerosis (ICD10-I70.0) and Emphysema (ICD10-J43.9). Electronically Signed   By: Virgina Norfolk M.D.   On: 11/15/2020 22:04         Scheduled Meds:  dexamethasone (DECADRON) injection  4 mg Intravenous Q24H   heparin  5,000 Units Subcutaneous Q8H   insulin aspart  0-15 Units Subcutaneous TID WC   insulin aspart  0-5 Units Subcutaneous QHS   mometasone-formoterol  2 puff Inhalation BID   valACYclovir  1,000 mg Oral BID   Continuous Infusions:  sodium chloride 100 mL/hr at 11/17/20 0456   ceFEPime (MAXIPIME) IV 2 g (11/17/20 4695)   vancomycin Stopped (11/16/20 2310)     LOS: 1 day    Time spent: 25 minutes    Sidney Ace, MD [facility]   If 7PM-7AM, please contact night-coverage  11/17/2020, 10:21 AM

## 2020-11-18 DIAGNOSIS — G9341 Metabolic encephalopathy: Secondary | ICD-10-CM

## 2020-11-18 LAB — GLUCOSE, CAPILLARY: Glucose-Capillary: 179 mg/dL — ABNORMAL HIGH (ref 70–99)

## 2020-11-18 MED ORDER — SODIUM CHLORIDE 0.9 % IV SOLN
2.0000 g | Freq: Two times a day (BID) | INTRAVENOUS | Status: DC
  Filled 2020-11-18: qty 2

## 2020-11-18 MED ORDER — AMOXICILLIN-POT CLAVULANATE 875-125 MG PO TABS: 1.0000 | ORAL_TABLET | Freq: Two times a day (BID) | ORAL | 0 refills | Status: AC

## 2020-11-18 NOTE — Consult Note (Signed)
PHARMACY NOTE:  ANTIMICROBIAL RENAL DOSAGE ADJUSTMENT  Current antimicrobial regimen includes a mismatch between antimicrobial dosage and estimated renal function.  As per policy approved by the Pharmacy & Therapeutics and Medical Executive Committees, the antimicrobial dosage will be adjusted accordingly.  Current antimicrobial dosage:  Cefepime 2 g IV q8h  Indication: Pneumonia  Renal Function:  Estimated Creatinine Clearance: 54.1 mL/min (by C-G formula based on SCr of 0.69 mg/dL).    Antimicrobial dosage has been changed to:  Cefepime 2 g IV q12h  Thank you for allowing pharmacy to be a part of this patient's care.  Benita Gutter, [facility] 11/18/2020 12:02 PM

## 2020-11-18 NOTE — Progress Notes (Signed)
Discharge instructions given to patient and patient's spouse.  Emphasized education on signs of infection to watch for, encouraged to call the doctor for questions.  Both verbalized understanding.  Needs addressed.  Discharged home.

## 2020-11-18 NOTE — Plan of Care (Signed)

## 2020-11-18 NOTE — Discharge Summary (Signed)
Physician Discharge Summary  CAI FLOTT XBW:[id number] DOB: [birth year] DOA: 11/15/2020  PCP: Imagene Riches, NP  Admit date: 11/15/2020 Discharge date: 11/18/2020  Admitted From: Home Disposition: Home  Recommendations for Outpatient Follow-up:  Follow up with PCP in 1-2 weeks Follow-up with oncology as directed  Home Health: No Equipment/Devices: None  Discharge Condition: Stable CODE STATUS: Full Diet recommendation: Regular  Brief/Interim Summary: 70 y.o. 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   Spouse reports that upon returning home from the oncology office, patient states that she was tired.  So he allowed her to sleep in the car.  He reports that patient slept from approximately 3 pm - 7 pm, she was sitting in the car, with car turned off, outside and not in an enclosed space like a garage.  It was noted in the chart the patient was unresponsive however patient was just sleeping and difficult to arouse.   Spouse at bedside reports that patient took her Decadron prior to seeing her oncologist on 11/15/20.  He further reports that her vitals were noted to be normal at the oncologist office visit.  He did endorse that patient just feels overall generalized weakness prior to going to the doctor however they attribute this to her chronic medical issues.  He denies any vomiting.   Encephalopathy has been persistent.  Started to slowly improve on broad-spectrum IV antibiotics.  No clear source of infection.  Suspect possible pneumonia.  Also initial suspicion for adrenal insufficiency however a.m. cortisol 32 making this less likely.  On day of discharge mental status back at baseline.  Still unclear etiology for encephalopathy.  Do have reasonable suspicion for occult infection though source is unclear.  As such  will recommend empiric course of antibiotics.  Augmentin 875-125 x 7 days prescribed at time of discharge.  Patient will follow up with her established physicians at [facility].    Discharge Diagnoses:  Principal Problem:   Altered mental status Active Problems:   Lymphoma ([place])   Hyperlipidemia   Hypertension   Depression   Smoker   COVID-19 virus infection   Acute metabolic encephalopathy   Encephalopathy acute   Sepsis ([place]) Acute metabolic encephalopathy Sepsis secondary to pneumonia Etiology of lethargy is unclear Sepsis criteria met with fever, infectious focus, tachycardia, tachypnea Suspect infection Given immunocompromise state cannot rule out Polypharmacy also consideration Vital signs stable.  Patient protecting airway MRI imaging survey negative for acute source.  T2-T3 lesion likely unrelated to current presentation No meningeal signs, lumbar puncture deferred RVP negative 11/5: Mental status much improved Plan: Discharge home.  Transition to Augmentin 875-125.  Complete additional 7days.  Monitor mental status at home.  Ensure adequate nutrition.  Follow-up outpatient with established providers at [facility] resume home regimen   Non-insulin-dependent diabetes mellitus Sign scale insulin with bedtime coverage   Diffuse large B-cell lymphoma Left parotid gland tumor Possible metastatic deposit at T2-T3 Patient has a very complex oncologic history.  [name] majority of her care at [facility].  Patient and husband are local to [place].  No acute oncology issues at this time.  Will consult oncology if any specific questions arise otherwise outpatient [facility]   Discharge Instructions  Discharge Instructions     Diet - low sodium heart healthy   Complete by: As directed  Increase activity slowly   Complete by: As directed       Allergies as of 11/18/2020       Reactions   Aspirin     Stomach cramp   Latex Swelling   Other    Tape Rash   Makes Whelps on Skin Makes Whelps on Skin        Medication List     TAKE these medications    acetaminophen 500 MG tablet Commonly known as: TYLENOL Take 1,000 mg by mouth every 6 (six) hours as needed.   albuterol 108 (90 Base) MCG/ACT inhaler Commonly known as: VENTOLIN HFA Inhale 1 puff into the lungs every 6 (six) hours as needed for wheezing.   amoxicillin-clavulanate 875-125 MG tablet Commonly known as: AUGMENTIN Take 1 tablet by mouth 2 (two) times daily for 7 days.   Buprenorphine HCl-Naloxone HCl 8-2 MG Film Place 1 strip under the tongue 3 (three) times daily.   carboxymethylcellulose 0.5 % Soln Commonly known as: REFRESH PLUS Place 1 drop into the left eye 2 (two) times daily.   Cholecalciferol 25 MCG (1000 UT) tablet Take 1 tablet by mouth daily.   clindamycin 1 % gel Commonly known as: CLINDAGEL Apply 1 application topically daily as needed.   cyanocobalamin 1000 MCG tablet Take 1 tablet by mouth daily.   dexamethasone 2 MG tablet Commonly known as: DECADRON Take 1 tablet (57m) by mouth twice daily for 4 days then take 1 tablet (235m by mouth daily for 4 days What changed: Another medication with the same name was removed. Continue taking this medication, and follow the directions you see here.   diclofenac Sodium 1 % Gel Commonly known as: VOLTAREN Apply 4 g topically 4 (four) times daily.   fluticasone-salmeterol 250-50 MCG/ACT Aepb Commonly known as: ADVAIR Inhale 1 puff into the lungs 2 (two) times daily as needed.   freestyle lancets TEST BLOOD GLUCOSE ONCE DAILY   Jardiance 25 MG Tabs tablet Generic drug: empagliflozin Take 25 mg by mouth daily.   lidocaine-prilocaine cream Commonly known as: EMLA Place a dime sized amount over port area approximately 30 minutes prior to access.   magic mouthwash Soln Take 15 mLs by mouth 4 (four) times daily as needed for mouth pain.    magnesium oxide 400 (240 Mg) MG tablet Commonly known as: MAG-OX Take 1 tablet by mouth daily.   multivitamin tablet Take 1 tablet by mouth daily.   omeprazole 20 MG capsule Commonly known as: PRILOSEC Take 20 mg by mouth daily.   ondansetron 4 MG tablet Commonly known as: ZOFRAN Take 4 mg by mouth every 8 (eight) hours as needed for nausea or vomiting.   potassium chloride SA 20 MEQ tablet Commonly known as: KLOR-CON Take 20 mEq by mouth daily.   prochlorperazine 10 MG tablet Commonly known as: COMPAZINE Take 10 mg by mouth every 6 (six) hours.   ramipril 2.5 MG capsule Commonly known as: ALTACE Take 1 capsule (2.5 mg total) by mouth daily.   sertraline 100 MG tablet Commonly known as: ZOLOFT TAKE ONE (1) TABLET EACH DAY What changed:  how much to take how to take this when to take this additional instructions   valACYclovir 1000 MG tablet Commonly known as: VALTREX Take 1,000 mg by mouth 2 (two) times daily. What changed: Another medication with the same name was removed. Continue taking this medication, and follow the directions you see here.   Wh Petrol-Mineral Oil-Lanolin 0.1-0.1 % Oint Place 1 application into the  left eye at bedtime.        Allergies  Allergen Reactions   Aspirin     Stomach cramp   Latex Swelling   Other    Tape Rash    Makes Whelps on Skin Makes Whelps on Skin    Consultations: None   Procedures/Studies: CT HEAD WO CONTRAST (5MM)  Result Date: 11/15/2020 CLINICAL DATA:  Found unresponsive. EXAM: CT HEAD WITHOUT CONTRAST TECHNIQUE: Contiguous axial images were obtained from the base of the skull through the vertex without intravenous contrast. COMPARISON:  September 29, 2019 FINDINGS: Brain: There is mild to moderate severity cerebral atrophy with widening of the extra-axial spaces and ventricular dilatation. There are areas of decreased attenuation within the white matter tracts of the supratentorial brain, consistent with  microvascular disease changes. Vascular: No hyperdense vessel or unexpected calcification. Skull: Normal. Negative for fracture or focal lesion. Sinuses/Orbits: Mild frontal sinus, mild bilateral maxillary sinus and moderate severity bilateral ethmoid sinus mucosal thickening is seen. Other: None. IMPRESSION: 1. Generalized cerebral atrophy. 2. No acute intracranial abnormality. 3. Paranasal sinus disease. Electronically Signed   By: Virgina Norfolk M.D.   On: 11/15/2020 21:53   MR Brain W and Wo Contrast  Result Date: 11/16/2020 CLINICAL DATA:  Found unresponsive EXAM: MRI HEAD WITHOUT AND WITH CONTRAST TECHNIQUE: Multiplanar, multiecho pulse sequences of the brain and surrounding structures were obtained without and with intravenous contrast. CONTRAST:  11m GADAVIST GADOBUTROL 1 MMOL/ML IV SOLN COMPARISON:  09/30/2019 FINDINGS: Brain: No acute infarct, mass effect or extra-axial collection. No acute or chronic hemorrhage. Confluent hyperintense T2-weighted white matter signal. Generalized volume loss without a clear lobar predilection. The midline structures are normal. Vascular: Major flow voids are preserved. Skull and upper cervical spine: Normal calvarium and skull base. Visualized upper cervical spine and soft tissues are normal. Sinuses/Orbits:No paranasal sinus fluid levels or advanced mucosal thickening. No mastoid or middle ear effusion. Normal orbits. IMPRESSION: 1. No acute intracranial abnormality. 2. Confluent hyperintense T2-weighted white matter signal, most consistent with chronic microvascular ischemia. 3. Generalized volume loss without a clear lobar predilection. Cerebral Atrophy (ICD10-G31.9). Electronically Signed   By: KUlyses JarredM.D.   On: 11/16/2020 01:53   MR Cervical Spine W or Wo Contrast  Result Date: 11/16/2020 CLINICAL DATA:  Found unresponsive EXAM: MRI CERVICAL, THORACIC AND LUMBAR SPINE WITHOUT AND WITH CONTRAST TECHNIQUE: Multiplanar and multiecho pulse sequences of  the cervical spine, to include the craniocervical junction and cervicothoracic junction, and thoracic and lumbar spine, were obtained without and with intravenous contrast. CONTRAST:  632mGADAVIST GADOBUTROL 1 MMOL/ML IV SOLN COMPARISON:  None. FINDINGS: MRI CERVICAL SPINE FINDINGS Alignment: Physiologic. Vertebrae: No fracture, evidence of discitis, or bone lesion. Cord: Normal signal and morphology. Posterior Fossa, vertebral arteries, paraspinal tissues: Negative. Disc levels: C2-3: Unremarkable. C3-4: Small central disc protrusion without stenosis. C4-5: Left facet hypertrophy.  No stenosis. C5-6: Small right subarticular disc protrusion indenting the ventral spinal cord. No spinal canal stenosis. C6-7: Small disc bulge narrowing the ventral thecal sac. Mild spinal canal stenosis. C7-T1: Unremarkable MRI THORACIC SPINE FINDINGS Alignment:  Physiologic. Vertebrae: There is a paraspinal mass on the left at T2-3 that also involves the left half of T2 vertebral body and the left pedicle. The lesion is diffusely contrast-enhancing. Otherwise, the thoracic vertebral bodies are normal. Cord:  Normal signal and morphology.  No epidural collection. Paraspinal and other soft tissues: Biapical pulmonary scarring. Left basilar opacity. Disc levels: No spinal canal or neural foraminal stenosis. MRI LUMBAR SPINE  FINDINGS Segmentation:  Standard Alignment:  Grade 1 anterolisthesis at L4-5 Vertebrae:  No fracture, evidence of discitis, or bone lesion. Conus medullaris and cauda equina: Conus extends to the L1 level. Conus and cauda equina appear normal. Paraspinal and other soft tissues: Negative Disc levels: L1-2: Normal. L2-3: Small disc bulge without stenosis. L3-4: Small disc bulge and mild facet hypertrophy without stenosis. L4-5: Small disc bulge and moderate facet hypertrophy with mild spinal canal stenosis and mild left foraminal stenosis. L5-S1: Left asymmetric disc bulge and mild facet hypertrophy without stenosis.  IMPRESSION: 1. No acute abnormality of the cervical, thoracic or lumbar spine. 2. Left paraspinal mass at T2-3 with involvement of the left pedicle. This could indicate extra medullary hematopoiesis, a nerve sheath tumor or a metastasis from an unknown primary. The etiology is unclear and PET/CT may be helpful in further assessing the probability of metastatic disease. 3. Mild cervical and lumbar degenerative disc disease with mild spinal canal stenosis at C6-7. Electronically Signed   By: Ulyses Jarred M.D.   On: 11/16/2020 02:14   MR THORACIC SPINE W WO CONTRAST  Result Date: 11/16/2020 CLINICAL DATA:  Found unresponsive EXAM: MRI CERVICAL, THORACIC AND LUMBAR SPINE WITHOUT AND WITH CONTRAST TECHNIQUE: Multiplanar and multiecho pulse sequences of the cervical spine, to include the craniocervical junction and cervicothoracic junction, and thoracic and lumbar spine, were obtained without and with intravenous contrast. CONTRAST:  17m GADAVIST GADOBUTROL 1 MMOL/ML IV SOLN COMPARISON:  None. FINDINGS: MRI CERVICAL SPINE FINDINGS Alignment: Physiologic. Vertebrae: No fracture, evidence of discitis, or bone lesion. Cord: Normal signal and morphology. Posterior Fossa, vertebral arteries, paraspinal tissues: Negative. Disc levels: C2-3: Unremarkable. C3-4: Small central disc protrusion without stenosis. C4-5: Left facet hypertrophy.  No stenosis. C5-6: Small right subarticular disc protrusion indenting the ventral spinal cord. No spinal canal stenosis. C6-7: Small disc bulge narrowing the ventral thecal sac. Mild spinal canal stenosis. C7-T1: Unremarkable MRI THORACIC SPINE FINDINGS Alignment:  Physiologic. Vertebrae: There is a paraspinal mass on the left at T2-3 that also involves the left half of T2 vertebral body and the left pedicle. The lesion is diffusely contrast-enhancing. Otherwise, the thoracic vertebral bodies are normal. Cord:  Normal signal and morphology.  No epidural collection. Paraspinal and other  soft tissues: Biapical pulmonary scarring. Left basilar opacity. Disc levels: No spinal canal or neural foraminal stenosis. MRI LUMBAR SPINE FINDINGS Segmentation:  Standard Alignment:  Grade 1 anterolisthesis at L4-5 Vertebrae:  No fracture, evidence of discitis, or bone lesion. Conus medullaris and cauda equina: Conus extends to the L1 level. Conus and cauda equina appear normal. Paraspinal and other soft tissues: Negative Disc levels: L1-2: Normal. L2-3: Small disc bulge without stenosis. L3-4: Small disc bulge and mild facet hypertrophy without stenosis. L4-5: Small disc bulge and moderate facet hypertrophy with mild spinal canal stenosis and mild left foraminal stenosis. L5-S1: Left asymmetric disc bulge and mild facet hypertrophy without stenosis. IMPRESSION: 1. No acute abnormality of the cervical, thoracic or lumbar spine. 2. Left paraspinal mass at T2-3 with involvement of the left pedicle. This could indicate extra medullary hematopoiesis, a nerve sheath tumor or a metastasis from an unknown primary. The etiology is unclear and PET/CT may be helpful in further assessing the probability of metastatic disease. 3. Mild cervical and lumbar degenerative disc disease with mild spinal canal stenosis at C6-7. Electronically Signed   By: KUlyses JarredM.D.   On: 11/16/2020 02:14   MR Lumbar Spine W Wo Contrast  Result Date: 11/16/2020 CLINICAL DATA:  Found unresponsive EXAM: MRI CERVICAL, THORACIC AND LUMBAR SPINE WITHOUT AND WITH CONTRAST TECHNIQUE: Multiplanar and multiecho pulse sequences of the cervical spine, to include the craniocervical junction and cervicothoracic junction, and thoracic and lumbar spine, were obtained without and with intravenous contrast. CONTRAST:  53m GADAVIST GADOBUTROL 1 MMOL/ML IV SOLN COMPARISON:  None. FINDINGS: MRI CERVICAL SPINE FINDINGS Alignment: Physiologic. Vertebrae: No fracture, evidence of discitis, or bone lesion. Cord: Normal signal and morphology. Posterior Fossa,  vertebral arteries, paraspinal tissues: Negative. Disc levels: C2-3: Unremarkable. C3-4: Small central disc protrusion without stenosis. C4-5: Left facet hypertrophy.  No stenosis. C5-6: Small right subarticular disc protrusion indenting the ventral spinal cord. No spinal canal stenosis. C6-7: Small disc bulge narrowing the ventral thecal sac. Mild spinal canal stenosis. C7-T1: Unremarkable MRI THORACIC SPINE FINDINGS Alignment:  Physiologic. Vertebrae: There is a paraspinal mass on the left at T2-3 that also involves the left half of T2 vertebral body and the left pedicle. The lesion is diffusely contrast-enhancing. Otherwise, the thoracic vertebral bodies are normal. Cord:  Normal signal and morphology.  No epidural collection. Paraspinal and other soft tissues: Biapical pulmonary scarring. Left basilar opacity. Disc levels: No spinal canal or neural foraminal stenosis. MRI LUMBAR SPINE FINDINGS Segmentation:  Standard Alignment:  Grade 1 anterolisthesis at L4-5 Vertebrae:  No fracture, evidence of discitis, or bone lesion. Conus medullaris and cauda equina: Conus extends to the L1 level. Conus and cauda equina appear normal. Paraspinal and other soft tissues: Negative Disc levels: L1-2: Normal. L2-3: Small disc bulge without stenosis. L3-4: Small disc bulge and mild facet hypertrophy without stenosis. L4-5: Small disc bulge and moderate facet hypertrophy with mild spinal canal stenosis and mild left foraminal stenosis. L5-S1: Left asymmetric disc bulge and mild facet hypertrophy without stenosis. IMPRESSION: 1. No acute abnormality of the cervical, thoracic or lumbar spine. 2. Left paraspinal mass at T2-3 with involvement of the left pedicle. This could indicate extra medullary hematopoiesis, a nerve sheath tumor or a metastasis from an unknown primary. The etiology is unclear and PET/CT may be helpful in further assessing the probability of metastatic disease. 3. Mild cervical and lumbar degenerative disc  disease with mild spinal canal stenosis at C6-7. Electronically Signed   By: KUlyses JarredM.D.   On: 11/16/2020 02:14   DG Chest Port 1 View  Result Date: 11/15/2020 CLINICAL DATA:  Fever, unresponsive EXAM: PORTABLE CHEST 1 VIEW COMPARISON:  03/05/2020 FINDINGS: Mild left lower lobe opacity, likely atelectasis, less likely pneumonia. Right lung is clear. No pleural effusion or pneumothorax. Heart is normal in size.  Thoracic aortic atherosclerosis. Right chest power port terminates in the upper right atrium. IMPRESSION: Mild left lower lobe opacity, likely atelectasis, less likely pneumonia. Electronically Signed   By: SJulian HyM.D.   On: 11/15/2020 21:19   CT CHEST ABDOMEN PELVIS WO CONTRAST  Result Date: 11/15/2020 CLINICAL DATA:  Found unresponsive. EXAM: CT CHEST, ABDOMEN AND PELVIS WITHOUT CONTRAST TECHNIQUE: Multidetector CT imaging of the chest, abdomen and pelvis was performed following the standard protocol without IV contrast. COMPARISON:  September 29, 2019 FINDINGS: CT CHEST FINDINGS Cardiovascular: A right-sided venous Port-A-Cath is in place. Moderate severity calcification of the aortic arch is noted without evidence of aortic aneurysm. Normal heart size. No pericardial effusion. Mediastinum/Nodes: No enlarged mediastinal, hilar, or axillary lymph nodes. Thyroid gland, trachea, and esophagus demonstrate no significant findings. Lungs/Pleura: Mild-to-moderate severity central lobular emphysema is seen with stable, marked severity, partially calcified areas of scarring and/or atelectasis are seen within the  bilateral apices. Mild to moderate severity infiltrate is seen within the anterior aspect of the left lung base. There is no evidence of a pleural effusion or pneumothorax. Musculoskeletal: Multilevel degenerative changes are seen throughout the thoracic spine. CT ABDOMEN PELVIS FINDINGS Hepatobiliary: No focal liver abnormality is seen. No gallstones, gallbladder wall thickening,  or biliary dilatation. Pancreas: Unremarkable. No pancreatic ductal dilatation or surrounding inflammatory changes. Spleen: Normal in size without focal abnormality. Adrenals/Urinary Tract: Adrenal glands are unremarkable. Kidneys are normal, without renal calculi, focal lesion, or hydronephrosis. Bladder is unremarkable. Stomach/Bowel: Stomach is within normal limits. Appendix appears normal. Stool is seen throughout the large bowel. No evidence of bowel wall thickening, distention, or inflammatory changes. Vascular/Lymphatic: Aortic atherosclerosis. No enlarged abdominal or pelvic lymph nodes. Reproductive: Status post hysterectomy. No adnexal masses. Other: No abdominal wall hernia or abnormality. No abdominopelvic ascites. Musculoskeletal: Multilevel degenerative changes are seen throughout the lumbar spine. IMPRESSION: 1. Mild-to-moderate severity left lower lobe infiltrate. 2. Emphysematous lung disease with stable, marked severity, partially calcified areas of biapical scarring and/or atelectasis. 3. No acute or active process within the abdomen or pelvis. 4. Aortic atherosclerosis. Aortic Atherosclerosis (ICD10-I70.0) and Emphysema (ICD10-J43.9). Electronically Signed   By: Virgina Norfolk M.D.   On: 11/15/2020 22:04      Subjective: Patient seen and examined on the day of discharge.  Mental status baseline.  Patient ambulated without difficulty.  Stable for discharge home at this time.  Discharge Exam: Vitals:   11/18/20 0651 11/18/20 0829  BP: (!) 117/55 134/73  Pulse: 73 70  Resp: 16 18  Temp: 97.6 F (36.4 C) 98 F (36.7 C)  SpO2: 97% 98%   Vitals:   11/17/20 2050 11/17/20 2112 11/18/20 0651 11/18/20 0829  BP: 113/68 121/64 (!) 117/55 134/73  Pulse: 80 78 73 70  Resp: '15  16 18  ' Temp: 98.4 F (36.9 C) 98 F (36.7 C) 97.6 F (36.4 C) 98 F (36.7 C)  TempSrc: Oral Oral Oral Oral  SpO2: 97% 97% 97% 98%  Weight:      Height:        General: Pt is alert, awake, not in acute  distress Cardiovascular: RRR, S1/S2 +, no rubs, no gallops Respiratory: CTA bilaterally, no wheezing, no rhonchi Abdominal: Soft, NT, ND, bowel sounds + Extremities: no edema, no cyanosis    The results of significant diagnostics from this hospitalization (including imaging, microbiology, ancillary and laboratory) are listed below for reference.     Microbiology: Recent Results (from the past 240 hour(s))  Resp Panel by RT-PCR (Flu A&B, Covid) Nasopharyngeal Swab     Status: Abnormal   Collection Time: 11/15/20  9:03 PM   Specimen: Nasopharyngeal Swab; Nasopharyngeal(NP) swabs in vial transport medium  Result Value Ref Range Status   SARS Coronavirus 2 by RT PCR POSITIVE (A) NEGATIVE Final    Comment: RESULT CALLED TO, READ BACK BY AND VERIFIED WITH: TIA BULLOCK RN [id number] 11/15/20 HNM (NOTE) SARS-CoV-2 target nucleic acids are DETECTED.  The SARS-CoV-2 RNA is generally detectable in upper respiratory specimens during the acute phase of infection. Positive results are indicative of the presence of the identified virus, but do not rule out bacterial infection or co-infection with other pathogens not detected by the test. Clinical correlation with patient history and other diagnostic information is necessary to determine patient infection status. The expected result is Negative.  Fact Sheet for Patients: EntrepreneurPulse.com.au  Fact Sheet for Healthcare Providers: IncredibleEmployment.be  This test is not yet approved or cleared by  the [employer] and  has been authorized for detection and/or diagnosis of SARS-CoV-2 by FDA under an Emergency Use Authorization (EUA).  This EUA will remain in effect (meaning this test can be u sed) for the duration of  the COVID-19 declaration under Section 564(b)(1) of the Act, 21 U.S.C. section 360bbb-3(b)(1), unless the authorization is terminated or revoked sooner.     Influenza A by PCR NEGATIVE  NEGATIVE Final   Influenza B by PCR NEGATIVE NEGATIVE Final    Comment: (NOTE) The Xpert Xpress SARS-CoV-2/FLU/RSV plus assay is intended as an aid in the diagnosis of influenza from Nasopharyngeal swab specimens and should not be used as a sole basis for treatment. Nasal washings and aspirates are unacceptable for Xpert Xpress SARS-CoV-2/FLU/RSV testing.  Fact Sheet for Patients: EntrepreneurPulse.com.au  Fact Sheet for Healthcare Providers: IncredibleEmployment.be  This test is not yet approved or cleared by the [place] FDA and has been authorized for detection and/or diagnosis of SARS-CoV-2 by FDA under an Emergency Use Authorization (EUA). This EUA will remain in effect (meaning this test can be used) for the duration of the COVID-19 declaration under Section 564(b)(1) of the Act, 21 U.S.C. section 360bbb-3(b)(1), unless the authorization is terminated or revoked.  Performed at [facility] [facility], [address] [place], [place] [address]   Blood Culture (routine x 2)     Status: None (Preliminary result)   Collection Time: 11/15/20  9:03 PM   Specimen: BLOOD  Result Value Ref Range Status   Specimen Description BLOOD LEFT ASSIST CONTROL  Final   Special Requests   Final    BOTTLES DRAWN AEROBIC AND ANAEROBIC Blood Culture results may not be optimal due to an inadequate volume of blood received in culture bottles   Culture   Final    NO GROWTH 3 DAYS Performed at [facility], [address] [place], [place] [address]    Report Status PENDING  Incomplete  Urine Culture     Status: None   Collection Time: 11/15/20  9:03 PM   Specimen: Urine, Random  Result Value Ref Range Status   Specimen Description   Final    URINE, RANDOM Performed at [facility], [address], [place], [place] [address]    Special Requests   Final    NONE Performed at [facility], [address], [place],  [place] [address]    Culture   Final    NO GROWTH Performed at [facility], [place] [address], [place], [place] [address]    Report Status 11/16/2020 FINAL  Final  Blood Culture (routine x 2)     Status: None (Preliminary result)   Collection Time: 11/15/20 10:09 PM   Specimen: BLOOD  Result Value Ref Range Status   Specimen Description BLOOD LEFT HAND  Final   Special Requests   Final    BOTTLES DRAWN AEROBIC AND ANAEROBIC Blood Culture adequate volume   Culture   Final    NO GROWTH 3 DAYS Performed at [facility], [address], [place], [place] [address]    Report Status PENDING  Incomplete  Respiratory (~20 pathogens) panel by PCR     Status: None   Collection Time: 11/16/20  1:45 AM   Specimen: Nasopharyngeal Swab; Respiratory  Result Value Ref Range Status   Adenovirus NOT DETECTED NOT DETECTED Final   Coronavirus 229E NOT DETECTED NOT DETECTED Final    Comment: (NOTE) The Coronavirus on the Respiratory Panel, DOES NOT  test for the novel  Coronavirus (2019 nCoV)    Coronavirus HKU1 NOT DETECTED NOT DETECTED Final   Coronavirus NL63 NOT DETECTED NOT DETECTED Final   Coronavirus OC43 NOT DETECTED NOT DETECTED Final   Metapneumovirus NOT DETECTED NOT DETECTED Final   Rhinovirus / Enterovirus NOT DETECTED NOT DETECTED Final   Influenza A NOT DETECTED NOT DETECTED Final   Influenza B NOT DETECTED NOT DETECTED Final   Parainfluenza Virus 1 NOT DETECTED NOT DETECTED Final   Parainfluenza Virus 2 NOT DETECTED NOT DETECTED Final   Parainfluenza Virus 3 NOT DETECTED NOT DETECTED Final   Parainfluenza Virus 4 NOT DETECTED NOT DETECTED Final   Respiratory Syncytial Virus NOT DETECTED NOT DETECTED Final   Bordetella pertussis NOT DETECTED NOT DETECTED Final   Bordetella Parapertussis NOT DETECTED NOT DETECTED Final   Chlamydophila pneumoniae NOT DETECTED NOT DETECTED Final   Mycoplasma pneumoniae NOT DETECTED NOT DETECTED Final    Comment: Performed at [facility], [place] [address] [place], [place] [address]  MRSA Next Gen by PCR, Nasal     Status: None   Collection Time: 11/17/20 10:31 AM   Specimen: Nasal Mucosa; Nasal Swab  Result Value Ref Range Status   MRSA by PCR Next Gen NOT DETECTED NOT DETECTED Final    Comment: (NOTE) The GeneXpert MRSA Assay (FDA approved for NASAL specimens only), is one component of a comprehensive MRSA colonization surveillance program. It is not intended to diagnose MRSA infection nor to guide or monitor treatment for MRSA infections. Test performance is not FDA approved in patients less than 51 years old. Performed at [facility], [address] [place], [place] [address]      Labs: BNP (last 3 results) No results for input(s): BNP in the last 8760 hours. Basic Metabolic Panel: Recent Labs  Lab 11/15/20 2103 11/16/20 0429  NA 137 137  K 4.0 3.8  CL 97* 101  CO2 27 28  GLUCOSE 170* 152*  BUN 18 17  CREATININE 0.91 0.69  CALCIUM 9.0 8.2*   Liver Function Tests: Recent Labs  Lab 11/15/20 2103  AST 17  ALT 23  ALKPHOS 81  BILITOT 0.9  PROT 7.3  ALBUMIN 3.5   Recent Labs  Lab 11/15/20 2103  LIPASE 44   Recent Labs  Lab 11/15/20 2209  AMMONIA 13   CBC: Recent Labs  Lab 11/15/20 2103 11/16/20 0429  WBC 9.1 5.8  NEUTROABS 7.0  --   HGB 13.6 12.0  HCT 41.4 35.8*  MCV 96.1 97.5  PLT 140* 116*   Cardiac Enzymes: Recent Labs  Lab 11/15/20 2103  CKTOTAL 32*   BNP: Invalid input(s): POCBNP CBG: Recent Labs  Lab 11/17/20 0833 11/17/20 1139 11/17/20 1706 11/17/20 2108 11/18/20 0827  GLUCAP 208* 252* 321* 277* 179*   D-Dimer No results for input(s): DDIMER in the last 72 hours. Hgb A1c Recent Labs    11/15/20 2103  HGBA1C 8.0*   Lipid Profile No results for input(s): CHOL, HDL, LDLCALC, TRIG, CHOLHDL, LDLDIRECT in the last 72 hours. Thyroid function studies No results for input(s): TSH, T4TOTAL, T3FREE, THYROIDAB in the last 72 hours.  Invalid input(s):  FREET3 Anemia work up No results for input(s): VITAMINB12, FOLATE, FERRITIN, TIBC, IRON, RETICCTPCT in the last 72 hours. Urinalysis    Component Value Date/Time   COLORURINE YELLOW (A) 11/15/2020 2103   APPEARANCEUR HAZY (A) 11/15/2020 2103   APPEARANCEUR Clear 06/05/2016 1003   LABSPEC 1.024 11/15/2020 2103   PHURINE 5.0  11/15/2020 2103   GLUCOSEU >=500 (A) 11/15/2020 2103   HGBUR SMALL (A) 11/15/2020 2103   BILIRUBINUR NEGATIVE 11/15/2020 2103   BILIRUBINUR negative 02/16/2017 1628   BILIRUBINUR Negative 06/05/2016 [place] 11/15/2020 2103   PROTEINUR 30 (A) 11/15/2020 2103   UROBILINOGEN 0.2 02/16/2017 1628   UROBILINOGEN 0.2 01/24/2010 1052   NITRITE NEGATIVE 11/15/2020 2103   LEUKOCYTESUR NEGATIVE 11/15/2020 2103   Sepsis Labs Invalid input(s): PROCALCITONIN,  WBC,  LACTICIDVEN Microbiology Recent Results (from the past 240 hour(s))  Resp Panel by RT-PCR (Flu A&B, Covid) Nasopharyngeal Swab     Status: Abnormal   Collection Time: 11/15/20  9:03 PM   Specimen: Nasopharyngeal Swab; Nasopharyngeal(NP) swabs in vial transport medium  Result Value Ref Range Status   SARS Coronavirus 2 by RT PCR POSITIVE (A) NEGATIVE Final    Comment: RESULT CALLED TO, READ BACK BY AND VERIFIED WITH: TIA BULLOCK RN [id number] 11/15/20 HNM (NOTE) SARS-CoV-2 target nucleic acids are DETECTED.  The SARS-CoV-2 RNA is generally detectable in upper respiratory specimens during the acute phase of infection. Positive results are indicative of the presence of the identified virus, but do not rule out bacterial infection or co-infection with other pathogens not detected by the test. Clinical correlation with patient history and other diagnostic information is necessary to determine patient infection status. The expected result is Negative.  Fact Sheet for Patients: EntrepreneurPulse.com.au  Fact Sheet for Healthcare  Providers: IncredibleEmployment.be  This test is not yet approved or cleared by the [place] FDA and  has been authorized for detection and/or diagnosis of SARS-CoV-2 by FDA under an Emergency Use Authorization (EUA).  This EUA will remain in effect (meaning this test can be u sed) for the duration of  the COVID-19 declaration under Section 564(b)(1) of the Act, 21 U.S.C. section 360bbb-3(b)(1), unless the authorization is terminated or revoked sooner.     Influenza A by PCR NEGATIVE NEGATIVE Final   Influenza B by PCR NEGATIVE NEGATIVE Final    Comment: (NOTE) The Xpert Xpress SARS-CoV-2/FLU/RSV plus assay is intended as an aid in the diagnosis of influenza from Nasopharyngeal swab specimens and should not be used as a sole basis for treatment. Nasal washings and aspirates are unacceptable for Xpert Xpress SARS-CoV-2/FLU/RSV testing.  Fact Sheet for Patients: EntrepreneurPulse.com.au  Fact Sheet for Healthcare Providers: IncredibleEmployment.be  This test is not yet approved or cleared by the [place] FDA and has been authorized for detection and/or diagnosis of SARS-CoV-2 by FDA under an Emergency Use Authorization (EUA). This EUA will remain in effect (meaning this test can be used) for the duration of the COVID-19 declaration under Section 564(b)(1) of the Act, 21 U.S.C. section 360bbb-3(b)(1), unless the authorization is terminated or revoked.  Performed at [facility], [address] [place], [place] [address]   Blood Culture (routine x 2)     Status: None (Preliminary result)   Collection Time: 11/15/20  9:03 PM   Specimen: BLOOD  Result Value Ref Range Status   Specimen Description BLOOD LEFT ASSIST CONTROL  Final   Special Requests   Final    BOTTLES DRAWN AEROBIC AND ANAEROBIC Blood Culture results may not be optimal due to an inadequate volume of blood received in culture bottles    Culture   Final    NO GROWTH 3 DAYS Performed at [facility], [address], [place],  [address]    Report Status PENDING  Incomplete  Urine Culture  Status: None   Collection Time: 11/15/20  9:03 PM   Specimen: Urine, Random  Result Value Ref Range Status   Specimen Description   Final    URINE, RANDOM Performed at [facility], [address] [place], [place] [address]    Special Requests   Final    NONE Performed at [facility], [address] [place], [place] [address]    Culture   Final    NO GROWTH Performed at [facility], [place] [address] [place], [place] [address]    Report Status 11/16/2020 FINAL  Final  Blood Culture (routine x 2)     Status: None (Preliminary result)   Collection Time: 11/15/20 10:09 PM   Specimen: BLOOD  Result Value Ref Range Status   Specimen Description BLOOD LEFT HAND  Final   Special Requests   Final    BOTTLES DRAWN AEROBIC AND ANAEROBIC Blood Culture adequate volume   Culture   Final    NO GROWTH 3 DAYS Performed at [facility], [address] [place], [place] [address]    Report Status PENDING  Incomplete  Respiratory (~20 pathogens) panel by PCR     Status: None   Collection Time: 11/16/20  1:45 AM   Specimen: Nasopharyngeal Swab; Respiratory  Result Value Ref Range Status   Adenovirus NOT DETECTED NOT DETECTED Final   Coronavirus 229E NOT DETECTED NOT DETECTED Final    Comment: (NOTE) The Coronavirus on the Respiratory Panel, DOES NOT test for the novel  Coronavirus (2019 nCoV)    Coronavirus HKU1 NOT DETECTED NOT DETECTED Final   Coronavirus NL63 NOT DETECTED NOT DETECTED Final   Coronavirus OC43 NOT DETECTED NOT DETECTED Final   Metapneumovirus NOT DETECTED NOT DETECTED Final   Rhinovirus / Enterovirus NOT DETECTED NOT DETECTED Final   Influenza A NOT DETECTED NOT DETECTED Final   Influenza B NOT DETECTED NOT DETECTED Final   Parainfluenza Virus 1 NOT DETECTED  NOT DETECTED Final   Parainfluenza Virus 2 NOT DETECTED NOT DETECTED Final   Parainfluenza Virus 3 NOT DETECTED NOT DETECTED Final   Parainfluenza Virus 4 NOT DETECTED NOT DETECTED Final   Respiratory Syncytial Virus NOT DETECTED NOT DETECTED Final   Bordetella pertussis NOT DETECTED NOT DETECTED Final   Bordetella Parapertussis NOT DETECTED NOT DETECTED Final   Chlamydophila pneumoniae NOT DETECTED NOT DETECTED Final   Mycoplasma pneumoniae NOT DETECTED NOT DETECTED Final    Comment: Performed at [facility], [place], [place], [place] [address]  MRSA Next Gen by PCR, Nasal     Status: None   Collection Time: 11/17/20 10:31 AM   Specimen: Nasal Mucosa; Nasal Swab  Result Value Ref Range Status   MRSA by PCR Next Gen NOT DETECTED NOT DETECTED Final    Comment: (NOTE) The GeneXpert MRSA Assay (FDA approved for NASAL specimens only), is one component of a comprehensive MRSA colonization surveillance program. It is not intended to diagnose MRSA infection nor to guide or monitor treatment for MRSA infections. Test performance is not FDA approved in patients less than 51 years old. Performed at [facility], [address], [place], [place] [address]      Time coordinating discharge: Over 30 minutes  SIGNED:   Sidney Ace, MD  [facility] 11/18/2020, 1:47 PM Pager   If 7PM-7AM, please contact night-coverage

## 2020-11-20 DIAGNOSIS — Z8661 Personal history of infections of the central nervous system: Secondary | ICD-10-CM | POA: Diagnosis not present

## 2020-11-20 DIAGNOSIS — J189 Pneumonia, unspecified organism: Secondary | ICD-10-CM | POA: Diagnosis not present

## 2020-11-20 DIAGNOSIS — Z8619 Personal history of other infectious and parasitic diseases: Secondary | ICD-10-CM | POA: Diagnosis not present

## 2020-11-20 LAB — CULTURE, BLOOD (ROUTINE X 2)
Culture: NO GROWTH
Culture: NO GROWTH
Special Requests: ADEQUATE

## 2020-11-22 DIAGNOSIS — C8299 Follicular lymphoma, unspecified, extranodal and solid organ sites: Secondary | ICD-10-CM | POA: Diagnosis not present

## 2020-11-22 DIAGNOSIS — G893 Neoplasm related pain (acute) (chronic): Secondary | ICD-10-CM | POA: Diagnosis not present

## 2020-11-22 DIAGNOSIS — Z510 Encounter for antineoplastic radiation therapy: Secondary | ICD-10-CM | POA: Diagnosis not present

## 2020-11-22 DIAGNOSIS — C8331 Diffuse large B-cell lymphoma, lymph nodes of head, face, and neck: Secondary | ICD-10-CM | POA: Diagnosis not present

## 2020-11-22 DIAGNOSIS — Z9484 Stem cells transplant status: Secondary | ICD-10-CM | POA: Diagnosis not present

## 2020-11-22 DIAGNOSIS — R739 Hyperglycemia, unspecified: Secondary | ICD-10-CM | POA: Diagnosis not present

## 2020-11-22 DIAGNOSIS — R400 Somnolence: Secondary | ICD-10-CM | POA: Diagnosis not present

## 2020-11-22 DIAGNOSIS — M549 Dorsalgia, unspecified: Secondary | ICD-10-CM | POA: Diagnosis not present

## 2020-11-22 DIAGNOSIS — C8330 Diffuse large B-cell lymphoma, unspecified site: Secondary | ICD-10-CM | POA: Diagnosis not present

## 2020-11-26 DIAGNOSIS — C8330 Diffuse large B-cell lymphoma, unspecified site: Secondary | ICD-10-CM | POA: Diagnosis not present

## 2020-11-26 DIAGNOSIS — C8331 Diffuse large B-cell lymphoma, lymph nodes of head, face, and neck: Secondary | ICD-10-CM | POA: Diagnosis not present

## 2020-11-26 DIAGNOSIS — Z510 Encounter for antineoplastic radiation therapy: Secondary | ICD-10-CM | POA: Diagnosis not present

## 2020-11-27 DIAGNOSIS — Z510 Encounter for antineoplastic radiation therapy: Secondary | ICD-10-CM | POA: Diagnosis not present

## 2020-11-27 DIAGNOSIS — C8331 Diffuse large B-cell lymphoma, lymph nodes of head, face, and neck: Secondary | ICD-10-CM | POA: Diagnosis not present

## 2020-11-27 DIAGNOSIS — C8330 Diffuse large B-cell lymphoma, unspecified site: Secondary | ICD-10-CM | POA: Diagnosis not present

## 2020-11-28 DIAGNOSIS — C8330 Diffuse large B-cell lymphoma, unspecified site: Secondary | ICD-10-CM | POA: Diagnosis not present

## 2020-11-28 DIAGNOSIS — Z510 Encounter for antineoplastic radiation therapy: Secondary | ICD-10-CM | POA: Diagnosis not present

## 2020-11-28 DIAGNOSIS — C8331 Diffuse large B-cell lymphoma, lymph nodes of head, face, and neck: Secondary | ICD-10-CM | POA: Diagnosis not present

## 2020-11-29 DIAGNOSIS — C8331 Diffuse large B-cell lymphoma, lymph nodes of head, face, and neck: Secondary | ICD-10-CM | POA: Diagnosis not present

## 2020-11-29 DIAGNOSIS — C8330 Diffuse large B-cell lymphoma, unspecified site: Secondary | ICD-10-CM | POA: Diagnosis not present

## 2020-11-29 DIAGNOSIS — Z510 Encounter for antineoplastic radiation therapy: Secondary | ICD-10-CM | POA: Diagnosis not present

## 2020-11-30 DIAGNOSIS — C8330 Diffuse large B-cell lymphoma, unspecified site: Secondary | ICD-10-CM | POA: Diagnosis not present

## 2020-11-30 DIAGNOSIS — C8331 Diffuse large B-cell lymphoma, lymph nodes of head, face, and neck: Secondary | ICD-10-CM | POA: Diagnosis not present

## 2020-11-30 DIAGNOSIS — Z510 Encounter for antineoplastic radiation therapy: Secondary | ICD-10-CM | POA: Diagnosis not present

## 2020-12-03 DIAGNOSIS — Z6822 Body mass index (BMI) 22.0-22.9, adult: Secondary | ICD-10-CM | POA: Diagnosis not present

## 2020-12-03 DIAGNOSIS — Z79899 Other long term (current) drug therapy: Secondary | ICD-10-CM | POA: Diagnosis not present

## 2020-12-03 DIAGNOSIS — Z510 Encounter for antineoplastic radiation therapy: Secondary | ICD-10-CM | POA: Diagnosis not present

## 2020-12-03 DIAGNOSIS — C8331 Diffuse large B-cell lymphoma, lymph nodes of head, face, and neck: Secondary | ICD-10-CM | POA: Diagnosis not present

## 2020-12-03 DIAGNOSIS — F17210 Nicotine dependence, cigarettes, uncomplicated: Secondary | ICD-10-CM | POA: Diagnosis not present

## 2020-12-03 DIAGNOSIS — E119 Type 2 diabetes mellitus without complications: Secondary | ICD-10-CM | POA: Diagnosis not present

## 2020-12-03 DIAGNOSIS — E559 Vitamin D deficiency, unspecified: Secondary | ICD-10-CM | POA: Diagnosis not present

## 2020-12-03 DIAGNOSIS — F1120 Opioid dependence, uncomplicated: Secondary | ICD-10-CM | POA: Diagnosis not present

## 2020-12-03 DIAGNOSIS — C8590 Non-Hodgkin lymphoma, unspecified, unspecified site: Secondary | ICD-10-CM | POA: Diagnosis not present

## 2020-12-03 DIAGNOSIS — Z9181 History of falling: Secondary | ICD-10-CM | POA: Diagnosis not present

## 2020-12-03 DIAGNOSIS — C8330 Diffuse large B-cell lymphoma, unspecified site: Secondary | ICD-10-CM | POA: Diagnosis not present

## 2020-12-04 DIAGNOSIS — Z510 Encounter for antineoplastic radiation therapy: Secondary | ICD-10-CM | POA: Diagnosis not present

## 2020-12-04 DIAGNOSIS — C8330 Diffuse large B-cell lymphoma, unspecified site: Secondary | ICD-10-CM | POA: Diagnosis not present

## 2020-12-04 DIAGNOSIS — C8331 Diffuse large B-cell lymphoma, lymph nodes of head, face, and neck: Secondary | ICD-10-CM | POA: Diagnosis not present

## 2020-12-05 DIAGNOSIS — C8331 Diffuse large B-cell lymphoma, lymph nodes of head, face, and neck: Secondary | ICD-10-CM | POA: Diagnosis not present

## 2020-12-05 DIAGNOSIS — F1120 Opioid dependence, uncomplicated: Secondary | ICD-10-CM | POA: Diagnosis not present

## 2020-12-05 DIAGNOSIS — C8330 Diffuse large B-cell lymphoma, unspecified site: Secondary | ICD-10-CM | POA: Diagnosis not present

## 2020-12-05 DIAGNOSIS — Z510 Encounter for antineoplastic radiation therapy: Secondary | ICD-10-CM | POA: Diagnosis not present

## 2020-12-07 DIAGNOSIS — C8330 Diffuse large B-cell lymphoma, unspecified site: Secondary | ICD-10-CM | POA: Diagnosis not present

## 2020-12-07 DIAGNOSIS — Z510 Encounter for antineoplastic radiation therapy: Secondary | ICD-10-CM | POA: Diagnosis not present

## 2020-12-07 DIAGNOSIS — C8331 Diffuse large B-cell lymphoma, lymph nodes of head, face, and neck: Secondary | ICD-10-CM | POA: Diagnosis not present

## 2020-12-08 DIAGNOSIS — J45901 Unspecified asthma with (acute) exacerbation: Secondary | ICD-10-CM | POA: Diagnosis not present

## 2020-12-10 DIAGNOSIS — C8299 Follicular lymphoma, unspecified, extranodal and solid organ sites: Secondary | ICD-10-CM | POA: Diagnosis not present

## 2020-12-10 DIAGNOSIS — C8330 Diffuse large B-cell lymphoma, unspecified site: Secondary | ICD-10-CM | POA: Diagnosis not present

## 2020-12-10 DIAGNOSIS — Z9221 Personal history of antineoplastic chemotherapy: Secondary | ICD-10-CM | POA: Diagnosis not present

## 2020-12-10 DIAGNOSIS — Z9484 Stem cells transplant status: Secondary | ICD-10-CM | POA: Diagnosis not present

## 2020-12-10 DIAGNOSIS — Z510 Encounter for antineoplastic radiation therapy: Secondary | ICD-10-CM | POA: Diagnosis not present

## 2020-12-10 DIAGNOSIS — C8331 Diffuse large B-cell lymphoma, lymph nodes of head, face, and neck: Secondary | ICD-10-CM | POA: Diagnosis not present

## 2020-12-10 DIAGNOSIS — Z923 Personal history of irradiation: Secondary | ICD-10-CM | POA: Diagnosis not present

## 2020-12-10 DIAGNOSIS — D649 Anemia, unspecified: Secondary | ICD-10-CM | POA: Diagnosis not present

## 2020-12-11 DIAGNOSIS — C8330 Diffuse large B-cell lymphoma, unspecified site: Secondary | ICD-10-CM | POA: Diagnosis not present

## 2020-12-11 DIAGNOSIS — Z510 Encounter for antineoplastic radiation therapy: Secondary | ICD-10-CM | POA: Diagnosis not present

## 2020-12-11 DIAGNOSIS — C8331 Diffuse large B-cell lymphoma, lymph nodes of head, face, and neck: Secondary | ICD-10-CM | POA: Diagnosis not present

## 2020-12-12 DIAGNOSIS — Z510 Encounter for antineoplastic radiation therapy: Secondary | ICD-10-CM | POA: Diagnosis not present

## 2020-12-12 DIAGNOSIS — C8331 Diffuse large B-cell lymphoma, lymph nodes of head, face, and neck: Secondary | ICD-10-CM | POA: Diagnosis not present

## 2020-12-12 DIAGNOSIS — C8330 Diffuse large B-cell lymphoma, unspecified site: Secondary | ICD-10-CM | POA: Diagnosis not present

## 2020-12-13 DIAGNOSIS — C8588 Other specified types of non-Hodgkin lymphoma, lymph nodes of multiple sites: Secondary | ICD-10-CM | POA: Diagnosis not present

## 2020-12-13 DIAGNOSIS — Z1159 Encounter for screening for other viral diseases: Secondary | ICD-10-CM | POA: Diagnosis not present

## 2020-12-13 DIAGNOSIS — D649 Anemia, unspecified: Secondary | ICD-10-CM | POA: Diagnosis not present

## 2020-12-13 DIAGNOSIS — R2689 Other abnormalities of gait and mobility: Secondary | ICD-10-CM | POA: Diagnosis not present

## 2020-12-13 DIAGNOSIS — U071 COVID-19: Secondary | ICD-10-CM | POA: Diagnosis not present

## 2020-12-13 DIAGNOSIS — R300 Dysuria: Secondary | ICD-10-CM | POA: Diagnosis not present

## 2020-12-13 DIAGNOSIS — R970 Elevated carcinoembryonic antigen [CEA]: Secondary | ICD-10-CM | POA: Diagnosis not present

## 2020-12-17 DIAGNOSIS — C8330 Diffuse large B-cell lymphoma, unspecified site: Secondary | ICD-10-CM | POA: Diagnosis not present

## 2020-12-17 DIAGNOSIS — C8331 Diffuse large B-cell lymphoma, lymph nodes of head, face, and neck: Secondary | ICD-10-CM | POA: Diagnosis not present

## 2020-12-17 DIAGNOSIS — Z510 Encounter for antineoplastic radiation therapy: Secondary | ICD-10-CM | POA: Diagnosis not present

## 2021-01-03 DIAGNOSIS — R42 Dizziness and giddiness: Secondary | ICD-10-CM | POA: Diagnosis not present

## 2021-01-03 DIAGNOSIS — C8331 Diffuse large B-cell lymphoma, lymph nodes of head, face, and neck: Secondary | ICD-10-CM | POA: Diagnosis not present

## 2021-01-03 DIAGNOSIS — Z9221 Personal history of antineoplastic chemotherapy: Secondary | ICD-10-CM | POA: Diagnosis not present

## 2021-01-03 DIAGNOSIS — Z923 Personal history of irradiation: Secondary | ICD-10-CM | POA: Diagnosis not present

## 2021-01-03 DIAGNOSIS — D509 Iron deficiency anemia, unspecified: Secondary | ICD-10-CM | POA: Diagnosis not present

## 2021-01-03 DIAGNOSIS — R079 Chest pain, unspecified: Secondary | ICD-10-CM | POA: Diagnosis not present

## 2021-01-03 DIAGNOSIS — C8338 Diffuse large B-cell lymphoma, lymph nodes of multiple sites: Secondary | ICD-10-CM | POA: Diagnosis not present

## 2021-01-03 DIAGNOSIS — R600 Localized edema: Secondary | ICD-10-CM | POA: Diagnosis not present

## 2021-01-14 DIAGNOSIS — Z6822 Body mass index (BMI) 22.0-22.9, adult: Secondary | ICD-10-CM | POA: Diagnosis not present

## 2021-01-14 DIAGNOSIS — F17210 Nicotine dependence, cigarettes, uncomplicated: Secondary | ICD-10-CM | POA: Diagnosis not present

## 2021-01-14 DIAGNOSIS — F1120 Opioid dependence, uncomplicated: Secondary | ICD-10-CM | POA: Diagnosis not present

## 2021-01-14 DIAGNOSIS — E559 Vitamin D deficiency, unspecified: Secondary | ICD-10-CM | POA: Diagnosis not present

## 2021-01-14 DIAGNOSIS — E119 Type 2 diabetes mellitus without complications: Secondary | ICD-10-CM | POA: Diagnosis not present

## 2021-01-14 DIAGNOSIS — Z9181 History of falling: Secondary | ICD-10-CM | POA: Diagnosis not present

## 2021-01-14 DIAGNOSIS — Z79899 Other long term (current) drug therapy: Secondary | ICD-10-CM | POA: Diagnosis not present

## 2021-01-14 DIAGNOSIS — C8590 Non-Hodgkin lymphoma, unspecified, unspecified site: Secondary | ICD-10-CM | POA: Diagnosis not present

## 2021-01-14 DIAGNOSIS — Z0000 Encounter for general adult medical examination without abnormal findings: Secondary | ICD-10-CM | POA: Diagnosis not present

## 2021-01-16 DIAGNOSIS — F1120 Opioid dependence, uncomplicated: Secondary | ICD-10-CM | POA: Diagnosis not present

## 2021-01-31 DIAGNOSIS — C8330 Diffuse large B-cell lymphoma, unspecified site: Secondary | ICD-10-CM | POA: Diagnosis not present

## 2021-01-31 DIAGNOSIS — R918 Other nonspecific abnormal finding of lung field: Secondary | ICD-10-CM | POA: Diagnosis not present

## 2021-01-31 DIAGNOSIS — C8331 Diffuse large B-cell lymphoma, lymph nodes of head, face, and neck: Secondary | ICD-10-CM | POA: Diagnosis not present

## 2021-01-31 DIAGNOSIS — Z79899 Other long term (current) drug therapy: Secondary | ICD-10-CM | POA: Diagnosis not present

## 2021-01-31 DIAGNOSIS — R5383 Other fatigue: Secondary | ICD-10-CM | POA: Diagnosis not present

## 2021-02-04 DIAGNOSIS — C8330 Diffuse large B-cell lymphoma, unspecified site: Secondary | ICD-10-CM | POA: Diagnosis not present

## 2021-02-08 DIAGNOSIS — R918 Other nonspecific abnormal finding of lung field: Secondary | ICD-10-CM | POA: Diagnosis not present

## 2021-02-11 DIAGNOSIS — F1120 Opioid dependence, uncomplicated: Secondary | ICD-10-CM | POA: Diagnosis not present

## 2021-02-11 DIAGNOSIS — C8590 Non-Hodgkin lymphoma, unspecified, unspecified site: Secondary | ICD-10-CM | POA: Diagnosis not present

## 2021-02-11 DIAGNOSIS — Z9181 History of falling: Secondary | ICD-10-CM | POA: Diagnosis not present

## 2021-02-11 DIAGNOSIS — Z6822 Body mass index (BMI) 22.0-22.9, adult: Secondary | ICD-10-CM | POA: Diagnosis not present

## 2021-02-11 DIAGNOSIS — E119 Type 2 diabetes mellitus without complications: Secondary | ICD-10-CM | POA: Diagnosis not present

## 2021-02-11 DIAGNOSIS — F17210 Nicotine dependence, cigarettes, uncomplicated: Secondary | ICD-10-CM | POA: Diagnosis not present

## 2021-02-11 DIAGNOSIS — E559 Vitamin D deficiency, unspecified: Secondary | ICD-10-CM | POA: Diagnosis not present

## 2021-02-11 DIAGNOSIS — Z79899 Other long term (current) drug therapy: Secondary | ICD-10-CM | POA: Diagnosis not present

## 2021-02-13 DIAGNOSIS — F1120 Opioid dependence, uncomplicated: Secondary | ICD-10-CM | POA: Diagnosis not present

## 2021-02-25 DIAGNOSIS — C8331 Diffuse large B-cell lymphoma, lymph nodes of head, face, and neck: Secondary | ICD-10-CM | POA: Diagnosis not present

## 2021-02-25 DIAGNOSIS — R591 Generalized enlarged lymph nodes: Secondary | ICD-10-CM | POA: Diagnosis not present

## 2021-02-25 DIAGNOSIS — R42 Dizziness and giddiness: Secondary | ICD-10-CM | POA: Diagnosis not present

## 2021-02-25 DIAGNOSIS — K118 Other diseases of salivary glands: Secondary | ICD-10-CM | POA: Diagnosis not present

## 2021-02-25 DIAGNOSIS — R519 Headache, unspecified: Secondary | ICD-10-CM | POA: Diagnosis not present

## 2021-02-25 DIAGNOSIS — I69398 Other sequelae of cerebral infarction: Secondary | ICD-10-CM | POA: Diagnosis not present

## 2021-02-25 DIAGNOSIS — Z95828 Presence of other vascular implants and grafts: Secondary | ICD-10-CM | POA: Diagnosis not present

## 2021-03-11 DIAGNOSIS — K118 Other diseases of salivary glands: Secondary | ICD-10-CM | POA: Diagnosis not present

## 2021-03-11 DIAGNOSIS — C8331 Diffuse large B-cell lymphoma, lymph nodes of head, face, and neck: Secondary | ICD-10-CM | POA: Diagnosis not present

## 2021-03-12 DIAGNOSIS — E119 Type 2 diabetes mellitus without complications: Secondary | ICD-10-CM | POA: Diagnosis not present

## 2021-03-12 DIAGNOSIS — F1120 Opioid dependence, uncomplicated: Secondary | ICD-10-CM | POA: Diagnosis not present

## 2021-03-12 DIAGNOSIS — F17210 Nicotine dependence, cigarettes, uncomplicated: Secondary | ICD-10-CM | POA: Diagnosis not present

## 2021-03-12 DIAGNOSIS — Z6822 Body mass index (BMI) 22.0-22.9, adult: Secondary | ICD-10-CM | POA: Diagnosis not present

## 2021-03-12 DIAGNOSIS — Z79899 Other long term (current) drug therapy: Secondary | ICD-10-CM | POA: Diagnosis not present

## 2021-03-12 DIAGNOSIS — C8590 Non-Hodgkin lymphoma, unspecified, unspecified site: Secondary | ICD-10-CM | POA: Diagnosis not present

## 2021-03-12 DIAGNOSIS — Z9181 History of falling: Secondary | ICD-10-CM | POA: Diagnosis not present

## 2021-03-14 DIAGNOSIS — Z79899 Other long term (current) drug therapy: Secondary | ICD-10-CM | POA: Diagnosis not present

## 2021-03-19 DIAGNOSIS — R42 Dizziness and giddiness: Secondary | ICD-10-CM | POA: Diagnosis not present

## 2021-03-19 DIAGNOSIS — I6523 Occlusion and stenosis of bilateral carotid arteries: Secondary | ICD-10-CM | POA: Diagnosis not present

## 2021-03-26 DIAGNOSIS — I10 Essential (primary) hypertension: Secondary | ICD-10-CM | POA: Diagnosis not present

## 2021-03-26 DIAGNOSIS — F3342 Major depressive disorder, recurrent, in full remission: Secondary | ICD-10-CM | POA: Diagnosis not present

## 2021-03-26 DIAGNOSIS — C7951 Secondary malignant neoplasm of bone: Secondary | ICD-10-CM | POA: Diagnosis not present

## 2021-03-26 DIAGNOSIS — Z9481 Bone marrow transplant status: Secondary | ICD-10-CM | POA: Diagnosis not present

## 2021-03-26 DIAGNOSIS — M4690 Unspecified inflammatory spondylopathy, site unspecified: Secondary | ICD-10-CM | POA: Diagnosis not present

## 2021-03-26 DIAGNOSIS — D692 Other nonthrombocytopenic purpura: Secondary | ICD-10-CM | POA: Diagnosis not present

## 2021-03-26 DIAGNOSIS — E1151 Type 2 diabetes mellitus with diabetic peripheral angiopathy without gangrene: Secondary | ICD-10-CM | POA: Diagnosis not present

## 2021-03-26 DIAGNOSIS — Z7984 Long term (current) use of oral hypoglycemic drugs: Secondary | ICD-10-CM | POA: Diagnosis not present

## 2021-03-26 DIAGNOSIS — J449 Chronic obstructive pulmonary disease, unspecified: Secondary | ICD-10-CM | POA: Diagnosis not present

## 2021-03-26 DIAGNOSIS — C8330 Diffuse large B-cell lymphoma, unspecified site: Secondary | ICD-10-CM | POA: Diagnosis not present

## 2021-04-02 DIAGNOSIS — M47812 Spondylosis without myelopathy or radiculopathy, cervical region: Secondary | ICD-10-CM | POA: Diagnosis not present

## 2021-04-02 DIAGNOSIS — M542 Cervicalgia: Secondary | ICD-10-CM | POA: Diagnosis not present

## 2021-04-02 DIAGNOSIS — M4694 Unspecified inflammatory spondylopathy, thoracic region: Secondary | ICD-10-CM | POA: Diagnosis not present

## 2021-04-02 DIAGNOSIS — S29019A Strain of muscle and tendon of unspecified wall of thorax, initial encounter: Secondary | ICD-10-CM | POA: Diagnosis not present

## 2021-04-02 DIAGNOSIS — M546 Pain in thoracic spine: Secondary | ICD-10-CM | POA: Diagnosis not present

## 2021-04-08 DIAGNOSIS — Z79899 Other long term (current) drug therapy: Secondary | ICD-10-CM | POA: Diagnosis not present

## 2021-04-08 DIAGNOSIS — Z6822 Body mass index (BMI) 22.0-22.9, adult: Secondary | ICD-10-CM | POA: Diagnosis not present

## 2021-04-08 DIAGNOSIS — F1120 Opioid dependence, uncomplicated: Secondary | ICD-10-CM | POA: Diagnosis not present

## 2021-04-08 DIAGNOSIS — Z9181 History of falling: Secondary | ICD-10-CM | POA: Diagnosis not present

## 2021-04-08 DIAGNOSIS — C8590 Non-Hodgkin lymphoma, unspecified, unspecified site: Secondary | ICD-10-CM | POA: Diagnosis not present

## 2021-04-08 DIAGNOSIS — E119 Type 2 diabetes mellitus without complications: Secondary | ICD-10-CM | POA: Diagnosis not present

## 2021-04-08 DIAGNOSIS — F17210 Nicotine dependence, cigarettes, uncomplicated: Secondary | ICD-10-CM | POA: Diagnosis not present

## 2021-04-08 DIAGNOSIS — E559 Vitamin D deficiency, unspecified: Secondary | ICD-10-CM | POA: Diagnosis not present

## 2021-04-18 DIAGNOSIS — Z20822 Contact with and (suspected) exposure to covid-19: Secondary | ICD-10-CM | POA: Diagnosis not present

## 2021-04-18 DIAGNOSIS — R918 Other nonspecific abnormal finding of lung field: Secondary | ICD-10-CM | POA: Diagnosis not present

## 2021-04-18 DIAGNOSIS — C8331 Diffuse large B-cell lymphoma, lymph nodes of head, face, and neck: Secondary | ICD-10-CM | POA: Diagnosis not present

## 2021-04-18 DIAGNOSIS — R4182 Altered mental status, unspecified: Secondary | ICD-10-CM | POA: Diagnosis not present

## 2021-04-18 DIAGNOSIS — J449 Chronic obstructive pulmonary disease, unspecified: Secondary | ICD-10-CM | POA: Diagnosis not present

## 2021-04-18 DIAGNOSIS — Z9484 Stem cells transplant status: Secondary | ICD-10-CM | POA: Diagnosis not present

## 2021-04-18 DIAGNOSIS — E119 Type 2 diabetes mellitus without complications: Secondary | ICD-10-CM | POA: Diagnosis not present

## 2021-04-18 DIAGNOSIS — R051 Acute cough: Secondary | ICD-10-CM | POA: Diagnosis not present

## 2021-04-18 DIAGNOSIS — I10 Essential (primary) hypertension: Secondary | ICD-10-CM | POA: Diagnosis not present

## 2021-04-18 DIAGNOSIS — R079 Chest pain, unspecified: Secondary | ICD-10-CM | POA: Diagnosis not present

## 2021-04-18 DIAGNOSIS — Z5329 Procedure and treatment not carried out because of patient's decision for other reasons: Secondary | ICD-10-CM | POA: Diagnosis not present

## 2021-04-19 ENCOUNTER — Emergency Department: Payer: PPO

## 2021-04-19 ENCOUNTER — Encounter: Payer: Self-pay | Admitting: Emergency Medicine

## 2021-04-19 ENCOUNTER — Inpatient Hospital Stay
Admission: EM | Admit: 2021-04-19 | Discharge: 2021-04-24 | DRG: 193 | Disposition: A | Discharge status: Home / Self Care | Payer: PPO | Attending: Internal Medicine | Admitting: Internal Medicine

## 2021-04-19 DIAGNOSIS — J929 Pleural plaque without asbestos: Secondary | ICD-10-CM | POA: Diagnosis not present

## 2021-04-19 DIAGNOSIS — J188 Other pneumonia, unspecified organism: Secondary | ICD-10-CM

## 2021-04-19 DIAGNOSIS — R069 Unspecified abnormalities of breathing: Secondary | ICD-10-CM | POA: Diagnosis not present

## 2021-04-19 DIAGNOSIS — M1990 Unspecified osteoarthritis, unspecified site: Secondary | ICD-10-CM | POA: Diagnosis not present

## 2021-04-19 DIAGNOSIS — Z886 Allergy status to analgesic agent status: Secondary | ICD-10-CM

## 2021-04-19 DIAGNOSIS — J9601 Acute respiratory failure with hypoxia: Secondary | ICD-10-CM | POA: Diagnosis present

## 2021-04-19 DIAGNOSIS — E119 Type 2 diabetes mellitus without complications: Secondary | ICD-10-CM | POA: Diagnosis present

## 2021-04-19 DIAGNOSIS — Z8616 Personal history of COVID-19: Secondary | ICD-10-CM | POA: Diagnosis not present

## 2021-04-19 DIAGNOSIS — R062 Wheezing: Secondary | ICD-10-CM | POA: Diagnosis not present

## 2021-04-19 DIAGNOSIS — Z90710 Acquired absence of both cervix and uterus: Secondary | ICD-10-CM

## 2021-04-19 DIAGNOSIS — Z20822 Contact with and (suspected) exposure to covid-19: Secondary | ICD-10-CM | POA: Diagnosis present

## 2021-04-19 DIAGNOSIS — Z91040 Latex allergy status: Secondary | ICD-10-CM | POA: Diagnosis not present

## 2021-04-19 DIAGNOSIS — N390 Urinary tract infection, site not specified: Secondary | ICD-10-CM | POA: Diagnosis present

## 2021-04-19 DIAGNOSIS — F17200 Nicotine dependence, unspecified, uncomplicated: Secondary | ICD-10-CM | POA: Diagnosis not present

## 2021-04-19 DIAGNOSIS — F32A Depression, unspecified: Secondary | ICD-10-CM | POA: Diagnosis present

## 2021-04-19 DIAGNOSIS — J439 Emphysema, unspecified: Secondary | ICD-10-CM | POA: Diagnosis not present

## 2021-04-19 DIAGNOSIS — C8590 Non-Hodgkin lymphoma, unspecified, unspecified site: Secondary | ICD-10-CM | POA: Diagnosis not present

## 2021-04-19 DIAGNOSIS — F17210 Nicotine dependence, cigarettes, uncomplicated: Secondary | ICD-10-CM | POA: Diagnosis present

## 2021-04-19 DIAGNOSIS — J432 Centrilobular emphysema: Secondary | ICD-10-CM | POA: Diagnosis present

## 2021-04-19 DIAGNOSIS — I10 Essential (primary) hypertension: Secondary | ICD-10-CM | POA: Diagnosis present

## 2021-04-19 DIAGNOSIS — Z91048 Other nonmedicinal substance allergy status: Secondary | ICD-10-CM

## 2021-04-19 DIAGNOSIS — J189 Pneumonia, unspecified organism: Secondary | ICD-10-CM

## 2021-04-19 DIAGNOSIS — A419 Sepsis, unspecified organism: Secondary | ICD-10-CM | POA: Diagnosis present

## 2021-04-19 DIAGNOSIS — Z79899 Other long term (current) drug therapy: Secondary | ICD-10-CM | POA: Diagnosis not present

## 2021-04-19 DIAGNOSIS — Z833 Family history of diabetes mellitus: Secondary | ICD-10-CM

## 2021-04-19 DIAGNOSIS — K219 Gastro-esophageal reflux disease without esophagitis: Secondary | ICD-10-CM | POA: Diagnosis not present

## 2021-04-19 DIAGNOSIS — I959 Hypotension, unspecified: Secondary | ICD-10-CM | POA: Diagnosis not present

## 2021-04-19 DIAGNOSIS — R0602 Shortness of breath: Secondary | ICD-10-CM | POA: Diagnosis not present

## 2021-04-19 DIAGNOSIS — R059 Cough, unspecified: Secondary | ICD-10-CM | POA: Diagnosis not present

## 2021-04-19 DIAGNOSIS — F419 Anxiety disorder, unspecified: Secondary | ICD-10-CM | POA: Diagnosis not present

## 2021-04-19 DIAGNOSIS — Z9484 Stem cells transplant status: Secondary | ICD-10-CM | POA: Diagnosis not present

## 2021-04-19 DIAGNOSIS — J123 Human metapneumovirus pneumonia: Principal | ICD-10-CM | POA: Diagnosis present

## 2021-04-19 DIAGNOSIS — R0689 Other abnormalities of breathing: Secondary | ICD-10-CM | POA: Diagnosis not present

## 2021-04-19 HISTORY — DX: Stem cells transplant status: Z94.84

## 2021-04-19 HISTORY — DX: Other pneumonia, unspecified organism: J18.8

## 2021-04-19 HISTORY — DX: Pneumonia, unspecified organism: J18.9

## 2021-04-19 LAB — URINALYSIS, COMPLETE (UACMP) WITH MICROSCOPIC
Bilirubin Urine: NEGATIVE
Glucose, UA: >=500 mg/dL — AB
Hgb urine dipstick: NEGATIVE
Ketones, ur: NEGATIVE mg/dL
Leukocytes,Ua: NEGATIVE
Nitrite: NEGATIVE
Protein, ur: 30 mg/dL — AB
Specific Gravity, Urine: 1.024 (ref 1.005–1.030)
pH: 5 (ref 5.0–8.0)

## 2021-04-19 LAB — CBC WITH DIFFERENTIAL/PLATELET
Abs Immature Granulocytes: 0.04 10*3/uL (ref 0.00–0.07)
Basophils Absolute: 0 10*3/uL (ref 0.0–0.1)
Basophils Relative: 0 %
Eosinophils Absolute: 0 10*3/uL (ref 0.0–0.5)
Eosinophils Relative: 0 %
HCT: 39.7 % (ref 36.0–46.0)
Hemoglobin: 12.4 g/dL (ref 12.0–15.0)
Immature Granulocytes: 1 %
Lymphocytes Relative: 22 %
Lymphs Abs: 1.9 10*3/uL (ref 0.7–4.0)
MCH: 28.4 pg (ref 26.0–34.0)
MCHC: 31.2 g/dL (ref 30.0–36.0)
MCV: 90.8 fL (ref 80.0–100.0)
Monocytes Absolute: 0.7 10*3/uL (ref 0.1–1.0)
Monocytes Relative: 8 %
Neutro Abs: 5.9 10*3/uL (ref 1.7–7.7)
Neutrophils Relative %: 69 %
Platelets: 157 10*3/uL (ref 150–400)
RBC: 4.37 MIL/uL (ref 3.87–5.11)
RDW: 16 % — ABNORMAL HIGH (ref 11.5–15.5)
WBC: 8.6 10*3/uL (ref 4.0–10.5)
nRBC: 0 % (ref 0.0–0.2)

## 2021-04-19 LAB — COMPREHENSIVE METABOLIC PANEL
ALT: 17 U/L (ref 0–44)
AST: 41 U/L (ref 15–41)
Albumin: 3.9 g/dL (ref 3.5–5.0)
Alkaline Phosphatase: 74 U/L (ref 38–126)
Anion gap: 14 (ref 5–15)
BUN: 16 mg/dL (ref 8–23)
CO2: 20 mmol/L — ABNORMAL LOW (ref 22–32)
Calcium: 8.6 mg/dL — ABNORMAL LOW (ref 8.9–10.3)
Chloride: 101 mmol/L (ref 98–111)
Creatinine, Ser: 0.91 mg/dL (ref 0.44–1.00)
GFR, Estimated: >60 mL/min (ref >60)
Glucose, Bld: 156 mg/dL — ABNORMAL HIGH (ref 70–99)
Potassium: 4.3 mmol/L (ref 3.5–5.1)
Sodium: 135 mmol/L (ref 135–145)
Total Bilirubin: 1.1 mg/dL (ref 0.3–1.2)
Total Protein: 7.6 g/dL (ref 6.5–8.1)

## 2021-04-19 LAB — CBC
HCT: 32.3 % — ABNORMAL LOW (ref 36.0–46.0)
Hemoglobin: 10.2 g/dL — ABNORMAL LOW (ref 12.0–15.0)
MCH: 28.8 pg (ref 26.0–34.0)
MCHC: 31.6 g/dL (ref 30.0–36.0)
MCV: 91.2 fL (ref 80.0–100.0)
Platelets: 133 10*3/uL — ABNORMAL LOW (ref 150–400)
RBC: 3.54 MIL/uL — ABNORMAL LOW (ref 3.87–5.11)
RDW: 15.7 % — ABNORMAL HIGH (ref 11.5–15.5)
WBC: 5 10*3/uL (ref 4.0–10.5)
nRBC: 0 % (ref 0.0–0.2)

## 2021-04-19 LAB — CREATININE, SERUM
Creatinine, Ser: 0.93 mg/dL (ref 0.44–1.00)
GFR, Estimated: >60 mL/min (ref >60)

## 2021-04-19 LAB — RESP PANEL BY RT-PCR (FLU A&B, COVID) ARPGX2
Influenza A by PCR: NEGATIVE
Influenza B by PCR: NEGATIVE
SARS Coronavirus 2 by RT PCR: NEGATIVE

## 2021-04-19 LAB — PROTIME-INR
INR: 1.1 (ref 0.8–1.2)
Prothrombin Time: 14.4 seconds (ref 11.4–15.2)

## 2021-04-19 LAB — LACTIC ACID, PLASMA: Lactic Acid, Venous: 1.9 mmol/L (ref 0.5–1.9)

## 2021-04-19 LAB — APTT: aPTT: 29 seconds (ref 24–36)

## 2021-04-19 IMAGING — DX DG CHEST 1V PORT
1 series · 1 of 1 positions shown · non-contrast
Comparison: [DATE] and chest CT [DATE]

CLINICAL DATA: Possible sepsis.  Shortness of breath and wheezing.

EXAM:
PORTABLE CHEST 1 VIEW

[chest ap]
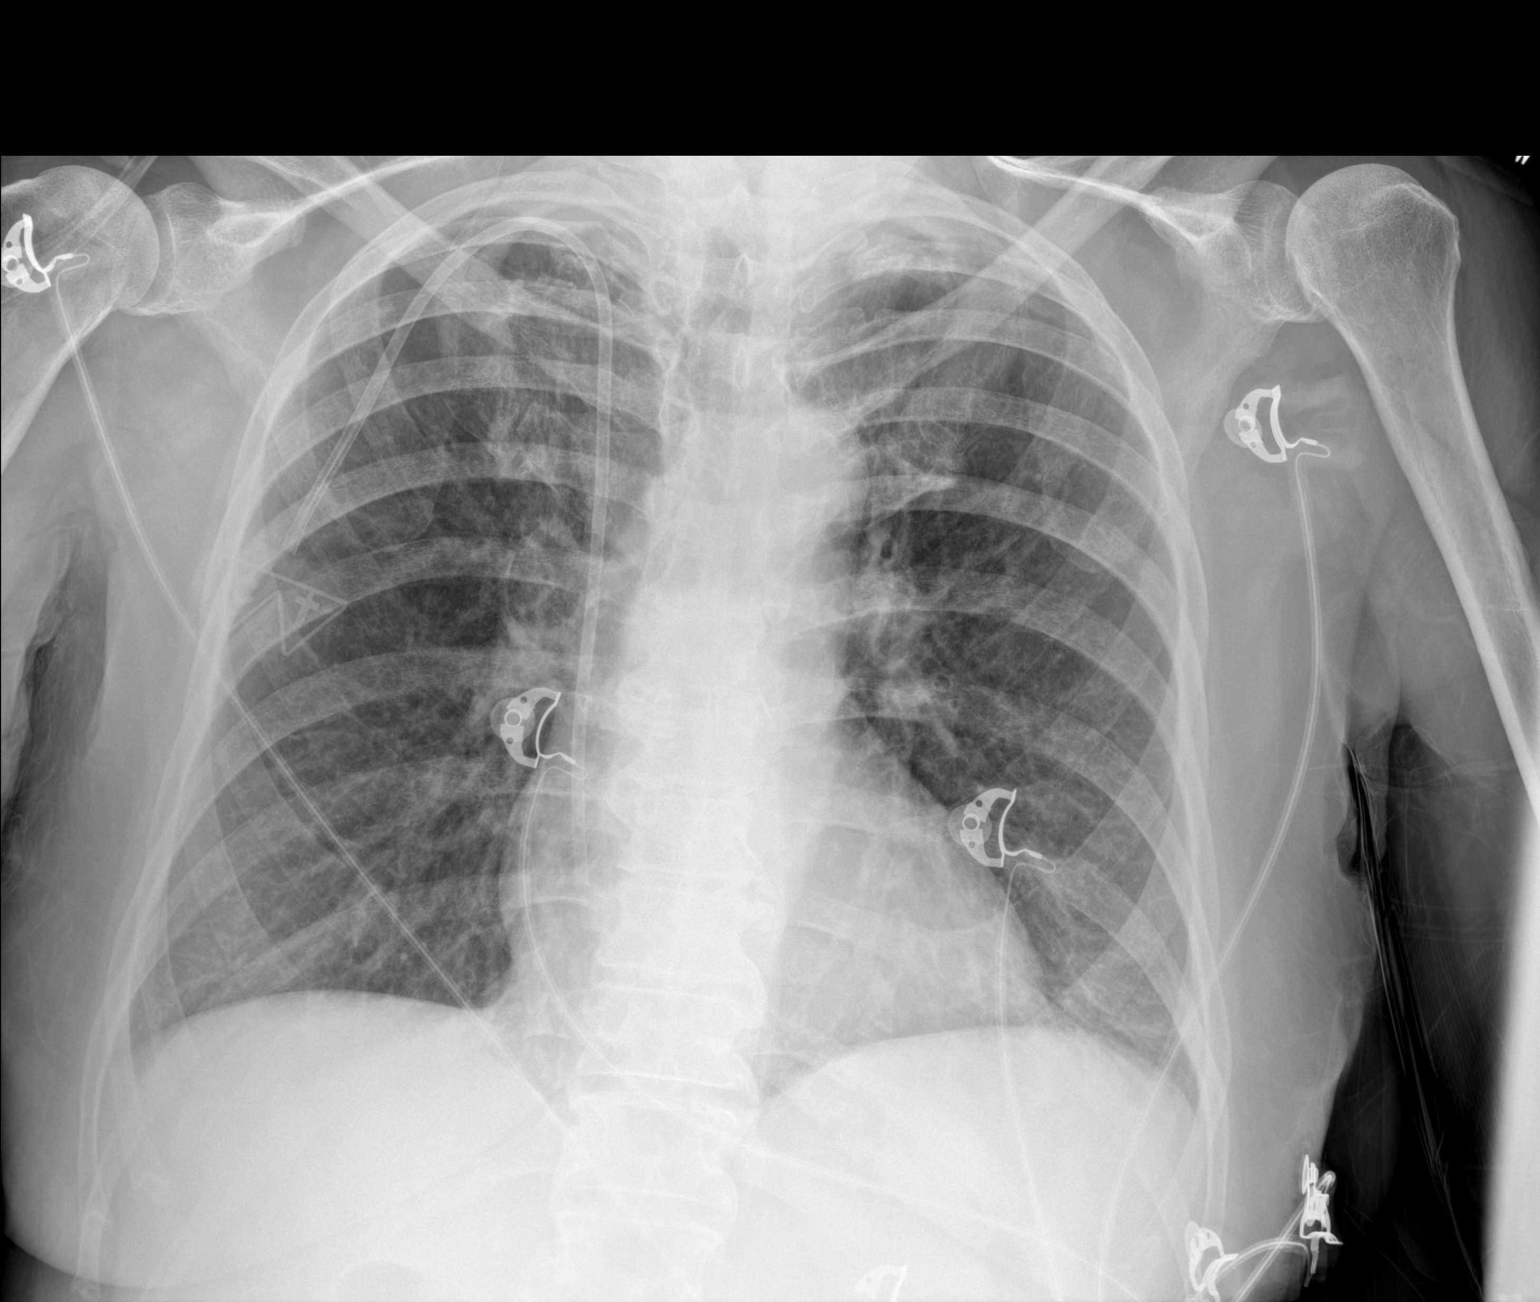

[1 of 1 positions shown; findings below may reference images not displayed]

FINDINGS: Right IJ Port-A-Cath unchanged. Lungs are adequately inflated
without airspace consolidation or effusion. Biapical pleural
thickening with calcification unchanged. Cardiomediastinal
silhouette and remainder of the exam is unchanged.
IMPRESSION: No acute cardiopulmonary disease.

## 2021-04-19 IMAGING — CT CT ANGIO CHEST
2 of 7 series · 18 of 46 positions shown · IV contrast (APPLIED)
Comparison: [DATE]

CLINICAL DATA: Cough, wheezing

EXAM:
CT ANGIOGRAPHY CHEST WITH CONTRAST
TECHNIQUE: Multidetector CT imaging of the chest was performed using the
standard protocol during bolus administration of intravenous
contrast. Multiplanar CT image reconstructions and MIPs were
obtained to evaluate the vascular anatomy.

[Series 5: thins · axial · 0.68mm/px · z∈[-850,-587]mm · 15 of 367 slices shown]
[im 19/367  lung]
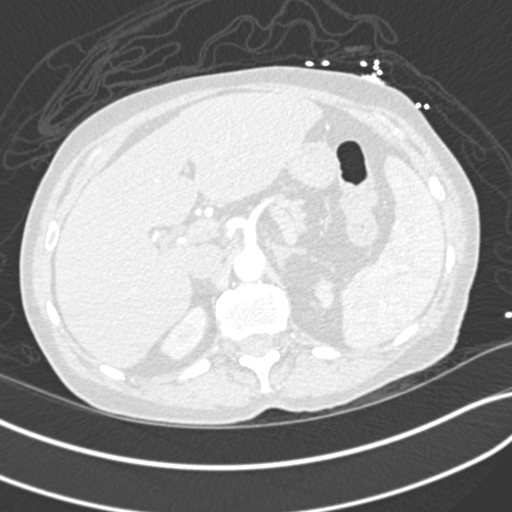
[im 37/367  soft-tissue]
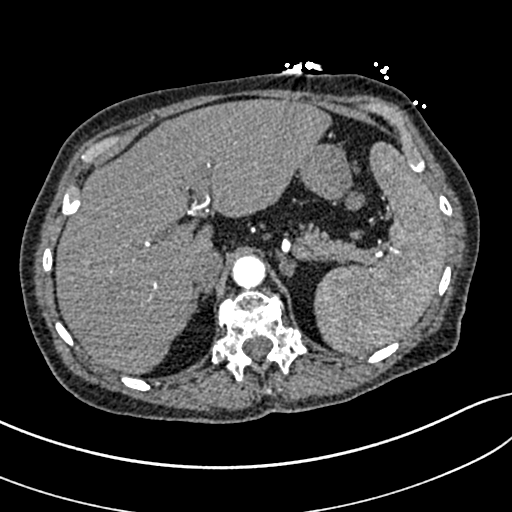
[im 74/367  lung]
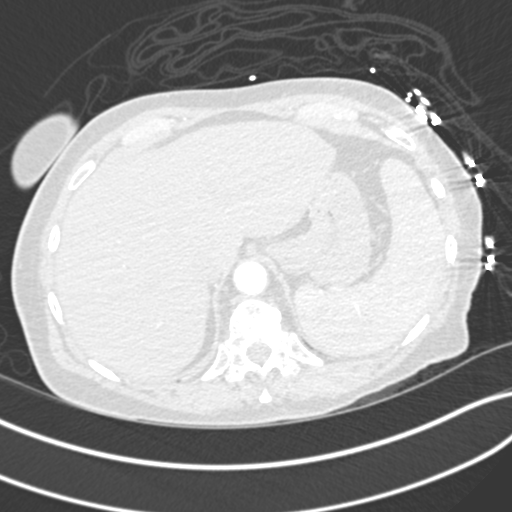
[im 92/367  soft-tissue]
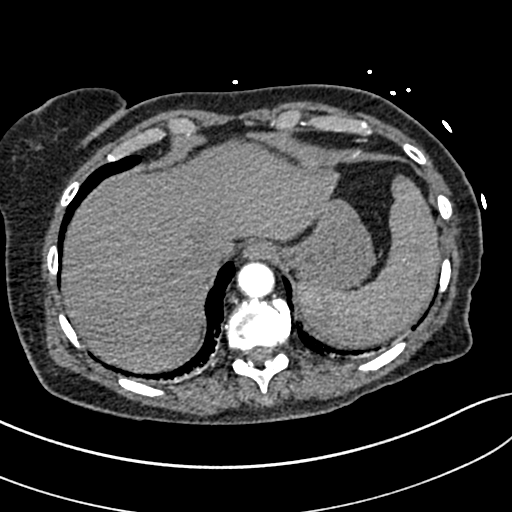
[im 110/367  lung]
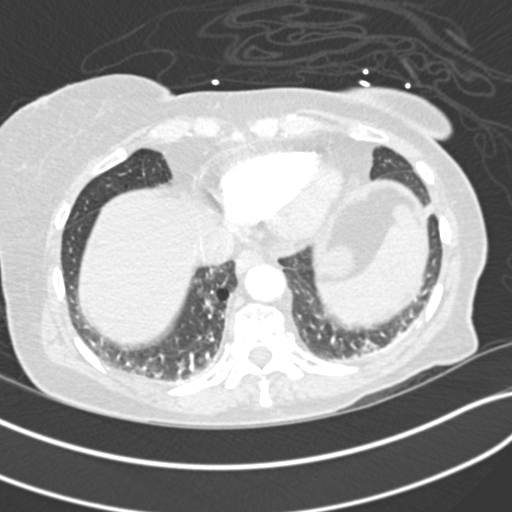
[im 129/367  soft-tissue]
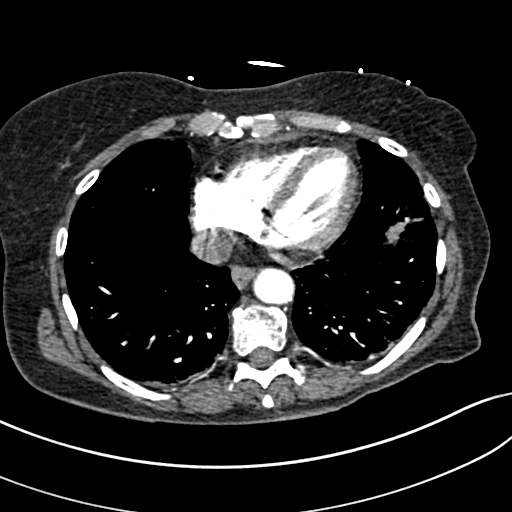
[im 165/367  lung]
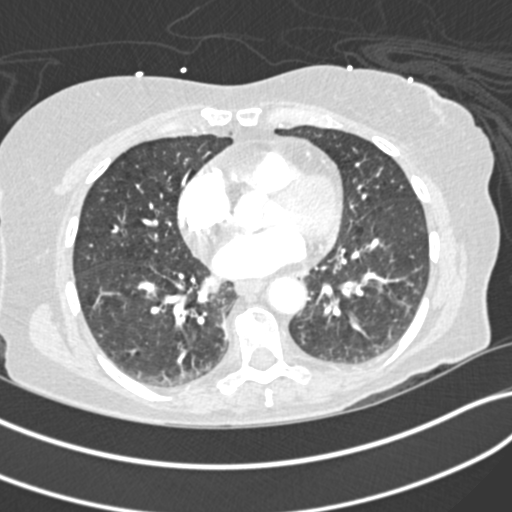
[im 184/367  soft-tissue]
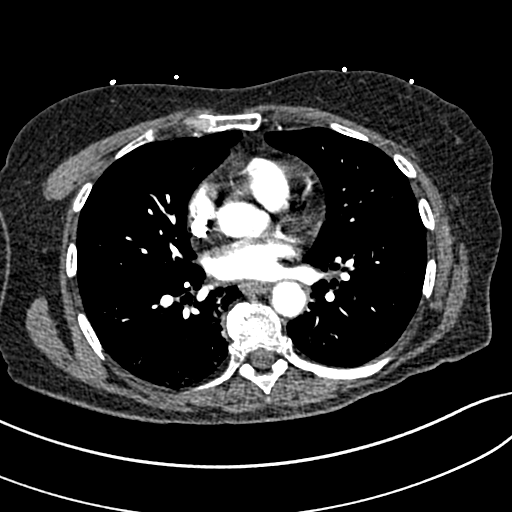
[im 202/367  lung]
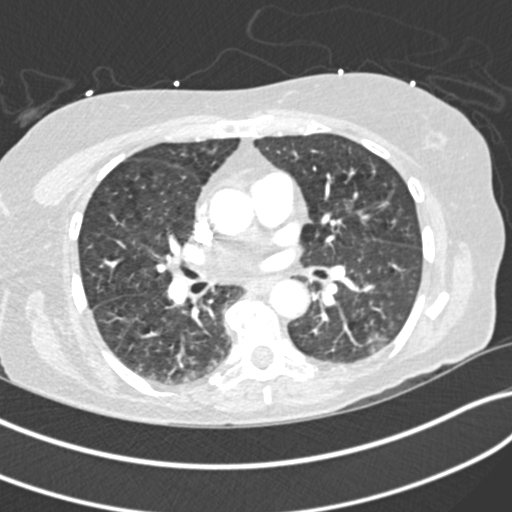
[im 238/367  soft-tissue]
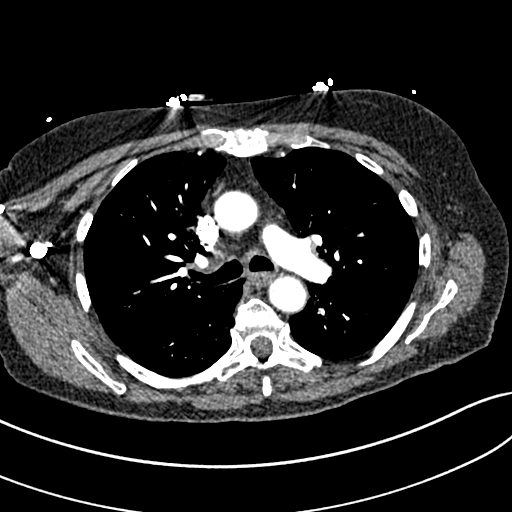
[im 257/367  lung]
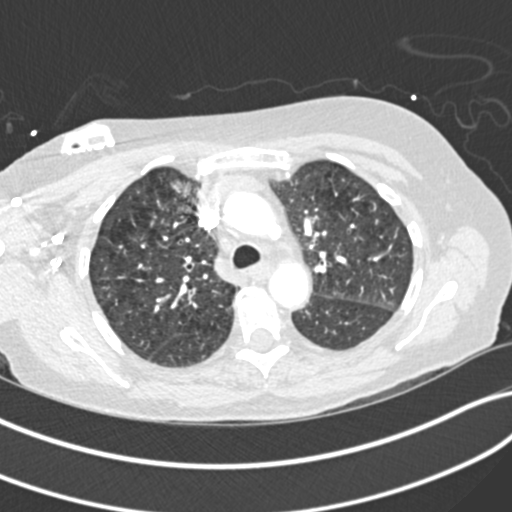
[im 275/367  soft-tissue]
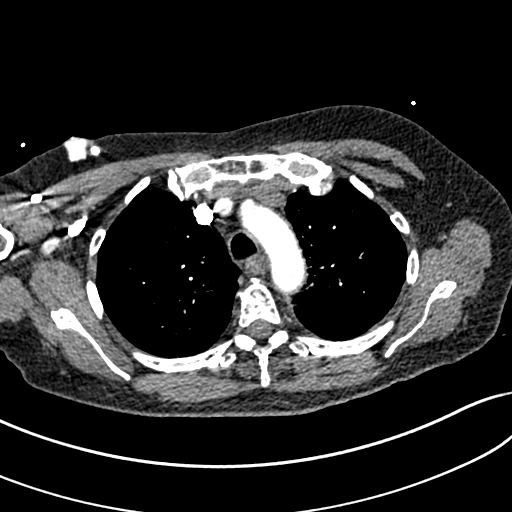
[im 293/367  lung]
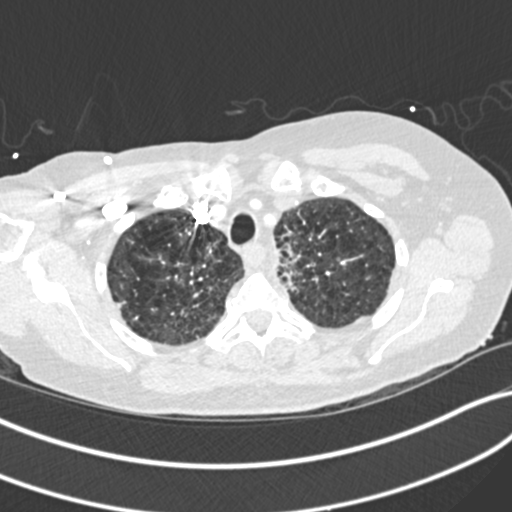
[im 330/367  soft-tissue]
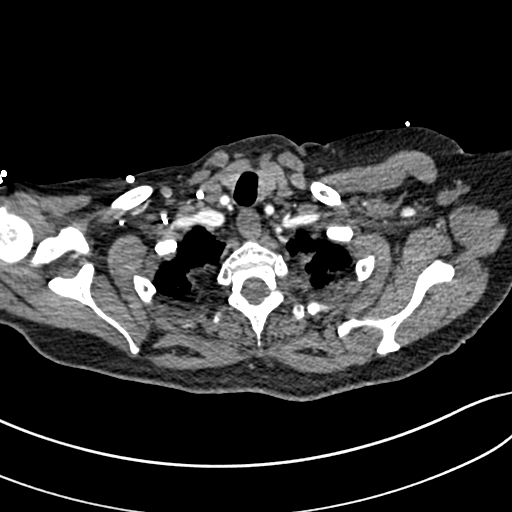
[im 348/367  lung]
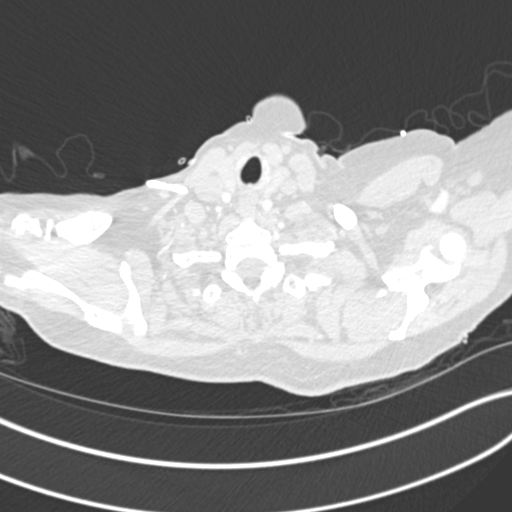

[Series 7: coronal mpr · coronal · 0.59mm/px · 3 of 82 slices shown]
[im 21/82  soft-tissue]
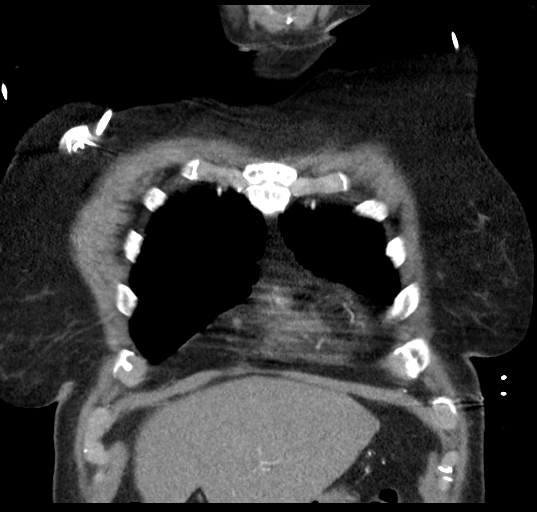
[im 41/82  soft-tissue]
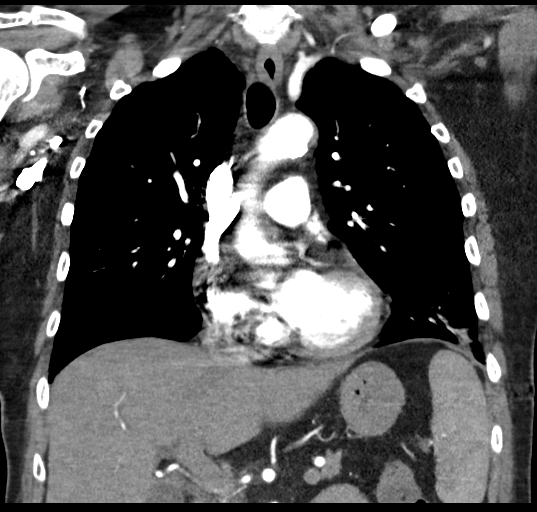
[im 61/82  soft-tissue]
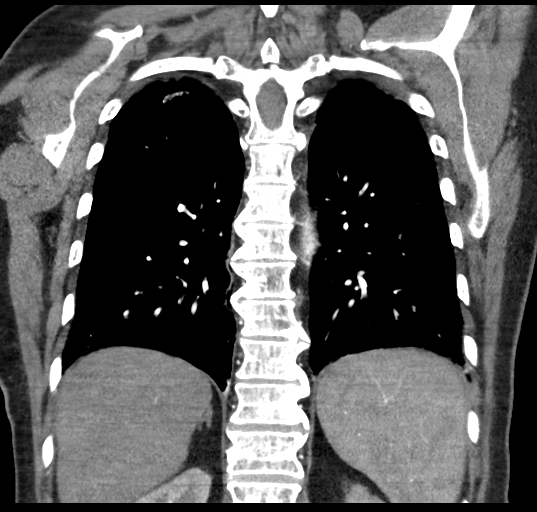

[18 of 46 positions shown; findings below may reference images not displayed]

RADIATION DOSE REDUCTION: This exam was performed according to the
departmental dose-optimization program which includes automated
exposure control, adjustment of the mA and/or kV according to
patient size and/or use of iterative reconstruction technique.

CONTRAST:  75mL OMNIPAQUE IOHEXOL 350 MG/ML SOLN
FINDINGS: Cardiovascular: There is homogeneous enhancement in thoracic aorta.
There are scattered atherosclerotic plaques and calcifications in
thoracic aorta. There are no intraluminal filling defects in the
pulmonary artery branches.

Mediastinum/Nodes: No significant lymphadenopathy seen.

Lungs/Pleura: Centrilobular emphysema is seen. Linear patchy
densities in both apices have not changed. There is new focal
interstitial infiltrate in the anterior segment of right upper lobe.
There are small foci of ground-glass densities in right lower lobe.
There are patchy interstitial and ground-glass infiltrates in the
left lower lobe. There is interval decrease in patchy infiltrates in
the anterior aspect of left lower lobe. There is no pleural effusion
or pneumothorax.

Upper Abdomen: There is fatty infiltration in the liver. Spleen is
enlarged measuring 14.6 cm in AP diameter.

Musculoskeletal: Unremarkable.

Review of the MIP images confirms the above findings.
IMPRESSION: There is no evidence thoracic aortic dissection. There is no
evidence of pulmonary artery embolism.

There is interval increase in interstitial and ground-glass
densities in the right upper lobe and both lower lobes suggesting
possible multifocal pneumonia. There is interval decrease in patchy
alveolar infiltrates in the anterior left lower lobe. There is no
significant pleural effusion.

Fatty liver.  Enlarged spleen.

## 2021-04-19 MED ORDER — SODIUM CHLORIDE 0.9 % IV SOLN
500.0000 mg | INTRAVENOUS | Status: AC
  Administered 2021-04-19 – 2021-04-23 (×5): 500 mg via INTRAVENOUS
  Filled 2021-04-19 (×2): qty 500
  Filled 2021-04-19 (×2): qty 5
  Filled 2021-04-19: qty 500

## 2021-04-19 MED ORDER — IPRATROPIUM-ALBUTEROL 0.5-2.5 (3) MG/3ML IN SOLN
3.0000 mL | RESPIRATORY_TRACT | Status: DC | PRN
  Administered 2021-04-20 (×3): 3 mL via RESPIRATORY_TRACT
  Filled 2021-04-19 (×3): qty 3

## 2021-04-19 MED ORDER — ONDANSETRON HCL 4 MG PO TABS
4.0000 mg | ORAL_TABLET | Freq: Three times a day (TID) | ORAL | Status: DC | PRN
  Administered 2021-04-20: 4 mg via ORAL
  Filled 2021-04-19: qty 1

## 2021-04-19 MED ORDER — MAGNESIUM OXIDE -MG SUPPLEMENT 400 (240 MG) MG PO TABS
400.0000 mg | ORAL_TABLET | Freq: Every day | ORAL | Status: DC
  Administered 2021-04-20 – 2021-04-24 (×5): 400 mg via ORAL
  Filled 2021-04-19 (×5): qty 1

## 2021-04-19 MED ORDER — SERTRALINE HCL 50 MG PO TABS
150.0000 mg | ORAL_TABLET | Freq: Every day | ORAL | Status: DC
  Administered 2021-04-19 – 2021-04-24 (×6): 150 mg via ORAL
  Filled 2021-04-19 (×6): qty 3

## 2021-04-19 MED ORDER — PANTOPRAZOLE SODIUM 40 MG PO TBEC
40.0000 mg | DELAYED_RELEASE_TABLET | Freq: Every day | ORAL | Status: DC
  Administered 2021-04-20 – 2021-04-24 (×5): 40 mg via ORAL
  Filled 2021-04-19 (×5): qty 1

## 2021-04-19 MED ORDER — LACTATED RINGERS IV SOLN: INTRAVENOUS | Status: AC

## 2021-04-19 MED ORDER — ADULT MULTIVITAMIN W/MINERALS CH
1.0000 | ORAL_TABLET | Freq: Every day | ORAL | Status: DC
  Administered 2021-04-19 – 2021-04-24 (×6): 1 via ORAL
  Filled 2021-04-19 (×6): qty 1

## 2021-04-19 MED ORDER — VANCOMYCIN HCL IN DEXTROSE 1-5 GM/200ML-% IV SOLN
1000.0000 mg | Freq: Once | INTRAVENOUS | Status: AC
  Administered 2021-04-19: 1000 mg via INTRAVENOUS
  Filled 2021-04-19: qty 200

## 2021-04-19 MED ORDER — SODIUM CHLORIDE 0.9 % IV SOLN
2.0000 g | INTRAVENOUS | Status: DC
  Administered 2021-04-19: 2 g via INTRAVENOUS
  Filled 2021-04-19: qty 20
  Filled 2021-04-19: qty 2

## 2021-04-19 MED ORDER — SODIUM CHLORIDE 0.9 % IV BOLUS (SEPSIS)
1000.0000 mL | Freq: Once | INTRAVENOUS | Status: AC
  Administered 2021-04-19: 1000 mL via INTRAVENOUS

## 2021-04-19 MED ORDER — SODIUM CHLORIDE 0.9 % IV SOLN
2.0000 g | Freq: Once | INTRAVENOUS | Status: AC
  Administered 2021-04-19: 2 g via INTRAVENOUS
  Filled 2021-04-19: qty 12.5

## 2021-04-19 MED ORDER — VITAMIN D3 25 MCG (1000 UNIT) PO TABS
1000.0000 [IU] | ORAL_TABLET | Freq: Every day | ORAL | Status: DC
  Administered 2021-04-20 – 2021-04-24 (×5): 1000 [IU] via ORAL
  Filled 2021-04-19 (×10): qty 1

## 2021-04-19 MED ORDER — NICOTINE 21 MG/24HR TD PT24
21.0000 mg | MEDICATED_PATCH | Freq: Every day | TRANSDERMAL | Status: DC
  Administered 2021-04-19 – 2021-04-24 (×6): 21 mg via TRANSDERMAL
  Filled 2021-04-19 (×7): qty 1

## 2021-04-19 MED ORDER — DICLOFENAC SODIUM 1 % EX GEL
4.0000 g | Freq: Four times a day (QID) | CUTANEOUS | Status: DC
  Administered 2021-04-20 – 2021-04-22 (×8): 4 g via TOPICAL
  Filled 2021-04-19: qty 100

## 2021-04-19 MED ORDER — IOHEXOL 350 MG/ML SOLN
75.0000 mL | Freq: Once | INTRAVENOUS | Status: AC | PRN
  Administered 2021-04-19: 75 mL via INTRAVENOUS

## 2021-04-19 MED ORDER — METRONIDAZOLE 500 MG/100ML IV SOLN
500.0000 mg | Freq: Once | INTRAVENOUS | Status: AC
  Administered 2021-04-19: 500 mg via INTRAVENOUS
  Filled 2021-04-19: qty 100

## 2021-04-19 MED ORDER — POTASSIUM CHLORIDE CRYS ER 20 MEQ PO TBCR
20.0000 meq | EXTENDED_RELEASE_TABLET | Freq: Every day | ORAL | Status: DC
  Administered 2021-04-20 – 2021-04-24 (×5): 20 meq via ORAL
  Filled 2021-04-19 (×5): qty 1

## 2021-04-19 MED ORDER — VITAMIN B-12 1000 MCG PO TABS
1000.0000 ug | ORAL_TABLET | Freq: Every day | ORAL | Status: DC
  Administered 2021-04-20 – 2021-04-24 (×5): 1000 ug via ORAL
  Filled 2021-04-19 (×5): qty 1

## 2021-04-19 MED ORDER — ENOXAPARIN SODIUM 40 MG/0.4ML IJ SOSY
40.0000 mg | PREFILLED_SYRINGE | INTRAMUSCULAR | Status: DC
  Administered 2021-04-19 – 2021-04-23 (×5): 40 mg via SUBCUTANEOUS
  Filled 2021-04-19 (×5): qty 0.4

## 2021-04-19 NOTE — ED Notes (Signed)
Report called to the floor and spoke to [place], Therapist, sports after this nurse updated the vital signs and hung the patient's antibiotics that were ordered for 2000. All questions were answered. ?

## 2021-04-19 NOTE — Assessment & Plan Note (Signed)
-   Continue home PPI °

## 2021-04-19 NOTE — H&P (Signed)
? ? ?HISTORY AND PHYSICAL ? ?Patient: Beth Stanley 71 y.o. female ?MRN: [id number] ? ?Today is hospital day 0 after presenting to ED on 04/19/2021  1:51 PM with  ?Chief Complaint  ?Patient presents with  ? Shortness of Breath  ? ? ? ?RECORD REVIEW AND HOSPITAL COURSE: ?Ms. Kapuscinski is a 71 year old female with past medical history diabetes, hypertension, anxiety, non-Hodgkin's lymphoma.  Presents to the ED from home via EMS with chief complaint shortness of breath x2 to 3 days, coughing/wheezing.  Seen at [facility] ED yesterday and given some breathing treatments, discharged home.  Feeling worse today and found to be febrile 101.6 Fahrenheit by EMS, 85% room air, patient is a smoker but no home oxygen, in ED patient found to be tachycardic, tachypneic.  Lactic acid 1.9, WBC normal at 8.6, negative chest x-ray.  Received normal saline 1 L bolus, cefepime, metronidazole, vancomycin.  CT a chest was obtained to evaluate for pulmonary embolism, which showed multifocal pneumonia and right upper lobe, left lower lobe, right lower lobe.  No evidence of PE ?Subjective: Patient seen and examined resting comfortably in bed in emergency department, eating dinner.  Breathing comfortably on O2 via 2 L nasal cannula.  Husband present at bedside, patient confirms above history and states overall she is feeling a bit better since arriving in the emergency department but still noting significant cough and needing to stay on oxygen for her shortness of breath. ? ? ? ? ?ASSESSMENT & PLAN ? ?Multifocal pneumonia ?Community-acquired, sepsis present on admission ?Patient reports improvement following treatment in ED but still having SOB/cough and requiring O2 ?--> Continue antibiotics with azithromycin and ceftriaxone (patient also received vancomycin, cefepime, Flagyl in the ED prior to CT revealing multifocal pneumonia)  ?--> Supplemental oxygen ?--> DuoNebs as needed ? ? ?Lymphoma ([place]) ?--> Monitor CBC ? ?Hypertension ?--> Soft BP,  holding home antihypertensives ? ?Smoker ?--> Nicotine replacement ? ?Sepsis ([place]) ?See above re: pneumonia ? ?GERD (gastroesophageal reflux disease) ?--> Continue home PPI ? ?Depression ?--> Continue home SSRI ? ? ?VTE Ppx: Lovenox ?CODE STATUS: FULL ?Admitted from: home ?Expected Dispo: home ?Barriers to discharge: Need for IV antibiotics and supplemental oxygen, continued medical management ?Family communication: husband at bedside in emergency department ? ? ? ? ? ? ? ? ? ? ? ? ? ?Past Medical History:  ?Diagnosis Date  ? Anxiety   ? Arthritis   ? C. difficile colitis 10/01/2019  ? COVID-19 virus infection 09/29/2019  ? Diabetes ([place])   ? Heartburn   ? History of stem cell transplant ([place])   ? HTN (hypertension)   ? Non Hodgkin's lymphoma ([place]) 2004  ? Hx chemo tx's.   ? ? ?Past Surgical History:  ?Procedure Laterality Date  ? TOTAL ABDOMINAL HYSTERECTOMY    ? ? ?Family History  ?Problem Relation Age of Onset  ? Diabetes Mother   ? Lung cancer Sister   ? Lung cancer Brother   ? Kidney cancer Brother   ?     removed kidney   ? Lung cancer Brother   ? Prostate cancer Neg Hx   ? Bladder Cancer Neg Hx   ? ?Social History:  reports that she has been smoking cigarettes. She has been smoking an average of 1 pack per day. She has never used smokeless tobacco. She reports that she does not currently use alcohol. She reports that she does not use drugs. ? ?Allergies:  ?Allergies  ?Allergen Reactions  ? Aspirin   ?  Stomach cramp  ? Latex Swelling  ? Other   ? Tape Rash  ?  Makes Whelps on Skin ?Makes Whelps on Skin  ? ? ?No current facility-administered medications on file prior to encounter.  ? ?Current Outpatient Medications on File Prior to Encounter  ?Medication Sig Dispense Refill  ? acetaminophen (TYLENOL) 500 MG tablet Take 1,000 mg by mouth every 6 (six) hours as needed.    ? albuterol (VENTOLIN HFA) 108 (90 Base) MCG/ACT inhaler Inhale 1 puff into the lungs every 6 (six) hours as needed for wheezing.    ?  Buprenorphine HCl-Naloxone HCl 8-2 MG FILM Place 1 strip under the tongue 3 (three) times daily.    ? carboxymethylcellulose (REFRESH PLUS) 0.5 % SOLN Place 1 drop into the left eye 2 (two) times daily.    ? Cholecalciferol 25 MCG (1000 UT) tablet Take 1 tablet by mouth daily.    ? clindamycin (CLINDAGEL) 1 % gel Apply 1 application topically daily as needed.    ? cyanocobalamin 1000 MCG tablet Take 1 tablet by mouth daily.    ? dexamethasone (DECADRON) 2 MG tablet Take 1 tablet ('2mg'$ ) by mouth twice daily for 4 days then take 1 tablet ('2mg'$ ) by mouth daily for 4 days    ? diclofenac Sodium (VOLTAREN) 1 % GEL Apply 4 g topically 4 (four) times daily.    ? fluticasone-salmeterol (ADVAIR) 250-50 MCG/ACT AEPB Inhale 1 puff into the lungs 2 (two) times daily as needed.    ? JARDIANCE 25 MG TABS tablet Take 25 mg by mouth daily.    ? Lancets (FREESTYLE) lancets TEST BLOOD GLUCOSE ONCE DAILY 100 each 12  ? lidocaine-prilocaine (EMLA) cream Place a dime sized amount over port area approximately 30 minutes prior to access.    ? magic mouthwash SOLN Take 15 mLs by mouth 4 (four) times daily as needed for mouth pain.    ? magnesium oxide (MAG-OX) 400 (240 Mg) MG tablet Take 1 tablet by mouth daily.    ? Multiple Vitamin (MULTIVITAMIN) tablet Take 1 tablet by mouth daily.    ? omeprazole (PRILOSEC) 20 MG capsule Take 20 mg by mouth daily.    ? ondansetron (ZOFRAN) 4 MG tablet Take 4 mg by mouth every 8 (eight) hours as needed for nausea or vomiting.    ? potassium chloride SA (KLOR-CON) 20 MEQ tablet Take 20 mEq by mouth daily.    ? prochlorperazine (COMPAZINE) 10 MG tablet Take 10 mg by mouth every 6 (six) hours.    ? ramipril (ALTACE) 2.5 MG capsule Take 1 capsule (2.5 mg total) by mouth daily. 90 capsule 3  ? sertraline (ZOLOFT) 100 MG tablet TAKE ONE (1) TABLET EACH DAY (Patient taking differently: Take 150 mg by mouth daily.) 90 tablet 3  ? valACYclovir (VALTREX) 1000 MG tablet Take 1,000 mg by mouth 2 (two) times daily.     ? White Petrolatum-Mineral Oil ([place] PETROL-MINERAL OIL-LANOLIN) 0.1-0.1 % OINT Place 1 application into the left eye at bedtime.    ? ? ? ?Results for orders placed or performed during the hospital encounter of 04/19/21 (from the past 48 hour(s))  ?Lactic acid, plasma     Status: None  ? Collection Time: 04/19/21  2:02 PM  ?Result Value Ref Range  ? Lactic Acid, Venous 1.9 0.5 - 1.9 mmol/L  ?  Comment: Performed at [facility], [address] [place], [place] [address]  ?Comprehensive metabolic panel     Status: Abnormal  ? Collection  Time: 04/19/21  2:02 PM  ?Result Value Ref Range  ? Sodium 135 135 - 145 mmol/L  ? Potassium 4.3 3.5 - 5.1 mmol/L  ?  Comment: HEMOLYSIS AT THIS LEVEL MAY AFFECT RESULT  ? Chloride 101 98 - 111 mmol/L  ? CO2 20 (L) 22 - 32 mmol/L  ? Glucose, Bld 156 (H) 70 - 99 mg/dL  ?  Comment: Glucose reference range applies only to samples taken after fasting for at least 8 hours.  ? BUN 16 8 - 23 mg/dL  ? Creatinine, Ser 0.91 0.44 - 1.00 mg/dL  ? Calcium 8.6 (L) 8.9 - 10.3 mg/dL  ? Total Protein 7.6 6.5 - 8.1 g/dL  ? Albumin 3.9 3.5 - 5.0 g/dL  ? AST 41 15 - 41 U/L  ?  Comment: HEMOLYSIS AT THIS LEVEL MAY AFFECT RESULT  ? ALT 17 0 - 44 U/L  ?  Comment: HEMOLYSIS AT THIS LEVEL MAY AFFECT RESULT  ? Alkaline Phosphatase 74 38 - 126 U/L  ? Total Bilirubin 1.1 0.3 - 1.2 mg/dL  ?  Comment: HEMOLYSIS AT THIS LEVEL MAY AFFECT RESULT  ? GFR, Estimated >60 >60 mL/min  ?  Comment: (NOTE) ?Calculated using the CKD-EPI Creatinine Equation (2021) ?  ? Anion gap 14 5 - 15  ?  Comment: Performed at [facility], [address], [place], [place] [address]  ?CBC WITH DIFFERENTIAL     Status: Abnormal  ? Collection Time: 04/19/21  2:02 PM  ?Result Value Ref Range  ? WBC 8.6 4.0 - 10.5 K/uL  ? RBC 4.37 3.87 - 5.11 MIL/uL  ? Hemoglobin 12.4 12.0 - 15.0 g/dL  ? HCT 39.7 36.0 - 46.0 %  ? MCV 90.8 80.0 - 100.0 fL  ? MCH 28.4 26.0 - 34.0 pg  ? MCHC 31.2 30.0 - 36.0 g/dL  ? RDW 16.0 (H) 11.5 - 15.5 %   ? Platelets 157 150 - 400 K/uL  ? nRBC 0.0 0.0 - 0.2 %  ? Neutrophils Relative % 69 %  ? Neutro Abs 5.9 1.7 - 7.7 K/uL  ? Lymphocytes Relative 22 %  ? Lymphs Abs 1.9 0.7 - 4.0 K/uL  ? Monocytes Relative 8 %  ?

## 2021-04-19 NOTE — Assessment & Plan Note (Addendum)
Patient admitted for concern of multifocal pneumonia.  Procalcitonin negative.  COVID-19 and influenza PCR negative.  MRSA swab negative. ?Most likely viral cause. ?-Check respiratory viral panel ?-Stop ceftriaxone ?-Continue with Zithromax ?-Adding Solu-Medrol for significant wheezing. ?-Continue with supplemental oxygen-wean as tolerated ?-Continue with supportive care ? ?

## 2021-04-19 NOTE — ED Provider Notes (Signed)
----------------------------------------- ?  7:18 PM on 04/19/2021 ?----------------------------------------- ? ?I took over care of this patient from Dr. Orpah Clinton.  CT shows no evidence of PE but likely multifocal pneumonia.  The patient is already received broad-spectrum antibiotics.  Her vital signs are stable.  I consulted Dr. Sheppard Coil from the hospitalist service for admission; based on her discussion she agrees to admit the patient. ?  Arta Silence, MD ?04/19/21 1918 ? ?

## 2021-04-19 NOTE — Assessment & Plan Note (Addendum)
--   Being managed by outpatient oncology ?

## 2021-04-19 NOTE — Assessment & Plan Note (Addendum)
Blood pressure currently within goal. ?Patient was on low-dose ramipril at home which was held due to softer blood pressure. ?-Continue to hold ramipril-we can restart if needed ?

## 2021-04-19 NOTE — ED Provider Notes (Signed)
? ?[facility] ?Provider Note ? ? ? Event Date/Time  ? First MD Initiated Contact with Patient 04/19/21 1356   ?  (approximate) ? ?History  ? ?Chief Complaint: Shortness of Breath ? ?HPI ? ?Beth Stanley is a 71 y.o. female with a past medical history of anxiety, diabetes, hypertension presents to the emergency department for shortness of breath.  According to the patient for the past 2 or 3 days she has been feeling short of breath with cough and wheeze.  Patient was seen at [facility] emergency department yesterday so she was treated with some breathing treatments and ultimately discharged home.  She states she has been feeling worse today found to be febrile to 101.6 by EMS, 85% room air saturation with no baseline O2 requirement.  Patient is tachycardic around 95 to 100 bpm currently 107, tachypneic around 30 breaths/min.  Patient states she is currently taking Macrobid for UTI. ? ?Physical Exam  ? ?Triage Vital Signs: ?ED Triage Vitals  ?Enc Vitals Group  ?   BP 04/19/21 1351 135/67  ?   Pulse Rate 04/19/21 1348 (!) 107  ?   Resp 04/19/21 1348 (!) 22  ?   Temp --   ?   Temp Source 04/19/21 1348 Oral  ?   SpO2 04/19/21 1348 91 %  ?   Weight 04/19/21 1347 131 lb 2.8 oz (59.5 kg)  ?   Height 04/19/21 1347 '5\' 3"'$  (1.6 m)  ?   Head Circumference --   ?   Peak Flow --   ?   Pain Score 04/19/21 1347 0  ?   Pain Loc --   ?   Pain Edu? --   ?   Excl. in [place]? --   ? ? ?Most recent vital signs: ?Vitals:  ? 04/19/21 1348 04/19/21 1351  ?BP:  135/67  ?Pulse: (!) 107   ?Resp: (!) 22   ?SpO2: 91%   ? ? ?General: Awake, no distress.  ?CV:  Good peripheral perfusion.  Regular rhythm rate around 100 bpm. ?Resp:  Moderate tachypnea around 30 breaths/min with mild rhonchi bilaterally. ?Abd:  No distention.  Soft, nontender.  No rebound or guarding. ?Other:  No lower extremity edema. ? ? ?ED Results / Procedures / Treatments  ? ?EKG ? ?EKG viewed and interpreted by myself shows sinus tachycardia 107 bpm with  a narrow QRS, normal axis, normal intervals, no concerning ST changes. ? ?RADIOLOGY ? ?I personally reviewed the chest x-ray findings.  No significant abnormality seen on my evaluation. ?Radiology is read the chest x-ray is negative. ? ? ?MEDICATIONS ORDERED IN ED: ?Medications  ?lactated ringers infusion (has no administration in time range)  ?sodium chloride 0.9 % bolus 1,000 mL (has no administration in time range)  ?ceFEPIme (MAXIPIME) 2 g in sodium chloride 0.9 % 100 mL IVPB (has no administration in time range)  ?metroNIDAZOLE (FLAGYL) IVPB 500 mg (has no administration in time range)  ?vancomycin (VANCOCIN) IVPB 1000 mg/200 mL premix (has no administration in time range)  ? ? ? ?IMPRESSION / MDM / ASSESSMENT AND PLAN / ED COURSE  ?I reviewed the triage vital signs and the nursing notes. ? ?Patient presents emergency department for weakness found to be febrile by EMS tachycardic tachypneic hypoxic to 85%.  Given the patient's vital signs we will start broad-spectrum antibiotics and cultures per sepsis protocols we will check labs, chest x-ray.  Differential is quite broad but would include pneumonia, COVID/influenza.  Patient states she  was currently taking Macrobid now has fever and tachycardic differential would also include worsening urinary tract infection.  We will check labs, cultures, chest x-ray, urinalysis, urine culture and continue to closely monitor. ? ?Labs have resulted showing a lactic acid of 1.9, chemistry is overall reassuring.  CBC is reassuring with a normal white blood cell count of 8.6.  INR 1.1.  Chest x-ray is negative.  Given the patient's tachypnea tachycardia and hypoxia with a clear chest x-ray we will proceed with CTA imaging of the chest to rule out pulmonary embolism.  Patient receiving broad-spectrum antibiotics.  Patient will require admission to the hospital service once results are known.  Patient care signed out to oncoming provider. ? ?CRITICAL CARE ?Performed by: Harvest Dark ? ? ?Total critical care time: 30 minutes ? ?Critical care time was exclusive of separately billable procedures and treating other patients. ? ?Critical care was necessary to treat or prevent imminent or life-threatening deterioration. ? ?Critical care was time spent personally by me on the following activities: development of treatment plan with patient and/or surrogate as well as nursing, discussions with consultants, evaluation of patient's response to treatment, examination of patient, obtaining history from patient or surrogate, ordering and performing treatments and interventions, ordering and review of laboratory studies, ordering and review of radiographic studies, pulse oximetry and re-evaluation of patient's condition. ? ? ?FINAL CLINICAL IMPRESSION(S) / ED DIAGNOSES  ? ?Dyspnea ?Sepsis ?Weakness ? ? ?Note:  This document was prepared using Dragon voice recognition software and may include unintentional dictation errors. ?  ?Harvest Dark, MD ?04/19/21 1459 ? ?

## 2021-04-19 NOTE — Assessment & Plan Note (Signed)
-->   Nicotine replacement ?

## 2021-04-19 NOTE — Progress Notes (Signed)
CODE SEPSIS - PHARMACY COMMUNICATION ? ?**Broad Spectrum Antibiotics should be administered within 1 hour of Sepsis diagnosis** ? ?Time Code Sepsis Called/Page Received: 1031 ? ?Antibiotics Ordered: cefepime, vanc ? ?Time of 1st antibiotic administration: 1438 ? ?Additional action taken by pharmacy:   ? ?If necessary, Name of Provider/Nurse Contacted:   ? ? ? ?Noralee Space ,PharmD ?Clinical Pharmacist  ?04/19/2021  3:17 PM  ?

## 2021-04-19 NOTE — Progress Notes (Signed)
This RN at bedside to assess for PIV placement, but it is noted that patient has a dual access power-port. Patient's spouse also at bedside. Discussed at length with patient, spouse, family members, and oncology provider over telephone regarding benefits of accessing port vs new PIV placement. After discussion, patient ultimately decided to not permit port-access at this time, would like PIV only unless gets admitted.  ? ?New PIV placed using [place]. Primary RN made aware.  ?

## 2021-04-19 NOTE — Assessment & Plan Note (Addendum)
Met sepsis criteria with tachycardia and tachypnea, remained afebrile and no leukocytosis. ?Patient received cefepime, vancomycin and metronidazole in ED. ?Most likely a viral cause. ?Antibiotics were discontinued. ?Sepsis ruled out ?

## 2021-04-19 NOTE — Progress Notes (Signed)
PHARMACY -  BRIEF ANTIBIOTIC NOTE  ? ?Pharmacy has received consult(s) for Vancomycin and cefepime from an ED provider.  The patient's profile has been reviewed for ht/wt/allergies/indication/available labs.   ? ?One time order(s) placed by MD for  Vancomycin and cefepime ? ?Further antibiotics/pharmacy consults should be ordered by admitting physician if indicated.       ?                ?Thank you, ?Torrie Namba A ?04/19/2021  2:05 PM  ?

## 2021-04-19 NOTE — Assessment & Plan Note (Signed)
-->   Continue home SSRI ?

## 2021-04-19 NOTE — Progress Notes (Signed)
Elink following Code Sepsis. 

## 2021-04-19 NOTE — ED Triage Notes (Signed)
Pt comes into the ED via EMS from home with c/o SOB, wheezing in all field, was seen at [facility] yesterday for the same ? ?Duoneb x1 ?Albuterol x1 ?Soul medrol '125mg'$  ? ?HR95 ?125/80 ?RR30 ?85%RA,  ?101.6 temp ?CBG179 ?#22gRFA ?

## 2021-04-19 NOTE — ED Triage Notes (Signed)
C/O cough x 1 day, seen throu [facility] ED yesterday.  Arrives today via [facility] with c/o SOB, Wheezing.  Wheezing is audible in triage. ?

## 2021-04-20 ENCOUNTER — Other Ambulatory Visit: Payer: Self-pay

## 2021-04-20 DIAGNOSIS — J189 Pneumonia, unspecified organism: Secondary | ICD-10-CM

## 2021-04-20 DIAGNOSIS — J432 Centrilobular emphysema: Secondary | ICD-10-CM | POA: Diagnosis present

## 2021-04-20 LAB — URINE CULTURE: Culture: NO GROWTH

## 2021-04-20 LAB — RESPIRATORY PANEL BY PCR

## 2021-04-20 LAB — HIV ANTIBODY (ROUTINE TESTING W REFLEX): HIV Screen 4th Generation wRfx: NONREACTIVE

## 2021-04-20 LAB — PROCALCITONIN: Procalcitonin: <0.1 ng/mL

## 2021-04-20 LAB — MRSA NEXT GEN BY PCR, NASAL: MRSA by PCR Next Gen: NOT DETECTED

## 2021-04-20 MED ORDER — UMECLIDINIUM BROMIDE 62.5 MCG/ACT IN AEPB
1.0000 | INHALATION_SPRAY | Freq: Every day | RESPIRATORY_TRACT | Status: DC
Start: 2021-04-20 — End: 2021-04-24
  Administered 2021-04-20 – 2021-04-24 (×5): 1 via RESPIRATORY_TRACT
  Filled 2021-04-20: qty 7

## 2021-04-20 MED ORDER — POLYVINYL ALCOHOL 1.4 % OP SOLN: 1.0000 [drp] | Freq: Two times a day (BID) | OPHTHALMIC | Status: DC | PRN

## 2021-04-20 MED ORDER — IPRATROPIUM-ALBUTEROL 0.5-2.5 (3) MG/3ML IN SOLN
3.0000 mL | RESPIRATORY_TRACT | Status: DC
  Administered 2021-04-20 – 2021-04-21 (×7): 3 mL via RESPIRATORY_TRACT
  Filled 2021-04-20 (×7): qty 3

## 2021-04-20 MED ORDER — FLUTICASONE FUROATE-VILANTEROL 100-25 MCG/ACT IN AEPB
1.0000 | INHALATION_SPRAY | Freq: Every day | RESPIRATORY_TRACT | Status: DC
Start: 2021-04-20 — End: 2021-04-24
  Administered 2021-04-20 – 2021-04-24 (×5): 1 via RESPIRATORY_TRACT
  Filled 2021-04-20: qty 28

## 2021-04-20 MED ORDER — FERROUS SULFATE 325 (65 FE) MG PO TABS
325.0000 mg | ORAL_TABLET | Freq: Every day | ORAL | Status: DC
  Administered 2021-04-20 – 2021-04-24 (×5): 325 mg via ORAL
  Filled 2021-04-20 (×5): qty 1

## 2021-04-20 MED ORDER — ACETAMINOPHEN 325 MG PO TABS
650.0000 mg | ORAL_TABLET | Freq: Four times a day (QID) | ORAL | Status: DC | PRN
  Administered 2021-04-20 – 2021-04-24 (×6): 650 mg via ORAL
  Filled 2021-04-20 (×6): qty 2

## 2021-04-20 MED ORDER — METHYLPREDNISOLONE SODIUM SUCC 125 MG IJ SOLR
80.0000 mg | INTRAMUSCULAR | Status: DC
  Administered 2021-04-20 – 2021-04-23 (×4): 80 mg via INTRAVENOUS
  Filled 2021-04-20 (×4): qty 2

## 2021-04-20 MED ORDER — BUDESONIDE 0.5 MG/2ML IN SUSP
1.0000 mg | Freq: Two times a day (BID) | RESPIRATORY_TRACT | Status: DC
  Administered 2021-04-20 – 2021-04-21 (×2): 1 mg via RESPIRATORY_TRACT
  Filled 2021-04-20 (×2): qty 4

## 2021-04-20 NOTE — Plan of Care (Signed)
  Problem: Activity: Goal: Risk for activity intolerance will decrease Outcome: Progressing   

## 2021-04-20 NOTE — Plan of Care (Signed)
?  Problem: Education: ?Goal: Knowledge of General Education information will improve ?Description: Including pain rating scale, medication(s)/side effects and non-pharmacologic comfort measures ?Outcome: Completed/Met ?  ?Problem: Health Behavior/Discharge Planning: ?Goal: Ability to manage health-related needs will improve ?Outcome: Completed/Met ?  ?Problem: Clinical Measurements: ?Goal: Ability to maintain clinical measurements within normal limits will improve ?Outcome: Completed/Met ?Goal: Will remain free from infection ?Outcome: Completed/Met ?Goal: Diagnostic test results will improve ?Outcome: Completed/Met ?Goal: Respiratory complications will improve ?Outcome: Progressing ?Goal: Cardiovascular complication will be avoided ?Outcome: Completed/Met ?  ?Problem: Clinical Measurements: ?Goal: Will remain free from infection ?Outcome: Completed/Met ?  ?Problem: Clinical Measurements: ?Goal: Diagnostic test results will improve ?Outcome: Completed/Met ?  ?Problem: Clinical Measurements: ?Goal: Respiratory complications will improve ?Outcome: Progressing ?  ?Problem: Clinical Measurements: ?Goal: Cardiovascular complication will be avoided ?Outcome: Completed/Met ?  ?Problem: Activity: ?Goal: Risk for activity intolerance will decrease ?Outcome: Progressing ?  ?Problem: Nutrition: ?Goal: Adequate nutrition will be maintained ?Outcome: Completed/Met ?  ?Problem: Coping: ?Goal: Level of anxiety will decrease ?Outcome: Completed/Met ?  ?Problem: Elimination: ?Goal: Will not experience complications related to bowel motility ?Outcome: Completed/Met ?Goal: Will not experience complications related to urinary retention ?Outcome: Completed/Met ?  ?Problem: Elimination: ?Goal: Will not experience complications related to urinary retention ?Outcome: Completed/Met ?  ?Problem: Pain Managment: ?Goal: General experience of comfort will improve ?Outcome: Completed/Met ?  ?Problem: Safety: ?Goal: Ability to remain free from  injury will improve ?Outcome: Completed/Met ?  ?Problem: Skin Integrity: ?Goal: Risk for impaired skin integrity will decrease ?Outcome: Completed/Met ?  ?

## 2021-04-20 NOTE — Progress Notes (Signed)
?Progress Note ? ? ?Patient: Beth Stanley AYT:[id number] DOB: Sep 27, 1950 DOA: 04/19/2021     1 ?DOS: the patient was seen and examined on 04/20/2021 ?  ?Brief hospital course: ?Taken from H&P. ? ?Ms. Beth Stanley is a 71 year old female with past medical history diabetes, hypertension, anxiety, non-Hodgkin's lymphoma.  Presents to the ED from home via EMS with chief complaint shortness of breath x2 to 3 days, coughing/wheezing.  Seen at [facility] ED yesterday and given some breathing treatments, discharged home.  Feeling worse today and found to be febrile 101.6 Fahrenheit by EMS, 85% room air, patient is a smoker but no home oxygen, in ED patient found to be tachycardic, tachypneic.  Lactic acid 1.9, WBC normal at 8.6, negative chest x-ray.  Received normal saline 1 L bolus, cefepime, metronidazole, vancomycin.  CT a chest was obtained to evaluate for pulmonary embolism, which showed multifocal pneumonia and right upper lobe, left lower lobe, right lower lobe.  No evidence of PE. ?She was admitted for acute hypoxic respiratory failure secondary to COPD exacerbation and multifocal pneumonia.  She was placed on ceftriaxone and Zithromax. ? ?4/8: Patient continued to wheeze although requesting a discharge as there was some religious ceremony for her great grandson today.  Able to maintain her saturation above 90% with ambulation on room air but becomes very short of breath and changed her mind, and agreed to stay another night. ?Procalcitonin was checked today and it was negative.  Discontinuing ceftriaxone, most likely viral pneumonia.  We will continue Zithromax for its lung benefit and COPD exacerbation. ?Checking respiratory viral panel ?Preliminary blood culture stays negative in 24-hour ?Urine cultures negative ? ? ?Assessment and Plan: ?* Multifocal pneumonia ?Patient admitted for concern of multifocal pneumonia.  Procalcitonin negative.  COVID-19 and influenza PCR negative.  MRSA swab negative. ?Most likely viral  cause. ?-Check respiratory viral panel ?-Stop ceftriaxone ?-Continue with Zithromax ?-Adding Solu-Medrol for significant wheezing. ?-Continue with supplemental oxygen-wean as tolerated ?-Continue with supportive care ? ? ?Centrilobular emphysema ([place]) ?Patient seems to have COPD exacerbation, most likely secondary to viral illness. ?-Continue with bronchodilator ?-Continue with oxygen-wean as tolerated ?-Start her on Solu-Medrol ?-Continue with Zithromax ?-Continue with supportive care ? ? ?Hypertension ?Blood pressure currently within goal. ?Patient was on low-dose ramipril at home which was held due to softer blood pressure. ?-Continue to hold ramipril-we can restart if needed ? ?Lymphoma ([place]) ?-- Being managed by outpatient oncology ? ?GERD (gastroesophageal reflux disease) ?--> Continue home PPI ? ?Depression ?--> Continue home SSRI ? ?Smoker ?--> Nicotine replacement ? ?Sepsis ([place]) ?Met sepsis criteria with tachycardia and tachypnea, remained afebrile and no leukocytosis. ?Patient received cefepime, vancomycin and metronidazole in ED. ?Most likely a viral cause. ?Antibiotics were discontinued. ?Sepsis ruled out ? ?  ?Subjective: Patient was still wheezing and short of breath when seen today.  He wants to go home so she can attend her great grandsons ceremony.  Denies any aches and pains. ? ?Physical Exam: ?Vitals:  ? 04/20/21 0044 04/20/21 0454 04/20/21 0819 04/20/21 1126  ?BP: (!) 129/58 (!) 145/83 (!) 125/57 (!) 109/55  ?Pulse: 70 79 92 73  ?Resp: 18 20 (!) 22 15  ?Temp: 98.2 ?F (36.8 ?C) 97.7 ?F (36.5 ?C) (!) 97.5 ?F (36.4 ?C) 98.1 ?F (36.7 ?C)  ?TempSrc: Oral Oral Oral Oral  ?SpO2: 95% 98% 98% 94%  ?Weight:      ?Height:      ? ?General.  Well-developed elderly lady, in no acute distress. ?Pulmonary.  Bilateral wheeze, normal  respiratory effort. ?CV.  Regular rate and rhythm, no JVD, rub or murmur. ?Abdomen.  Soft, nontender, nondistended, BS positive. ?CNS.  Alert and oriented .  No focal neurologic  deficit. ?Extremities.  No edema, no cyanosis, pulses intact and symmetrical. ?Psychiatry.  Judgment and insight appears normal. ? ?Data Reviewed: ?Prior notes, labs and images reviewed ? ?Family Communication: Discussed with patient ? ?Disposition: ?Status is: Inpatient ?Remains inpatient appropriate because: Severity of illness ? ? Planned Discharge Destination: Home ? ?DVT prophylaxis.  Lovenox ? ?Time spent: 45 minutes ? ?This record has been created using Systems analyst. Errors have been sought and corrected,but may not always be located. Such creation errors do not reflect on the standard of care. ? ?Author: ?Lorella Nimrod, MD ?04/20/2021 1:30 PM ? ?For on call review [email or web address].  ?

## 2021-04-20 NOTE — Assessment & Plan Note (Signed)
Patient seems to have COPD exacerbation, most likely secondary to viral illness. ?-Continue with bronchodilator ?-Continue with oxygen-wean as tolerated ?-Start her on Solu-Medrol ?-Continue with Zithromax ?-Continue with supportive care ? ?

## 2021-04-20 NOTE — Hospital Course (Addendum)
Taken from H&P. ? ?Ms. Beth Stanley is a 71 year old female with past medical history diabetes, hypertension, anxiety, non-Hodgkin's lymphoma.  Presents to the ED from home via EMS with chief complaint shortness of breath x2 to 3 days, coughing/wheezing.  Seen at [facility] ED yesterday and given some breathing treatments, discharged home.  Feeling worse today and found to be febrile 101.6 Fahrenheit by EMS, 85% room air, patient is a smoker but no home oxygen, in ED patient found to be tachycardic, tachypneic.  Lactic acid 1.9, WBC normal at 8.6, negative chest x-ray.  Received normal saline 1 L bolus, cefepime, metronidazole, vancomycin.  CT a chest was obtained to evaluate for pulmonary embolism, which showed multifocal pneumonia and right upper lobe, left lower lobe, right lower lobe.  No evidence of PE. ?She was admitted for acute hypoxic respiratory failure secondary to COPD exacerbation and multifocal pneumonia.  She was placed on ceftriaxone and Zithromax. ? ?4/8: Patient continued to wheeze although requesting a discharge as there was some religious ceremony for her great grandson today.  Able to maintain her saturation above 90% with ambulation on room air but becomes very short of breath and changed her mind, and agreed to stay another night. ?Procalcitonin was checked today and it was negative.  Discontinuing ceftriaxone, most likely viral pneumonia.  We will continue Zithromax for its lung benefit and COPD exacerbation. ?Checking respiratory viral panel ?Preliminary blood culture stays negative  ?Urine cultures negative ? ?4/9: Respiratory viral panel positive for metapneumovirus. ?Patient remained quite dyspneic and continue to have wheeze. ? ?4/10: Patient continued to have significant wheezing and remained short of breath. ? ?4/11: Patient continued to have significant wheeze, some subjective improvement.  She was desaturating on room air and continued to require 3 L of oxygen.  Most likely will be  discharged on 3 L of oxygen which was ordered . ? ?4/12: Patient continued to improve and should be able to discharge today with a slow tapering course of prednisone.  We discussed the importance of compliance especially with antidiabetics as steroid will increase her blood sugar levels.  She is also being given 3 L of oxygen to be used all the time.  She was counseled against smoking.  Husband at bedside. ? ?Patient will continue current management and will follow-up with her providers closely for further recommendations. ? ?

## 2021-04-21 DIAGNOSIS — J189 Pneumonia, unspecified organism: Secondary | ICD-10-CM | POA: Diagnosis not present

## 2021-04-21 MED ORDER — BUDESONIDE 0.5 MG/2ML IN SUSP
0.5000 mg | Freq: Two times a day (BID) | RESPIRATORY_TRACT | Status: DC
  Administered 2021-04-21 – 2021-04-24 (×6): 0.5 mg via RESPIRATORY_TRACT
  Filled 2021-04-21 (×6): qty 2

## 2021-04-21 MED ORDER — RAMIPRIL 2.5 MG PO CAPS
2.5000 mg | ORAL_CAPSULE | Freq: Every day | ORAL | Status: DC
  Administered 2021-04-21 – 2021-04-24 (×4): 2.5 mg via ORAL
  Filled 2021-04-21 (×5): qty 1

## 2021-04-21 MED ORDER — ALBUTEROL SULFATE (2.5 MG/3ML) 0.083% IN NEBU
2.5000 mg | INHALATION_SOLUTION | RESPIRATORY_TRACT | Status: DC | PRN
  Administered 2021-04-21 – 2021-04-24 (×3): 2.5 mg via RESPIRATORY_TRACT
  Filled 2021-04-21 (×3): qty 3

## 2021-04-21 MED ORDER — POLYETHYLENE GLYCOL 3350 17 G PO PACK
17.0000 g | PACK | Freq: Every day | ORAL | Status: DC | PRN
  Administered 2021-04-21: 17 g via ORAL
  Filled 2021-04-21: qty 1

## 2021-04-21 MED ORDER — IPRATROPIUM-ALBUTEROL 0.5-2.5 (3) MG/3ML IN SOLN
3.0000 mL | Freq: Four times a day (QID) | RESPIRATORY_TRACT | Status: DC
  Administered 2021-04-21 – 2021-04-24 (×12): 3 mL via RESPIRATORY_TRACT
  Filled 2021-04-21 (×12): qty 3

## 2021-04-21 NOTE — Assessment & Plan Note (Signed)
Patient admitted for concern of multifocal pneumonia.  Procalcitonin negative.  COVID-19 and influenza PCR negative.  MRSA swab negative. ?Respiratory viral panel was positive for metapneumovirus ?-Stop ceftriaxone ?-Continue with Zithromax ?-Continue Solu-Medrol for significant wheezing. ?-Continue with supplemental oxygen-wean as tolerated ?-Continue with supportive care ? ?

## 2021-04-21 NOTE — Assessment & Plan Note (Signed)
Blood pressure currently within goal. ?Patient was on low-dose ramipril at home which was held due to softer blood pressure initially. ?-Restart home ramipril as blood pressure slowly trending up ?

## 2021-04-21 NOTE — Progress Notes (Signed)
?Progress Note ? ? ?Patient: Beth Stanley ZOX:[id number] DOB: 1950/11/23 DOA: 04/19/2021     2 ?DOS: the patient was seen and examined on 04/21/2021 ?  ?Brief hospital course: ?Taken from H&P. ? ?Ms. Mangan is a 71 year old female with past medical history diabetes, hypertension, anxiety, non-Hodgkin's lymphoma.  Presents to the ED from home via EMS with chief complaint shortness of breath x2 to 3 days, coughing/wheezing.  Seen at [facility] ED yesterday and given some breathing treatments, discharged home.  Feeling worse today and found to be febrile 101.6 Fahrenheit by EMS, 85% room air, patient is a smoker but no home oxygen, in ED patient found to be tachycardic, tachypneic.  Lactic acid 1.9, WBC normal at 8.6, negative chest x-ray.  Received normal saline 1 L bolus, cefepime, metronidazole, vancomycin.  CT a chest was obtained to evaluate for pulmonary embolism, which showed multifocal pneumonia and right upper lobe, left lower lobe, right lower lobe.  No evidence of PE. ?She was admitted for acute hypoxic respiratory failure secondary to COPD exacerbation and multifocal pneumonia.  She was placed on ceftriaxone and Zithromax. ? ?4/8: Patient continued to wheeze although requesting a discharge as there was some religious ceremony for her great grandson today.  Able to maintain her saturation above 90% with ambulation on room air but becomes very short of breath and changed her mind, and agreed to stay another night. ?Procalcitonin was checked today and it was negative.  Discontinuing ceftriaxone, most likely viral pneumonia.  We will continue Zithromax for its lung benefit and COPD exacerbation. ?Checking respiratory viral panel ?Preliminary blood culture stays negative  ?Urine cultures negative ? ?4/9: Respiratory viral panel positive for metapneumovirus. ?Patient remained quite dyspneic and continue to have wheeze. ? ? ? ? ?Assessment and Plan: ?* Multifocal pneumonia ?Patient admitted for concern of multifocal  pneumonia.  Procalcitonin negative.  COVID-19 and influenza PCR negative.  MRSA swab negative. ?Respiratory viral panel was positive for metapneumovirus ?-Stop ceftriaxone ?-Continue with Zithromax ?-Continue Solu-Medrol for significant wheezing. ?-Continue with supplemental oxygen-wean as tolerated ?-Continue with supportive care ? ? ?Centrilobular emphysema ([place]) ?Patient seems to have COPD exacerbation, most likely secondary to viral illness. ?-Continue with bronchodilator ?-Continue with oxygen-wean as tolerated ?-Continue Solu-Medrol ?-Continue with Zithromax ?-Continue with supportive care ? ? ?Hypertension ?Blood pressure currently within goal. ?Patient was on low-dose ramipril at home which was held due to softer blood pressure initially. ?-Restart home ramipril as blood pressure slowly trending up ? ?Lymphoma ([place]) ?-- Being managed by outpatient oncology ? ?GERD (gastroesophageal reflux disease) ?--> Continue home PPI ? ?Depression ?--> Continue home SSRI ? ?Smoker ?--> Nicotine replacement ? ?Sepsis ([place]) ?Met sepsis criteria with tachycardia and tachypnea, remained afebrile and no leukocytosis. ?Patient received cefepime, vancomycin and metronidazole in ED. ?Most likely a viral cause. ?Antibiotics were discontinued. ?Sepsis ruled out ? ? ?Subjective: Patient continued to feel very short of breath, worsening dyspnea with minor exertion.  Husband at bedside. ? ?Physical Exam: ?Vitals:  ? 04/21/21 0101 04/21/21 0552 04/21/21 0809 04/21/21 1213  ?BP:  132/66 131/75 (!) 133/59  ?Pulse:  67 71 98  ?Resp:  15 16   ?Temp:  97.6 ?F (36.4 ?C) 97.7 ?F (36.5 ?C) 97.8 ?F (36.6 ?C)  ?TempSrc:      ?SpO2: 94% 96% 96% 100%  ?Weight:      ?Height:      ? ?General.  Well-developed lady, in no acute distress. ?Pulmonary.  Bilateral scattered wheeze, normal respiratory effort. ?CV.  Regular rate  and rhythm, no JVD, rub or murmur. ?Abdomen.  Soft, nontender, nondistended, BS positive. ?CNS.  Alert and oriented .  No focal  neurologic deficit. ?Extremities.  No edema, no cyanosis, pulses intact and symmetrical. ?Psychiatry.  Judgment and insight appears normal. ? ?Data Reviewed: ?Prior notes and labs reviewed ? ?Family Communication: Discussed with husband at bedside ? ?Disposition: ?Status is: Inpatient ?Remains inpatient appropriate because: Severity of illness ? ? Planned Discharge Destination: Home ? ?DVT prophylaxis.  Lovenox ? ?Time spent: 45 minutes ? ?This record has been created using Systems analyst. Errors have been sought and corrected,but may not always be located. Such creation errors do not reflect on the standard of care. ? ?Author: ?Lorella Nimrod, MD ?04/21/2021 3:11 PM ? ?For on call review [email or web address].  ?

## 2021-04-21 NOTE — Plan of Care (Signed)
  Problem: Clinical Measurements: Goal: Respiratory complications will improve Outcome: Progressing   Problem: Activity: Goal: Risk for activity intolerance will decrease Outcome: Progressing   

## 2021-04-21 NOTE — Assessment & Plan Note (Signed)
Patient seems to have COPD exacerbation, most likely secondary to viral illness. ?-Continue with bronchodilator ?-Continue with oxygen-wean as tolerated ?-Continue Solu-Medrol ?-Continue with Zithromax ?-Continue with supportive care ? ?

## 2021-04-22 DIAGNOSIS — J189 Pneumonia, unspecified organism: Secondary | ICD-10-CM | POA: Diagnosis not present

## 2021-04-22 LAB — GLUCOSE, CAPILLARY
Glucose-Capillary: 194 mg/dL — ABNORMAL HIGH (ref 70–99)
Glucose-Capillary: 293 mg/dL — ABNORMAL HIGH (ref 70–99)

## 2021-04-22 MED ORDER — DM-GUAIFENESIN ER 30-600 MG PO TB12
1.0000 | ORAL_TABLET | Freq: Two times a day (BID) | ORAL | Status: DC
  Administered 2021-04-22 – 2021-04-24 (×5): 1 via ORAL
  Filled 2021-04-22 (×5): qty 1

## 2021-04-22 MED ORDER — INSULIN ASPART 100 UNIT/ML IJ SOLN
0.0000 [IU] | Freq: Three times a day (TID) | INTRAMUSCULAR | Status: DC
  Administered 2021-04-22 – 2021-04-23 (×3): 3 [IU] via SUBCUTANEOUS
  Administered 2021-04-23: 2 [IU] via SUBCUTANEOUS
  Administered 2021-04-24: 8 [IU] via SUBCUTANEOUS
  Administered 2021-04-24: 3 [IU] via SUBCUTANEOUS
  Filled 2021-04-22 (×6): qty 1

## 2021-04-22 NOTE — Progress Notes (Signed)
?Progress Note ? ? ?Patient: Beth Stanley YQM:[id number] DOB: 07-15-1950 DOA: 04/19/2021     3 ?DOS: the patient was seen and examined on 04/22/2021 ?  ?Brief hospital course: ?Taken from H&P. ? ?Ms. Contee is a 71 year old female with past medical history diabetes, hypertension, anxiety, non-Hodgkin's lymphoma.  Presents to the ED from home via EMS with chief complaint shortness of breath x2 to 3 days, coughing/wheezing.  Seen at [facility] ED yesterday and given some breathing treatments, discharged home.  Feeling worse today and found to be febrile 101.6 Fahrenheit by EMS, 85% room air, patient is a smoker but no home oxygen, in ED patient found to be tachycardic, tachypneic.  Lactic acid 1.9, WBC normal at 8.6, negative chest x-ray.  Received normal saline 1 L bolus, cefepime, metronidazole, vancomycin.  CT a chest was obtained to evaluate for pulmonary embolism, which showed multifocal pneumonia and right upper lobe, left lower lobe, right lower lobe.  No evidence of PE. ?She was admitted for acute hypoxic respiratory failure secondary to COPD exacerbation and multifocal pneumonia.  She was placed on ceftriaxone and Zithromax. ? ?4/8: Patient continued to wheeze although requesting a discharge as there was some religious ceremony for her great grandson today.  Able to maintain her saturation above 90% with ambulation on room air but becomes very short of breath and changed her mind, and agreed to stay another night. ?Procalcitonin was checked today and it was negative.  Discontinuing ceftriaxone, most likely viral pneumonia.  We will continue Zithromax for its lung benefit and COPD exacerbation. ?Checking respiratory viral panel ?Preliminary blood culture stays negative  ?Urine cultures negative ? ?4/9: Respiratory viral panel positive for metapneumovirus. ?Patient remained quite dyspneic and continue to have wheeze. ? ?4/10: Patient continued to have significant wheezing and remained short of  breath. ? ? ?Assessment and Plan: ?* Multifocal pneumonia ?Patient admitted for concern of multifocal pneumonia.  Procalcitonin negative.  COVID-19 and influenza PCR negative.  MRSA swab negative. ?Respiratory viral panel was positive for metapneumovirus ?Ceftriaxone was discontinued. ?-Continue with Zithromax ?-Continue Solu-Medrol for significant wheezing. ?-Continue with supplemental oxygen-wean as tolerated ?-Continue with supportive care ? ? ?Centrilobular emphysema ([place]) ?Patient seems to have COPD exacerbation, most likely secondary to viral illness. ?-Continue with bronchodilator ?-Continue with oxygen-wean as tolerated ?-Continue Solu-Medrol ?-Continue with Zithromax ?-Continue with supportive care ? ? ?Hypertension ?Blood pressure mildly elevated. ?-Continue home ramipril ?-Continue to monitor ? ?Lymphoma ([place]) ?-- Being managed by outpatient oncology ? ?GERD (gastroesophageal reflux disease) ?--> Continue home PPI ? ?Depression ?--> Continue home SSRI ? ?Smoker ?--> Nicotine replacement ? ?Sepsis ([place]) ?Met sepsis criteria with tachycardia and tachypnea, remained afebrile and no leukocytosis. ?Patient received cefepime, vancomycin and metronidazole in ED. ?Most likely a viral cause. ?Antibiotics were discontinued. ?Sepsis ruled out ? ? ?Subjective: Patient remained short of breath with minor exertion.  Continued to wheeze, saturating well on 3 L of oxygen. ? ?Physical Exam: ?Vitals:  ? 04/21/21 2045 04/22/21 0131 04/22/21 0531 04/22/21 1220  ?BP:   (!) 154/84 134/72  ?Pulse:   72 83  ?Resp:   18 20  ?Temp:   (!) 97.3 ?F (36.3 ?C) 97.8 ?F (36.6 ?C)  ?TempSrc:   Oral   ?SpO2: 96% 96% 99% 99%  ?Weight:      ?Height:      ? ?General.     In no acute distress. ?Pulmonary.wheeze bilaterally, normal respiratory effort. ?CV.  Regular rate and rhythm, no JVD, rub or murmur. ?Abdomen.  Soft,  nontender, nondistended, BS positive. ?CNS.  Alert and oriented .  No focal neurologic deficit. ?Extremities.  No edema, no  cyanosis, pulses intact and symmetrical. ?Psychiatry.  Judgment and insight appears normal. ? ?Data Reviewed: ?Prior notes and labs reviewed ? ?Family Communication: Discussed with husband at bedside ? ?Disposition: ?Status is: Inpatient ?Remains inpatient appropriate because: Severity of illness ? ? Planned Discharge Destination: Home with Home Health ? ?DVT prophylaxis.  Lovenox ? ?Time spent: 40 minutes ? ?This record has been created using Systems analyst. Errors have been sought and corrected,but may not always be located. Such creation errors do not reflect on the standard of care. ? ?Author: ?Lorella Nimrod, MD ?04/22/2021 2:24 PM ? ?For on call review [email or web address].  ?

## 2021-04-22 NOTE — Assessment & Plan Note (Signed)
Patient admitted for concern of multifocal pneumonia.  Procalcitonin negative.  COVID-19 and influenza PCR negative.  MRSA swab negative. ?Respiratory viral panel was positive for metapneumovirus ?Ceftriaxone was discontinued. ?-Continue with Zithromax ?-Continue Solu-Medrol for significant wheezing. ?-Continue with supplemental oxygen-wean as tolerated ?-Continue with supportive care ? ?

## 2021-04-22 NOTE — Plan of Care (Signed)
  Problem: Clinical Measurements: Goal: Respiratory complications will improve Outcome: Progressing   Problem: Activity: Goal: Risk for activity intolerance will decrease Outcome: Progressing   

## 2021-04-22 NOTE — Assessment & Plan Note (Signed)
Patient seems to have COPD exacerbation, most likely secondary to viral illness. ?-Continue with bronchodilator ?-Continue with oxygen-wean as tolerated ?-Continue Solu-Medrol ?-Continue with Zithromax ?-Continue with supportive care ? ?

## 2021-04-22 NOTE — Progress Notes (Signed)
SATURATION QUALIFICATIONS: (This note is used to comply with regulatory documentation for home oxygen)  Patient Saturations on Room Air at Rest = 90%  Patient Saturations on Room Air while Ambulating = 86%  Patient Saturations on 3 Liters of oxygen while Ambulating = 91%  Please briefly explain why patient needs home oxygen: 

## 2021-04-22 NOTE — Assessment & Plan Note (Signed)
Blood pressure mildly elevated. ?-Continue home ramipril ?-Continue to monitor ?

## 2021-04-23 DIAGNOSIS — J189 Pneumonia, unspecified organism: Secondary | ICD-10-CM | POA: Diagnosis not present

## 2021-04-23 LAB — GLUCOSE, CAPILLARY
Glucose-Capillary: 157 mg/dL — ABNORMAL HIGH (ref 70–99)
Glucose-Capillary: 126 mg/dL — ABNORMAL HIGH (ref 70–99)
Glucose-Capillary: 159 mg/dL — ABNORMAL HIGH (ref 70–99)
Glucose-Capillary: 201 mg/dL — ABNORMAL HIGH (ref 70–99)

## 2021-04-23 LAB — HEMOGLOBIN A1C
Hgb A1c MFr Bld: 6.5 % — ABNORMAL HIGH (ref 4.8–5.6)
Mean Plasma Glucose: 139.85 mg/dL

## 2021-04-23 MED ORDER — PREDNISONE 50 MG PO TABS
60.0000 mg | ORAL_TABLET | Freq: Every day | ORAL | Status: DC
  Administered 2021-04-24: 60 mg via ORAL
  Filled 2021-04-23: qty 1

## 2021-04-23 MED ORDER — BUTALBITAL-APAP-CAFFEINE 50-325-40 MG PO TABS
1.0000 | ORAL_TABLET | Freq: Once | ORAL | Status: AC
  Administered 2021-04-23: 1 via ORAL
  Filled 2021-04-23: qty 1

## 2021-04-23 NOTE — Assessment & Plan Note (Signed)
-->   Nicotine replacement ?

## 2021-04-23 NOTE — Progress Notes (Signed)
?Progress Note ? ? ?Patient: Beth Stanley OAC:[id number] DOB: [birth year] DOA: 04/19/2021     4 ?DOS: the patient was seen and examined on 04/23/2021 ?  ?Brief hospital course: ?Taken from H&P. ? ?Beth Stanley is a 71 year old female with past medical history diabetes, hypertension, anxiety, non-Hodgkin's lymphoma.  Presents to the ED from home via EMS with chief complaint shortness of breath x2 to 3 days, coughing/wheezing.  Seen at [facility] ED yesterday and given some breathing treatments, discharged home.  Feeling worse today and found to be febrile 101.6 Fahrenheit by EMS, 85% room air, patient is a smoker but no home oxygen, in ED patient found to be tachycardic, tachypneic.  Lactic acid 1.9, WBC normal at 8.6, negative chest x-ray.  Received normal saline 1 L bolus, cefepime, metronidazole, vancomycin.  CT a chest was obtained to evaluate for pulmonary embolism, which showed multifocal pneumonia and right upper lobe, left lower lobe, right lower lobe.  No evidence of PE. ?She was admitted for acute hypoxic respiratory failure secondary to COPD exacerbation and multifocal pneumonia.  She was placed on ceftriaxone and Zithromax. ? ?4/8: Patient continued to wheeze although requesting a discharge as there was some religious ceremony for her great grandson today.  Able to maintain her saturation above 90% with ambulation on room air but becomes very short of breath and changed her mind, and agreed to stay another night. ?Procalcitonin was checked today and it was negative.  Discontinuing ceftriaxone, most likely viral pneumonia.  We will continue Zithromax for its lung benefit and COPD exacerbation. ?Checking respiratory viral panel ?Preliminary blood culture stays negative  ?Urine cultures negative ? ?4/9: Respiratory viral panel positive for metapneumovirus. ?Patient remained quite dyspneic and continue to have wheeze. ? ?4/10: Patient continued to have significant wheezing and remained short of breath. ? ?4/11:  Patient continued to have significant wheeze, some subjective improvement.  She was desaturating on room air and continued to require 3 L of oxygen.  Most likely will be discharged on 3 L of oxygen which was ordered today. ? ? ?Assessment and Plan: ?* Multifocal pneumonia ?Patient admitted for concern of multifocal pneumonia.  Procalcitonin negative.  COVID-19 and influenza PCR negative.  MRSA swab negative. ?Respiratory viral panel was positive for metapneumovirus ?Ceftriaxone was discontinued. ?-Continue with Zithromax and complete a 5-day course ?-Switch Solu-Medrol with 60 mg of prednisone-patient will need a slow taper ?-Continue with supplemental oxygen-wean as tolerated ?-Continue with supportive care ? ? ?Centrilobular emphysema (Beth Stanley) ?Patient seems to have COPD exacerbation, most likely secondary to viral illness. ?-Continue with bronchodilator ?-Continue with oxygen-wean as tolerated ?-Continue Solu-Medrol ?-Continue with Zithromax ?-Continue with supportive care ? ? ?Hypertension ?Blood pressure mildly elevated. ?-Continue home ramipril ?-Continue to monitor ? ?Lymphoma (Beth Stanley) ?-- Being managed by outpatient oncology ? ?GERD (gastroesophageal reflux disease) ?--> Continue home PPI ? ?Depression ?--> Continue home SSRI ? ?Smoker ?--> Nicotine replacement ? ?Sepsis ([place]) ?Met sepsis criteria with tachycardia and tachypnea, remained afebrile and no leukocytosis. ?Patient received cefepime, vancomycin and metronidazole in ED. ?Most likely a viral cause. ?Antibiotics were discontinued. ?Sepsis ruled out ? ? ?Subjective: Patient thinks that she is slowly improving as she is not becoming that short of breath with minor exertion.  Continues to have significant wheeze.  Husband at bedside. ? ?Physical Exam: ?Vitals:  ? 04/23/21 0139 04/23/21 0538 04/23/21 0732 04/23/21 1203  ?BP:  125/61 129/69   ?Pulse:  66 64   ?Resp:  16 15   ?Temp:  98.4 ?F (  36.9 ?C) 98 ?F (36.7 ?C)   ?TempSrc:      ?SpO2: 98% 95% 94% 94%   ?Weight:      ?Height:      ? ?General.  Well-developed lady, in no acute distress. ?Pulmonary.  Bilateral wheeze, normal respiratory effort. ?CV.  Regular rate and rhythm, no JVD, rub or murmur. ?Abdomen.  Soft, nontender, nondistended, BS positive. ?CNS.  Alert and oriented .  No focal neurologic deficit. ?Extremities.  No edema, no cyanosis, pulses intact and symmetrical. ?Psychiatry.  Judgment and insight appears normal. ? ?Data Reviewed: ?Prior notes and labs reviewed ? ?Family Communication: Discussed with husband at bedside ? ?Disposition: ?Status is: Inpatient ?Remains inpatient appropriate because: Severity of illness ? ? Planned Discharge Destination: Home ? ?DVT prophylaxis.  Lovenox ? ?Time spent: 40 minutes ? ?This record has been created using Systems analyst. Errors have been sought and corrected,but may not always be located. Such creation errors do not reflect on the standard of care. ? ?Author: ?Lorella Nimrod, MD ?04/23/2021 3:03 PM ? ?For on call review [email or web address]  ?

## 2021-04-23 NOTE — Assessment & Plan Note (Signed)
Patient admitted for concern of multifocal pneumonia.  Procalcitonin negative.  COVID-19 and influenza PCR negative.  MRSA swab negative. ?Respiratory viral panel was positive for metapneumovirus ?Ceftriaxone was discontinued. ?-Continue with Zithromax and complete a 5-day course ?-Switch Solu-Medrol with 60 mg of prednisone-patient will need a slow taper ?-Continue with supplemental oxygen-wean as tolerated ?-Continue with supportive care ? ?

## 2021-04-23 NOTE — Progress Notes (Signed)
SATURATION QUALIFICATIONS: (This note is used to comply with regulatory documentation for home oxygen) ? ?Patient Saturations on Room Air at Rest = 97% ? ?Patient Saturations on Room Air while Ambulating = 80% ? ?Patient Saturations on 3 Liters of oxygen while Ambulating = 92% ? ?Please briefly explain why patient needs home oxygen: pt desats while ambulating below normal needs. ?

## 2021-04-24 DIAGNOSIS — J189 Pneumonia, unspecified organism: Secondary | ICD-10-CM | POA: Diagnosis not present

## 2021-04-24 LAB — CULTURE, BLOOD (ROUTINE X 2)
Culture: NO GROWTH
Culture: NO GROWTH
Special Requests: ADEQUATE

## 2021-04-24 LAB — GLUCOSE, CAPILLARY
Glucose-Capillary: 169 mg/dL — ABNORMAL HIGH (ref 70–99)
Glucose-Capillary: 283 mg/dL — ABNORMAL HIGH (ref 70–99)

## 2021-04-24 MED ORDER — PREDNISONE 10 MG PO TABS: 60.0000 mg | ORAL_TABLET | Freq: Every day | ORAL | 0 refills | Status: DC

## 2021-04-24 MED ORDER — IPRATROPIUM-ALBUTEROL 0.5-2.5 (3) MG/3ML IN SOLN: 3.0000 mL | Freq: Four times a day (QID) | RESPIRATORY_TRACT | 0 refills | Status: DC | PRN

## 2021-04-24 MED ORDER — POLYETHYLENE GLYCOL 3350 17 G PO PACK: 17.0000 g | PACK | Freq: Every day | ORAL | 0 refills | Status: DC | PRN

## 2021-04-24 MED ORDER — NICOTINE 21 MG/24HR TD PT24: 21.0000 mg | MEDICATED_PATCH | Freq: Every day | TRANSDERMAL | 0 refills | Status: DC

## 2021-04-24 MED ORDER — DM-GUAIFENESIN ER 30-600 MG PO TB12
1.0000 | ORAL_TABLET | Freq: Two times a day (BID) | ORAL | 0 refills | Status: DC
Start: 2021-04-24 — End: 2022-02-21

## 2021-04-24 NOTE — Care Management Important Message (Signed)
Important Message ? ?Patient Details  ?Name: Beth Stanley ?MRN: [id number] ?Date of Birth: February 18, 1950 ? ? ?Medicare Important Message Given:  Yes ? ?Patient is in an isolation room so I reviewed the Important Message from Medicare by phone. Husband asked his wife, and she stated she is in agreement with the discharge.  I wished her well and thanked them for their time.  ? ? ?Juliann Pulse A Chakia Counts ?04/24/2021, 11:38 AM ?

## 2021-04-24 NOTE — Discharge Summary (Signed)
?Physician Discharge Summary ?  ?Patient: Beth Stanley MRN: [id number] DOB: Oct 04, 1950  ?Admit date:     04/19/2021  ?Discharge date: 04/24/21  ?Discharge Physician: Beth Stanley  ? ?PCP: Beth Riches, NP  ? ?Recommendations at discharge:  ?Please obtain CBC and BMP in 1 week ?Patient is being discharged with home oxygen, please ensure the compliance ?Patient need a close monitoring of her diabetes and need continuous education about compliance as she was not taking her home antidiabetics. ?She is also being given a prolonged course of steroid with a slow taper, patient was educated that it can increase her blood glucose levels so she should be little extra careful. ?Follow-up with primary care provider within a week. ? ?Discharge Diagnoses: ?Principal Problem: ?  Multifocal pneumonia ?Active Problems: ?  Centrilobular emphysema ([place]) ?  Hypertension ?  Lymphoma ([place]) ?  GERD (gastroesophageal reflux disease) ?  Depression ?  Smoker ?  Sepsis ([place]) ? ? ?Hospital Course: ?Taken from H&P. ? ?[name] is a 71 year old female with past medical history diabetes, hypertension, anxiety, non-Hodgkin's lymphoma.  Presents to the ED from home via EMS with chief complaint shortness of breath x2 to 3 days, coughing/wheezing.  Seen at [facility] ED yesterday and given some breathing treatments, discharged home.  Feeling worse today and found to be febrile 101.6 Fahrenheit by EMS, 85% room air, patient is a smoker but no home oxygen, in ED patient found to be tachycardic, tachypneic.  Lactic acid 1.9, WBC normal at 8.6, negative chest x-ray.  Received normal saline 1 L bolus, cefepime, metronidazole, vancomycin.  CT a chest was obtained to evaluate for pulmonary embolism, which showed multifocal pneumonia and right upper lobe, left lower lobe, right lower lobe.  No evidence of PE. ?She was admitted for acute hypoxic respiratory failure secondary to COPD exacerbation and multifocal pneumonia.  She was placed on ceftriaxone and  Zithromax. ? ?4/8: Patient continued to wheeze although requesting a discharge as there was some religious ceremony for her great grandson today.  Able to maintain her saturation above 90% with ambulation on room air but becomes very short of breath and changed her mind, and agreed to stay another night. ?Procalcitonin was checked today and it was negative.  Discontinuing ceftriaxone, most likely viral pneumonia.  We will continue Zithromax for its lung benefit and COPD exacerbation. ?Checking respiratory viral panel ?Preliminary blood culture stays negative  ?Urine cultures negative ? ?4/9: Respiratory viral panel positive for metapneumovirus. ?Patient remained quite dyspneic and continue to have wheeze. ? ?4/10: Patient continued to have significant wheezing and remained short of breath. ? ?4/11: Patient continued to have significant wheeze, some subjective improvement.  She was desaturating on room air and continued to require 3 L of oxygen.  Most likely will be discharged on 3 L of oxygen which was ordered . ? ?4/12: Patient continued to improve and should be able to discharge today with a slow tapering course of prednisone.  We discussed the importance of compliance especially with antidiabetics as steroid will increase her blood sugar levels.  She is also being given 3 L of oxygen to be used all the time.  She was counseled against smoking.  Husband at bedside. ? ?Patient will continue current management and will follow-up with her providers closely for further recommendations. ? ? ?Assessment and Plan: ?* Multifocal pneumonia ?Patient admitted for concern of multifocal pneumonia.  Procalcitonin negative.  COVID-19 and influenza PCR negative.  MRSA swab negative. ?Respiratory viral panel was  positive for metapneumovirus ?Ceftriaxone was discontinued. ?-Continue with Zithromax and complete a 5-day course ?-Switch Solu-Medrol with 60 mg of prednisone-patient will need a slow taper ?-Continue with supplemental  oxygen-wean as tolerated ?-Continue with supportive care ? ? ?Centrilobular emphysema ([place]) ?Patient seems to have COPD exacerbation, most likely secondary to viral illness. ?-Continue with bronchodilator ?-Continue with oxygen-wean as tolerated ?-Continue Solu-Medrol ?-Continue with Zithromax ?-Continue with supportive care ? ? ?Hypertension ?Blood pressure mildly elevated. ?-Continue home ramipril ?-Continue to monitor ? ?Lymphoma ([place]) ?-- Being managed by outpatient oncology ? ?GERD (gastroesophageal reflux disease) ?--> Continue home PPI ? ?Depression ?--> Continue home SSRI ? ?Smoker ?--> Nicotine replacement ? ?Sepsis ([place]) ?Met sepsis criteria with tachycardia and tachypnea, remained afebrile and no leukocytosis. ?Patient received cefepime, vancomycin and metronidazole in ED. ?Most likely a viral cause. ?Antibiotics were discontinued. ?Sepsis ruled out ? ? ?Consultants: None ?Procedures performed: None ?Disposition: Home ?Diet recommendation:  ?Discharge Diet Orders (From admission, onward)  ? ?  Start     Ordered  ? 04/24/21 0000  Diet - low sodium heart healthy       ? 04/24/21 1107  ? ?  ?  ? ?  ? ?Cardiac and Carb modified diet ?DISCHARGE MEDICATION: ?Allergies as of 04/24/2021   ? ?   Reactions  ? Aspirin   ? Stomach cramp  ? Latex Swelling  ? Other   ? Tape Rash  ? Makes Whelps on Skin ?Makes Whelps on Skin  ? ?  ? ?  ?Medication List  ?  ? ?STOP taking these medications   ? ?clindamycin 1 % gel ?Commonly known as: CLINDAGEL ?  ?dexamethasone 2 MG tablet ?Commonly known as: DECADRON ?  ?lidocaine-prilocaine cream ?Commonly known as: EMLA ?  ?nitrofurantoin (macrocrystal-monohydrate) 100 MG capsule ?Commonly known as: MACROBID ?  ?ondansetron 4 MG tablet ?Commonly known as: ZOFRAN ?  ?prochlorperazine 10 MG tablet ?Commonly known as: COMPAZINE ?  ? ?  ? ?TAKE these medications   ? ?acetaminophen 500 MG tablet ?Commonly known as: TYLENOL ?Take 1,000 mg by mouth every 6 (six) hours as needed. ?  ?albuterol  108 (90 Base) MCG/ACT inhaler ?Commonly known as: VENTOLIN HFA ?Inhale 1 puff into the lungs every 6 (six) hours as needed for wheezing. ?  ?Buprenorphine HCl-Naloxone HCl 8-2 MG Film ?Place 1 strip under the tongue 3 (three) times daily. ?  ?carboxymethylcellulose 0.5 % Soln ?Commonly known as: REFRESH PLUS ?Place 1 drop into the left eye 2 (two) times daily. ?  ?Cholecalciferol 25 MCG (1000 UT) tablet ?Take 1 tablet by mouth daily. ?  ?cyanocobalamin 1000 MCG tablet ?Take 1 tablet by mouth daily. ?  ?dextromethorphan-guaiFENesin 30-600 MG 12hr tablet ?Commonly known as: [place] DM ?Take 1 tablet by mouth 2 (two) times daily. ?  ?diclofenac Sodium 1 % Gel ?Commonly known as: VOLTAREN ?Apply 4 g topically 4 (four) times daily. ?  ?ferrous sulfate 325 (65 FE) MG tablet ?Take 325 mg by mouth daily. ?  ?fluticasone-salmeterol 250-50 MCG/ACT Aepb ?Commonly known as: ADVAIR ?Inhale 1 puff into the lungs 2 (two) times daily as needed. ?  ?freestyle lancets ?TEST BLOOD GLUCOSE ONCE DAILY ?  ?ipratropium-albuterol 0.5-2.5 (3) MG/3ML Soln ?Commonly known as: DUONEB ?Take 3 mLs by nebulization every 6 (six) hours as needed (For wheezing). ?  ?Jardiance 25 MG Tabs tablet ?Generic drug: empagliflozin ?Take 25 mg by mouth daily. ?  ?magic mouthwash Soln ?Take 15 mLs by mouth 4 (four) times daily as needed for mouth pain. ?  ?  magnesium oxide 400 (240 Mg) MG tablet ?Commonly known as: MAG-OX ?Take 1 tablet by mouth daily. ?  ?multivitamin tablet ?Take 1 tablet by mouth daily. ?  ?nicotine 21 mg/24hr patch ?Commonly known as: NICODERM CQ - dosed in mg/24 hours ?Place 1 patch (21 mg total) onto the skin daily. ?Start taking on: April 25, 2021 ?  ?omeprazole 20 MG capsule ?Commonly known as: PRILOSEC ?Take 20 mg by mouth daily. ?  ?polyethylene glycol 17 g packet ?Commonly known as: MIRALAX / GLYCOLAX ?Take 17 g by mouth daily as needed for mild constipation. ?  ?potassium chloride SA 20 MEQ tablet ?Commonly known as: KLOR-CON  M ?Take 20 mEq by mouth daily. ?  ?predniSONE 10 MG tablet ?Commonly known as: DELTASONE ?Take 6 tablets (60 mg total) by mouth daily with breakfast. For 3 days, followed by decreasing 1 tablet or 10 mg eve

## 2021-04-24 NOTE — TOC Progression Note (Signed)
Transition of Care (TOC) - Progression Note  ? ? ?Patient Details  ?Name: Beth Stanley ?MRN: [id number] ?Date of Birth: 08-25-1950 ? ?Transition of Care (TOC) CM/SW Contact  ?Anselm Pancoast, RN ?Phone Number: ?04/24/2021, 2:47 PM ? ?Clinical Narrative:    ?Confirmed O2 has been ordered and is being processed by [employer]. Patient has tank to get home but needs confirmation that concentrator will arrive to home tonight. Sent request to [facility] [employer] and will update nursing team once confirmation received.  ? ? ?  ?  ? ?Expected Discharge Plan and Services ?  ?  ?  ?  ?  ?Expected Discharge Date: 04/24/21               ?  ?  ?  ?  ?  ?  ?  ?  ?  ?  ? ? ?Social Determinants of Health (SDOH) Interventions ?  ? ?Readmission Risk Interventions ?   ? View : No data to display.  ?  ?  ?  ? ? ?

## 2021-04-24 NOTE — TOC Transition Note (Addendum)
Transition of Care (TOC) - CM/SW Discharge Note ? ? ?Patient Details  ?Name: Beth Stanley ?MRN: [id number] ?Date of Birth: 02-19-1950 ? ?Transition of Care (TOC) CM/SW Contact:  ?Kerin Salen, RN ?Phone Number: ?04/24/2021, 2:50 PM ? ?[facility] to provide PT/OT however unable to start until Monday or Tuesday, Attending and Nurse notified. ? ? ? ?Final next level of care: Home/Self Care ?Barriers to Discharge: Barriers Resolved ? ? ?Patient Goals and CMS Choice ?  ?  ?  ? ?Discharge Placement ?  ? ? ?Discharge Plan and Services ?  ?  ?           ?DME Arranged: Oxygen ?DME Agency: [employer] ?Date DME Agency Contacted: 04/24/21 ?Time DME Agency Contacted: [phone] ?Representative spoke with at DME Agency: Suanne Marker ?  ?  ?  ?  ?  ? ?Social Determinants of Health (SDOH) Interventions ?  ? ? ?Readmission Risk Interventions ?   ? View : No data to display.  ?  ?  ?  ? ? ? ? ? ?

## 2021-04-24 NOTE — Progress Notes (Signed)
Home concentrator and portable oxygen tank had arrived. I showed husband the [facility] wall and car adapter power cords for the electronic oxygen concentrator. Showed him the location and how to plug the adapter into the concentrator. Showed them operation of the portable oxygen tank. Assisted with packaging their belongings and assisted them to their vehicle. Attempted to load vehicle in safest way possible. Ensured the concentrator was operational and flowing. PT's AVS had been explained and all PIVs removed. ?

## 2021-04-25 NOTE — TOC Transition Note (Signed)
Transition of Care (TOC) - CM/SW Discharge Note ? ? ?Patient Details  ?Name: Beth Stanley ?MRN: [id number] ?Date of Birth: July 28, 1950 ? ?Transition of Care (TOC) CM/SW Contact:  ?Anselm Pancoast, RN ?Phone Number: ?04/25/2021, 4:11 PM ? ? ?Clinical Narrative:    ?Confirmed with Donnetta Hutching, RN CM verbal order given for home health PT/OT  to [place]. Confirmed [facility] will follow up Monday/Tuesday with patient. MD aware.  ? ? ?Final next level of care: Home/Self Care ?Barriers to Discharge: Barriers Resolved ? ? ?Patient Goals and CMS Choice ?  ?  ?  ? ?Discharge Placement ?  ?           ?  ?  ?  ?Patient and family notified of of transfer: 04/24/21 ? ?Discharge Plan and Services ?  ?  ?           ?DME Arranged: Oxygen ?DME Agency: [employer] ?Date DME Agency Contacted: 04/24/21 ?Time DME Agency Contacted: [phone] ?Representative spoke with at DME Agency: Suanne Marker ?  ?  ?  ?  ?  ? ?Social Determinants of Health (SDOH) Interventions ?  ? ? ?Readmission Risk Interventions ?   ? View : No data to display.  ?  ?  ?  ? ? ? ? ? ?

## 2021-04-26 DIAGNOSIS — J449 Chronic obstructive pulmonary disease, unspecified: Secondary | ICD-10-CM | POA: Diagnosis not present

## 2021-04-26 DIAGNOSIS — J189 Pneumonia, unspecified organism: Secondary | ICD-10-CM | POA: Diagnosis not present

## 2021-04-26 DIAGNOSIS — Z9981 Dependence on supplemental oxygen: Secondary | ICD-10-CM | POA: Diagnosis not present

## 2021-04-29 DIAGNOSIS — J432 Centrilobular emphysema: Secondary | ICD-10-CM | POA: Diagnosis not present

## 2021-04-29 DIAGNOSIS — Z9484 Stem cells transplant status: Secondary | ICD-10-CM | POA: Diagnosis not present

## 2021-04-29 DIAGNOSIS — F32A Depression, unspecified: Secondary | ICD-10-CM | POA: Diagnosis not present

## 2021-04-29 DIAGNOSIS — F17210 Nicotine dependence, cigarettes, uncomplicated: Secondary | ICD-10-CM | POA: Diagnosis not present

## 2021-04-29 DIAGNOSIS — M6281 Muscle weakness (generalized): Secondary | ICD-10-CM | POA: Diagnosis not present

## 2021-04-29 DIAGNOSIS — E119 Type 2 diabetes mellitus without complications: Secondary | ICD-10-CM | POA: Diagnosis not present

## 2021-04-29 DIAGNOSIS — I10 Essential (primary) hypertension: Secondary | ICD-10-CM | POA: Diagnosis not present

## 2021-04-29 DIAGNOSIS — Z8616 Personal history of COVID-19: Secondary | ICD-10-CM | POA: Diagnosis not present

## 2021-04-29 DIAGNOSIS — J188 Other pneumonia, unspecified organism: Secondary | ICD-10-CM | POA: Diagnosis not present

## 2021-04-29 DIAGNOSIS — Z8571 Personal history of Hodgkin lymphoma: Secondary | ICD-10-CM | POA: Diagnosis not present

## 2021-04-30 DIAGNOSIS — E559 Vitamin D deficiency, unspecified: Secondary | ICD-10-CM | POA: Diagnosis not present

## 2021-04-30 DIAGNOSIS — J449 Chronic obstructive pulmonary disease, unspecified: Secondary | ICD-10-CM | POA: Diagnosis not present

## 2021-04-30 DIAGNOSIS — D229 Melanocytic nevi, unspecified: Secondary | ICD-10-CM | POA: Diagnosis not present

## 2021-04-30 DIAGNOSIS — Z09 Encounter for follow-up examination after completed treatment for conditions other than malignant neoplasm: Secondary | ICD-10-CM | POA: Diagnosis not present

## 2021-04-30 DIAGNOSIS — R748 Abnormal levels of other serum enzymes: Secondary | ICD-10-CM | POA: Diagnosis not present

## 2021-04-30 DIAGNOSIS — Z6822 Body mass index (BMI) 22.0-22.9, adult: Secondary | ICD-10-CM | POA: Diagnosis not present

## 2021-04-30 DIAGNOSIS — L821 Other seborrheic keratosis: Secondary | ICD-10-CM | POA: Diagnosis not present

## 2021-04-30 DIAGNOSIS — Z720 Tobacco use: Secondary | ICD-10-CM | POA: Diagnosis not present

## 2021-04-30 DIAGNOSIS — E119 Type 2 diabetes mellitus without complications: Secondary | ICD-10-CM | POA: Diagnosis not present

## 2021-05-05 DIAGNOSIS — Z79899 Other long term (current) drug therapy: Secondary | ICD-10-CM | POA: Diagnosis not present

## 2021-05-05 DIAGNOSIS — F17210 Nicotine dependence, cigarettes, uncomplicated: Secondary | ICD-10-CM | POA: Diagnosis not present

## 2021-05-05 DIAGNOSIS — Z6822 Body mass index (BMI) 22.0-22.9, adult: Secondary | ICD-10-CM | POA: Diagnosis not present

## 2021-05-05 DIAGNOSIS — C8590 Non-Hodgkin lymphoma, unspecified, unspecified site: Secondary | ICD-10-CM | POA: Diagnosis not present

## 2021-05-05 DIAGNOSIS — Z9181 History of falling: Secondary | ICD-10-CM | POA: Diagnosis not present

## 2021-05-05 DIAGNOSIS — E119 Type 2 diabetes mellitus without complications: Secondary | ICD-10-CM | POA: Diagnosis not present

## 2021-05-05 DIAGNOSIS — E559 Vitamin D deficiency, unspecified: Secondary | ICD-10-CM | POA: Diagnosis not present

## 2021-05-05 DIAGNOSIS — F1120 Opioid dependence, uncomplicated: Secondary | ICD-10-CM | POA: Diagnosis not present

## 2021-05-07 DIAGNOSIS — F1120 Opioid dependence, uncomplicated: Secondary | ICD-10-CM | POA: Diagnosis not present

## 2021-05-08 DIAGNOSIS — J45901 Unspecified asthma with (acute) exacerbation: Secondary | ICD-10-CM | POA: Diagnosis not present

## 2021-05-10 DIAGNOSIS — J189 Pneumonia, unspecified organism: Secondary | ICD-10-CM | POA: Diagnosis not present

## 2021-05-10 DIAGNOSIS — J432 Centrilobular emphysema: Secondary | ICD-10-CM | POA: Diagnosis not present

## 2021-05-16 DIAGNOSIS — E876 Hypokalemia: Secondary | ICD-10-CM | POA: Diagnosis not present

## 2021-05-16 DIAGNOSIS — B999 Unspecified infectious disease: Secondary | ICD-10-CM | POA: Diagnosis not present

## 2021-05-16 DIAGNOSIS — C8331 Diffuse large B-cell lymphoma, lymph nodes of head, face, and neck: Secondary | ICD-10-CM | POA: Diagnosis not present

## 2021-05-17 DIAGNOSIS — Z8616 Personal history of COVID-19: Secondary | ICD-10-CM | POA: Diagnosis not present

## 2021-05-17 DIAGNOSIS — J432 Centrilobular emphysema: Secondary | ICD-10-CM | POA: Diagnosis not present

## 2021-05-17 DIAGNOSIS — I10 Essential (primary) hypertension: Secondary | ICD-10-CM | POA: Diagnosis not present

## 2021-05-17 DIAGNOSIS — F17210 Nicotine dependence, cigarettes, uncomplicated: Secondary | ICD-10-CM | POA: Diagnosis not present

## 2021-05-17 DIAGNOSIS — F32A Depression, unspecified: Secondary | ICD-10-CM | POA: Diagnosis not present

## 2021-05-17 DIAGNOSIS — E119 Type 2 diabetes mellitus without complications: Secondary | ICD-10-CM | POA: Diagnosis not present

## 2021-05-17 DIAGNOSIS — Z8571 Personal history of Hodgkin lymphoma: Secondary | ICD-10-CM | POA: Diagnosis not present

## 2021-05-17 DIAGNOSIS — Z9484 Stem cells transplant status: Secondary | ICD-10-CM | POA: Diagnosis not present

## 2021-05-17 DIAGNOSIS — M6281 Muscle weakness (generalized): Secondary | ICD-10-CM | POA: Diagnosis not present

## 2021-05-17 DIAGNOSIS — J188 Other pneumonia, unspecified organism: Secondary | ICD-10-CM | POA: Diagnosis not present

## 2021-05-24 DIAGNOSIS — C8590 Non-Hodgkin lymphoma, unspecified, unspecified site: Secondary | ICD-10-CM | POA: Diagnosis not present

## 2021-05-24 DIAGNOSIS — M5021 Other cervical disc displacement,  high cervical region: Secondary | ICD-10-CM | POA: Diagnosis not present

## 2021-05-24 DIAGNOSIS — M545 Low back pain, unspecified: Secondary | ICD-10-CM | POA: Diagnosis not present

## 2021-05-24 DIAGNOSIS — M5124 Other intervertebral disc displacement, thoracic region: Secondary | ICD-10-CM | POA: Diagnosis not present

## 2021-05-24 DIAGNOSIS — M4804 Spinal stenosis, thoracic region: Secondary | ICD-10-CM | POA: Diagnosis not present

## 2021-05-24 DIAGNOSIS — C8331 Diffuse large B-cell lymphoma, lymph nodes of head, face, and neck: Secondary | ICD-10-CM | POA: Diagnosis not present

## 2021-05-24 DIAGNOSIS — M4316 Spondylolisthesis, lumbar region: Secondary | ICD-10-CM | POA: Diagnosis not present

## 2021-05-27 DIAGNOSIS — M542 Cervicalgia: Secondary | ICD-10-CM | POA: Diagnosis not present

## 2021-05-31 DIAGNOSIS — R220 Localized swelling, mass and lump, head: Secondary | ICD-10-CM | POA: Diagnosis not present

## 2021-05-31 DIAGNOSIS — R6884 Jaw pain: Secondary | ICD-10-CM | POA: Diagnosis not present

## 2021-05-31 DIAGNOSIS — J3489 Other specified disorders of nose and nasal sinuses: Secondary | ICD-10-CM | POA: Diagnosis not present

## 2021-05-31 DIAGNOSIS — C8331 Diffuse large B-cell lymphoma, lymph nodes of head, face, and neck: Secondary | ICD-10-CM | POA: Diagnosis not present

## 2021-06-06 DIAGNOSIS — N952 Postmenopausal atrophic vaginitis: Secondary | ICD-10-CM | POA: Diagnosis not present

## 2021-06-06 DIAGNOSIS — N3946 Mixed incontinence: Secondary | ICD-10-CM | POA: Diagnosis not present

## 2021-06-06 DIAGNOSIS — R399 Unspecified symptoms and signs involving the genitourinary system: Secondary | ICD-10-CM | POA: Diagnosis not present

## 2021-06-06 DIAGNOSIS — N390 Urinary tract infection, site not specified: Secondary | ICD-10-CM | POA: Diagnosis not present

## 2021-06-07 DIAGNOSIS — J45901 Unspecified asthma with (acute) exacerbation: Secondary | ICD-10-CM | POA: Diagnosis not present

## 2021-06-09 DIAGNOSIS — J189 Pneumonia, unspecified organism: Secondary | ICD-10-CM | POA: Diagnosis not present

## 2021-06-09 DIAGNOSIS — J432 Centrilobular emphysema: Secondary | ICD-10-CM | POA: Diagnosis not present

## 2021-06-11 DIAGNOSIS — E559 Vitamin D deficiency, unspecified: Secondary | ICD-10-CM | POA: Diagnosis not present

## 2021-06-11 DIAGNOSIS — Z79899 Other long term (current) drug therapy: Secondary | ICD-10-CM | POA: Diagnosis not present

## 2021-06-11 DIAGNOSIS — Z9181 History of falling: Secondary | ICD-10-CM | POA: Diagnosis not present

## 2021-06-11 DIAGNOSIS — C8590 Non-Hodgkin lymphoma, unspecified, unspecified site: Secondary | ICD-10-CM | POA: Diagnosis not present

## 2021-06-11 DIAGNOSIS — E119 Type 2 diabetes mellitus without complications: Secondary | ICD-10-CM | POA: Diagnosis not present

## 2021-06-11 DIAGNOSIS — F17210 Nicotine dependence, cigarettes, uncomplicated: Secondary | ICD-10-CM | POA: Diagnosis not present

## 2021-06-11 DIAGNOSIS — F1120 Opioid dependence, uncomplicated: Secondary | ICD-10-CM | POA: Diagnosis not present

## 2021-06-11 DIAGNOSIS — Z6822 Body mass index (BMI) 22.0-22.9, adult: Secondary | ICD-10-CM | POA: Diagnosis not present

## 2021-06-21 DIAGNOSIS — D692 Other nonthrombocytopenic purpura: Secondary | ICD-10-CM | POA: Diagnosis not present

## 2021-06-21 DIAGNOSIS — Z923 Personal history of irradiation: Secondary | ICD-10-CM | POA: Diagnosis not present

## 2021-06-21 DIAGNOSIS — Z9221 Personal history of antineoplastic chemotherapy: Secondary | ICD-10-CM | POA: Diagnosis not present

## 2021-06-21 DIAGNOSIS — E119 Type 2 diabetes mellitus without complications: Secondary | ICD-10-CM | POA: Diagnosis not present

## 2021-06-21 DIAGNOSIS — F3342 Major depressive disorder, recurrent, in full remission: Secondary | ICD-10-CM | POA: Diagnosis not present

## 2021-06-21 DIAGNOSIS — I10 Essential (primary) hypertension: Secondary | ICD-10-CM | POA: Diagnosis not present

## 2021-06-21 DIAGNOSIS — C8330 Diffuse large B-cell lymphoma, unspecified site: Secondary | ICD-10-CM | POA: Diagnosis not present

## 2021-06-21 DIAGNOSIS — Z7984 Long term (current) use of oral hypoglycemic drugs: Secondary | ICD-10-CM | POA: Diagnosis not present

## 2021-06-21 DIAGNOSIS — J449 Chronic obstructive pulmonary disease, unspecified: Secondary | ICD-10-CM | POA: Diagnosis not present

## 2021-07-01 DIAGNOSIS — E559 Vitamin D deficiency, unspecified: Secondary | ICD-10-CM | POA: Diagnosis not present

## 2021-07-01 DIAGNOSIS — F17210 Nicotine dependence, cigarettes, uncomplicated: Secondary | ICD-10-CM | POA: Diagnosis not present

## 2021-07-01 DIAGNOSIS — F1120 Opioid dependence, uncomplicated: Secondary | ICD-10-CM | POA: Diagnosis not present

## 2021-07-01 DIAGNOSIS — C8590 Non-Hodgkin lymphoma, unspecified, unspecified site: Secondary | ICD-10-CM | POA: Diagnosis not present

## 2021-07-01 DIAGNOSIS — E119 Type 2 diabetes mellitus without complications: Secondary | ICD-10-CM | POA: Diagnosis not present

## 2021-07-01 DIAGNOSIS — Z79899 Other long term (current) drug therapy: Secondary | ICD-10-CM | POA: Diagnosis not present

## 2021-07-01 DIAGNOSIS — Z9181 History of falling: Secondary | ICD-10-CM | POA: Diagnosis not present

## 2021-07-01 DIAGNOSIS — Z6822 Body mass index (BMI) 22.0-22.9, adult: Secondary | ICD-10-CM | POA: Diagnosis not present

## 2021-07-08 DIAGNOSIS — J45901 Unspecified asthma with (acute) exacerbation: Secondary | ICD-10-CM | POA: Diagnosis not present

## 2021-07-10 DIAGNOSIS — J432 Centrilobular emphysema: Secondary | ICD-10-CM | POA: Diagnosis not present

## 2021-07-10 DIAGNOSIS — J189 Pneumonia, unspecified organism: Secondary | ICD-10-CM | POA: Diagnosis not present

## 2021-07-22 ENCOUNTER — Encounter: Payer: Self-pay | Admitting: Neurology

## 2021-07-22 ENCOUNTER — Ambulatory Visit (INDEPENDENT_AMBULATORY_CARE_PROVIDER_SITE_OTHER): Payer: PPO | Admitting: Neurology

## 2021-07-22 VITALS — BP 115/70 | HR 81 | Ht 63.0 in | Wt 137.5 lb

## 2021-07-22 DIAGNOSIS — C8590 Non-Hodgkin lymphoma, unspecified, unspecified site: Secondary | ICD-10-CM | POA: Diagnosis not present

## 2021-07-22 DIAGNOSIS — R269 Unspecified abnormalities of gait and mobility: Secondary | ICD-10-CM | POA: Diagnosis not present

## 2021-07-22 DIAGNOSIS — C8290 Follicular lymphoma, unspecified, unspecified site: Secondary | ICD-10-CM | POA: Insufficient documentation

## 2021-07-22 NOTE — Progress Notes (Signed)
Chief Complaint  Patient presents with   New Patient (Initial Visit)    Rm 15. Accompanied by husband. NX [facility]/Mark Dumonski MD/leg pain and neurogenic claudication.      ASSESSMENT AND PLAN  Beth Stanley is a 71 y.o. female    Gait abnormalities  Multifactorial, including low low back pain, deconditioning, decreased lung capacity, shortness of breath with exertion, hyperreflexia on examination, with bilateral Babinski signs, can be related to her previous left T2-3 mass, post C7-T4 radiation treatment, also had a history of left femur region non-Hodgkin's lymphoma involvement, complains of left thigh area deep achy pain  Continue moderate exercise  Return to clinic for new issues   DIAGNOSTIC DATA (LABS, IMAGING, TESTING) - I reviewed patient records, labs, notes, testing and imaging myself where available.  MRI of the brain with without contrast November 2022, no acute abnormality, generalized atrophy, small vessel disease  MRI of cervical spine, multilevel degenerative changes no acute abnormality  MRI of thoracic spine, paraspinal mass on the left at T2-3, involving left half of T2 vertebral body and the left pedicle, diffusely contrast-enhancing, no significant canal stenosis  MRI lumbar, mild degenerative changes, most noticeable at L4-5, L5-S1, with mild spinal canal stenosis, mild left foraminal stenosis   PAST MEDICAL HISTORY:  Beth Stanley, is a 71 year old female, accompanied by her husband seen in request by her orthopedic surgeon Dr. Lynann Bologna, [place] for evaluation of gait abnormality, left lower extremity deep achy pain, her primary care is nurse practitioner in the [place], Regina F, showing evaluation was on July 22, 2021  I reviewed and summarized the referring note. PMHx DM- since 2018 HTN Depression, B-cell lymphoma  Patient was diagnosed with stage IV non-Hodgkin's B-cell lymphoma with lesion involving ribs, distal left femur,  and bone thyroid involvement, she was treated with chemotherapy, PET scan staging was negative in 2005  In 2018 PET scan showed numerous focal osseous PTG uptake throughout the axial and appendicular skeleton, involving rectum, rectosigmoid region, biopsy of left ilium confirmed low-grade B-cell lymphoma with focal large cell transformation, she underwent current chemotherapy with good response, then autologous stem cell transplant, in 04/2016, followed by chemotherapy, radiation therapy,  Also had  hospital admission in April 2023 for multifocal pneumonia, was treated with antibiotic, steroid, oxygen supplement, also had a COPD exacerbation, she was a smoker in the past  Over the past few years, with multiple treatment, she had gradual worsening gait abnormality, tends to lean forward, also from complaints of left upper thigh area deep aching pain, urinary incontinence  Received radiation therapy from C7-T4 in October 2022 due to left paravertebralmass at T2-3,  She had progressive worsening gait abnormality over the past few months, but 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  PHYSICAL EXAMNIATION:  Gen: NAD, conversant, well nourised, well groomed                     Cardiovascular: Regular rate rhythm, no peripheral edema, warm, nontender. Eyes: Conjunctivae clear without exudates or hemorrhage Neck: Supple, no carotid bruits. Pulmonary: Clear to auscultation bilaterally   NEUROLOGICAL EXAM:  MENTAL STATUS: Speech/cognition: Awake, alert, oriented to history taking and casual conversation CRANIAL NERVES: CN II: Visual fields are  full to confrontation. Pupils are round equal and briskly reactive to light. CN III, IV, VI: extraocular movement are normal. No ptosis. CN V: Facial sensation  is intact to light touch CN VII: Face is symmetric with normal eye closure  CN VIII: Hearing is normal to causal conversation. CN IX, X: Phonation is normal. CN XI: Head turning and shoulder shrug are intact  MOTOR: Left upper extremity strength is limited by left shoulder pain, felt there was no significant bilateral upper extremity proximal and distal muscle weakness  Mild bilateral hip flexion weakness, no significant distal weakness  REFLEXES: Reflexes are 2+ and symmetric at the biceps, triceps, knees, and ankles. Plantar responses are extensor bilaterally  SENSORY: Intact to light touch, pinprick and vibratory sensation are intact in fingers and toes.  COORDINATION: There is no trunk or limb dysmetria noted.  GAIT/STANCE: She needs push-up to get up from seated position, stiff, mildly unsteady, limited range of motion of neck  REVIEW OF SYSTEMS:  Full 14 system review of systems performed and notable only for as above All other review of systems were negative.   ALLERGIES: Allergies  Allergen Reactions   Aspirin     Stomach cramp   Latex Swelling   Other    Tape Rash    Makes Whelps on Skin Makes Whelps on Skin    HOME MEDICATIONS: Current Outpatient Medications  Medication Sig Dispense Refill   acetaminophen (TYLENOL) 500 MG tablet Take 1,000 mg by mouth every 6 (six) hours as needed.     albuterol (VENTOLIN HFA) 108 (90 Base) MCG/ACT inhaler Inhale 1 puff into the lungs every 6 (six) hours as needed for wheezing.     Buprenorphine HCl-Naloxone HCl 8-2 MG FILM Place 1 strip under the tongue 3 (three) times daily.     carboxymethylcellulose (REFRESH PLUS) 0.5 % SOLN Place 1 drop into the left eye 2 (two) times daily.     Cholecalciferol 25 MCG (1000 UT) tablet Take 1 tablet by mouth daily.     cyanocobalamin 1000 MCG tablet Take 1 tablet by mouth daily.     dextromethorphan-guaiFENesin (MUCINEX DM) 30-600 MG 12hr tablet Take 1 tablet by mouth 2 (two) times daily.  30 tablet 0   diclofenac Sodium (VOLTAREN) 1 % GEL Apply 4 g topically 4 (four) times daily.     ferrous sulfate 325 (65 FE) MG tablet Take 325 mg by mouth daily.     fluticasone-salmeterol (ADVAIR) 250-50 MCG/ACT AEPB Inhale 1 puff into the lungs 2 (two) times daily as needed.     ipratropium-albuterol (DUONEB) 0.5-2.5 (3) MG/3ML SOLN Take 3 mLs by nebulization every 6 (six) hours as needed (For wheezing). 360 mL 0   JARDIANCE 25 MG TABS tablet Take 25 mg by mouth daily.     Lancets (FREESTYLE) lancets TEST BLOOD GLUCOSE ONCE DAILY 100 each 12   magic mouthwash SOLN Take 15 mLs by mouth 4 (four) times daily as needed for mouth pain.     magnesium oxide (MAG-OX) 400 (240 Mg) MG tablet Take 1 tablet by mouth daily.     nicotine (NICODERM CQ - DOSED IN MG/24 HOURS) 21 mg/24hr patch Place 1 patch (21 mg total) onto the skin daily. 28 patch 0   omeprazole (PRILOSEC) 20 MG capsule Take 20 mg by mouth daily.     polyethylene glycol (MIRALAX / GLYCOLAX) 17 g packet Take 17 g by mouth daily as needed for mild constipation. 14 each 0   potassium chloride SA (KLOR-CON) 20  MEQ tablet Take 20 mEq by mouth daily.     predniSONE (DELTASONE) 10 MG tablet Take 6 tablets (60 mg total) by mouth daily with breakfast. For 3 days, followed by decreasing 1 tablet or 10 mg every 5-day until you finish all the medicine. 90 tablet 0   ramipril (ALTACE) 2.5 MG capsule Take 1 capsule (2.5 mg total) by mouth daily. 90 capsule 3   sertraline (ZOLOFT) 100 MG tablet TAKE ONE (1) TABLET EACH DAY (Patient taking differently: Take 150 mg by mouth daily.) 90 tablet 3   TRELEGY ELLIPTA 100-62.5-25 MCG/ACT AEPB Inhale 1 puff into the lungs daily.     valACYclovir (VALTREX) 1000 MG tablet Take 1,000 mg by mouth 2 (two) times daily.     No current facility-administered medications for this visit.    PAST MEDICAL HISTORY: Past Medical History:  Diagnosis Date   Anxiety    Arthritis    C. difficile colitis 10/01/2019   COVID-19  virus infection 09/29/2019   Diabetes ([place])    Heartburn    History of stem cell transplant ([place])    HTN (hypertension)    Non Hodgkin's lymphoma ([place]) 2004   Hx chemo tx's.     PAST SURGICAL HISTORY: Past Surgical History:  Procedure Laterality Date   TOTAL ABDOMINAL HYSTERECTOMY      FAMILY HISTORY: Family History  Problem Relation Age of Onset   Diabetes Mother    Lung cancer Sister    Lung cancer Brother    Kidney cancer Brother        removed kidney    Lung cancer Brother    Prostate cancer Neg Hx    Bladder Cancer Neg Hx     SOCIAL HISTORY: Social History   Socioeconomic History   Marital status: Married    Spouse name: Herbie [place]    Number of children: 1   Years of education: 12   Highest education level: Not on file  Occupational History   Not on file  Tobacco Use   Smoking status: Every Day    Packs/day: 1.00    Types: Cigarettes   Smokeless tobacco: Never  Substance and Sexual Activity   Alcohol use: Not Currently    Alcohol/week: 0.0 standard drinks of alcohol    Comment: Wine ocass   Drug use: No   Sexual activity: Not on file  Other Topics Concern   Not on file  Social History Narrative   Lives w/ husband   Caffeine use: Drinks 6 cups per day   Right-handed   Social Determinants of Health   Financial Resource Strain: Not on file  Food Insecurity: Not on file  Transportation Needs: Not on file  Physical Activity: Not on file  Stress: Not on file  Social Connections: Not on file  Intimate Partner Violence: Not on file   Total time spent reviewing the chart, obtaining history, examined patient, ordering tests, documentation, consultations and family, care coordination was  74 minutes   Marcial Pacas, M.D. Ph.D.  [facility] [address] [place], [place] [address] Ph: [phone] Fax: [phone]  CC:  Phylliss Bob, MD [address] [place],  [place] [address]  Imagene Riches, NP

## 2021-08-05 DIAGNOSIS — Z6822 Body mass index (BMI) 22.0-22.9, adult: Secondary | ICD-10-CM | POA: Diagnosis not present

## 2021-08-05 DIAGNOSIS — Z79899 Other long term (current) drug therapy: Secondary | ICD-10-CM | POA: Diagnosis not present

## 2021-08-05 DIAGNOSIS — F1120 Opioid dependence, uncomplicated: Secondary | ICD-10-CM | POA: Diagnosis not present

## 2021-08-05 DIAGNOSIS — C8590 Non-Hodgkin lymphoma, unspecified, unspecified site: Secondary | ICD-10-CM | POA: Diagnosis not present

## 2021-08-05 DIAGNOSIS — E119 Type 2 diabetes mellitus without complications: Secondary | ICD-10-CM | POA: Diagnosis not present

## 2021-08-05 DIAGNOSIS — Z9181 History of falling: Secondary | ICD-10-CM | POA: Diagnosis not present

## 2021-08-05 DIAGNOSIS — E559 Vitamin D deficiency, unspecified: Secondary | ICD-10-CM | POA: Diagnosis not present

## 2021-08-05 DIAGNOSIS — F17210 Nicotine dependence, cigarettes, uncomplicated: Secondary | ICD-10-CM | POA: Diagnosis not present

## 2021-08-07 DIAGNOSIS — F1120 Opioid dependence, uncomplicated: Secondary | ICD-10-CM | POA: Diagnosis not present

## 2021-08-07 DIAGNOSIS — J45901 Unspecified asthma with (acute) exacerbation: Secondary | ICD-10-CM | POA: Diagnosis not present

## 2021-08-08 DIAGNOSIS — N3281 Overactive bladder: Secondary | ICD-10-CM | POA: Diagnosis not present

## 2021-08-08 DIAGNOSIS — Z87440 Personal history of urinary (tract) infections: Secondary | ICD-10-CM | POA: Diagnosis not present

## 2021-08-08 DIAGNOSIS — Z87448 Personal history of other diseases of urinary system: Secondary | ICD-10-CM | POA: Diagnosis not present

## 2021-08-09 DIAGNOSIS — J189 Pneumonia, unspecified organism: Secondary | ICD-10-CM | POA: Diagnosis not present

## 2021-08-09 DIAGNOSIS — J432 Centrilobular emphysema: Secondary | ICD-10-CM | POA: Diagnosis not present

## 2021-08-14 DIAGNOSIS — Z1231 Encounter for screening mammogram for malignant neoplasm of breast: Secondary | ICD-10-CM | POA: Diagnosis not present

## 2021-08-26 DIAGNOSIS — M4800 Spinal stenosis, site unspecified: Secondary | ICD-10-CM | POA: Diagnosis not present

## 2021-08-26 DIAGNOSIS — G8929 Other chronic pain: Secondary | ICD-10-CM | POA: Diagnosis not present

## 2021-08-26 DIAGNOSIS — M5489 Other dorsalgia: Secondary | ICD-10-CM | POA: Diagnosis not present

## 2021-08-26 DIAGNOSIS — Z79899 Other long term (current) drug therapy: Secondary | ICD-10-CM | POA: Diagnosis not present

## 2021-08-26 DIAGNOSIS — R413 Other amnesia: Secondary | ICD-10-CM | POA: Diagnosis not present

## 2021-08-26 DIAGNOSIS — C8298 Follicular lymphoma, unspecified, lymph nodes of multiple sites: Secondary | ICD-10-CM | POA: Diagnosis not present

## 2021-08-26 DIAGNOSIS — R2689 Other abnormalities of gait and mobility: Secondary | ICD-10-CM | POA: Diagnosis not present

## 2021-08-26 DIAGNOSIS — R269 Unspecified abnormalities of gait and mobility: Secondary | ICD-10-CM | POA: Diagnosis not present

## 2021-08-26 DIAGNOSIS — R5383 Other fatigue: Secondary | ICD-10-CM | POA: Diagnosis not present

## 2021-08-26 DIAGNOSIS — M549 Dorsalgia, unspecified: Secondary | ICD-10-CM | POA: Diagnosis not present

## 2021-08-26 DIAGNOSIS — C8331 Diffuse large B-cell lymphoma, lymph nodes of head, face, and neck: Secondary | ICD-10-CM | POA: Diagnosis not present

## 2021-08-26 DIAGNOSIS — Z9484 Stem cells transplant status: Secondary | ICD-10-CM | POA: Diagnosis not present

## 2021-08-28 ENCOUNTER — Other Ambulatory Visit: Payer: Self-pay | Admitting: *Deleted

## 2021-08-28 NOTE — Patient Outreach (Signed)
  Care Coordination   08/28/2021 Name: MAIKA MCELVEEN MRN: [id number] DOB: March 18, 1950   Care Coordination Outreach Attempts:  An unsuccessful telephone outreach was attempted today to offer the patient information about available care coordination services as a benefit of their health plan.   Follow Up Plan:  Additional outreach attempts will be made to offer the patient care coordination information and services.   Encounter Outcome:  No Answer  Care Coordination Interventions Activated:  No   Care Coordination Interventions:  No, not indicated    Terrian Sentell C. Myrtie Neither, MSN, [facility] Gerontological Nurse Practitioner [facility] Care Management [phone]

## 2021-09-07 DIAGNOSIS — J45901 Unspecified asthma with (acute) exacerbation: Secondary | ICD-10-CM | POA: Diagnosis not present

## 2021-09-09 DIAGNOSIS — L720 Epidermal cyst: Secondary | ICD-10-CM | POA: Diagnosis not present

## 2021-09-09 DIAGNOSIS — E559 Vitamin D deficiency, unspecified: Secondary | ICD-10-CM | POA: Diagnosis not present

## 2021-09-09 DIAGNOSIS — C8590 Non-Hodgkin lymphoma, unspecified, unspecified site: Secondary | ICD-10-CM | POA: Diagnosis not present

## 2021-09-09 DIAGNOSIS — Z6824 Body mass index (BMI) 24.0-24.9, adult: Secondary | ICD-10-CM | POA: Diagnosis not present

## 2021-09-09 DIAGNOSIS — J449 Chronic obstructive pulmonary disease, unspecified: Secondary | ICD-10-CM | POA: Diagnosis not present

## 2021-09-09 DIAGNOSIS — Z79899 Other long term (current) drug therapy: Secondary | ICD-10-CM | POA: Diagnosis not present

## 2021-09-09 DIAGNOSIS — E119 Type 2 diabetes mellitus without complications: Secondary | ICD-10-CM | POA: Diagnosis not present

## 2021-09-09 DIAGNOSIS — F17210 Nicotine dependence, cigarettes, uncomplicated: Secondary | ICD-10-CM | POA: Diagnosis not present

## 2021-09-09 DIAGNOSIS — I10 Essential (primary) hypertension: Secondary | ICD-10-CM | POA: Diagnosis not present

## 2021-09-09 DIAGNOSIS — Z9181 History of falling: Secondary | ICD-10-CM | POA: Diagnosis not present

## 2021-09-09 DIAGNOSIS — J432 Centrilobular emphysema: Secondary | ICD-10-CM | POA: Diagnosis not present

## 2021-09-09 DIAGNOSIS — E876 Hypokalemia: Secondary | ICD-10-CM | POA: Diagnosis not present

## 2021-09-09 DIAGNOSIS — Z136 Encounter for screening for cardiovascular disorders: Secondary | ICD-10-CM | POA: Diagnosis not present

## 2021-09-09 DIAGNOSIS — Z13220 Encounter for screening for lipoid disorders: Secondary | ICD-10-CM | POA: Diagnosis not present

## 2021-09-09 DIAGNOSIS — F1120 Opioid dependence, uncomplicated: Secondary | ICD-10-CM | POA: Diagnosis not present

## 2021-09-09 DIAGNOSIS — J189 Pneumonia, unspecified organism: Secondary | ICD-10-CM | POA: Diagnosis not present

## 2021-09-09 DIAGNOSIS — Z0001 Encounter for general adult medical examination with abnormal findings: Secondary | ICD-10-CM | POA: Diagnosis not present

## 2021-09-09 DIAGNOSIS — Z6822 Body mass index (BMI) 22.0-22.9, adult: Secondary | ICD-10-CM | POA: Diagnosis not present

## 2021-09-12 DIAGNOSIS — F1120 Opioid dependence, uncomplicated: Secondary | ICD-10-CM | POA: Diagnosis not present

## 2021-09-13 ENCOUNTER — Other Ambulatory Visit: Payer: Self-pay | Admitting: *Deleted

## 2021-09-13 NOTE — Patient Outreach (Signed)
  Care Coordination   09/13/2021 Name: Beth Stanley MRN: [id number] DOB: February 15, 1950   Care Coordination Outreach Attempts:  A second unsuccessful outreach was attempted today to offer the patient with information about available care coordination services as a benefit of their health plan.    NP SENT TEXT WITH CONTACT INFO AND REQUEST TO RETURN CALL.  Follow Up Plan:  Additional outreach attempts will be made to offer the patient care coordination information and services.   Encounter Outcome:  No Answer  Care Coordination Interventions Activated:  No   Care Coordination Interventions:  No, not indicated    SIG Stellarose Cerny C. Myrtie Neither, MSN, [facility] Gerontological Nurse Practitioner [facility] Care Management [phone]

## 2021-09-27 ENCOUNTER — Telehealth: Payer: Self-pay | Admitting: *Deleted

## 2021-09-27 NOTE — Patient Outreach (Signed)
  Care Coordination   09/27/2021 Name: Beth Stanley MRN: [id number] DOB: October 02, 1950   Care Coordination Outreach Attempts:  A third unsuccessful outreach was attempted today to offer the patient with information about available care coordination services as a benefit of their health plan.   Follow Up Plan:  No further outreach attempts will be made at this time. We have been unable to contact the patient to offer or enroll patient in care coordination services  Encounter Outcome:  No Answer  Care Coordination Interventions Activated:  No   Care Coordination Interventions:  No, not indicated    SIG Hart Haas C. Myrtie Neither, MSN, [facility] Gerontological Nurse Practitioner [facility] Care Management [phone]

## 2021-10-07 DIAGNOSIS — F17210 Nicotine dependence, cigarettes, uncomplicated: Secondary | ICD-10-CM | POA: Diagnosis not present

## 2021-10-07 DIAGNOSIS — Z6822 Body mass index (BMI) 22.0-22.9, adult: Secondary | ICD-10-CM | POA: Diagnosis not present

## 2021-10-07 DIAGNOSIS — Z5320 Procedure and treatment not carried out because of patient's decision for unspecified reasons: Secondary | ICD-10-CM | POA: Diagnosis not present

## 2021-10-07 DIAGNOSIS — Z9181 History of falling: Secondary | ICD-10-CM | POA: Diagnosis not present

## 2021-10-07 DIAGNOSIS — Z79899 Other long term (current) drug therapy: Secondary | ICD-10-CM | POA: Diagnosis not present

## 2021-10-07 DIAGNOSIS — E559 Vitamin D deficiency, unspecified: Secondary | ICD-10-CM | POA: Diagnosis not present

## 2021-10-07 DIAGNOSIS — E119 Type 2 diabetes mellitus without complications: Secondary | ICD-10-CM | POA: Diagnosis not present

## 2021-10-07 DIAGNOSIS — F1120 Opioid dependence, uncomplicated: Secondary | ICD-10-CM | POA: Diagnosis not present

## 2021-10-07 DIAGNOSIS — C8590 Non-Hodgkin lymphoma, unspecified, unspecified site: Secondary | ICD-10-CM | POA: Diagnosis not present

## 2021-10-08 DIAGNOSIS — C8331 Diffuse large B-cell lymphoma, lymph nodes of head, face, and neck: Secondary | ICD-10-CM | POA: Diagnosis not present

## 2021-10-08 DIAGNOSIS — Z923 Personal history of irradiation: Secondary | ICD-10-CM | POA: Diagnosis not present

## 2021-10-08 DIAGNOSIS — J45901 Unspecified asthma with (acute) exacerbation: Secondary | ICD-10-CM | POA: Diagnosis not present

## 2021-10-09 DIAGNOSIS — F1120 Opioid dependence, uncomplicated: Secondary | ICD-10-CM | POA: Diagnosis not present

## 2021-10-10 DIAGNOSIS — J432 Centrilobular emphysema: Secondary | ICD-10-CM | POA: Diagnosis not present

## 2021-10-10 DIAGNOSIS — J189 Pneumonia, unspecified organism: Secondary | ICD-10-CM | POA: Diagnosis not present

## 2021-11-04 DIAGNOSIS — F17210 Nicotine dependence, cigarettes, uncomplicated: Secondary | ICD-10-CM | POA: Diagnosis not present

## 2021-11-04 DIAGNOSIS — Z6822 Body mass index (BMI) 22.0-22.9, adult: Secondary | ICD-10-CM | POA: Diagnosis not present

## 2021-11-04 DIAGNOSIS — E119 Type 2 diabetes mellitus without complications: Secondary | ICD-10-CM | POA: Diagnosis not present

## 2021-11-04 DIAGNOSIS — F1120 Opioid dependence, uncomplicated: Secondary | ICD-10-CM | POA: Diagnosis not present

## 2021-11-04 DIAGNOSIS — Z79899 Other long term (current) drug therapy: Secondary | ICD-10-CM | POA: Diagnosis not present

## 2021-11-04 DIAGNOSIS — C8590 Non-Hodgkin lymphoma, unspecified, unspecified site: Secondary | ICD-10-CM | POA: Diagnosis not present

## 2021-11-07 DIAGNOSIS — J45901 Unspecified asthma with (acute) exacerbation: Secondary | ICD-10-CM | POA: Diagnosis not present

## 2021-11-07 DIAGNOSIS — Z79899 Other long term (current) drug therapy: Secondary | ICD-10-CM | POA: Diagnosis not present

## 2021-11-09 DIAGNOSIS — J189 Pneumonia, unspecified organism: Secondary | ICD-10-CM | POA: Diagnosis not present

## 2021-11-09 DIAGNOSIS — J432 Centrilobular emphysema: Secondary | ICD-10-CM | POA: Diagnosis not present

## 2021-11-28 DIAGNOSIS — Z91040 Latex allergy status: Secondary | ICD-10-CM | POA: Diagnosis not present

## 2021-11-28 DIAGNOSIS — Z923 Personal history of irradiation: Secondary | ICD-10-CM | POA: Diagnosis not present

## 2021-11-28 DIAGNOSIS — Z9221 Personal history of antineoplastic chemotherapy: Secondary | ICD-10-CM | POA: Diagnosis not present

## 2021-11-28 DIAGNOSIS — M4726 Other spondylosis with radiculopathy, lumbar region: Secondary | ICD-10-CM | POA: Diagnosis not present

## 2021-11-28 DIAGNOSIS — F17210 Nicotine dependence, cigarettes, uncomplicated: Secondary | ICD-10-CM | POA: Diagnosis not present

## 2021-11-28 DIAGNOSIS — M9971 Connective tissue and disc stenosis of intervertebral foramina of cervical region: Secondary | ICD-10-CM | POA: Diagnosis not present

## 2021-11-28 DIAGNOSIS — M4312 Spondylolisthesis, cervical region: Secondary | ICD-10-CM | POA: Diagnosis not present

## 2021-11-28 DIAGNOSIS — E119 Type 2 diabetes mellitus without complications: Secondary | ICD-10-CM | POA: Diagnosis not present

## 2021-11-28 DIAGNOSIS — C8331 Diffuse large B-cell lymphoma, lymph nodes of head, face, and neck: Secondary | ICD-10-CM | POA: Diagnosis not present

## 2021-11-28 DIAGNOSIS — M4722 Other spondylosis with radiculopathy, cervical region: Secondary | ICD-10-CM | POA: Diagnosis not present

## 2021-12-04 DIAGNOSIS — H2511 Age-related nuclear cataract, right eye: Secondary | ICD-10-CM | POA: Diagnosis not present

## 2021-12-04 DIAGNOSIS — E119 Type 2 diabetes mellitus without complications: Secondary | ICD-10-CM | POA: Diagnosis not present

## 2021-12-04 DIAGNOSIS — D23122 Other benign neoplasm of skin of left lower eyelid, including canthus: Secondary | ICD-10-CM | POA: Diagnosis not present

## 2021-12-04 DIAGNOSIS — H5203 Hypermetropia, bilateral: Secondary | ICD-10-CM | POA: Diagnosis not present

## 2021-12-08 DIAGNOSIS — J45901 Unspecified asthma with (acute) exacerbation: Secondary | ICD-10-CM | POA: Diagnosis not present

## 2021-12-09 DIAGNOSIS — F1120 Opioid dependence, uncomplicated: Secondary | ICD-10-CM | POA: Diagnosis not present

## 2021-12-09 DIAGNOSIS — Z6823 Body mass index (BMI) 23.0-23.9, adult: Secondary | ICD-10-CM | POA: Diagnosis not present

## 2021-12-09 DIAGNOSIS — C8590 Non-Hodgkin lymphoma, unspecified, unspecified site: Secondary | ICD-10-CM | POA: Diagnosis not present

## 2021-12-09 DIAGNOSIS — E119 Type 2 diabetes mellitus without complications: Secondary | ICD-10-CM | POA: Diagnosis not present

## 2021-12-09 DIAGNOSIS — Z9181 History of falling: Secondary | ICD-10-CM | POA: Diagnosis not present

## 2021-12-09 DIAGNOSIS — E559 Vitamin D deficiency, unspecified: Secondary | ICD-10-CM | POA: Diagnosis not present

## 2021-12-09 DIAGNOSIS — F17210 Nicotine dependence, cigarettes, uncomplicated: Secondary | ICD-10-CM | POA: Diagnosis not present

## 2021-12-09 DIAGNOSIS — Z79899 Other long term (current) drug therapy: Secondary | ICD-10-CM | POA: Diagnosis not present

## 2021-12-09 DIAGNOSIS — Z5320 Procedure and treatment not carried out because of patient's decision for unspecified reasons: Secondary | ICD-10-CM | POA: Diagnosis not present

## 2021-12-10 DIAGNOSIS — J432 Centrilobular emphysema: Secondary | ICD-10-CM | POA: Diagnosis not present

## 2021-12-10 DIAGNOSIS — J189 Pneumonia, unspecified organism: Secondary | ICD-10-CM | POA: Diagnosis not present

## 2021-12-11 DIAGNOSIS — F1120 Opioid dependence, uncomplicated: Secondary | ICD-10-CM | POA: Diagnosis not present

## 2022-01-07 DIAGNOSIS — J45901 Unspecified asthma with (acute) exacerbation: Secondary | ICD-10-CM | POA: Diagnosis not present

## 2022-01-08 DIAGNOSIS — Z5320 Procedure and treatment not carried out because of patient's decision for unspecified reasons: Secondary | ICD-10-CM | POA: Diagnosis not present

## 2022-01-08 DIAGNOSIS — Z79899 Other long term (current) drug therapy: Secondary | ICD-10-CM | POA: Diagnosis not present

## 2022-01-08 DIAGNOSIS — F17210 Nicotine dependence, cigarettes, uncomplicated: Secondary | ICD-10-CM | POA: Diagnosis not present

## 2022-01-08 DIAGNOSIS — Z9181 History of falling: Secondary | ICD-10-CM | POA: Diagnosis not present

## 2022-01-08 DIAGNOSIS — C8590 Non-Hodgkin lymphoma, unspecified, unspecified site: Secondary | ICD-10-CM | POA: Diagnosis not present

## 2022-01-08 DIAGNOSIS — Z6823 Body mass index (BMI) 23.0-23.9, adult: Secondary | ICD-10-CM | POA: Diagnosis not present

## 2022-01-08 DIAGNOSIS — F1120 Opioid dependence, uncomplicated: Secondary | ICD-10-CM | POA: Diagnosis not present

## 2022-01-08 DIAGNOSIS — E119 Type 2 diabetes mellitus without complications: Secondary | ICD-10-CM | POA: Diagnosis not present

## 2022-01-08 DIAGNOSIS — E559 Vitamin D deficiency, unspecified: Secondary | ICD-10-CM | POA: Diagnosis not present

## 2022-01-09 DIAGNOSIS — J432 Centrilobular emphysema: Secondary | ICD-10-CM | POA: Diagnosis not present

## 2022-01-09 DIAGNOSIS — J189 Pneumonia, unspecified organism: Secondary | ICD-10-CM | POA: Diagnosis not present

## 2022-01-10 DIAGNOSIS — F1120 Opioid dependence, uncomplicated: Secondary | ICD-10-CM | POA: Diagnosis not present

## 2022-01-22 DIAGNOSIS — J101 Influenza due to other identified influenza virus with other respiratory manifestations: Secondary | ICD-10-CM | POA: Diagnosis not present

## 2022-01-22 DIAGNOSIS — Z6824 Body mass index (BMI) 24.0-24.9, adult: Secondary | ICD-10-CM | POA: Diagnosis not present

## 2022-01-22 DIAGNOSIS — R197 Diarrhea, unspecified: Secondary | ICD-10-CM | POA: Diagnosis not present

## 2022-01-22 DIAGNOSIS — Z20828 Contact with and (suspected) exposure to other viral communicable diseases: Secondary | ICD-10-CM | POA: Diagnosis not present

## 2022-02-03 DIAGNOSIS — N390 Urinary tract infection, site not specified: Secondary | ICD-10-CM | POA: Diagnosis not present

## 2022-02-03 DIAGNOSIS — Z79899 Other long term (current) drug therapy: Secondary | ICD-10-CM | POA: Diagnosis not present

## 2022-02-03 DIAGNOSIS — Z6823 Body mass index (BMI) 23.0-23.9, adult: Secondary | ICD-10-CM | POA: Diagnosis not present

## 2022-02-03 DIAGNOSIS — R809 Proteinuria, unspecified: Secondary | ICD-10-CM | POA: Diagnosis not present

## 2022-02-03 DIAGNOSIS — F1120 Opioid dependence, uncomplicated: Secondary | ICD-10-CM | POA: Diagnosis not present

## 2022-02-03 DIAGNOSIS — E559 Vitamin D deficiency, unspecified: Secondary | ICD-10-CM | POA: Diagnosis not present

## 2022-02-03 DIAGNOSIS — M545 Low back pain, unspecified: Secondary | ICD-10-CM | POA: Diagnosis not present

## 2022-02-03 DIAGNOSIS — E876 Hypokalemia: Secondary | ICD-10-CM | POA: Diagnosis not present

## 2022-02-03 DIAGNOSIS — E119 Type 2 diabetes mellitus without complications: Secondary | ICD-10-CM | POA: Diagnosis not present

## 2022-02-03 DIAGNOSIS — I10 Essential (primary) hypertension: Secondary | ICD-10-CM | POA: Diagnosis not present

## 2022-02-03 DIAGNOSIS — F17210 Nicotine dependence, cigarettes, uncomplicated: Secondary | ICD-10-CM | POA: Diagnosis not present

## 2022-02-03 DIAGNOSIS — Z9181 History of falling: Secondary | ICD-10-CM | POA: Diagnosis not present

## 2022-02-03 DIAGNOSIS — Z1321 Encounter for screening for nutritional disorder: Secondary | ICD-10-CM | POA: Diagnosis not present

## 2022-02-03 DIAGNOSIS — F419 Anxiety disorder, unspecified: Secondary | ICD-10-CM | POA: Diagnosis not present

## 2022-02-03 DIAGNOSIS — Z5320 Procedure and treatment not carried out because of patient's decision for unspecified reasons: Secondary | ICD-10-CM | POA: Diagnosis not present

## 2022-02-03 DIAGNOSIS — J449 Chronic obstructive pulmonary disease, unspecified: Secondary | ICD-10-CM | POA: Diagnosis not present

## 2022-02-03 DIAGNOSIS — Z0000 Encounter for general adult medical examination without abnormal findings: Secondary | ICD-10-CM | POA: Diagnosis not present

## 2022-02-03 DIAGNOSIS — C8590 Non-Hodgkin lymphoma, unspecified, unspecified site: Secondary | ICD-10-CM | POA: Diagnosis not present

## 2022-02-03 DIAGNOSIS — Z13228 Encounter for screening for other metabolic disorders: Secondary | ICD-10-CM | POA: Diagnosis not present

## 2022-02-06 DIAGNOSIS — F1120 Opioid dependence, uncomplicated: Secondary | ICD-10-CM | POA: Diagnosis not present

## 2022-02-07 DIAGNOSIS — J45901 Unspecified asthma with (acute) exacerbation: Secondary | ICD-10-CM | POA: Diagnosis not present

## 2022-02-09 DIAGNOSIS — J432 Centrilobular emphysema: Secondary | ICD-10-CM | POA: Diagnosis not present

## 2022-02-09 DIAGNOSIS — J189 Pneumonia, unspecified organism: Secondary | ICD-10-CM | POA: Diagnosis not present

## 2022-02-10 DIAGNOSIS — Z9484 Stem cells transplant status: Secondary | ICD-10-CM | POA: Diagnosis not present

## 2022-02-10 DIAGNOSIS — C8331 Diffuse large B-cell lymphoma, lymph nodes of head, face, and neck: Secondary | ICD-10-CM | POA: Diagnosis not present

## 2022-02-10 DIAGNOSIS — C8290 Follicular lymphoma, unspecified, unspecified site: Secondary | ICD-10-CM | POA: Diagnosis not present

## 2022-02-10 DIAGNOSIS — Z23 Encounter for immunization: Secondary | ICD-10-CM | POA: Diagnosis not present

## 2022-02-20 ENCOUNTER — Observation Stay
Admission: EM | Admit: 2022-02-20 | Discharge: 2022-02-21 | Disposition: A | Discharge status: Home / Self Care | Payer: PPO | Attending: Internal Medicine | Admitting: Internal Medicine

## 2022-02-20 ENCOUNTER — Emergency Department: Payer: PPO

## 2022-02-20 ENCOUNTER — Encounter: Payer: Self-pay | Admitting: Internal Medicine

## 2022-02-20 DIAGNOSIS — R4182 Altered mental status, unspecified: Secondary | ICD-10-CM | POA: Insufficient documentation

## 2022-02-20 DIAGNOSIS — I10 Essential (primary) hypertension: Secondary | ICD-10-CM | POA: Diagnosis not present

## 2022-02-20 DIAGNOSIS — F17200 Nicotine dependence, unspecified, uncomplicated: Secondary | ICD-10-CM | POA: Diagnosis present

## 2022-02-20 DIAGNOSIS — Z79899 Other long term (current) drug therapy: Secondary | ICD-10-CM | POA: Insufficient documentation

## 2022-02-20 DIAGNOSIS — I6381 Other cerebral infarction due to occlusion or stenosis of small artery: Secondary | ICD-10-CM | POA: Diagnosis not present

## 2022-02-20 DIAGNOSIS — C8290 Follicular lymphoma, unspecified, unspecified site: Secondary | ICD-10-CM | POA: Diagnosis present

## 2022-02-20 DIAGNOSIS — C8299 Follicular lymphoma, unspecified, extranodal and solid organ sites: Secondary | ICD-10-CM | POA: Insufficient documentation

## 2022-02-20 DIAGNOSIS — R0602 Shortness of breath: Secondary | ICD-10-CM | POA: Diagnosis not present

## 2022-02-20 DIAGNOSIS — Z91040 Latex allergy status: Secondary | ICD-10-CM | POA: Insufficient documentation

## 2022-02-20 DIAGNOSIS — G9341 Metabolic encephalopathy: Secondary | ICD-10-CM | POA: Diagnosis not present

## 2022-02-20 DIAGNOSIS — J45909 Unspecified asthma, uncomplicated: Secondary | ICD-10-CM | POA: Insufficient documentation

## 2022-02-20 DIAGNOSIS — R0902 Hypoxemia: Secondary | ICD-10-CM | POA: Diagnosis not present

## 2022-02-20 DIAGNOSIS — F17210 Nicotine dependence, cigarettes, uncomplicated: Secondary | ICD-10-CM | POA: Insufficient documentation

## 2022-02-20 DIAGNOSIS — Z8616 Personal history of COVID-19: Secondary | ICD-10-CM | POA: Diagnosis not present

## 2022-02-20 DIAGNOSIS — U071 COVID-19: Secondary | ICD-10-CM | POA: Diagnosis present

## 2022-02-20 DIAGNOSIS — Z79891 Long term (current) use of opiate analgesic: Secondary | ICD-10-CM | POA: Diagnosis not present

## 2022-02-20 DIAGNOSIS — E119 Type 2 diabetes mellitus without complications: Secondary | ICD-10-CM | POA: Diagnosis not present

## 2022-02-20 DIAGNOSIS — R000 Tachycardia, unspecified: Secondary | ICD-10-CM | POA: Diagnosis not present

## 2022-02-20 HISTORY — DX: Metabolic encephalopathy: G93.41

## 2022-02-20 LAB — URINALYSIS, ROUTINE W REFLEX MICROSCOPIC
Bacteria, UA: NONE SEEN
Bilirubin Urine: NEGATIVE
Glucose, UA: >=500 mg/dL — AB
Hgb urine dipstick: NEGATIVE
Ketones, ur: NEGATIVE mg/dL
Leukocytes,Ua: NEGATIVE
Nitrite: NEGATIVE
Protein, ur: NEGATIVE mg/dL
Specific Gravity, Urine: 1.017 (ref 1.005–1.030)
pH: 5 (ref 5.0–8.0)

## 2022-02-20 LAB — CBC WITH DIFFERENTIAL/PLATELET
Abs Immature Granulocytes: 0.06 10*3/uL (ref 0.00–0.07)
Basophils Absolute: 0 10*3/uL (ref 0.0–0.1)
Basophils Relative: 0 %
Eosinophils Absolute: 0.2 10*3/uL (ref 0.0–0.5)
Eosinophils Relative: 1 %
HCT: 37.6 % (ref 36.0–46.0)
Hemoglobin: 12.1 g/dL (ref 12.0–15.0)
Immature Granulocytes: 1 %
Lymphocytes Relative: 12 %
Lymphs Abs: 1.4 10*3/uL (ref 0.7–4.0)
MCH: 30.3 pg (ref 26.0–34.0)
MCHC: 32.2 g/dL (ref 30.0–36.0)
MCV: 94 fL (ref 80.0–100.0)
Monocytes Absolute: 0.9 10*3/uL (ref 0.1–1.0)
Monocytes Relative: 8 %
Neutro Abs: 8.8 10*3/uL — ABNORMAL HIGH (ref 1.7–7.7)
Neutrophils Relative %: 78 %
Platelets: 175 10*3/uL (ref 150–400)
RBC: 4 MIL/uL (ref 3.87–5.11)
RDW: 13.9 % (ref 11.5–15.5)
WBC: 11.3 10*3/uL — ABNORMAL HIGH (ref 4.0–10.5)
nRBC: 0 % (ref 0.0–0.2)

## 2022-02-20 LAB — URINE DRUG SCREEN, QUALITATIVE (ARMC ONLY)
Amphetamines, Ur Screen: NOT DETECTED
Barbiturates, Ur Screen: NOT DETECTED
Benzodiazepine, Ur Scrn: NOT DETECTED
Cannabinoid 50 Ng, Ur ~~LOC~~: NOT DETECTED
Cocaine Metabolite,Ur ~~LOC~~: NOT DETECTED
MDMA (Ecstasy)Ur Screen: NOT DETECTED
Methadone Scn, Ur: NOT DETECTED
Opiate, Ur Screen: NOT DETECTED
Phencyclidine (PCP) Ur S: NOT DETECTED
Tricyclic, Ur Screen: NOT DETECTED

## 2022-02-20 LAB — RESP PANEL BY RT-PCR (RSV, FLU A&B, COVID)  RVPGX2
Influenza A by PCR: NEGATIVE
Influenza B by PCR: NEGATIVE
Resp Syncytial Virus by PCR: NEGATIVE
SARS Coronavirus 2 by RT PCR: POSITIVE — AB

## 2022-02-20 LAB — COMPREHENSIVE METABOLIC PANEL
ALT: 17 U/L (ref 0–44)
AST: 32 U/L (ref 15–41)
Albumin: 4.3 g/dL (ref 3.5–5.0)
Alkaline Phosphatase: 98 U/L (ref 38–126)
Anion gap: 11 (ref 5–15)
BUN: 13 mg/dL (ref 8–23)
CO2: 22 mmol/L (ref 22–32)
Calcium: 9.2 mg/dL (ref 8.9–10.3)
Chloride: 103 mmol/L (ref 98–111)
Creatinine, Ser: 0.89 mg/dL (ref 0.44–1.00)
GFR, Estimated: >60 mL/min (ref >60)
Glucose, Bld: 126 mg/dL — ABNORMAL HIGH (ref 70–99)
Potassium: 4.4 mmol/L (ref 3.5–5.1)
Sodium: 136 mmol/L (ref 135–145)
Total Bilirubin: 0.7 mg/dL (ref 0.3–1.2)
Total Protein: 7.7 g/dL (ref 6.5–8.1)

## 2022-02-20 LAB — BLOOD GAS, VENOUS
Acid-Base Excess: 1 mmol/L (ref 0.0–2.0)
Bicarbonate: 27.9 mmol/L (ref 20.0–28.0)
O2 Saturation: 30.8 %
Patient temperature: 37
pCO2, Ven: 53 mmHg (ref 44–60)
pH, Ven: 7.33 (ref 7.25–7.43)
pO2, Ven: <31 mmHg — CL (ref 32–45)

## 2022-02-20 LAB — TROPONIN I (HIGH SENSITIVITY): Troponin I (High Sensitivity): 5 ng/L (ref <18)

## 2022-02-20 LAB — ETHANOL: Alcohol, Ethyl (B): <10 mg/dL (ref <10)

## 2022-02-20 LAB — CBG MONITORING, ED: Glucose-Capillary: 94 mg/dL (ref 70–99)

## 2022-02-20 MED ORDER — ACETAMINOPHEN 325 MG PO TABS: 650.0000 mg | ORAL_TABLET | Freq: Four times a day (QID) | ORAL | Status: DC | PRN

## 2022-02-20 MED ORDER — ACETAMINOPHEN 325 MG RE SUPP: 650.0000 mg | Freq: Four times a day (QID) | RECTAL | Status: DC | PRN

## 2022-02-20 MED ORDER — LACTATED RINGERS IV SOLN: INTRAVENOUS | Status: AC

## 2022-02-20 MED ORDER — NIRMATRELVIR/RITONAVIR (PAXLOVID)TABLET: 3.0000 | ORAL_TABLET | Freq: Two times a day (BID) | ORAL | Status: DC

## 2022-02-20 MED ORDER — ALBUTEROL SULFATE (2.5 MG/3ML) 0.083% IN NEBU: 2.5000 mg | INHALATION_SOLUTION | RESPIRATORY_TRACT | Status: DC | PRN

## 2022-02-20 MED ORDER — HEPARIN SODIUM (PORCINE) 5000 UNIT/ML IJ SOLN: 5000.0000 [IU] | Freq: Three times a day (TID) | INTRAMUSCULAR | Status: DC

## 2022-02-20 MED ORDER — RAMIPRIL 2.5 MG PO CAPS
2.5000 mg | ORAL_CAPSULE | Freq: Every day | ORAL | Status: DC
  Filled 2022-02-20: qty 1

## 2022-02-20 MED ORDER — HEPARIN SODIUM (PORCINE) 5000 UNIT/ML IJ SOLN
5000.0000 [IU] | Freq: Three times a day (TID) | INTRAMUSCULAR | Status: DC
  Administered 2022-02-21: 5000 [IU] via SUBCUTANEOUS
  Filled 2022-02-20: qty 1

## 2022-02-20 NOTE — Assessment & Plan Note (Signed)
Observation med/tele bed. Continue with IVF. Suspect accidental overdose of suboxone. Pt's husband only found out in the last 8 months that pt has been using suboxone for the last 3 years. Pt with hx of prescription opiate misuse/abuse. Pt receives Rx for suboxone 8/2 #90 every 30 days from Simona Huh, NP with [facility] pain clinic. Pt's Rx for suboxone not in PDMP. Pharmacy tech did verify that pt does get Rx for Suboxne and [facility]. Unclear why Suboxone Rx data not available in PDMP. Husband states pt was tired today. Verified with husband that pt does carry suboxone film in her purse. Husband does not know why pt keeps her supply. Husband states that pt told NP not to tell husband about her using suboxone. Discussed with husband that if suboxone overdose was suspected that IV narcan could wake her up and definitely tell if opiates where the reason for her excessive somnolence. Pt is not in an respiratory distress or having any apnea. Husband decided against giving pt IV narcan. Husband will approach patient about any potential misuse of suboxone. UDS negative for opiate but not surprising because suboxone does not show up on routine UDS just like fentanyl.

## 2022-02-20 NOTE — ED Notes (Signed)
ED Provider at bedside. 

## 2022-02-20 NOTE — Subjective & Objective (Signed)
CC: somnolence HPI: 72 year old female history of follicular lymphoma followed by [facility] oncology, hypertension, smoker, chronic opiate user, history of diffuse large cell B lymphoma status post stem cell transplant presents to the ER today with excessive somnolence.  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  Patient came home from a hair salon with her husband.  He states the patient was excessively tired today.  He was unsure why.  Patient cannot see the rest of her clients for the afternoon because of fatigue.  Husband states that he left the house for about 20 minutes to go get patient some dinner.  When he returned 20 minutes later, the patient was somnolent.  He had difficulty arousing her.  Husband called EMS.  Patient's been taking Suboxone via [facility] for the last 3 years.  She had her Suboxone use from her husband for about 2-1/2 years.  Husband states that he has only found out the last 8 months she has been using Suboxone.  He states he confronted the pain clinic doctor about this.  Pain clinic doctor told him that the patient had specifically requested the husband not to find out about her opiate use.  Husband states that the patient has had a history of opiate misuse while she was undergoing chemotherapy in the past.  Patient does not let the husband manage her Suboxone supply.  Workup in the ER is unremarkable.  Head CT is negative.  Husband searched the patient's purse and found Suboxone films in her purse.  I Discussed with the patient's husband that IV Narcan could Potentially reverse her Suboxone and clear up the patient's altered mental status.  However this would probably cause her to be in acute pain.  Husband declines to give the patient IV Narcan.  Patient is not in any respiratory distress.  She is not apneic.  Tried hospitalist contacted for admission.

## 2022-02-20 NOTE — Assessment & Plan Note (Signed)
Stable. 

## 2022-02-20 NOTE — Assessment & Plan Note (Signed)
Start paxlovid

## 2022-02-20 NOTE — H&P (Addendum)
History and Physical    Beth Stanley ZCH:[id number] DOB: 03/26/1950 DOA: 02/20/2022  DOS: the patient was seen and examined on 02/20/2022  PCP: Rhea Bleacher, NP   Patient coming from: Home  I have personally briefly reviewed patient's old medical records in [place]  CC: somnolence HPI: 72 year old female history of follicular lymphoma followed by [facility] oncology, hypertension, smoker, chronic opiate user, history of diffuse large cell B lymphoma status post stem cell transplant presents to the ER today with excessive somnolence.  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  Patient came home from a hair salon with her husband.  He states the patient was excessively tired today.  He was unsure why.  Patient cannot see the rest of her clients for the afternoon because of fatigue.  Husband states that he left the house for about 20 minutes to go get patient some dinner.  When he returned 20 minutes later, the patient was somnolent.  He had difficulty arousing her.  Husband called EMS.  Patient's been taking Suboxone via [facility] for the last 3 years.  She had her Suboxone use from her husband for about 2-1/2 years.  Husband states that he has only found out the last 8 months she has been using Suboxone.  He states he confronted the pain clinic doctor about this.  Pain clinic doctor told him that the patient had specifically requested the husband not to find out about her opiate use.  Husband states that the patient has had a history of opiate misuse while she was undergoing chemotherapy in the past.  Patient does not let the husband manage her Suboxone supply.  Workup in the ER is unremarkable.  Head CT is negative.  Husband searched the patient's purse and found Suboxone films in her purse.  I Discussed with the patient's husband that IV Narcan could Potentially reverse her Suboxone and clear up the patient's altered mental status.  However this would probably cause her  to be in acute pain.  Husband declines to give the patient IV Narcan.  Patient is not in any respiratory distress.  She is not apneic.  Tried hospitalist contacted for admission.   ED Course: CT head negative.   Review of Systems:  Review of Systems  Unable to perform ROS: Mental acuity    Past Medical History:  Diagnosis Date   Anxiety    Arthritis    C. difficile colitis 10/01/2019   COVID-19 virus infection 09/29/2019   Diabetes ([place])    Heartburn    History of stem cell transplant ([place])    HTN (hypertension)    Multifocal pneumonia 04/19/2021   Non Hodgkin's lymphoma ([place]) 2004   Hx chemo tx's.     Past Surgical History:  Procedure Laterality Date   TOTAL ABDOMINAL HYSTERECTOMY       reports that she has been smoking cigarettes. She has been smoking an average of 1 pack per day. She has never used smokeless tobacco. She reports that she does not currently use alcohol. She reports that she does not use drugs.  Allergies  Allergen Reactions   Aspirin     Stomach cramp   Latex Swelling   Other    Tape Rash    Makes Whelps on Skin Makes Whelps on Skin    Family History  Problem Relation Age of Onset   Diabetes Mother    Lung cancer Sister    Lung cancer Brother    Kidney cancer  Brother        removed kidney    Lung cancer Brother    Prostate cancer Neg Hx    Bladder Cancer Neg Hx     Prior to Admission medications   Medication Sig Start Date End Date Taking? Authorizing Provider  Buprenorphine HCl-Naloxone HCl 8-2 MG FILM Place 1 strip under the tongue 3 (three) times daily. 11/04/20  Yes [provider]  Cholecalciferol 1.25 MG (50000 UT) capsule Take 1 tablet by mouth once a week.   Yes [provider]  cyanocobalamin 1000 MCG tablet Take 1 tablet by mouth daily. 04/17/20  Yes [provider]  estradiol (ESTRACE) 0.1 MG/GM vaginal cream Place 1 Applicatorful vaginally daily.   Yes [provider]  ferrous sulfate 325  (65 FE) MG tablet Take 325 mg by mouth 3 (three) times a week. Monday. Weds, fri 02/11/21  Yes [provider]  gemfibrozil (LOPID) 600 MG tablet Take 600 mg by mouth 2 (two) times daily before a meal.   Yes [provider]  JARDIANCE 25 MG TABS tablet Take 25 mg by mouth daily. 08/17/20  Yes [provider]  magnesium oxide (MAG-OX) 400 (240 Mg) MG tablet Take 1 tablet by mouth daily. 10/01/20  Yes [provider]  methocarbamol (ROBAXIN) 500 MG tablet Take 500 mg by mouth 2 (two) times daily.   Yes [provider]  omeprazole (PRILOSEC) 20 MG capsule Take 20 mg by mouth daily. 08/25/20  Yes [provider]  phenazopyridine (PYRIDIUM) 200 MG tablet Take 200 mg by mouth 3 (three) times daily as needed for pain.   Yes [provider]  potassium chloride SA (KLOR-CON) 20 MEQ tablet Take 20 mEq by mouth daily. 07/28/19  Yes [provider]  ramipril (ALTACE) 2.5 MG capsule Take 1 capsule (2.5 mg total) by mouth daily. 08/08/17  Yes [name], PA-C  sertraline (ZOLOFT) 100 MG tablet TAKE ONE (1) TABLET EACH DAY Patient taking differently: Take 150 mg by mouth daily. 06/10/17  Yes [name], PA-C  TRELEGY ELLIPTA 100-62.5-25 MCG/ACT AEPB Inhale 1 puff into the lungs daily. 04/04/21  Yes [provider]  valACYclovir (VALTREX) 1000 MG tablet Take 1,000 mg by mouth 2 (two) times daily. 08/22/20  Yes [provider]  acetaminophen (TYLENOL) 500 MG tablet Take 1,000 mg by mouth every 6 (six) hours as needed.    [provider]  albuterol (VENTOLIN HFA) 108 (90 Base) MCG/ACT inhaler Inhale 1 puff into the lungs every 6 (six) hours as needed for wheezing. 09/02/19   [provider]  carboxymethylcellulose (REFRESH PLUS) 0.5 % SOLN Place 1 drop into the left eye 2 (two) times daily. Patient not taking: Reported on 02/20/2022 10/19/20   [provider]  dextromethorphan-guaiFENesin (MUCINEX DM)  30-600 MG 12hr tablet Take 1 tablet by mouth 2 (two) times daily. Patient not taking: Reported on 02/20/2022 04/24/21   Lorella Nimrod, MD  diclofenac Sodium (VOLTAREN) 1 % GEL Apply 4 g topically 4 (four) times daily. Patient not taking: Reported on 02/20/2022    [provider]  fluticasone-salmeterol (ADVAIR) 250-50 MCG/ACT AEPB Inhale 1 puff into the lungs 2 (two) times daily as needed.    [provider]  ipratropium-albuterol (DUONEB) 0.5-2.5 (3) MG/3ML SOLN Take 3 mLs by nebulization every 6 (six) hours as needed (For wheezing). Patient not taking: Reported on 02/20/2022 04/24/21   Lorella Nimrod, MD  Lancets (FREESTYLE) lancets TEST BLOOD GLUCOSE ONCE DAILY Patient not taking:  Reported on 02/20/2022 07/28/16   [name], PA-C  magic mouthwash SOLN Take 15 mLs by mouth 4 (four) times daily as needed for mouth pain. Patient not taking: Reported on 02/20/2022    [provider]  nicotine (NICODERM CQ - DOSED IN MG/24 HOURS) 21 mg/24hr patch Place 1 patch (21 mg total) onto the skin daily. Patient not taking: Reported on 02/20/2022 04/25/21   Lorella Nimrod, MD  polyethylene glycol (MIRALAX / GLYCOLAX) 17 g packet Take 17 g by mouth daily as needed for mild constipation. Patient not taking: Reported on 02/20/2022 04/24/21   Lorella Nimrod, MD  predniSONE (DELTASONE) 10 MG tablet Take 6 tablets (60 mg total) by mouth daily with breakfast. For 3 days, followed by decreasing 1 tablet or 10 mg every 5-day until you finish all the medicine. Patient not taking: Reported on 02/20/2022 04/25/21   Lorella Nimrod, MD    Physical Exam: Vitals:   02/20/22 2200 02/20/22 2230 02/20/22 2232 02/20/22 2300  BP: (!) 119/58 115/70  122/74  Pulse: (!) 107 (!) 111  (!) 111  Resp: '19 17  15  '$ Temp:      TempSrc:      SpO2: 96% 90% 97% 98%    Physical Exam Vitals and nursing note reviewed.  Constitutional:      General: She is not in acute distress.    Appearance: She is not toxic-appearing.      Comments: Somnolent. Will arouse to physical stimuli and then fall back to sleep  HENT:     Head: Normocephalic and atraumatic.     Nose: Nose normal.  Cardiovascular:     Rate and Rhythm: Regular rhythm. Tachycardia present.     Pulses: Normal pulses.  Pulmonary:     Effort: Pulmonary effort is normal. No respiratory distress.  Abdominal:     General: Abdomen is protuberant. Bowel sounds are normal. There is no distension.     Palpations: Abdomen is soft.     Tenderness: There is no abdominal tenderness.  Musculoskeletal:     Right lower leg: No edema.     Left lower leg: No edema.  Skin:    General: Skin is warm and dry.     Capillary Refill: Capillary refill takes less than 2 seconds.  Neurological:     Mental Status: She is disoriented.     Comments: somnolent      Labs on Admission: I have personally reviewed following labs and imaging studies  CBC: Recent Labs  Lab 02/20/22 2141  WBC 11.3*  NEUTROABS 8.8*  HGB 12.1  HCT 37.6  MCV 94.0  PLT 381   Basic Metabolic Panel: Recent Labs  Lab 02/20/22 2141  NA 136  K 4.4  CL 103  CO2 22  GLUCOSE 126*  BUN 13  CREATININE 0.89  CALCIUM 9.2   GFR: CrCl cannot be calculated (Unknown ideal weight.). Liver Function Tests: Recent Labs  Lab 02/20/22 2141  AST 32  ALT 17  ALKPHOS 98  BILITOT 0.7  PROT 7.7  ALBUMIN 4.3   No results for input(s): "LIPASE", "AMYLASE" in the last 168 hours. No results for input(s): "AMMONIA" in the last 168 hours. Coagulation Profile: No results for input(s): "INR", "PROTIME" in the last 168 hours. Cardiac Enzymes: Recent Labs  Lab 02/20/22 2141  TROPONINIHS 5   BNP (last 3 results) No results for input(s): "PROBNP" in the last 8760 hours. HbA1C: No results for input(s): "HGBA1C" in the last 72 hours. CBG: Recent Labs  Lab 02/20/22 2137  GLUCAP 94   Lipid Profile: No results for input(s): "CHOL", "HDL", "LDLCALC", "TRIG", "CHOLHDL", "LDLDIRECT" in the last 72  hours. Thyroid Function Tests: No results for input(s): "TSH", "T4TOTAL", "FREET4", "T3FREE", "THYROIDAB" in the last 72 hours. Anemia Panel: No results for input(s): "VITAMINB12", "FOLATE", "FERRITIN", "TIBC", "IRON", "RETICCTPCT" in the last 72 hours. Urine analysis:    Component Value Date/Time   COLORURINE YELLOW (A) 02/20/2022 2235   APPEARANCEUR CLEAR (A) 02/20/2022 2235   APPEARANCEUR Clear 06/05/2016 1003   LABSPEC 1.017 02/20/2022 2235   PHURINE 5.0 02/20/2022 2235   GLUCOSEU >=500 (A) 02/20/2022 2235   HGBUR NEGATIVE 02/20/2022 2235   BILIRUBINUR NEGATIVE 02/20/2022 2235   BILIRUBINUR negative 02/16/2017 1628   BILIRUBINUR Negative 06/05/2016 1003   KETONESUR NEGATIVE 02/20/2022 2235   PROTEINUR NEGATIVE 02/20/2022 2235   UROBILINOGEN 0.2 02/16/2017 1628   UROBILINOGEN 0.2 01/24/2010 1052   NITRITE NEGATIVE 02/20/2022 2235   LEUKOCYTESUR NEGATIVE 02/20/2022 2235   Lab Results  Component Value Date   SARSCOV2NAA POSITIVE (A) 02/20/2022    Radiological Exams on Admission: I have personally reviewed images CT Head Wo Contrast  Result Date: 02/20/2022 CLINICAL DATA:  Mental status change EXAM: CT HEAD WITHOUT CONTRAST TECHNIQUE: Contiguous axial images were obtained from the base of the skull through the vertex without intravenous contrast. RADIATION DOSE REDUCTION: This exam was performed according to the departmental dose-optimization program which includes automated exposure control, adjustment of the mA and/or kV according to patient size and/or use of iterative reconstruction technique. COMPARISON:  MRI brain 11/16/2020, CT brain 11/15/2020 FINDINGS: Brain: No acute territorial infarction, hemorrhage or intracranial mass. Mild atrophy. Extensive confluent white matter hypodensity without significant change. Chronic appearing lacunar infarct left basal ganglia. Stable ventricle size. Vascular: No hyperdense vessels.  Carotid vascular calcification Skull: Normal. Negative for  fracture or focal lesion. Sinuses/Orbits: Moderate mucosal thickening in the right ethmoid maxillary and frontal sinuses. Other: None. IMPRESSION: 1. No CT evidence for acute intracranial abnormality. 2. Atrophy and chronic small vessel ischemic changes of the white matter. Electronically Signed   By: Donavan Foil M.D.   On: 02/20/2022 22:26   DG Chest Portable 1 View  Result Date: 02/20/2022 CLINICAL DATA:  Shortness of breath EXAM: PORTABLE CHEST 1 VIEW COMPARISON:  04/19/2021 FINDINGS: Right-sided central venous port tip at the right atrium. No acute airspace disease. Atelectasis or scar at the left base. Stable cardiomediastinal silhouette. IMPRESSION: No active disease. Atelectasis or scar at the left base. Electronically Signed   By: Donavan Foil M.D.   On: 02/20/2022 21:51    EKG: My personal interpretation of EKG shows: no EKG    Assessment/Plan Principal Problem:   Acute metabolic encephalopathy Active Problems:   Hypertension   Smoker   Follicular lymphoma ([place])   Chronic prescription opiate use - subxone Rx'd by Simona Huh, NP at [facility]   COVID-19 virus infection    Assessment and Plan: * Acute metabolic encephalopathy Observation med/tele bed. Continue with IVF. Suspect accidental overdose of suboxone. Pt's husband only found out in the last 8 months that pt has been using suboxone for the last 3 years. Pt with hx of prescription opiate misuse/abuse. Pt receives Rx for suboxone 8/2 #90 every 30 days from Simona Huh, NP with [facility] pain clinic. Pt's Rx for suboxone not in PDMP. Pharmacy tech did verify that pt does get Rx for [facility]. Unclear why Suboxone Rx data not available in PDMP. Husband states  pt was tired today. Verified with husband that pt does carry suboxone film in her purse. Husband does not know why pt keeps her supply. Husband states that pt told NP not to tell husband about her using suboxone. Discussed with husband that if  suboxone overdose was suspected that IV narcan could wake her up and definitely tell if opiates where the reason for her excessive somnolence. Pt is not in an respiratory distress or having any apnea. Husband decided against giving pt IV narcan. Husband will approach patient about any potential misuse of suboxone. UDS negative for opiate but not surprising because suboxone does not show up on routine UDS just like fentanyl.  Follicular lymphoma (HCC) Chronic. Follows with [facility].  Smoker Stable.  Hypertension Stable.  COVID-19 virus infection Start paxlovid   DVT prophylaxis: SQ Heparin Code Status: Full Code Family Communication: discussed with pt's husband  Herbie [place] Disposition Plan: return home  Consults called: none  Admission status: Observation, Telemetry bed   Kristopher Oppenheim, DO [facility] 02/20/2022, 11:47 PM

## 2022-02-20 NOTE — Assessment & Plan Note (Signed)
Chronic. Follows with [facility].

## 2022-02-20 NOTE — ED Provider Notes (Signed)
[facility] Provider Note    Event Date/Time   First MD Initiated Contact with Patient 02/20/22 2124     (approximate)   History   Chief Complaint Shortness of Breath and Fatigue   HPI  Beth Stanley is a 72 y.o. female with past medical history of hypertension, diabetes, anxiety, and B-cell lymphoma who presents to the ED for altered mental status.  Per EMS, husband called 911 due to patient being somnolent and lethargic at home, it is unknown when she was last known well.  Patient somnolent on EMS arrival, but was arousable to voice and was able to ambulate to their stretcher.  Patient was reportedly hypoxic to the mid 80s on room air at one time, placed on supplemental oxygen via nasal cannula by EMS.  No respiratory depression noted by EMS and patient remains somnolent on arrival to the ED.  She is arousable to voice and denies any pain or difficulty breathing.  She states she feels like "I am not worth anything, but is unable to voice any specific complaints.     Physical Exam   Triage Vital Signs: ED Triage Vitals [02/20/22 2127]  Enc Vitals Group     BP 121/63     Pulse Rate (!) 110     Resp (!) 22     Temp 99.1 F (37.3 C)     Temp Source Oral     SpO2 98 %     Weight      Height      Head Circumference      Peak Flow      Pain Score      Pain Loc      Pain Edu?      Excl. in [place]?     Most recent vital signs: Vitals:   02/20/22 2230 02/20/22 2232  BP: 115/70   Pulse: (!) 111   Resp: 17   Temp:    SpO2: 90% 97%    Constitutional: Somnolent but arousable to voice. Eyes: Conjunctivae are normal.  Pupils equal, round, and reactive to light bilaterally. Head: Atraumatic. Nose: No congestion/rhinnorhea. Mouth/Throat: Mucous membranes are moist.  Neck: Supple with no meningismus. Cardiovascular: Tachycardic, regular rhythm. Grossly normal heart sounds.  2+ radial pulses bilaterally. Respiratory: Normal respiratory effort.  No  retractions. Lungs CTAB. Gastrointestinal: Soft and nontender. No distention. Musculoskeletal: No lower extremity tenderness nor edema.  Neurologic: Slurred speech noted. No gross focal neurologic deficits are appreciated, patient moving all extremities with encouragement.    ED Results / Procedures / Treatments   Labs (all labs ordered are listed, but only abnormal results are displayed) Labs Reviewed  CBC WITH DIFFERENTIAL/PLATELET - Abnormal; Notable for the following components:      Result Value   WBC 11.3 (*)    Neutro Abs 8.8 (*)    All other components within normal limits  COMPREHENSIVE METABOLIC PANEL - Abnormal; Notable for the following components:   Glucose, Bld 126 (*)    All other components within normal limits  BLOOD GAS, VENOUS - Abnormal; Notable for the following components:   pO2, Ven <31 (*)    All other components within normal limits  URINALYSIS, ROUTINE W REFLEX MICROSCOPIC - Abnormal; Notable for the following components:   Color, Urine YELLOW (*)    APPearance CLEAR (*)    Glucose, UA >=500 (*)    All other components within normal limits  RESP PANEL BY RT-PCR (RSV, FLU A&B, COVID)  [id number]  ETHANOL  URINE DRUG SCREEN, QUALITATIVE ([facility] ONLY)  CBG MONITORING, ED  TROPONIN I (HIGH SENSITIVITY)     EKG  ED ECG REPORT I, Blake Divine, the attending physician, personally viewed and interpreted this ECG.   Date: 02/20/2022  EKG Time: 21:28  Rate: 106  Rhythm: sinus tachycardia  Axis: Normal  Intervals:none  ST&T Change: None  RADIOLOGY CT head reviewed and interpreted by me with no hemorrhage or midline shift.  PROCEDURES:  Critical Care performed: No  Procedures   MEDICATIONS ORDERED IN ED: Medications - No data to display   IMPRESSION / MDM / [place] / ED COURSE  I reviewed the triage vital signs and the nursing notes.                              72 y.o. female with past medical history of hypertension,  diabetes, anxiety, and B-cell lymphoma who presents to the ED complaining of altered mental status and somnolence at home, reported difficulty breathing with EMS.  Patient's presentation is most consistent with acute presentation with potential threat to life or bodily function.  Differential diagnosis includes, but is not limited to, stroke, intracranial hemorrhage, sepsis, pneumonia, UTI, electrolyte abnormality, anemia, hypercapnic respiratory failure, medication effect.  Patient quite somnolent on arrival but is arousable to voice and moves all 4 extremities with encouragement, no focal deficits noted.  She is tachycardic but not in any respiratory distress and maintaining oxygen saturations at 98% on room air, lungs are clear to auscultation bilaterally.  EKG shows sinus tachycardia with no ischemic changes, will further assess with CT head and chest x-ray in addition to labs.  Will check VBG for evidence of hypercapnic respiratory failure.  Labs are reassuring with mild leukocytosis but no significant anemia, electrolyte abnormality, or AKI.  LFTs and troponin are unremarkable, patient continues to arouse easily to voice and is protecting her airway.  Chest x-ray is unremarkable and VBG without significant hypercapnia.  Husband at bedside states that patient came home from work feeling tired, but after he came back from getting dinner she then seemed significant somnolent.  Urinalysis with no signs of infection, UDS is pending at this time.  Case discussed with hospitalist for admission.      FINAL CLINICAL IMPRESSION(S) / ED DIAGNOSES   Final diagnoses:  Altered mental status, unspecified altered mental status type     Rx / DC Orders   ED Discharge Orders     None        Note:  This document was prepared using Dragon voice recognition software and may include unintentional dictation errors.   Blake Divine, MD 02/20/22 2306

## 2022-02-20 NOTE — ED Notes (Signed)
Patient transported to CT 

## 2022-02-20 NOTE — ED Triage Notes (Signed)
Per EMS pt called for SOB and ems sts pt was lethargic but is answering questions. Pt has hx of COPD and cancer but is in remission. Pt is talking but keeping her eyes closed. Responses are slow and pt appears confused.

## 2022-02-21 DIAGNOSIS — G9341 Metabolic encephalopathy: Secondary | ICD-10-CM | POA: Diagnosis not present

## 2022-02-21 DIAGNOSIS — C8250 Diffuse follicle center lymphoma, unspecified site: Secondary | ICD-10-CM

## 2022-02-21 DIAGNOSIS — U071 COVID-19: Secondary | ICD-10-CM | POA: Diagnosis not present

## 2022-02-21 LAB — COMPREHENSIVE METABOLIC PANEL
ALT: 38 U/L (ref 0–44)
AST: 62 U/L — ABNORMAL HIGH (ref 15–41)
Albumin: 4 g/dL (ref 3.5–5.0)
Alkaline Phosphatase: 107 U/L (ref 38–126)
Anion gap: 7 (ref 5–15)
BUN: 14 mg/dL (ref 8–23)
CO2: 24 mmol/L (ref 22–32)
Calcium: 9 mg/dL (ref 8.9–10.3)
Chloride: 106 mmol/L (ref 98–111)
Creatinine, Ser: 0.82 mg/dL (ref 0.44–1.00)
GFR, Estimated: >60 mL/min (ref >60)
Glucose, Bld: 194 mg/dL — ABNORMAL HIGH (ref 70–99)
Potassium: 4.1 mmol/L (ref 3.5–5.1)
Sodium: 137 mmol/L (ref 135–145)
Total Bilirubin: 0.6 mg/dL (ref 0.3–1.2)
Total Protein: 7.3 g/dL (ref 6.5–8.1)

## 2022-02-21 LAB — CBC WITH DIFFERENTIAL/PLATELET
Abs Immature Granulocytes: 0.05 10*3/uL (ref 0.00–0.07)
Basophils Absolute: 0 10*3/uL (ref 0.0–0.1)
Basophils Relative: 0 %
Eosinophils Absolute: 0 10*3/uL (ref 0.0–0.5)
Eosinophils Relative: 0 %
HCT: 36.3 % (ref 36.0–46.0)
Hemoglobin: 11.8 g/dL — ABNORMAL LOW (ref 12.0–15.0)
Immature Granulocytes: 1 %
Lymphocytes Relative: 7 %
Lymphs Abs: 0.6 10*3/uL — ABNORMAL LOW (ref 0.7–4.0)
MCH: 30.1 pg (ref 26.0–34.0)
MCHC: 32.5 g/dL (ref 30.0–36.0)
MCV: 92.6 fL (ref 80.0–100.0)
Monocytes Absolute: 0.1 10*3/uL (ref 0.1–1.0)
Monocytes Relative: 1 %
Neutro Abs: 7.7 10*3/uL (ref 1.7–7.7)
Neutrophils Relative %: 91 %
Platelets: 180 10*3/uL (ref 150–400)
RBC: 3.92 MIL/uL (ref 3.87–5.11)
RDW: 13.7 % (ref 11.5–15.5)
WBC: 8.4 10*3/uL (ref 4.0–10.5)
nRBC: 0 % (ref 0.0–0.2)

## 2022-02-21 LAB — TROPONIN I (HIGH SENSITIVITY): Troponin I (High Sensitivity): 4 ng/L (ref <18)

## 2022-02-21 MED ORDER — NIRMATRELVIR/RITONAVIR (PAXLOVID) TABLET (RENAL DOSING)
2.0000 | ORAL_TABLET | Freq: Two times a day (BID) | ORAL | Status: DC
  Administered 2022-02-21: 2 via ORAL
  Filled 2022-02-21: qty 20

## 2022-02-21 MED ORDER — NIRMATRELVIR/RITONAVIR (PAXLOVID) TABLET (RENAL DOSING): 2.0000 | ORAL_TABLET | Freq: Two times a day (BID) | ORAL | 0 refills | Status: AC

## 2022-02-21 MED ORDER — ALBUTEROL SULFATE HFA 108 (90 BASE) MCG/ACT IN AERS: 1.0000 | INHALATION_SPRAY | Freq: Four times a day (QID) | RESPIRATORY_TRACT | 0 refills | Status: AC | PRN

## 2022-02-21 NOTE — ED Notes (Signed)
Pt placed on purewick 

## 2022-02-21 NOTE — ED Notes (Signed)
Went over d/c paperwork at this time with patient. Pt had no questions, comments or concerns after review and verbally understood them.

## 2022-02-21 NOTE — ED Notes (Signed)
Informed RN bed assigned 

## 2022-02-21 NOTE — Discharge Summary (Signed)
Physician Discharge Summary   Patient: Beth Stanley MRN: [id number] DOB: 04-22-1950  Admit date:     02/20/2022  Discharge date: 02/21/22  Discharge Physician: Sharen Hones   PCP: Rhea Bleacher, NP   Recommendations at discharge:    Hold Suboxone today, restart tomorrow twice a day for 3 days, then resume prior dose. Completed dose of Paxlovid. Follow-up with PCP in 1 week.  Discharge Diagnoses: Principal Problem:   Acute metabolic encephalopathy Active Problems:   Hypertension   Smoker   Follicular lymphoma ([place])   Chronic prescription opiate use - subxone Rx'd by Simona Huh, NP at [facility]   COVID-19 virus infection Asthma without exacerbation. Resolved Problems:   * No resolved hospital problems. *  Hospital Course: 72 year old female history of follicular lymphoma followed by [facility] oncology, hypertension, smoker, chronic opiate user, history of diffuse large cell B lymphoma status post stem cell transplant presents to the ER today with excessive somnolence.  Condition appears to be due to Suboxone use.  Patient was monitored overnight with Suboxone on hold, patient mental status has improved.  Patient also was found to have COVID, but he never developed hypoxemia.  She was briefly placed on oxygen overnight, but she has good saturation with oxygen was taken off today.  As a result, she does not need steroids.  She is treated with Paxlovid, will complete the course.  Assessment and Plan: * Acute metabolic encephalopathy Appears to be secondary to Suboxone.  Condition has improved after the monitor overnight.  Medically stable to be discharged, advised to reduce the dose of Suboxone from next few days.  Follicular lymphoma (HCC) Chronic. Follows with [facility] [facility].  Smoker Stable.  Hypertension Stable.  COVID-19 virus infection Started paxlovid, no hypoxemia.  Follow-up with PCP as outpatient.        Consultants: None Procedures performed:  None  Disposition: Home Diet recommendation:  Discharge Diet Orders (From admission, onward)     Start     Ordered   02/21/22 0000  Diet - low sodium heart healthy        02/21/22 1337           Cardiac diet DISCHARGE MEDICATION: Allergies as of 02/21/2022       Reactions   Aspirin    Stomach cramp   Latex Swelling   Other    Tape Rash   Makes Whelps on Skin Makes Whelps on Skin        Medication List     STOP taking these medications    carboxymethylcellulose 0.5 % Soln Commonly known as: REFRESH PLUS   dextromethorphan-guaiFENesin 30-600 MG 12hr tablet Commonly known as: MUCINEX DM   diclofenac Sodium 1 % Gel Commonly known as: VOLTAREN   freestyle lancets   ipratropium-albuterol 0.5-2.5 (3) MG/3ML Soln Commonly known as: DUONEB   magic mouthwash Soln   nicotine 21 mg/24hr patch Commonly known as: NICODERM CQ - dosed in mg/24 hours   polyethylene glycol 17 g packet Commonly known as: MIRALAX / GLYCOLAX   predniSONE 10 MG tablet Commonly known as: DELTASONE       TAKE these medications    acetaminophen 500 MG tablet Commonly known as: TYLENOL Take 1,000 mg by mouth every 6 (six) hours as needed.   albuterol 108 (90 Base) MCG/ACT inhaler Commonly known as: VENTOLIN HFA Inhale 1 puff into the lungs every 6 (six) hours as needed for wheezing.   Buprenorphine HCl-Naloxone HCl 8-2 MG Film Place 1 strip under the  tongue 3 (three) times daily.   Cholecalciferol 1.25 MG (50000 UT) capsule Take 1 tablet by mouth once a week.   cyanocobalamin 1000 MCG tablet Take 1 tablet by mouth daily.   estradiol 0.1 MG/GM vaginal cream Commonly known as: ESTRACE Place 1 Applicatorful vaginally daily.   ferrous sulfate 325 (65 FE) MG tablet Take 325 mg by mouth 3 (three) times a week. Monday. Weds, fri   fluticasone-salmeterol 250-50 MCG/ACT Aepb Commonly known as: ADVAIR Inhale 1 puff into the lungs 2 (two) times daily as needed.   gemfibrozil 600  MG tablet Commonly known as: LOPID Take 600 mg by mouth 2 (two) times daily before a meal.   Jardiance 25 MG Tabs tablet Generic drug: empagliflozin Take 25 mg by mouth daily.   magnesium oxide 400 (240 Mg) MG tablet Commonly known as: MAG-OX Take 1 tablet by mouth daily.   methocarbamol 500 MG tablet Commonly known as: ROBAXIN Take 500 mg by mouth 2 (two) times daily.   nirmatrelvir/ritonavir (renal dosing) 10 x 150 MG & 10 x 100MG Tabs Commonly known as: PAXLOVID Take 2 tablets by mouth 2 (two) times daily for 5 days. Complete the remaining dose   omeprazole 20 MG capsule Commonly known as: PRILOSEC Take 20 mg by mouth daily.   phenazopyridine 200 MG tablet Commonly known as: PYRIDIUM Take 200 mg by mouth 3 (three) times daily as needed for pain.   potassium chloride SA 20 MEQ tablet Commonly known as: KLOR-CON M Take 20 mEq by mouth daily.   ramipril 2.5 MG capsule Commonly known as: ALTACE Take 1 capsule (2.5 mg total) by mouth daily.   sertraline 100 MG tablet Commonly known as: ZOLOFT TAKE ONE (1) TABLET EACH DAY What changed:  how much to take how to take this when to take this additional instructions   Trelegy Ellipta 100-62.5-25 MCG/ACT Aepb Generic drug: Fluticasone-Umeclidin-Vilant Inhale 1 puff into the lungs daily.   valACYclovir 1000 MG tablet Commonly known as: VALTREX Take 1,000 mg by mouth 2 (two) times daily.        Follow-up Information     Rhea Bleacher, NP Follow up in 1 week(s).   Contact information: [address] [place] [address] [phone]                Discharge Exam: There were no vitals filed for this visit. General exam: Appears calm and comfortable  Respiratory system: Clear to auscultation. Respiratory effort normal. Cardiovascular system: S1 & S2 heard, RRR. No JVD, murmurs, rubs, gallops or clicks. No pedal edema. Gastrointestinal system: Abdomen is nondistended, soft and nontender. No organomegaly  or masses felt. Normal bowel sounds heard. Central nervous system: Alert and oriented. No focal neurological deficits. Extremities: Symmetric 5 x 5 power. Skin: No rashes, lesions or ulcers Psychiatry: Judgement and insight appear normal. Mood & affect appropriate.    Condition at discharge: good  The results of significant diagnostics from this hospitalization (including imaging, microbiology, ancillary and laboratory) are listed below for reference.   Imaging Studies: CT Head Wo Contrast  Result Date: 02/20/2022 CLINICAL DATA:  Mental status change EXAM: CT HEAD WITHOUT CONTRAST TECHNIQUE: Contiguous axial images were obtained from the base of the skull through the vertex without intravenous contrast. RADIATION DOSE REDUCTION: This exam was performed according to the departmental dose-optimization program which includes automated exposure control, adjustment of the mA and/or kV according to patient size and/or use of iterative reconstruction technique. COMPARISON:  MRI brain 11/16/2020, CT brain  11/15/2020 FINDINGS: Brain: No acute territorial infarction, hemorrhage or intracranial mass. Mild atrophy. Extensive confluent white matter hypodensity without significant change. Chronic appearing lacunar infarct left basal ganglia. Stable ventricle size. Vascular: No hyperdense vessels.  Carotid vascular calcification Skull: Normal. Negative for fracture or focal lesion. Sinuses/Orbits: Moderate mucosal thickening in the right ethmoid maxillary and frontal sinuses. Other: None. IMPRESSION: 1. No CT evidence for acute intracranial abnormality. 2. Atrophy and chronic small vessel ischemic changes of the white matter. Electronically Signed   By: Donavan Foil M.D.   On: 02/20/2022 22:26   DG Chest Portable 1 View  Result Date: 02/20/2022 CLINICAL DATA:  Shortness of breath EXAM: PORTABLE CHEST 1 VIEW COMPARISON:  04/19/2021 FINDINGS: Right-sided central venous port tip at the right atrium. No acute airspace  disease. Atelectasis or scar at the left base. Stable cardiomediastinal silhouette. IMPRESSION: No active disease. Atelectasis or scar at the left base. Electronically Signed   By: Donavan Foil M.D.   On: 02/20/2022 21:51    Microbiology: Results for orders placed or performed during the hospital encounter of 02/20/22  Resp panel by RT-PCR (RSV, Flu A&B, Covid) Anterior Nasal Swab     Status: Abnormal   Collection Time: 02/20/22 10:43 PM   Specimen: Anterior Nasal Swab  Result Value Ref Range Status   SARS Coronavirus 2 by RT PCR POSITIVE (A) NEGATIVE Final    Comment: (NOTE) SARS-CoV-2 target nucleic acids are DETECTED.  The SARS-CoV-2 RNA is generally detectable in upper respiratory specimens during the acute phase of infection. Positive results are indicative of the presence of the identified virus, but do not rule out bacterial infection or co-infection with other pathogens not detected by the test. Clinical correlation with patient history and other diagnostic information is necessary to determine patient infection status. The expected result is Negative.  Fact Sheet for Patients: EntrepreneurPulse.com.au  Fact Sheet for Healthcare Providers: IncredibleEmployment.be  This test is not yet approved or cleared by the [place] FDA and  has been authorized for detection and/or diagnosis of SARS-CoV-2 by FDA under an Emergency Use Authorization (EUA).  This EUA will remain in effect (meaning this test can be used) for the duration of  the COVID-19 declaration under Section 564(b)(1) of the A ct, 21 U.S.C. section 360bbb-3(b)(1), unless the authorization is terminated or revoked sooner.     Influenza A by PCR NEGATIVE NEGATIVE Final   Influenza B by PCR NEGATIVE NEGATIVE Final    Comment: (NOTE) The Xpert Xpress SARS-CoV-2/FLU/RSV plus assay is intended as an aid in the diagnosis of influenza from Nasopharyngeal swab specimens and should  not be used as a sole basis for treatment. Nasal washings and aspirates are unacceptable for Xpert Xpress SARS-CoV-2/FLU/RSV testing.  Fact Sheet for Patients: EntrepreneurPulse.com.au  Fact Sheet for Healthcare Providers: IncredibleEmployment.be  This test is not yet approved or cleared by the [place] FDA and has been authorized for detection and/or diagnosis of SARS-CoV-2 by FDA under an Emergency Use Authorization (EUA). This EUA will remain in effect (meaning this test can be used) for the duration of the COVID-19 declaration under Section 564(b)(1) of the Act, 21 U.S.C. section 360bbb-3(b)(1), unless the authorization is terminated or revoked.     Resp Syncytial Virus by PCR NEGATIVE NEGATIVE Final    Comment: (NOTE) Fact Sheet for Patients: EntrepreneurPulse.com.au  Fact Sheet for Healthcare Providers: IncredibleEmployment.be  This test is not yet approved or cleared by the [place] FDA and has been authorized for detection and/or diagnosis of  SARS-CoV-2 by FDA under an Emergency Use Authorization (EUA). This EUA will remain in effect (meaning this test can be used) for the duration of the COVID-19 declaration under Section 564(b)(1) of the Act, 21 U.S.C. section 360bbb-3(b)(1), unless the authorization is terminated or revoked.  Performed at [facility], [address] [place], [place] [address]     Labs: CBC: Recent Labs  Lab 02/20/22 2141 02/21/22 0510  WBC 11.3* 8.4  NEUTROABS 8.8* 7.7  HGB 12.1 11.8*  HCT 37.6 36.3  MCV 94.0 92.6  PLT 175 [id number]   Basic Metabolic Panel: Recent Labs  Lab 02/20/22 2141 02/21/22 0510  NA 136 137  K 4.4 4.1  CL 103 106  CO2 22 24  GLUCOSE 126* 194*  BUN 13 14  CREATININE 0.89 0.82  CALCIUM 9.2 9.0   Liver Function Tests: Recent Labs  Lab 02/20/22 2141 02/21/22 0510  AST 32 62*  ALT 17 38  ALKPHOS 98 107  BILITOT  0.7 0.6  PROT 7.7 7.3  ALBUMIN 4.3 4.0   CBG: Recent Labs  Lab 02/20/22 2137  GLUCAP 94    Discharge time spent: greater than 30 minutes.  Signed: Sharen Hones, MD [facility] 02/21/2022

## 2022-02-21 NOTE — ED Notes (Signed)
The pt is more alert at this time. The Pt is responsive to voice and able to converse, answering questions correctly at this time. Per the pt's husband these episodes of sleeping for long periods of time have been recurring every five to six days. The husband reports the pt sleeps 16hrs at a time and is very difficult to arouse while sleeping. The pt confirms the husband statement stating " I'm just tired a lot right now." The pt's morning labs have been collected by this RN, close monitoring continued.

## 2022-02-21 NOTE — ED Notes (Signed)
Pt was unable to eat breakfast due to sleeping. Pt struggled to stay awake to pt medication, which was only 2 pills.

## 2022-02-27 DIAGNOSIS — B379 Candidiasis, unspecified: Secondary | ICD-10-CM | POA: Diagnosis not present

## 2022-02-27 DIAGNOSIS — Z09 Encounter for follow-up examination after completed treatment for conditions other than malignant neoplasm: Secondary | ICD-10-CM | POA: Diagnosis not present

## 2022-02-27 DIAGNOSIS — E119 Type 2 diabetes mellitus without complications: Secondary | ICD-10-CM | POA: Diagnosis not present

## 2022-02-27 DIAGNOSIS — U099 Post covid-19 condition, unspecified: Secondary | ICD-10-CM | POA: Diagnosis not present

## 2022-02-27 DIAGNOSIS — R252 Cramp and spasm: Secondary | ICD-10-CM | POA: Diagnosis not present

## 2022-02-27 DIAGNOSIS — R8290 Unspecified abnormal findings in urine: Secondary | ICD-10-CM | POA: Diagnosis not present

## 2022-02-27 DIAGNOSIS — Z20828 Contact with and (suspected) exposure to other viral communicable diseases: Secondary | ICD-10-CM | POA: Diagnosis not present

## 2022-02-27 DIAGNOSIS — R5383 Other fatigue: Secondary | ICD-10-CM | POA: Diagnosis not present

## 2022-02-28 DIAGNOSIS — M542 Cervicalgia: Secondary | ICD-10-CM | POA: Diagnosis not present

## 2022-02-28 DIAGNOSIS — M4722 Other spondylosis with radiculopathy, cervical region: Secondary | ICD-10-CM | POA: Diagnosis not present

## 2022-03-03 DIAGNOSIS — E559 Vitamin D deficiency, unspecified: Secondary | ICD-10-CM | POA: Diagnosis not present

## 2022-03-03 DIAGNOSIS — F1120 Opioid dependence, uncomplicated: Secondary | ICD-10-CM | POA: Diagnosis not present

## 2022-03-03 DIAGNOSIS — C8590 Non-Hodgkin lymphoma, unspecified, unspecified site: Secondary | ICD-10-CM | POA: Diagnosis not present

## 2022-03-03 DIAGNOSIS — Z6823 Body mass index (BMI) 23.0-23.9, adult: Secondary | ICD-10-CM | POA: Diagnosis not present

## 2022-03-03 DIAGNOSIS — E119 Type 2 diabetes mellitus without complications: Secondary | ICD-10-CM | POA: Diagnosis not present

## 2022-03-03 DIAGNOSIS — Z5320 Procedure and treatment not carried out because of patient's decision for unspecified reasons: Secondary | ICD-10-CM | POA: Diagnosis not present

## 2022-03-03 DIAGNOSIS — Z79899 Other long term (current) drug therapy: Secondary | ICD-10-CM | POA: Diagnosis not present

## 2022-03-03 DIAGNOSIS — F17210 Nicotine dependence, cigarettes, uncomplicated: Secondary | ICD-10-CM | POA: Diagnosis not present

## 2022-03-03 DIAGNOSIS — Z9181 History of falling: Secondary | ICD-10-CM | POA: Diagnosis not present

## 2022-03-04 ENCOUNTER — Emergency Department: Payer: PPO

## 2022-03-04 ENCOUNTER — Other Ambulatory Visit: Payer: Self-pay

## 2022-03-04 ENCOUNTER — Inpatient Hospital Stay
Admission: EM | Admit: 2022-03-04 | Discharge: 2022-03-06 | DRG: 871 | Disposition: A | Discharge status: Home Health | Payer: PPO | Attending: Internal Medicine | Admitting: Internal Medicine

## 2022-03-04 ENCOUNTER — Encounter: Payer: Self-pay | Admitting: Emergency Medicine

## 2022-03-04 DIAGNOSIS — Z79891 Long term (current) use of opiate analgesic: Secondary | ICD-10-CM | POA: Diagnosis not present

## 2022-03-04 DIAGNOSIS — G9341 Metabolic encephalopathy: Secondary | ICD-10-CM | POA: Diagnosis present

## 2022-03-04 DIAGNOSIS — Z1152 Encounter for screening for COVID-19: Secondary | ICD-10-CM | POA: Diagnosis not present

## 2022-03-04 DIAGNOSIS — Z888 Allergy status to other drugs, medicaments and biological substances status: Secondary | ICD-10-CM | POA: Diagnosis not present

## 2022-03-04 DIAGNOSIS — Z8616 Personal history of COVID-19: Secondary | ICD-10-CM

## 2022-03-04 DIAGNOSIS — Z79899 Other long term (current) drug therapy: Secondary | ICD-10-CM | POA: Diagnosis not present

## 2022-03-04 DIAGNOSIS — Z7951 Long term (current) use of inhaled steroids: Secondary | ICD-10-CM

## 2022-03-04 DIAGNOSIS — R4182 Altered mental status, unspecified: Secondary | ICD-10-CM | POA: Diagnosis not present

## 2022-03-04 DIAGNOSIS — R9431 Abnormal electrocardiogram [ECG] [EKG]: Secondary | ICD-10-CM | POA: Diagnosis not present

## 2022-03-04 DIAGNOSIS — Z8051 Family history of malignant neoplasm of kidney: Secondary | ICD-10-CM

## 2022-03-04 DIAGNOSIS — F17210 Nicotine dependence, cigarettes, uncomplicated: Secondary | ICD-10-CM | POA: Diagnosis not present

## 2022-03-04 DIAGNOSIS — I10 Essential (primary) hypertension: Secondary | ICD-10-CM | POA: Diagnosis present

## 2022-03-04 DIAGNOSIS — Z833 Family history of diabetes mellitus: Secondary | ICD-10-CM | POA: Diagnosis not present

## 2022-03-04 DIAGNOSIS — Z8572 Personal history of non-Hodgkin lymphomas: Secondary | ICD-10-CM

## 2022-03-04 DIAGNOSIS — K219 Gastro-esophageal reflux disease without esophagitis: Secondary | ICD-10-CM | POA: Diagnosis not present

## 2022-03-04 DIAGNOSIS — Z91040 Latex allergy status: Secondary | ICD-10-CM

## 2022-03-04 DIAGNOSIS — Z886 Allergy status to analgesic agent status: Secondary | ICD-10-CM | POA: Diagnosis not present

## 2022-03-04 DIAGNOSIS — J432 Centrilobular emphysema: Secondary | ICD-10-CM | POA: Diagnosis not present

## 2022-03-04 DIAGNOSIS — R8290 Unspecified abnormal findings in urine: Secondary | ICD-10-CM | POA: Diagnosis present

## 2022-03-04 DIAGNOSIS — Z801 Family history of malignant neoplasm of trachea, bronchus and lung: Secondary | ICD-10-CM | POA: Diagnosis not present

## 2022-03-04 DIAGNOSIS — F32A Depression, unspecified: Secondary | ICD-10-CM | POA: Diagnosis present

## 2022-03-04 DIAGNOSIS — C8290 Follicular lymphoma, unspecified, unspecified site: Secondary | ICD-10-CM | POA: Diagnosis present

## 2022-03-04 DIAGNOSIS — R6510 Systemic inflammatory response syndrome (SIRS) of non-infectious origin without acute organ dysfunction: Secondary | ICD-10-CM

## 2022-03-04 DIAGNOSIS — I700 Atherosclerosis of aorta: Secondary | ICD-10-CM | POA: Diagnosis not present

## 2022-03-04 DIAGNOSIS — A419 Sepsis, unspecified organism: Secondary | ICD-10-CM | POA: Diagnosis not present

## 2022-03-04 DIAGNOSIS — R0689 Other abnormalities of breathing: Secondary | ICD-10-CM | POA: Diagnosis not present

## 2022-03-04 DIAGNOSIS — E119 Type 2 diabetes mellitus without complications: Secondary | ICD-10-CM | POA: Diagnosis present

## 2022-03-04 DIAGNOSIS — M545 Low back pain, unspecified: Secondary | ICD-10-CM | POA: Diagnosis not present

## 2022-03-04 DIAGNOSIS — Z9484 Stem cells transplant status: Secondary | ICD-10-CM | POA: Diagnosis not present

## 2022-03-04 DIAGNOSIS — N390 Urinary tract infection, site not specified: Secondary | ICD-10-CM | POA: Diagnosis not present

## 2022-03-04 DIAGNOSIS — Z9221 Personal history of antineoplastic chemotherapy: Secondary | ICD-10-CM

## 2022-03-04 DIAGNOSIS — R001 Bradycardia, unspecified: Secondary | ICD-10-CM | POA: Diagnosis not present

## 2022-03-04 DIAGNOSIS — M48061 Spinal stenosis, lumbar region without neurogenic claudication: Secondary | ICD-10-CM | POA: Diagnosis not present

## 2022-03-04 LAB — COMPREHENSIVE METABOLIC PANEL
ALT: 16 U/L (ref 0–44)
AST: 20 U/L (ref 15–41)
Albumin: 4.2 g/dL (ref 3.5–5.0)
Alkaline Phosphatase: 90 U/L (ref 38–126)
Anion gap: 9 (ref 5–15)
BUN: 13 mg/dL (ref 8–23)
CO2: 24 mmol/L (ref 22–32)
Calcium: 9.3 mg/dL (ref 8.9–10.3)
Chloride: 102 mmol/L (ref 98–111)
Creatinine, Ser: 0.92 mg/dL (ref 0.44–1.00)
GFR, Estimated: >60 mL/min (ref >60)
Glucose, Bld: 113 mg/dL — ABNORMAL HIGH (ref 70–99)
Potassium: 4.3 mmol/L (ref 3.5–5.1)
Sodium: 135 mmol/L (ref 135–145)
Total Bilirubin: 0.5 mg/dL (ref 0.3–1.2)
Total Protein: 8 g/dL (ref 6.5–8.1)

## 2022-03-04 LAB — PROTIME-INR
INR: 1 (ref 0.8–1.2)
Prothrombin Time: 13.4 seconds (ref 11.4–15.2)

## 2022-03-04 LAB — CBC WITH DIFFERENTIAL/PLATELET
Abs Immature Granulocytes: 0.03 10*3/uL (ref 0.00–0.07)
Basophils Absolute: 0.1 10*3/uL (ref 0.0–0.1)
Basophils Relative: 1 %
Eosinophils Absolute: 0.1 10*3/uL (ref 0.0–0.5)
Eosinophils Relative: 1 %
HCT: 37.4 % (ref 36.0–46.0)
Hemoglobin: 12.2 g/dL (ref 12.0–15.0)
Immature Granulocytes: 0 %
Lymphocytes Relative: 17 %
Lymphs Abs: 1.8 10*3/uL (ref 0.7–4.0)
MCH: 30.3 pg (ref 26.0–34.0)
MCHC: 32.6 g/dL (ref 30.0–36.0)
MCV: 93 fL (ref 80.0–100.0)
Monocytes Absolute: 0.9 10*3/uL (ref 0.1–1.0)
Monocytes Relative: 8 %
Neutro Abs: 7.9 10*3/uL — ABNORMAL HIGH (ref 1.7–7.7)
Neutrophils Relative %: 73 %
Platelets: 188 10*3/uL (ref 150–400)
RBC: 4.02 MIL/uL (ref 3.87–5.11)
RDW: 14.1 % (ref 11.5–15.5)
WBC: 10.7 10*3/uL — ABNORMAL HIGH (ref 4.0–10.5)
nRBC: 0 % (ref 0.0–0.2)

## 2022-03-04 LAB — LACTIC ACID, PLASMA: Lactic Acid, Venous: 1.3 mmol/L (ref 0.5–1.9)

## 2022-03-04 MED ORDER — SODIUM CHLORIDE 0.9 % IV BOLUS
1000.0000 mL | Freq: Once | INTRAVENOUS | Status: AC
  Administered 2022-03-04: 1000 mL via INTRAVENOUS

## 2022-03-04 NOTE — ED Notes (Signed)
1st set of blood cultures drawn by Park Pope RN

## 2022-03-04 NOTE — ED Triage Notes (Addendum)
Pt arrived via [facility] from home, husband called because pt was acting lethargic and slow to respond, pt was seen for UTI 2 weeks ago and taking meds, but pt reported that she wasn't responding.  Temp at home was 102 per EMS.  Pt has hx of lymphoma, not currently receiving any treatment.

## 2022-03-04 NOTE — ED Provider Notes (Signed)
[facility] Provider Note    Event Date/Time   First MD Initiated Contact with Patient 03/04/22 2258     (approximate)   History   Altered Mental Status   HPI  Beth Stanley is a 72 y.o. female   Past medical history of chronic lower back pain on Suboxone, non-Hodgkin's lymphoma no active treatment, diabetes, UTIs who presents to the emergency department with progressively worsening lethargy throughout the day, cough/nasal congestion over the last several days, and dysuria.  The husband notes that this patient was crying out in pain with urination due to burning over the last several days, as noticed a productive cough, and patient complains of a runny nose.  She had normal behaviors this morning but progressively throughout the day became more more somnolent and tired appearing, confused per patient's husband.  Of note, was recently discharged earlier in February for altered mental status thought likely to be from her Suboxone use.  She had a fall approximately 1 week ago without head strike but since then her lower back has been hurting more than normal.  Independent Historian contributed to assessment above: Husband  External Medical Documents Reviewed: Discharge summary from 02/21/2022 for altered mental status      Physical Exam   Triage Vital Signs: ED Triage Vitals  Enc Vitals Group     BP 03/04/22 2156 (!) 107/49     Pulse Rate 03/04/22 2156 92     Resp 03/04/22 2156 20     Temp 03/04/22 2156 99.6 F (37.6 C)     Temp Source 03/04/22 2156 Oral     SpO2 03/04/22 2156 92 %     Weight 03/04/22 2213 130 lb (59 kg)     Height 03/04/22 2158 5' 2"$  (1.575 m)     Head Circumference --      Peak Flow --      Pain Score 03/04/22 2158 0     Pain Loc --      Pain Edu? --      Excl. in [place]? --     Most recent vital signs: Vitals:   03/05/22 0130 03/05/22 0230  BP: (!) 115/56 (!) 113/50  Pulse: 75 79  Resp: 14 13  Temp:    SpO2: 93% 90%     General: Awake, no distress.  CV:  Good peripheral perfusion.  Resp:  Normal effort.  Abd:  No distention.  Other:  Somnolent but arousable to verbal stimulus and able to hold a conversation answering questions appropriately moving all extremities and following commands.  However with prolonged discussion she falls back asleep.  Her pupils are midrange and reactive.  There are no signs of trauma.  She has lower lumbar tenderness to palpation without step-off or deformity.  Sensation is intact everywhere.  She has no facial asymmetry or dysarthria.  Abdomen soft and nontender lungs are clear   ED Results / Procedures / Treatments   Labs (all labs ordered are listed, but only abnormal results are displayed) Labs Reviewed  COMPREHENSIVE METABOLIC PANEL - Abnormal; Notable for the following components:      Result Value   Glucose, Bld 113 (*)    All other components within normal limits  CBC WITH DIFFERENTIAL/PLATELET - Abnormal; Notable for the following components:   WBC 10.7 (*)    Neutro Abs 7.9 (*)    All other components within normal limits  RESP PANEL BY RT-PCR (RSV, FLU A&B, COVID)  [id number]  CULTURE, BLOOD (  ROUTINE X 2)  CULTURE, BLOOD (ROUTINE X 2)  LACTIC ACID, PLASMA  PROTIME-INR  LACTIC ACID, PLASMA  URINALYSIS, ROUTINE W REFLEX MICROSCOPIC  URINE DRUG SCREEN, QUALITATIVE ([facility] ONLY)     I ordered and reviewed the above labs they are notable for mildly elevated white blood cell count of 10.7  EKG  ED ECG REPORT I, Lucillie Garfinkel, the attending physician, personally viewed and interpreted this ECG.   Date: 03/04/2022  EKG Time: 0002  Rate: 80  Rhythm: nsr  Axis: nl  Intervals:none  ST&T Change: no stemi    RADIOLOGY I independently reviewed and interpreted CT of the head see no obvious bleeding or midline shift   PROCEDURES:  Critical Care performed: No  Procedures   MEDICATIONS ORDERED IN ED: Medications  sodium chloride 0.9 % bolus 1,000 mL (0  mLs Intravenous Stopped 03/05/22 0039)  0.9 %  sodium chloride infusion ( Intravenous New Bag/Given 03/05/22 0104)    External physician / consultants:  I spoke with hospitalist for admission and regarding care plan for this patient.   IMPRESSION / MDM / ASSESSMENT AND PLAN / ED COURSE  I reviewed the triage vital signs and the nursing notes.                                Patient's presentation is most consistent with acute presentation with potential threat to life or bodily function.  Differential diagnosis includes, but is not limited to, upper respiratory tract infection, viral URI, bacterial pneumonia, urinary tract infection, lumbar fracture, head bleed, effects of medication Suboxone sepsis   The patient is on the cardiac monitor to evaluate for evidence of arrhythmia and/or significant heart rate changes.  MDM: This is a patient with altered mental status somnolence with symptoms of both respiratory infection and urinary tract infection, will assess for both with basic labs, chest x-ray, viral swabs, urinalysis.  She also had a similar occurrence of altered mental status and somnolence thought likely due to her Suboxone use, which remains on the differential as well.  Given her somnolence altered mental status from baseline, if she does not improve during her workup and evaluation in the emergency department she will be admitted.  For recent fall and altered mental status obtain CT head and CT lumbar spine where she is having pain to rule out fractures or head bleed.   Imaging negative, infectious workup negative though she is still pending UA and U tox.  Patient continues to be altered somnolent but arousable and stable after soft blood pressures got 2 L and normalized.  Admit for altered mental status, urinalysis pending.        FINAL CLINICAL IMPRESSION(S) / ED DIAGNOSES   Final diagnoses:  Altered mental status, unspecified altered mental status type     Rx / DC  Orders   ED Discharge Orders     None        Note:  This document was prepared using Dragon voice recognition software and may include unintentional dictation errors.    Lucillie Garfinkel, MD 03/05/22 [phone]

## 2022-03-04 NOTE — ED Notes (Signed)
Patient transported to CT 

## 2022-03-05 ENCOUNTER — Encounter: Payer: Self-pay | Admitting: Internal Medicine

## 2022-03-05 DIAGNOSIS — Z8616 Personal history of COVID-19: Secondary | ICD-10-CM | POA: Diagnosis not present

## 2022-03-05 DIAGNOSIS — R6510 Systemic inflammatory response syndrome (SIRS) of non-infectious origin without acute organ dysfunction: Secondary | ICD-10-CM

## 2022-03-05 DIAGNOSIS — Z8572 Personal history of non-Hodgkin lymphomas: Secondary | ICD-10-CM | POA: Diagnosis not present

## 2022-03-05 DIAGNOSIS — E119 Type 2 diabetes mellitus without complications: Secondary | ICD-10-CM | POA: Diagnosis present

## 2022-03-05 DIAGNOSIS — F17210 Nicotine dependence, cigarettes, uncomplicated: Secondary | ICD-10-CM | POA: Diagnosis present

## 2022-03-05 DIAGNOSIS — Z801 Family history of malignant neoplasm of trachea, bronchus and lung: Secondary | ICD-10-CM | POA: Diagnosis not present

## 2022-03-05 DIAGNOSIS — Z888 Allergy status to other drugs, medicaments and biological substances status: Secondary | ICD-10-CM | POA: Diagnosis not present

## 2022-03-05 DIAGNOSIS — I10 Essential (primary) hypertension: Secondary | ICD-10-CM | POA: Diagnosis present

## 2022-03-05 DIAGNOSIS — Z91040 Latex allergy status: Secondary | ICD-10-CM | POA: Diagnosis not present

## 2022-03-05 DIAGNOSIS — R8290 Unspecified abnormal findings in urine: Secondary | ICD-10-CM | POA: Diagnosis present

## 2022-03-05 DIAGNOSIS — Z8051 Family history of malignant neoplasm of kidney: Secondary | ICD-10-CM | POA: Diagnosis not present

## 2022-03-05 DIAGNOSIS — Z886 Allergy status to analgesic agent status: Secondary | ICD-10-CM | POA: Diagnosis not present

## 2022-03-05 DIAGNOSIS — R4182 Altered mental status, unspecified: Secondary | ICD-10-CM

## 2022-03-05 DIAGNOSIS — J432 Centrilobular emphysema: Secondary | ICD-10-CM | POA: Diagnosis present

## 2022-03-05 DIAGNOSIS — Z833 Family history of diabetes mellitus: Secondary | ICD-10-CM | POA: Diagnosis not present

## 2022-03-05 DIAGNOSIS — G9341 Metabolic encephalopathy: Secondary | ICD-10-CM | POA: Diagnosis present

## 2022-03-05 DIAGNOSIS — N390 Urinary tract infection, site not specified: Secondary | ICD-10-CM | POA: Diagnosis present

## 2022-03-05 DIAGNOSIS — A419 Sepsis, unspecified organism: Secondary | ICD-10-CM | POA: Diagnosis present

## 2022-03-05 DIAGNOSIS — Z9484 Stem cells transplant status: Secondary | ICD-10-CM | POA: Diagnosis not present

## 2022-03-05 DIAGNOSIS — Z79899 Other long term (current) drug therapy: Secondary | ICD-10-CM | POA: Diagnosis not present

## 2022-03-05 DIAGNOSIS — F32A Depression, unspecified: Secondary | ICD-10-CM | POA: Diagnosis present

## 2022-03-05 DIAGNOSIS — Z79891 Long term (current) use of opiate analgesic: Secondary | ICD-10-CM | POA: Diagnosis not present

## 2022-03-05 DIAGNOSIS — Z9221 Personal history of antineoplastic chemotherapy: Secondary | ICD-10-CM | POA: Diagnosis not present

## 2022-03-05 DIAGNOSIS — Z1152 Encounter for screening for COVID-19: Secondary | ICD-10-CM | POA: Diagnosis not present

## 2022-03-05 DIAGNOSIS — Z7951 Long term (current) use of inhaled steroids: Secondary | ICD-10-CM | POA: Diagnosis not present

## 2022-03-05 DIAGNOSIS — K219 Gastro-esophageal reflux disease without esophagitis: Secondary | ICD-10-CM | POA: Diagnosis present

## 2022-03-05 LAB — URINALYSIS, ROUTINE W REFLEX MICROSCOPIC
Bilirubin Urine: NEGATIVE
Glucose, UA: >=500 mg/dL — AB
Ketones, ur: NEGATIVE mg/dL
Nitrite: NEGATIVE
Protein, ur: 30 mg/dL — AB
Specific Gravity, Urine: 1.005 (ref 1.005–1.030)
WBC, UA: >50 WBC/hpf (ref 0–5)
pH: 5 (ref 5.0–8.0)

## 2022-03-05 LAB — VITAMIN D 25 HYDROXY (VIT D DEFICIENCY, FRACTURES): Vit D, 25-Hydroxy: 84.2 ng/mL (ref 30–100)

## 2022-03-05 LAB — URINE DRUG SCREEN, QUALITATIVE (ARMC ONLY)
Amphetamines, Ur Screen: NOT DETECTED
Barbiturates, Ur Screen: NOT DETECTED
Benzodiazepine, Ur Scrn: NOT DETECTED
Cannabinoid 50 Ng, Ur ~~LOC~~: NOT DETECTED
Cocaine Metabolite,Ur ~~LOC~~: NOT DETECTED
MDMA (Ecstasy)Ur Screen: NOT DETECTED
Methadone Scn, Ur: NOT DETECTED
Opiate, Ur Screen: NOT DETECTED
Phencyclidine (PCP) Ur S: NOT DETECTED
Tricyclic, Ur Screen: NOT DETECTED

## 2022-03-05 LAB — CBC
HCT: 31.3 % — ABNORMAL LOW (ref 36.0–46.0)
Hemoglobin: 9.9 g/dL — ABNORMAL LOW (ref 12.0–15.0)
MCH: 29.8 pg (ref 26.0–34.0)
MCHC: 31.6 g/dL (ref 30.0–36.0)
MCV: 94.3 fL (ref 80.0–100.0)
Platelets: 149 10*3/uL — ABNORMAL LOW (ref 150–400)
RBC: 3.32 MIL/uL — ABNORMAL LOW (ref 3.87–5.11)
RDW: 14.2 % (ref 11.5–15.5)
WBC: 6.8 10*3/uL (ref 4.0–10.5)
nRBC: 0 % (ref 0.0–0.2)

## 2022-03-05 LAB — GLUCOSE, CAPILLARY
Glucose-Capillary: 107 mg/dL — ABNORMAL HIGH (ref 70–99)
Glucose-Capillary: 108 mg/dL — ABNORMAL HIGH (ref 70–99)

## 2022-03-05 LAB — CREATININE, SERUM
Creatinine, Ser: 0.8 mg/dL (ref 0.44–1.00)
GFR, Estimated: >60 mL/min (ref >60)

## 2022-03-05 LAB — HEMOGLOBIN A1C
Hgb A1c MFr Bld: 6.1 % — ABNORMAL HIGH (ref 4.8–5.6)
Mean Plasma Glucose: 128.37 mg/dL

## 2022-03-05 LAB — RESP PANEL BY RT-PCR (RSV, FLU A&B, COVID)  RVPGX2
Influenza A by PCR: NEGATIVE
Influenza B by PCR: NEGATIVE
Resp Syncytial Virus by PCR: NEGATIVE
SARS Coronavirus 2 by RT PCR: NEGATIVE

## 2022-03-05 LAB — PROCALCITONIN: Procalcitonin: <0.1 ng/mL

## 2022-03-05 LAB — CORTISOL-AM, BLOOD: Cortisol - AM: 8.4 ug/dL (ref 6.7–22.6)

## 2022-03-05 LAB — CBG MONITORING, ED: Glucose-Capillary: 147 mg/dL — ABNORMAL HIGH (ref 70–99)

## 2022-03-05 LAB — AMMONIA: Ammonia: 22 umol/L (ref 9–35)

## 2022-03-05 LAB — LACTIC ACID, PLASMA: Lactic Acid, Venous: 1.1 mmol/L (ref 0.5–1.9)

## 2022-03-05 LAB — TSH: TSH: 1.4 u[IU]/mL (ref 0.350–4.500)

## 2022-03-05 MED ORDER — LACTATED RINGERS IV SOLN: INTRAVENOUS | Status: DC

## 2022-03-05 MED ORDER — POTASSIUM CHLORIDE CRYS ER 20 MEQ PO TBCR
20.0000 meq | EXTENDED_RELEASE_TABLET | Freq: Every day | ORAL | Status: DC
  Administered 2022-03-06: 20 meq via ORAL
  Filled 2022-03-05: qty 1

## 2022-03-05 MED ORDER — SERTRALINE HCL 50 MG PO TABS
150.0000 mg | ORAL_TABLET | Freq: Every day | ORAL | Status: DC
  Administered 2022-03-05 – 2022-03-06 (×2): 150 mg via ORAL
  Filled 2022-03-05 (×2): qty 3

## 2022-03-05 MED ORDER — VALACYCLOVIR HCL 500 MG PO TABS
1000.0000 mg | ORAL_TABLET | Freq: Every day | ORAL | Status: DC
  Administered 2022-03-05 – 2022-03-06 (×2): 1000 mg via ORAL
  Filled 2022-03-05 (×3): qty 2

## 2022-03-05 MED ORDER — ALBUTEROL SULFATE HFA 108 (90 BASE) MCG/ACT IN AERS: 1.0000 | INHALATION_SPRAY | Freq: Four times a day (QID) | RESPIRATORY_TRACT | Status: DC | PRN

## 2022-03-05 MED ORDER — INSULIN ASPART 100 UNIT/ML IJ SOLN
0.0000 [IU] | Freq: Three times a day (TID) | INTRAMUSCULAR | Status: DC
  Administered 2022-03-05: 1 [IU] via SUBCUTANEOUS
  Filled 2022-03-05 (×2): qty 1

## 2022-03-05 MED ORDER — ACETAMINOPHEN 650 MG RE SUPP: 650.0000 mg | Freq: Four times a day (QID) | RECTAL | Status: DC | PRN

## 2022-03-05 MED ORDER — SODIUM CHLORIDE 0.9 % IV SOLN: Freq: Once | INTRAVENOUS | Status: AC

## 2022-03-05 MED ORDER — ONDANSETRON HCL 4 MG/2ML IJ SOLN: 4.0000 mg | Freq: Four times a day (QID) | INTRAMUSCULAR | Status: DC | PRN

## 2022-03-05 MED ORDER — NICOTINE 14 MG/24HR TD PT24
14.0000 mg | MEDICATED_PATCH | Freq: Every day | TRANSDERMAL | Status: DC
  Administered 2022-03-05 – 2022-03-06 (×2): 14 mg via TRANSDERMAL
  Filled 2022-03-05 (×2): qty 1

## 2022-03-05 MED ORDER — SODIUM CHLORIDE 0.9 % IV SOLN
2.0000 g | Freq: Every day | INTRAVENOUS | Status: DC
  Administered 2022-03-05 – 2022-03-06 (×2): 2 g via INTRAVENOUS
  Filled 2022-03-05 (×2): qty 20

## 2022-03-05 MED ORDER — PANTOPRAZOLE SODIUM 40 MG PO TBEC
40.0000 mg | DELAYED_RELEASE_TABLET | Freq: Every day | ORAL | Status: DC
  Administered 2022-03-05 – 2022-03-06 (×2): 40 mg via ORAL
  Filled 2022-03-05 (×2): qty 1

## 2022-03-05 MED ORDER — ACETAMINOPHEN 500 MG PO TABS
1000.0000 mg | ORAL_TABLET | Freq: Two times a day (BID) | ORAL | Status: DC
  Administered 2022-03-05 – 2022-03-06 (×3): 1000 mg via ORAL
  Filled 2022-03-05 (×3): qty 2

## 2022-03-05 MED ORDER — ENOXAPARIN SODIUM 40 MG/0.4ML IJ SOSY
40.0000 mg | PREFILLED_SYRINGE | INTRAMUSCULAR | Status: DC
  Administered 2022-03-05 – 2022-03-06 (×2): 40 mg via SUBCUTANEOUS
  Filled 2022-03-05 (×2): qty 0.4

## 2022-03-05 MED ORDER — VALACYCLOVIR HCL 500 MG PO TABS: 1000.0000 mg | ORAL_TABLET | Freq: Two times a day (BID) | ORAL | Status: DC

## 2022-03-05 MED ORDER — FLUTICASONE FUROATE-VILANTEROL 100-25 MCG/ACT IN AEPB
1.0000 | INHALATION_SPRAY | Freq: Every day | RESPIRATORY_TRACT | Status: DC
  Administered 2022-03-06: 1 via RESPIRATORY_TRACT
  Filled 2022-03-05: qty 28

## 2022-03-05 MED ORDER — DOCUSATE SODIUM 100 MG PO CAPS: 100.0000 mg | ORAL_CAPSULE | Freq: Every day | ORAL | Status: DC | PRN

## 2022-03-05 MED ORDER — ONDANSETRON HCL 4 MG PO TABS: 4.0000 mg | ORAL_TABLET | Freq: Four times a day (QID) | ORAL | Status: DC | PRN

## 2022-03-05 MED ORDER — EMPAGLIFLOZIN 25 MG PO TABS: 25.0000 mg | ORAL_TABLET | Freq: Every day | ORAL | Status: DC

## 2022-03-05 MED ORDER — MAGNESIUM OXIDE -MG SUPPLEMENT 400 (240 MG) MG PO TABS
400.0000 mg | ORAL_TABLET | Freq: Every day | ORAL | Status: DC
  Administered 2022-03-05 – 2022-03-06 (×2): 400 mg via ORAL
  Filled 2022-03-05 (×2): qty 1

## 2022-03-05 MED ORDER — UMECLIDINIUM BROMIDE 62.5 MCG/ACT IN AEPB
1.0000 | INHALATION_SPRAY | Freq: Every day | RESPIRATORY_TRACT | Status: DC
  Administered 2022-03-06: 1 via RESPIRATORY_TRACT
  Filled 2022-03-05: qty 7

## 2022-03-05 MED ORDER — ALBUTEROL SULFATE (2.5 MG/3ML) 0.083% IN NEBU: 2.5000 mg | INHALATION_SOLUTION | Freq: Four times a day (QID) | RESPIRATORY_TRACT | Status: DC | PRN

## 2022-03-05 MED ORDER — ACETAMINOPHEN 325 MG PO TABS
650.0000 mg | ORAL_TABLET | Freq: Four times a day (QID) | ORAL | Status: DC | PRN
  Administered 2022-03-05: 650 mg via ORAL
  Filled 2022-03-05: qty 2

## 2022-03-05 NOTE — ED Notes (Signed)
This RN to bedside. Pt with eyes closed, appears to be sleeping. Husband at bedside endorses she has been asleep for "the last few hours" and he is letting her rest. Previous RN provided husband with breakfast menu and he ordered food. Pt respirations are even and unlabored, skin AfE/WD. VSS. No needs expressed at this time, call light is in reach. WCTM.

## 2022-03-05 NOTE — Assessment & Plan Note (Signed)
Suboxone was held due to acute metabolic encephalopathy Resume Suboxone at discharge  --Follow up as scheduled with prescriber.

## 2022-03-05 NOTE — Assessment & Plan Note (Addendum)
We held antihypertensives due to hypotension Resume medications at d/c as BP's now mildly elevated.

## 2022-03-05 NOTE — ED Notes (Signed)
In and out catheter used on patient.  Justus Memory, RN and this nurse were present in the room, with nurse [facility] performing the procedure.  Sterile field was maintained for procedure, and urine specimen was obtained.

## 2022-03-05 NOTE — Progress Notes (Signed)
   03/05/22 1600  Spiritual Encounters  Type of Visit Initial  Care provided to: Pt and family  Conversation partners present during encounter Nurse  Referral source Nurse (RN/NT/LPN)  Reason for visit Advance directives  OnCall Visit Yes   Chaplain responded to nurse consult. Chaplain provided education for advance directives. Patient will have Chaplain paged if she wants to complete.

## 2022-03-05 NOTE — Progress Notes (Signed)
Brief rounding note, same day as admission  HPI: Beth Stanley is a 72 y.o. female with medical history significant for hypertension, smoker, chronic Suboxone use, history of diffuse large cell B lymphoma status post stem cell transplant , followed by [facility] oncology, hospitalized from 2/8 to 2/9 with acute metabolic encephalopathy believed related to Suboxone who presents to the ED with excessive somnolence / altered mental status.  See full H&P for remainder of HPI on admission  Patient admitted after midnight for further evaluation of recurrent acute encephalopathy of unclear cause.  Interval history: Pt seen in ED holding for a bed with husband at bedside.  She is still very sleepy, but able to wake her up and carry on conversation.  Pt's husband notes this is improvement.  She could not be aroused or woken up previously.  He expressed concern about this same presentation just a couple of weeks ago.  Exam: General exam: sleeping but arousable, no acute distress HEENT: atraumatic, clear conjunctiva, anicteric sclera, moist mucus membranes, hearing grossly normal  Respiratory system: CTAB, no wheezes, rales or rhonchi, normal respiratory effort. Cardiovascular system: normal S1/S2, RRR, no JVD, murmurs, rubs, gallops,  no pedal edema.   Gastrointestinal system: soft, NT, ND, no HSM felt, +bowel sounds. Central nervous system: A&O x3. no gross focal neurologic deficits, normal speech Extremities: moves all, no edema, normal tone Skin: dry, intact, normal temperature, normal color, No rashes, lesions or ulcers Psychiatry: normal mood, congruent affect, judgement and insight appear normal   A&P: as per H&P by Dr. Damita Dunnings, with any changes or additions as below:  --Home Meds Reconciled: -- Continue Valtrex, Zoloft, PPI, Mg-ox, K-Cl 20 meq, Breo + Incruse for Trelegy, Colace PRN, scheduled Tylenol -- HOLD ramipril due to lower BP's, Robaxin until mentation improves, Jardiance given possible  recurrent UTI -- Agree with holding Suboxone for now -- Added on urine culture as none pending yet -- Note LR running at 150 cc/hr with soft BP's; stop fluids this afternoon and monitor BP closely -- Maintain MAP>65 -- Check ammonia level, TSH, vitamin D -- VBG with AM labs tomorrow     No charge

## 2022-03-05 NOTE — TOC Initial Note (Signed)
Transition of Care [facility]) - Initial/Assessment Note    Patient Details  Name: Beth Stanley MRN: [id number] Date of Birth: 01/13/1951  Transition of Care [facility] CM/SW Contact:    Laurena Slimmer, RN Phone Number: 03/05/2022, 11:42 PM  Clinical Narrative:                  Transition of Care [facility]) Screening Note   Patient Details  Name: Beth Stanley Date of Birth: Aug 18, 1950   Transition of Care [facility]) CM/SW Contact:    Laurena Slimmer, RN Phone Number: 03/05/2022, 11:42 PM    Transition of Care Department [facility]) has reviewed patient and no TOC needs have been identified at this time. We will continue to monitor patient advancement through interdisciplinary progression rounds. If new patient transition needs arise, please place a TOC consult.          Patient Goals and CMS Choice            Expected Discharge Plan and Services                                              Prior Living Arrangements/Services                       Activities of Daily Living Home Assistive Devices/Equipment: Eyeglasses ADL Screening (condition at time of admission) Patient's cognitive ability adequate to safely complete daily activities?: Yes Is the patient deaf or have difficulty hearing?: No Does the patient have difficulty seeing, even when wearing glasses/contacts?: No Does the patient have difficulty concentrating, remembering, or making decisions?: No Patient able to express need for assistance with ADLs?: Yes Does the patient have difficulty dressing or bathing?: No Independently performs ADLs?: No Communication: Independent Dressing (OT): Independent Grooming: Independent Feeding: Independent Bathing: Needs assistance Is this a change from baseline?: Change from baseline, expected to last <3 days Toileting: Needs assistance Is this a change from baseline?: Change from baseline, expected to last <3 days In/Out Bed: Needs assistance Is this a  change from baseline?: Change from baseline, expected to last <3 days Walks in Home: Needs assistance Is this a change from baseline?: Change from baseline, expected to last <3 days Does the patient have difficulty walking or climbing stairs?: Yes Weakness of Legs: Both Weakness of Arms/Hands: None  Permission Sought/Granted                  Emotional Assessment              Admission diagnosis:  Altered mental status, unspecified altered mental status type [id number] Acute metabolic encephalopathy [id number] Patient Active Problem List   Diagnosis Date Noted   SIRS (systemic inflammatory response syndrome) ([place]) 03/05/2022   Abnormal urinalysis [id number]   Acute metabolic encephalopathy [id number]   Chronic prescription opiate use - subxone Rx'd by Simona Huh, NP at [facility] 02/20/2022   Gait abnormality 0000000   Follicular lymphoma ([place]) 07/22/2021   Centrilobular emphysema ([place]) 04/20/2021   Neoplasm of uncertain behavior of parotid gland 10/18/2020   COVID-19 virus infection 09/29/2019   Impaired mobility and ADLs 08/10/2019   Diffuse large B-cell lymphoma of lymph nodes of head ([place]) 08/09/2019   Mass of left parotid gland 11/01/2018   Low back pain 01/25/2018   Unspecified lesions of oral mucosa 01/25/2018   Vaginal atrophy 07/07/2017  History of autologous stem cell transplant ([place]) 10/27/2016   Bilateral carotid artery stenosis 07/24/2016   Smoker 04/25/2014   Chronic musculoskeletal pain 03/09/2012   Lymphoma ([place]) 02/03/2012   Hyperlipidemia 02/03/2012   Hypertension 02/03/2012   GERD (gastroesophageal reflux disease) 02/03/2012   Depression 02/03/2012   PCP:  Rhea Bleacher, NP Pharmacy:   [facility] [address] [place] [address] Phone: [phone] Fax: [place], [place] [address] [address] Phone:  [phone] Fax: [place], [place] [address] Phone: [phone] Fax: [phone]  [place], [place] [address] Phone: [phone] Fax: [place] [id number] [place], [place] - [place] [address] Phone: [phone] Fax: [phone]     Social Determinants of Health (SDOH) Social History: [place]: No Food Insecurity (03/05/2022)  Housing: Low Risk  (03/05/2022)  Transportation Needs: No Transportation Needs (03/05/2022)  Utilities: Not At Risk (03/05/2022)  Alcohol Screen: Low Risk  (04/14/2018)  Depression (PHQ2-9): Low Risk  (04/14/2018)  Tobacco Use: High Risk (03/05/2022)   SDOH Interventions:     Readmission Risk Interventions     No data to display

## 2022-03-05 NOTE — H&P (Signed)
History and Physical    Patient: Beth Stanley [id number] DOB: 12-12-1950 DOA: 03/04/2022 DOS: the patient was seen and examined on 03/05/2022 PCP: Rhea Bleacher, NP  Patient coming from: Home  Chief Complaint:  Chief Complaint  Patient presents with   Altered Mental Status    HPI: Beth Stanley is a 72 y.o. female with medical history significant for hypertension, smoker, chronic Suboxone use, history of diffuse large cell B lymphoma status post stem cell transplant , followed by [facility] oncology, hospitalized from 2/8 to 2/9 with acute metabolic encephalopathy believed related to Suboxone who presents to the ED with excessive somnolence similar to her prior presentation.  History is provided by husband at the bedside.  Patient was in her usual state of health and went to work earlier in the day but on her arrival home from work she was very sluggish and lethargic and struggled to go up the stairs of her house.  She progressively became more lethargic this prompting the visit to the ED.  At the time of my assessment, patient is drowsy but is able to answer questions.  She states that she did not take more than her usual Suboxone dose and in fact states that she takes a third of the dose daily and denies any other substance use.  She reports the discomfort on the left side of her abdomen and has a flareup in her low back pain.  She has mild sinus congestion as well as urinary frequency but she otherwise feels close to baseline.  She reports being treated with antibiotics for UTI 2 weeks prior. ED course and data review: BP nadir 97/48, temp 99.6 with pulse 92.  Labs with WBC 10,700 and urinalysis with moderate leukocyte esterases.  Labs otherwise unremarkable.  UDS pending. EKG, post reviewed and interpreted showing sinus rhythm at 90.  Lumbar spine CT with no acute fracture or traumatic subluxation shows multilevel degenerative disc disease with moderate bilateral multilevel recess.   Chest x-ray nonacute, head CT nonacute. Patient treated with an IV fluid bolus.  Hospitalist consulted for admission.   Review of Systems: As mentioned in the history of present illness. All other systems reviewed and are negative.  Past Medical History:  Diagnosis Date   Anxiety    Arthritis    C. difficile colitis 10/01/2019   COVID-19 virus infection 09/29/2019   Diabetes ([place])    Heartburn    History of stem cell transplant ([place])    HTN (hypertension)    Multifocal pneumonia 04/19/2021   Non Hodgkin's lymphoma ([place]) 2004   Hx chemo tx's.    Past Surgical History:  Procedure Laterality Date   TOTAL ABDOMINAL HYSTERECTOMY     Social History:  reports that she has been smoking cigarettes. She has been smoking an average of 1 pack per day. She has never used smokeless tobacco. She reports that she does not currently use alcohol. She reports that she does not use drugs.  Allergies  Allergen Reactions   Aspirin     Stomach cramp  Other Reaction(s): abdominal pain, Not available   Latex Swelling    Other Reaction(s): Angioedema, Not available   Other    Tape Rash    Makes Whelps on Skin Makes Whelps on Skin   Wound Dressing Adhesive Rash    Makes Whelps on Skin, Prefers paper tape    Family History  Problem Relation Age of Onset   Diabetes Mother    Lung cancer Sister  Lung cancer Brother    Kidney cancer Brother        removed kidney    Lung cancer Brother    Prostate cancer Neg Hx    Bladder Cancer Neg Hx     Prior to Admission medications   Medication Sig Start Date End Date Taking? Authorizing Provider  acetaminophen (TYLENOL) 500 MG tablet Take 1,000 mg by mouth every 6 (six) hours as needed.    [provider]  albuterol (VENTOLIN HFA) 108 (90 Base) MCG/ACT inhaler Inhale 1 puff into the lungs every 6 (six) hours as needed for wheezing. 02/21/22   Sharen Hones, MD  Buprenorphine HCl-Naloxone HCl 8-2 MG FILM Place 1 strip under the tongue 3 (three)  times daily. 11/04/20   [provider]  Cholecalciferol 1.25 MG (50000 UT) capsule Take 1 tablet by mouth once a week.    [provider]  cyanocobalamin 1000 MCG tablet Take 1 tablet by mouth daily. 04/17/20   [provider]  estradiol (ESTRACE) 0.1 MG/GM vaginal cream Place 1 Applicatorful vaginally daily.    [provider]  ferrous sulfate 325 (65 FE) MG tablet Take 325 mg by mouth 3 (three) times a week. Monday. Weds, fri 02/11/21   [provider]  fluticasone-salmeterol (ADVAIR) 250-50 MCG/ACT AEPB Inhale 1 puff into the lungs 2 (two) times daily as needed.    [provider]  gemfibrozil (LOPID) 600 MG tablet Take 600 mg by mouth 2 (two) times daily before a meal.    [provider]  JARDIANCE 25 MG TABS tablet Take 25 mg by mouth daily. 08/17/20   [provider]  magnesium oxide (MAG-OX) 400 (240 Mg) MG tablet Take 1 tablet by mouth daily. 10/01/20   [provider]  methocarbamol (ROBAXIN) 500 MG tablet Take 500 mg by mouth 2 (two) times daily.    [provider]  omeprazole (PRILOSEC) 20 MG capsule Take 20 mg by mouth daily. 08/25/20   [provider]  phenazopyridine (PYRIDIUM) 200 MG tablet Take 200 mg by mouth 3 (three) times daily as needed for pain.    [provider]  potassium chloride SA (KLOR-CON) 20 MEQ tablet Take 20 mEq by mouth daily. 07/28/19   [provider]  ramipril (ALTACE) 2.5 MG capsule Take 1 capsule (2.5 mg total) by mouth daily. 08/08/17   [name], PA-C  sertraline (ZOLOFT) 100 MG tablet TAKE ONE (1) TABLET EACH DAY Patient taking differently: Take 150 mg by mouth daily. 06/10/17   [name], PA-C  TRELEGY ELLIPTA 100-62.5-25 MCG/ACT AEPB Inhale 1 puff into the lungs daily. 04/04/21   [provider]  valACYclovir (VALTREX) 1000 MG tablet Take 1,000 mg by mouth 2 (two) times daily. 08/22/20   [provider]    Physical  Exam: Vitals:   03/05/22 0100 03/05/22 0105 03/05/22 0130 03/05/22 0230  BP: (!) 97/48  (!) 115/56 (!) 113/50  Pulse: 80  75 79  Resp: 15  14 13  $ Temp:  98.1 F (36.7 C)    TempSrc:  Oral    SpO2: 92%  93% 90%  Weight:      Height:       Physical Exam Vitals and nursing note reviewed.  Constitutional:      General: She is not in acute distress. HENT:     Head: Normocephalic and atraumatic.  Cardiovascular:     Rate and Rhythm: Normal rate and regular rhythm.     Heart sounds:  Normal heart sounds.  Pulmonary:     Effort: Pulmonary effort is normal.     Breath sounds: Normal breath sounds.  Abdominal:     Palpations: Abdomen is soft.     Tenderness: There is no abdominal tenderness.  Neurological:     General: No focal deficit present.     Mental Status: She is lethargic.     Labs on Admission: I have personally reviewed following labs and imaging studies  CBC: Recent Labs  Lab 03/04/22 2205  WBC 10.7*  NEUTROABS 7.9*  HGB 12.2  HCT 37.4  MCV 93.0  PLT 0000000   Basic Metabolic Panel: Recent Labs  Lab 03/04/22 2205  NA 135  K 4.3  CL 102  CO2 24  GLUCOSE 113*  BUN 13  CREATININE 0.92  CALCIUM 9.3   GFR: Estimated Creatinine Clearance: 44.4 mL/min (by C-G formula based on SCr of 0.92 mg/dL). Liver Function Tests: Recent Labs  Lab 03/04/22 2205  AST 20  ALT 16  ALKPHOS 90  BILITOT 0.5  PROT 8.0  ALBUMIN 4.2   No results for input(s): "LIPASE", "AMYLASE" in the last 168 hours. No results for input(s): "AMMONIA" in the last 168 hours. Coagulation Profile: Recent Labs  Lab 03/04/22 2205  INR 1.0   Cardiac Enzymes: No results for input(s): "CKTOTAL", "CKMB", "CKMBINDEX", "TROPONINI" in the last 168 hours. BNP (last 3 results) No results for input(s): "PROBNP" in the last 8760 hours. HbA1C: No results for input(s): "HGBA1C" in the last 72 hours. CBG: No results for input(s): "GLUCAP" in the last 168 hours. Lipid Profile: No results for  input(s): "CHOL", "HDL", "LDLCALC", "TRIG", "CHOLHDL", "LDLDIRECT" in the last 72 hours. Thyroid Function Tests: No results for input(s): "TSH", "T4TOTAL", "FREET4", "T3FREE", "THYROIDAB" in the last 72 hours. Anemia Panel: No results for input(s): "VITAMINB12", "FOLATE", "FERRITIN", "TIBC", "IRON", "RETICCTPCT" in the last 72 hours. Urine analysis:    Component Value Date/Time   COLORURINE YELLOW (A) 03/05/2022 0325   APPEARANCEUR CLOUDY (A) 03/05/2022 0325   APPEARANCEUR Clear 06/05/2016 1003   LABSPEC 1.005 03/05/2022 0325   PHURINE 5.0 03/05/2022 0325   GLUCOSEU >=500 (A) 03/05/2022 0325   HGBUR MODERATE (A) 03/05/2022 0325   BILIRUBINUR NEGATIVE 03/05/2022 0325   BILIRUBINUR negative 02/16/2017 1628   BILIRUBINUR Negative 06/05/2016 1003   KETONESUR NEGATIVE 03/05/2022 0325   PROTEINUR 30 (A) 03/05/2022 0325   UROBILINOGEN 0.2 02/16/2017 1628   UROBILINOGEN 0.2 01/24/2010 1052   NITRITE NEGATIVE 03/05/2022 0325   LEUKOCYTESUR MODERATE (A) 03/05/2022 0325    Radiological Exams on Admission: CT Lumbar Spine Wo Contrast  Result Date: 03/04/2022 CLINICAL DATA:  Low back pain, increased risk of fracture history of recent urinary tract infection EXAM: CT LUMBAR SPINE WITHOUT CONTRAST TECHNIQUE: Multidetector CT imaging of the lumbar spine was performed without intravenous contrast administration. Multiplanar CT image reconstructions were also generated. RADIATION DOSE REDUCTION: This exam was performed according to the departmental dose-optimization program which includes automated exposure control, adjustment of the mA and/or kV according to patient size and/or use of iterative reconstruction technique. COMPARISON:  None Available. FINDINGS: Segmentation: 5 lumbar type vertebrae. Alignment: Mild retrolisthesis of L2 and L3. Mild anterolisthesis of L4. Vertebrae: No acute fracture or focal pathologic process. Paraspinal and other soft tissues: Aortic atherosclerotic calcifications  retroaortic left renal vein paraspinal soft tissues are unremarkable Disc levels: Multilevel degenerate disc disease with bulky marginal osteophytes. T12-L1: No significant findings L1-L2: Disc height loss and mild L2-L3: Disc height loss and broad-based disc  bulge with moderate bilateral lateral recess stenosis. Mild bilateral facet joint arthropathy. No significant neural foraminal stenosis L3-L4: Disc height loss and disc bulge with moderate bilateral lateral recess stenosis. Mild bilateral facet joint arthropathy. No significant neural foraminal stenosis. L4-L5: Broad-based disc bulge and moderate bilateral facet joint arthropathy. Moderate lateral recess stenosis bilaterally. No significant neural foraminal stenosis. L5-S1: Disc height loss and broad-based disc bulge with moderate bilateral facet joint arthropathy. Moderate bilateral lateral recess stenosis. Mild left neural foraminal stenosis. IMPRESSION: 1. No acute fracture or traumatic subluxation. 2. Multilevel degenerate disc disease with moderate bilateral lateral recess stenosis at L2-L3, L3-L4, L4-L5, and L5-S1. 3. Aortic atherosclerosis. Aortic Atherosclerosis (ICD10-I70.0). Electronically Signed   By: Keane Police D.O.   On: 03/04/2022 23:56   DG Chest Portable 1 View  Result Date: 03/04/2022 CLINICAL DATA:  Lethargy. EXAM: PORTABLE CHEST 1 VIEW COMPARISON:  February 20, 2022 FINDINGS: There is stable right-sided venous Port-A-Cath positioning. The heart size and mediastinal contours are within normal limits. Marked severity calcification of the aortic arch is seen. Both lungs are clear. Multilevel degenerative changes are present throughout the thoracic spine. IMPRESSION: No active cardiopulmonary disease. Electronically Signed   By: Virgina Norfolk M.D.   On: 03/04/2022 23:09   CT HEAD WO CONTRAST (5MM)  Result Date: 03/04/2022 CLINICAL DATA:  Altered mental status EXAM: CT HEAD WITHOUT CONTRAST TECHNIQUE: Contiguous axial images were  obtained from the base of the skull through the vertex without intravenous contrast. RADIATION DOSE REDUCTION: This exam was performed according to the departmental dose-optimization program which includes automated exposure control, adjustment of the mA and/or kV according to patient size and/or use of iterative reconstruction technique. COMPARISON:  02/20/2022 FINDINGS: Brain: There is no mass, hemorrhage or extra-axial collection. There is generalized atrophy without lobar predilection. Hypodensity of the white matter is most commonly associated with chronic microvascular disease. Vascular: No abnormal hyperdensity of the major intracranial arteries or dural venous sinuses. No intracranial atherosclerosis. Skull: The visualized skull base, calvarium and extracranial soft tissues are normal. Sinuses/Orbits: Right maxillary and ethmoid sinus mucosal disease. The orbits are normal. IMPRESSION: 1. No acute intracranial abnormality. 2. Generalized atrophy and findings of chronic microvascular disease. Electronically Signed   By: Ulyses Jarred M.D.   On: 03/04/2022 22:59     Data Reviewed: Relevant notes from primary care and specialist visits, past discharge summaries as available in EHR, including Care Everywhere. Prior diagnostic testing as pertinent to current admission diagnoses Updated medications and problem lists for reconciliation ED course, including vitals, labs, imaging, treatment and response to treatment Triage notes, nursing and pharmacy notes and ED provider's notes Notable results as noted in HPI   Assessment and Plan: Acute metabolic encephalopathy Patient presents with drowsiness and lethargy Suspect related to Suboxone versus acute infection/UTI Follow UDS, follow urine culture Hold all sedating meds Neurologic checks with fall and aspiration precautions  SIRS (systemic inflammatory response syndrome) (HCC) Abnormal urinalysis, possible UTI Possible sepsis Patient has a  low-grade temperature with heart rate of 92 BP [place]ear of 97/48 with leukocytosis and abnormal UA Will start Rocephin IV hydration per sepsis Follow blood and urine cultures  Centrilobular emphysema (HCC) Stable.  Continue Trelegy.  Albuterol as needed  Chronic prescription opiate use - subxone Rx'd by Simona Huh, NP at [place] due to acute metabolic encephalopathy Follow UDS  GERD (gastroesophageal reflux disease) Continue PPI  Depression Continue sertraline  Follicular lymphoma ([place]) History of stem cell transplant Followed by oncology at  [place]  Hypertension Hold antihypertensives due to hypotension     DVT prophylaxis: Lovenox  Consults: none  Advance Care Planning:   Code Status: Prior   Family Communication: Husband at bedside  Disposition Plan: Back to previous home environment  Severity of Illness: The appropriate patient status for this patient is OBSERVATION. Observation status is judged to be reasonable and necessary in order to provide the required intensity of service to ensure the patient's safety. The patient's presenting symptoms, physical exam findings, and initial radiographic and laboratory data in the context of their medical condition is felt to place them at decreased risk for further clinical deterioration. Furthermore, it is anticipated that the patient will be medically stable for discharge from the hospital within 2 midnights of admission.   Author: Athena Masse, MD 03/05/2022 4:01 AM  For on call review [email or web address].

## 2022-03-05 NOTE — Assessment & Plan Note (Signed)
Continue PPI ?

## 2022-03-05 NOTE — Assessment & Plan Note (Signed)
History of stem cell transplant Followed by oncology at [facility]

## 2022-03-05 NOTE — Assessment & Plan Note (Signed)
Continue sertraline 

## 2022-03-05 NOTE — Assessment & Plan Note (Signed)
Stable.  Continue Trelegy.  Albuterol as needed

## 2022-03-05 NOTE — Assessment & Plan Note (Addendum)
Due to UTI. Patient presented with drowsiness and lethargy, very difficult to awaken.   There was suspicion this due to Suboxone, however patient takes only 1 dose daily (3 times daily is prescribed). She improved quickly with antibiotics for UTI. Mental status at baseline. Resume Suboxone at discharge.

## 2022-03-05 NOTE — Assessment & Plan Note (Addendum)
Met criteria with low-grade temperature, mild tachycardia,  BP nadir of 97/48 with leukocytosis and abnormal UA consistent with UTI. Treated with empiric Rocephin Discharge on Omnicef to complete 5 days Follow up pending urine culture Due to recurrent UTI's, recommend following up with urogynecologist

## 2022-03-06 ENCOUNTER — Encounter: Payer: Self-pay | Admitting: Internal Medicine

## 2022-03-06 DIAGNOSIS — N390 Urinary tract infection, site not specified: Secondary | ICD-10-CM | POA: Diagnosis not present

## 2022-03-06 DIAGNOSIS — A419 Sepsis, unspecified organism: Secondary | ICD-10-CM

## 2022-03-06 LAB — GLUCOSE, CAPILLARY
Glucose-Capillary: 110 mg/dL — ABNORMAL HIGH (ref 70–99)
Glucose-Capillary: 135 mg/dL — ABNORMAL HIGH (ref 70–99)

## 2022-03-06 LAB — URINE CULTURE: Culture: NO GROWTH

## 2022-03-06 MED ORDER — CEFDINIR 300 MG PO CAPS: 300.0000 mg | ORAL_CAPSULE | Freq: Two times a day (BID) | ORAL | 0 refills | Status: AC

## 2022-03-06 NOTE — Evaluation (Signed)
Physical Therapy Evaluation Patient Details Name: Beth Stanley MRN: [id number] DOB: 1950/06/09 Today's Date: 03/06/2022  History of Present Illness  Pt is a 72 y/o F admitted on 03/04/22 after presenting with c/o feeling sluggish & lethargic. Pt is being treated for acute metabolic encephalopathy. Of note, pt was recently hospitalized from 2/8-2/9 with acute metabolic encephalopathy. PMH: HTN, smoker, chronic suboxone use, diffuse large cell B lymphoma s/p stem cell transplant, anxiety  Clinical Impression  Pt seen for PT evaluation with pt agreeable. Pt pleasant throughout & follows commands but does present with decreased safety awareness. Pt is able to complete bed mobility & transfers independently, gait around unit without AD with CGA<>supervision, & negotiate 7 steps with B rails & CGA. Pt presents with anterior lean during mobility, increased gait speed, & requires cuing to correct to prevent LOB forward. Educated pt on benefits of using SPC or RW to promote upright posture with pt stating she uses one at home PRN. Recommend HHPT f/u upon d/c.    Recommendations for follow up therapy are one component of a multi-disciplinary discharge planning process, led by the attending physician.  Recommendations may be updated based on patient status, additional functional criteria and insurance authorization.  Follow Up Recommendations Home health PT      Assistance Recommended at Discharge Intermittent Supervision/Assistance  Patient can return home with the following  Assistance with cooking/housework;Assist for transportation;Help with stairs or ramp for entrance    Equipment Recommendations None recommended by PT  Recommendations for Other Services       Functional Status Assessment Patient has had a recent decline in their functional status and demonstrates the ability to make significant improvements in function in a reasonable and predictable amount of time.     Precautions /  Restrictions Precautions Precautions: Fall Restrictions Weight Bearing Restrictions: No      Mobility  Bed Mobility Overal bed mobility: Modified Independent             General bed mobility comments: supine>sit    Transfers Overall transfer level: Independent                 General transfer comment: STS from EOB    Ambulation/Gait Ambulation/Gait assistance: Min guard, Supervision Gait Distance (Feet): 180 Feet Assistive device: None Gait Pattern/deviations: Decreased step length - right, Decreased step length - left, Decreased dorsiflexion - right, Decreased dorsiflexion - left, Decreased stride length Gait velocity: increased     General Gait Details: Pt with anterior lean throughout gait, upper body moving ahead of lower body, pt requires cuing throughout to slow down/stop to regain balance & upright standing vs anterior lean  Stairs Stairs: Yes Stairs assistance: Min guard Stair Management: Two rails Number of Stairs: 7 General stair comments: Alternating pattern to ascend.  Wheelchair Mobility    Modified Rankin (Stroke Patients Only)       Balance Overall balance assessment: Needs assistance Sitting-balance support: Feet supported Sitting balance-Leahy Scale: Normal     Standing balance support: During functional activity, No upper extremity supported Standing balance-Leahy Scale: Fair                               Pertinent Vitals/Pain Pain Assessment Pain Assessment: No/denies pain    Home Living Family/patient expects to be discharged to:: Private residence Living Arrangements: Spouse/significant other Available Help at Discharge: Family Type of Home: House Home Access: Stairs to enter Entrance Stairs-Rails: Right;Left;Can reach both  Entrance Stairs-Number of Steps: 6   Home Layout: Two level;Able to live on main level with bedroom/bathroom Home Equipment: Rolling Walker (2 wheels);Cane - single point       Prior Function Prior Level of Function : Independent/Modified Independent;Working/employed;Driving             Mobility Comments: 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       Hand Dominance        Extremity/Trunk Assessment   Upper Extremity Assessment Upper Extremity Assessment: Overall WFL for tasks assessed    Lower Extremity Assessment Lower Extremity Assessment: Overall WFL for tasks assessed    Cervical / Trunk Assessment Cervical / Trunk Assessment:  (rounded shoulders, forward head)  Communication   Communication: No difficulties  Cognition Arousal/Alertness: Awake/alert Behavior During Therapy: WFL for tasks assessed/performed Overall Cognitive Status: Within Functional Limits for tasks assessed                                 General Comments: Pt pleasant, follows simple commands during session, oriented but presents with decreased safety awareness as pt requires ongoing cuing to slow down during gait to regain balance 2/2 ongoing anterior lean.        General Comments General comments (skin integrity, edema, etc.): Pt on room air, wheezing after stair negotiation, SpO2 >90% throughout session. Max HR 137 bpm.    Exercises     Assessment/Plan    PT Assessment Patient needs continued PT services  PT Problem List Cardiopulmonary status limiting activity;Decreased balance;Decreased mobility;Decreased safety awareness;Decreased knowledge of use of DME;Decreased activity tolerance;Decreased cognition;Decreased strength       PT Treatment Interventions Therapeutic exercise;DME instruction;Balance training;Gait training;Neuromuscular re-education;Stair training;Functional mobility training;Cognitive remediation;Therapeutic activities;Patient/family education    PT Goals (Current goals can be found in the Care Plan section)  Acute Rehab PT Goals Patient Stated Goal: go  home PT Goal Formulation: With patient Time For Goal Achievement: 03/14/22 Potential to Achieve Goals: Good    Frequency Min 2X/week     Co-evaluation               AM-PAC PT "6 Clicks" Mobility  Outcome Measure Help needed turning from your back to your side while in a flat bed without using bedrails?: None Help needed moving from lying on your back to sitting on the side of a flat bed without using bedrails?: None Help needed moving to and from a bed to a chair (including a wheelchair)?: A Little Help needed standing up from a chair using your arms (e.g., wheelchair or bedside chair)?: None Help needed to walk in hospital room?: A Little Help needed climbing 3-5 steps with a railing? : A Little 6 Click Score: 21    End of Session   Activity Tolerance: Patient tolerated treatment well Patient left: in bed;with call bell/phone within reach;with family/visitor present   PT Visit Diagnosis: Unsteadiness on feet (R26.81);Other abnormalities of gait and mobility (R26.89)    Time: 1008-0300 PT Time Calculation (min) (ACUTE ONLY): 1012 min   Charges:   PT Evaluation $PT Eval Low Complexity: Rosamond, PT, DPT 03/06/22, 10:28 AM   Waunita Schooner 03/06/2022, 10:27 AM

## 2022-03-06 NOTE — Discharge Summary (Signed)
Physician Discharge Summary   Patient: Beth Stanley MRN: [id number] DOB: [birth year]  Admit date:     03/04/2022  Discharge date: 03/06/22  Discharge Physician: Ezekiel Slocumb   PCP: Rhea Bleacher, NP   Recommendations at discharge:   Follow up with Primary Care in 1-2 weeks Follow up with Uro-gynecology regarding recurrent UTI's Repeat CBC, BMP in 1-2 weeks    Discharge Diagnoses: Principal Problem:   Sepsis secondary to UTI [facility]) Active Problems:   Centrilobular emphysema ([place])   Abnormal urinalysis   Chronic prescription opiate use - subxone Rx'd by Simona Huh, NP at [facility]   GERD (gastroesophageal reflux disease)   Depression   Hypertension   History of autologous stem cell transplant ([place])   Follicular lymphoma ([place])  Resolved Problems:   Acute metabolic encephalopathy  Hospital Course: No notes on file  Assessment and Plan: * Sepsis secondary to UTI ([place]) Met criteria with low-grade temperature, mild tachycardia,  BP nadir of 97/48 with leukocytosis and abnormal UA consistent with UTI. Treated with empiric Rocephin Discharge on Omnicef to complete 5 days Follow up pending urine culture Due to recurrent UTI's, recommend following up with urogynecologist  Acute metabolic encephalopathy-resolved as of 03/06/2022 Due to UTI. Patient presented with drowsiness and lethargy, very difficult to awaken.   There was suspicion this due to Suboxone, however patient takes only 1 dose daily (3 times daily is prescribed). She improved quickly with antibiotics for UTI. Mental status at baseline. Resume Suboxone at discharge.  Centrilobular emphysema ([facility]) Stable.  Continue Trelegy.  Albuterol as needed  Chronic prescription opiate use - subxone Rx'd by Simona Huh, NP at [place] was held due to acute metabolic encephalopathy Resume Suboxone at discharge  --Follow up as scheduled with prescriber.  GERD (gastroesophageal reflux  disease) Continue PPI  Depression Continue sertraline  Follicular lymphoma ([place]) History of stem cell transplant Followed by oncology at [facility]  History of autologous stem cell transplant ([place]) For Non-Hodgkins lymphoma  Hypertension We held antihypertensives due to hypotension Resume medications at d/c as BP's now mildly elevated.         Consultants: None Procedures performed: None  Disposition: Home  Diet recommendation:  Discharge Diet Orders (From admission, onward)     Start     Ordered   03/06/22 0000  Diet - low sodium heart healthy        03/06/22 1058           Cardiac diet DISCHARGE MEDICATION: Allergies as of 03/06/2022       Reactions   Aspirin    Stomach cramp Other Reaction(s): abdominal pain, Not available   Latex Swelling   Other Reaction(s): Angioedema, Not available   Other    Tape Rash   Makes Whelps on Skin Makes Whelps on Skin   Wound Dressing Adhesive Rash   Makes Whelps on Skin, Prefers paper tape        Medication List     STOP taking these medications    ciprofloxacin 500 MG tablet Commonly known as: CIPRO   cyanocobalamin 1000 MCG tablet   fluticasone-salmeterol 250-50 MCG/ACT Aepb Commonly known as: ADVAIR   gemfibrozil 600 MG tablet Commonly known as: LOPID   phenazopyridine 200 MG tablet Commonly known as: PYRIDIUM       TAKE these medications    Acetaminophen 8 Hour 650 MG CR tablet Generic drug: acetaminophen Take 1,300 mg by mouth 2 (two) times daily. What changed: Another  medication with the same name was removed. Continue taking this medication, and follow the directions you see here.   albuterol 108 (90 Base) MCG/ACT inhaler Commonly known as: VENTOLIN HFA Inhale 1 puff into the lungs every 6 (six) hours as needed for wheezing.   b complex vitamins capsule Take 1 capsule by mouth daily.   Buprenorphine HCl-Naloxone HCl 8-2 MG Film Place 1 strip under the tongue in the  morning, at noon, and at bedtime.   cefdinir 300 MG capsule Commonly known as: OMNICEF Take 1 capsule (300 mg total) by mouth 2 (two) times daily for 3 days. Start tomorrow AM (2/23) with breakfast   Cholecalciferol 1.25 MG (50000 UT) capsule Take 1 tablet by mouth once a week.   diphenoxylate-atropine 2.5-0.025 MG tablet Commonly known as: LOMOTIL Take 1 tablet by mouth 4 (four) times daily.   docusate sodium 100 MG capsule Commonly known as: COLACE Take 100 mg by mouth daily as needed for mild constipation.   estradiol 0.1 MG/GM vaginal cream Commonly known as: ESTRACE Place 1 Applicatorful vaginally daily.   ferrous sulfate 325 (65 FE) MG tablet Take 325 mg by mouth 3 (three) times a week. Monday. Weds, fri   Jardiance 25 MG Tabs tablet Generic drug: empagliflozin Take 25 mg by mouth daily.   magnesium oxide 400 (240 Mg) MG tablet Commonly known as: MAG-OX Take 1 tablet by mouth daily.   methocarbamol 500 MG tablet Commonly known as: ROBAXIN Take 500 mg by mouth 2 (two) times daily.   omeprazole 20 MG capsule Commonly known as: PRILOSEC Take 20 mg by mouth daily.   potassium chloride SA 20 MEQ tablet Commonly known as: KLOR-CON M Take 20 mEq by mouth daily.   ramipril 2.5 MG capsule Commonly known as: ALTACE Take 1 capsule (2.5 mg total) by mouth daily.   sertraline 100 MG tablet Commonly known as: ZOLOFT TAKE ONE (1) TABLET EACH DAY What changed:  how much to take how to take this when to take this additional instructions   Trelegy Ellipta 100-62.5-25 MCG/ACT Aepb Generic drug: Fluticasone-Umeclidin-Vilant Inhale 1 puff into the lungs daily.   valACYclovir 1000 MG tablet Commonly known as: VALTREX Take 1,000 mg by mouth daily.        Discharge Exam: Filed Weights   03/04/22 2213  Weight: 59 kg   General exam: awake, alert, no acute distress HEENT: atraumatic, clear conjunctiva, anicteric sclera, moist mucus membranes, hearing grossly  normal  Respiratory system: CTAB, no wheezes, rales or rhonchi, normal respiratory effort. Cardiovascular system: normal S1/S2, RRR, no JVD, murmurs, rubs, gallops, no pedal edema.   Gastrointestinal system: soft, NT, ND, no HSM felt, +bowel sounds. Central nervous system: A&O x 4. no gross focal neurologic deficits, normal speech Extremities: moves all, no edema, normal tone Skin: dry, intact, normal temperature, normal color, No rashes, lesions or ulcers Psychiatry: normal mood, congruent affect, judgement and insight appear normal   Condition at discharge: stable  The results of significant diagnostics from this hospitalization (including imaging, microbiology, ancillary and laboratory) are listed below for reference.   Imaging Studies: CT Lumbar Spine Wo Contrast  Result Date: 03/04/2022 CLINICAL DATA:  Low back pain, increased risk of fracture history of recent urinary tract infection EXAM: CT LUMBAR SPINE WITHOUT CONTRAST TECHNIQUE: Multidetector CT imaging of the lumbar spine was performed without intravenous contrast administration. Multiplanar CT image reconstructions were also generated. RADIATION DOSE REDUCTION: This exam was performed according to the departmental dose-optimization program which includes automated exposure control,  adjustment of the mA and/or kV according to patient size and/or use of iterative reconstruction technique. COMPARISON:  None Available. FINDINGS: Segmentation: 5 lumbar type vertebrae. Alignment: Mild retrolisthesis of L2 and L3. Mild anterolisthesis of L4. Vertebrae: No acute fracture or focal pathologic process. Paraspinal and other soft tissues: Aortic atherosclerotic calcifications retroaortic left renal vein paraspinal soft tissues are unremarkable Disc levels: Multilevel degenerate disc disease with bulky marginal osteophytes. T12-L1: No significant findings L1-L2: Disc height loss and mild L2-L3: Disc height loss and broad-based disc bulge with moderate  bilateral lateral recess stenosis. Mild bilateral facet joint arthropathy. No significant neural foraminal stenosis L3-L4: Disc height loss and disc bulge with moderate bilateral lateral recess stenosis. Mild bilateral facet joint arthropathy. No significant neural foraminal stenosis. L4-L5: Broad-based disc bulge and moderate bilateral facet joint arthropathy. Moderate lateral recess stenosis bilaterally. No significant neural foraminal stenosis. L5-S1: Disc height loss and broad-based disc bulge with moderate bilateral facet joint arthropathy. Moderate bilateral lateral recess stenosis. Mild left neural foraminal stenosis. IMPRESSION: 1. No acute fracture or traumatic subluxation. 2. Multilevel degenerate disc disease with moderate bilateral lateral recess stenosis at L2-L3, L3-L4, L4-L5, and L5-S1. 3. Aortic atherosclerosis. Aortic Atherosclerosis (ICD10-I70.0). Electronically Signed   By: Keane Police D.O.   On: 03/04/2022 23:56   DG Chest Portable 1 View  Result Date: 03/04/2022 CLINICAL DATA:  Lethargy. EXAM: PORTABLE CHEST 1 VIEW COMPARISON:  February 20, 2022 FINDINGS: There is stable right-sided venous Port-A-Cath positioning. The heart size and mediastinal contours are within normal limits. Marked severity calcification of the aortic arch is seen. Both lungs are clear. Multilevel degenerative changes are present throughout the thoracic spine. IMPRESSION: No active cardiopulmonary disease. Electronically Signed   By: Virgina Norfolk M.D.   On: 03/04/2022 23:09   CT HEAD WO CONTRAST (5MM)  Result Date: 03/04/2022 CLINICAL DATA:  Altered mental status EXAM: CT HEAD WITHOUT CONTRAST TECHNIQUE: Contiguous axial images were obtained from the base of the skull through the vertex without intravenous contrast. RADIATION DOSE REDUCTION: This exam was performed according to the departmental dose-optimization program which includes automated exposure control, adjustment of the mA and/or kV according to  patient size and/or use of iterative reconstruction technique. COMPARISON:  02/20/2022 FINDINGS: Brain: There is no mass, hemorrhage or extra-axial collection. There is generalized atrophy without lobar predilection. Hypodensity of the white matter is most commonly associated with chronic microvascular disease. Vascular: No abnormal hyperdensity of the major intracranial arteries or dural venous sinuses. No intracranial atherosclerosis. Skull: The visualized skull base, calvarium and extracranial soft tissues are normal. Sinuses/Orbits: Right maxillary and ethmoid sinus mucosal disease. The orbits are normal. IMPRESSION: 1. No acute intracranial abnormality. 2. Generalized atrophy and findings of chronic microvascular disease. Electronically Signed   By: Ulyses Jarred M.D.   On: 03/04/2022 22:59   CT Head Wo Contrast  Result Date: 02/20/2022 CLINICAL DATA:  Mental status change EXAM: CT HEAD WITHOUT CONTRAST TECHNIQUE: Contiguous axial images were obtained from the base of the skull through the vertex without intravenous contrast. RADIATION DOSE REDUCTION: This exam was performed according to the departmental dose-optimization program which includes automated exposure control, adjustment of the mA and/or kV according to patient size and/or use of iterative reconstruction technique. COMPARISON:  MRI brain 11/16/2020, CT brain 11/15/2020 FINDINGS: Brain: No acute territorial infarction, hemorrhage or intracranial mass. Mild atrophy. Extensive confluent white matter hypodensity without significant change. Chronic appearing lacunar infarct left basal ganglia. Stable ventricle size. Vascular: No hyperdense vessels.  Carotid vascular calcification Skull:  Normal. Negative for fracture or focal lesion. Sinuses/Orbits: Moderate mucosal thickening in the right ethmoid maxillary and frontal sinuses. Other: None. IMPRESSION: 1. No CT evidence for acute intracranial abnormality. 2. Atrophy and chronic small vessel ischemic  changes of the white matter. Electronically Signed   By: Donavan Foil M.D.   On: 02/20/2022 22:26   DG Chest Portable 1 View  Result Date: 02/20/2022 CLINICAL DATA:  Shortness of breath EXAM: PORTABLE CHEST 1 VIEW COMPARISON:  04/19/2021 FINDINGS: Right-sided central venous port tip at the right atrium. No acute airspace disease. Atelectasis or scar at the left base. Stable cardiomediastinal silhouette. IMPRESSION: No active disease. Atelectasis or scar at the left base. Electronically Signed   By: Donavan Foil M.D.   On: 02/20/2022 21:51    Microbiology: Results for orders placed or performed during the hospital encounter of 03/04/22  Culture, blood (Routine x 2)     Status: None (Preliminary result)   Collection Time: 03/04/22 10:05 PM   Specimen: BLOOD  Result Value Ref Range Status   Specimen Description BLOOD RIGHT ASSIST CONTROL  Final   Special Requests   Final    BOTTLES DRAWN AEROBIC AND ANAEROBIC Blood Culture adequate volume   Culture   Final    NO GROWTH 2 DAYS Performed at [facility], [address], [place], [place] [address]    Report Status PENDING  Incomplete  Culture, blood (Routine x 2)     Status: None (Preliminary result)   Collection Time: 03/04/22 10:23 PM   Specimen: BLOOD  Result Value Ref Range Status   Specimen Description   Final    BLOOD BLOOD LEFT ARM AEROBIC BOTTLE ONLY ANAEROBIC BOTTLE ONLY   Special Requests   Final    BOTTLES DRAWN AEROBIC AND ANAEROBIC Blood Culture adequate volume   Culture   Final    NO GROWTH 2 DAYS Performed at [facility], [address] [place], [place] [address]    Report Status PENDING  Incomplete  Resp panel by RT-PCR (RSV, Flu A&B, Covid) Anterior Nasal Swab     Status: None   Collection Time: 03/04/22 11:18 PM   Specimen: Anterior Nasal Swab  Result Value Ref Range Status   SARS Coronavirus 2 by RT PCR NEGATIVE NEGATIVE Final    Comment: (NOTE) SARS-CoV-2 target nucleic acids are NOT  DETECTED.  The SARS-CoV-2 RNA is generally detectable in upper respiratory specimens during the acute phase of infection. The lowest concentration of SARS-CoV-2 viral copies this assay can detect is 138 copies/mL. A negative result does not preclude SARS-Cov-2 infection and should not be used as the sole basis for treatment or other patient management decisions. A negative result may occur with  improper specimen collection/handling, submission of specimen other than nasopharyngeal swab, presence of viral mutation(s) within the areas targeted by this assay, and inadequate number of viral copies(<138 copies/mL). A negative result must be combined with clinical observations, patient history, and epidemiological information. The expected result is Negative.  Fact Sheet for Patients:  EntrepreneurPulse.com.au  Fact Sheet for Healthcare Providers:  IncredibleEmployment.be  This test is no t yet approved or cleared by the [place] FDA and  has been authorized for detection and/or diagnosis of SARS-CoV-2 by FDA under an Emergency Use Authorization (EUA). This EUA will remain  in effect (meaning this test can be used) for the duration of the COVID-19 declaration under Section 564(b)(1) of the Act, 21 U.S.C.section 360bbb-3(b)(1), unless the authorization is terminated  or revoked sooner.  Influenza A by PCR NEGATIVE NEGATIVE Final   Influenza B by PCR NEGATIVE NEGATIVE Final    Comment: (NOTE) The Xpert Xpress SARS-CoV-2/FLU/RSV plus assay is intended as an aid in the diagnosis of influenza from Nasopharyngeal swab specimens and should not be used as a sole basis for treatment. Nasal washings and aspirates are unacceptable for Xpert Xpress SARS-CoV-2/FLU/RSV testing.  Fact Sheet for Patients: EntrepreneurPulse.com.au  Fact Sheet for Healthcare Providers: IncredibleEmployment.be  This test is not yet  approved or cleared by the [place] FDA and has been authorized for detection and/or diagnosis of SARS-CoV-2 by FDA under an Emergency Use Authorization (EUA). This EUA will remain in effect (meaning this test can be used) for the duration of the COVID-19 declaration under Section 564(b)(1) of the Act, 21 U.S.C. section 360bbb-3(b)(1), unless the authorization is terminated or revoked.     Resp Syncytial Virus by PCR NEGATIVE NEGATIVE Final    Comment: (NOTE) Fact Sheet for Patients: EntrepreneurPulse.com.au  Fact Sheet for Healthcare Providers: IncredibleEmployment.be  This test is not yet approved or cleared by the [place] FDA and has been authorized for detection and/or diagnosis of SARS-CoV-2 by FDA under an Emergency Use Authorization (EUA). This EUA will remain in effect (meaning this test can be used) for the duration of the COVID-19 declaration under Section 564(b)(1) of the Act, 21 U.S.C. section 360bbb-3(b)(1), unless the authorization is terminated or revoked.  Performed at [facility], [address] [place], [place] [address]   Urine Culture     Status: None   Collection Time: 03/05/22  3:25 AM   Specimen: Urine, Random  Result Value Ref Range Status   Specimen Description   Final    URINE, RANDOM Performed at [facility], [address] [place], [place] [address]    Special Requests   Final    NONE Performed at [facility], [address], [place], [place] [address]    Culture   Final    NO GROWTH Performed at [facility], [place] [address], [place], [place] [address]    Report Status 03/06/2022 FINAL  Final    Labs: CBC: Recent Labs  Lab 03/04/22 2205 03/05/22 0518  WBC 10.7* 6.8  NEUTROABS 7.9*  --   HGB 12.2 9.9*  HCT 37.4 31.3*  MCV 93.0 94.3  PLT 188 [id number]*   Basic Metabolic Panel: Recent Labs  Lab 03/04/22 2205 03/05/22 0518  NA 135  --   K 4.3  --    CL 102  --   CO2 24  --   GLUCOSE 113*  --   BUN 13  --   CREATININE 0.92 0.80  CALCIUM 9.3  --    Liver Function Tests: Recent Labs  Lab 03/04/22 2205  AST 20  ALT 16  ALKPHOS 90  BILITOT 0.5  PROT 8.0  ALBUMIN 4.2   CBG: Recent Labs  Lab 03/05/22 1127 03/05/22 1633 03/05/22 2130 03/06/22 0757 03/06/22 1106  GLUCAP 147* 107* 108* 110* 135*    Discharge time spent: less than 30 minutes.  Signed: Ezekiel Slocumb, DO [facility] 03/06/2022

## 2022-03-06 NOTE — Progress Notes (Signed)
Patient alert and oriented, vss, no complaints of pain.  Escorted out of hospital via wheelchair by volunteers.  D/C PIV.

## 2022-03-06 NOTE — Assessment & Plan Note (Signed)
For Non-Hodgkins lymphoma

## 2022-03-09 LAB — CULTURE, BLOOD (ROUTINE X 2)
Culture: NO GROWTH
Culture: NO GROWTH
Special Requests: ADEQUATE
Special Requests: ADEQUATE

## 2022-03-10 DIAGNOSIS — J45901 Unspecified asthma with (acute) exacerbation: Secondary | ICD-10-CM | POA: Diagnosis not present

## 2022-03-12 DIAGNOSIS — J189 Pneumonia, unspecified organism: Secondary | ICD-10-CM | POA: Diagnosis not present

## 2022-03-12 DIAGNOSIS — J432 Centrilobular emphysema: Secondary | ICD-10-CM | POA: Diagnosis not present

## 2022-03-31 DIAGNOSIS — Z9181 History of falling: Secondary | ICD-10-CM | POA: Diagnosis not present

## 2022-03-31 DIAGNOSIS — F17210 Nicotine dependence, cigarettes, uncomplicated: Secondary | ICD-10-CM | POA: Diagnosis not present

## 2022-03-31 DIAGNOSIS — E559 Vitamin D deficiency, unspecified: Secondary | ICD-10-CM | POA: Diagnosis not present

## 2022-03-31 DIAGNOSIS — F1120 Opioid dependence, uncomplicated: Secondary | ICD-10-CM | POA: Diagnosis not present

## 2022-03-31 DIAGNOSIS — C8590 Non-Hodgkin lymphoma, unspecified, unspecified site: Secondary | ICD-10-CM | POA: Diagnosis not present

## 2022-03-31 DIAGNOSIS — Z79899 Other long term (current) drug therapy: Secondary | ICD-10-CM | POA: Diagnosis not present

## 2022-03-31 DIAGNOSIS — E119 Type 2 diabetes mellitus without complications: Secondary | ICD-10-CM | POA: Diagnosis not present

## 2022-03-31 DIAGNOSIS — Z6823 Body mass index (BMI) 23.0-23.9, adult: Secondary | ICD-10-CM | POA: Diagnosis not present

## 2022-04-02 DIAGNOSIS — Z79899 Other long term (current) drug therapy: Secondary | ICD-10-CM | POA: Diagnosis not present

## 2022-04-08 DIAGNOSIS — J45901 Unspecified asthma with (acute) exacerbation: Secondary | ICD-10-CM | POA: Diagnosis not present

## 2022-04-10 DIAGNOSIS — J432 Centrilobular emphysema: Secondary | ICD-10-CM | POA: Diagnosis not present

## 2022-04-10 DIAGNOSIS — J189 Pneumonia, unspecified organism: Secondary | ICD-10-CM | POA: Diagnosis not present

## 2022-04-14 DIAGNOSIS — C8331 Diffuse large B-cell lymphoma, lymph nodes of head, face, and neck: Secondary | ICD-10-CM | POA: Diagnosis not present

## 2022-04-28 DIAGNOSIS — F1120 Opioid dependence, uncomplicated: Secondary | ICD-10-CM | POA: Diagnosis not present

## 2022-04-28 DIAGNOSIS — Z9181 History of falling: Secondary | ICD-10-CM | POA: Diagnosis not present

## 2022-04-28 DIAGNOSIS — E559 Vitamin D deficiency, unspecified: Secondary | ICD-10-CM | POA: Diagnosis not present

## 2022-04-28 DIAGNOSIS — Z6823 Body mass index (BMI) 23.0-23.9, adult: Secondary | ICD-10-CM | POA: Diagnosis not present

## 2022-04-28 DIAGNOSIS — F17210 Nicotine dependence, cigarettes, uncomplicated: Secondary | ICD-10-CM | POA: Diagnosis not present

## 2022-04-28 DIAGNOSIS — C8331 Diffuse large B-cell lymphoma, lymph nodes of head, face, and neck: Secondary | ICD-10-CM | POA: Diagnosis not present

## 2022-04-28 DIAGNOSIS — C8590 Non-Hodgkin lymphoma, unspecified, unspecified site: Secondary | ICD-10-CM | POA: Diagnosis not present

## 2022-04-28 DIAGNOSIS — Z79899 Other long term (current) drug therapy: Secondary | ICD-10-CM | POA: Diagnosis not present

## 2022-04-28 DIAGNOSIS — E119 Type 2 diabetes mellitus without complications: Secondary | ICD-10-CM | POA: Diagnosis not present

## 2022-04-28 DIAGNOSIS — M47812 Spondylosis without myelopathy or radiculopathy, cervical region: Secondary | ICD-10-CM | POA: Diagnosis not present

## 2022-04-30 DIAGNOSIS — Z79899 Other long term (current) drug therapy: Secondary | ICD-10-CM | POA: Diagnosis not present

## 2022-05-05 DIAGNOSIS — R21 Rash and other nonspecific skin eruption: Secondary | ICD-10-CM | POA: Diagnosis not present

## 2022-05-05 DIAGNOSIS — J449 Chronic obstructive pulmonary disease, unspecified: Secondary | ICD-10-CM | POA: Diagnosis not present

## 2022-05-05 DIAGNOSIS — M545 Low back pain, unspecified: Secondary | ICD-10-CM | POA: Diagnosis not present

## 2022-05-05 DIAGNOSIS — E119 Type 2 diabetes mellitus without complications: Secondary | ICD-10-CM | POA: Diagnosis not present

## 2022-05-05 DIAGNOSIS — I10 Essential (primary) hypertension: Secondary | ICD-10-CM | POA: Diagnosis not present

## 2022-05-08 DIAGNOSIS — H25011 Cortical age-related cataract, right eye: Secondary | ICD-10-CM | POA: Diagnosis not present

## 2022-05-08 DIAGNOSIS — H25041 Posterior subcapsular polar age-related cataract, right eye: Secondary | ICD-10-CM | POA: Diagnosis not present

## 2022-05-08 DIAGNOSIS — H2511 Age-related nuclear cataract, right eye: Secondary | ICD-10-CM | POA: Diagnosis not present

## 2022-05-08 DIAGNOSIS — Z961 Presence of intraocular lens: Secondary | ICD-10-CM | POA: Diagnosis not present

## 2022-05-11 DIAGNOSIS — J432 Centrilobular emphysema: Secondary | ICD-10-CM | POA: Diagnosis not present

## 2022-05-11 DIAGNOSIS — J189 Pneumonia, unspecified organism: Secondary | ICD-10-CM | POA: Diagnosis not present

## 2022-05-26 DIAGNOSIS — H903 Sensorineural hearing loss, bilateral: Secondary | ICD-10-CM | POA: Diagnosis not present

## 2022-05-27 DIAGNOSIS — E559 Vitamin D deficiency, unspecified: Secondary | ICD-10-CM | POA: Diagnosis not present

## 2022-05-27 DIAGNOSIS — C8590 Non-Hodgkin lymphoma, unspecified, unspecified site: Secondary | ICD-10-CM | POA: Diagnosis not present

## 2022-05-27 DIAGNOSIS — F17210 Nicotine dependence, cigarettes, uncomplicated: Secondary | ICD-10-CM | POA: Diagnosis not present

## 2022-05-27 DIAGNOSIS — E119 Type 2 diabetes mellitus without complications: Secondary | ICD-10-CM | POA: Diagnosis not present

## 2022-05-27 DIAGNOSIS — F1120 Opioid dependence, uncomplicated: Secondary | ICD-10-CM | POA: Diagnosis not present

## 2022-05-27 DIAGNOSIS — Z79899 Other long term (current) drug therapy: Secondary | ICD-10-CM | POA: Diagnosis not present

## 2022-05-27 DIAGNOSIS — Z9181 History of falling: Secondary | ICD-10-CM | POA: Diagnosis not present

## 2022-05-29 DIAGNOSIS — Z79899 Other long term (current) drug therapy: Secondary | ICD-10-CM | POA: Diagnosis not present

## 2022-06-11 ENCOUNTER — Other Ambulatory Visit: Payer: Self-pay | Admitting: Family Medicine

## 2022-06-12 DIAGNOSIS — C8331 Diffuse large B-cell lymphoma, lymph nodes of head, face, and neck: Secondary | ICD-10-CM | POA: Diagnosis not present

## 2022-06-26 DIAGNOSIS — R161 Splenomegaly, not elsewhere classified: Secondary | ICD-10-CM | POA: Diagnosis not present

## 2022-06-26 DIAGNOSIS — C8331 Diffuse large B-cell lymphoma, lymph nodes of head, face, and neck: Secondary | ICD-10-CM | POA: Diagnosis not present

## 2022-06-26 DIAGNOSIS — R918 Other nonspecific abnormal finding of lung field: Secondary | ICD-10-CM | POA: Diagnosis not present

## 2022-06-26 DIAGNOSIS — K8689 Other specified diseases of pancreas: Secondary | ICD-10-CM | POA: Diagnosis not present

## 2022-06-30 DIAGNOSIS — Z9181 History of falling: Secondary | ICD-10-CM | POA: Diagnosis not present

## 2022-06-30 DIAGNOSIS — R8290 Unspecified abnormal findings in urine: Secondary | ICD-10-CM | POA: Diagnosis not present

## 2022-06-30 DIAGNOSIS — E119 Type 2 diabetes mellitus without complications: Secondary | ICD-10-CM | POA: Diagnosis not present

## 2022-06-30 DIAGNOSIS — Z6823 Body mass index (BMI) 23.0-23.9, adult: Secondary | ICD-10-CM | POA: Diagnosis not present

## 2022-06-30 DIAGNOSIS — F1120 Opioid dependence, uncomplicated: Secondary | ICD-10-CM | POA: Diagnosis not present

## 2022-06-30 DIAGNOSIS — Z79899 Other long term (current) drug therapy: Secondary | ICD-10-CM | POA: Diagnosis not present

## 2022-06-30 DIAGNOSIS — F17210 Nicotine dependence, cigarettes, uncomplicated: Secondary | ICD-10-CM | POA: Diagnosis not present

## 2022-06-30 DIAGNOSIS — R338 Other retention of urine: Secondary | ICD-10-CM | POA: Diagnosis not present

## 2022-06-30 DIAGNOSIS — C8590 Non-Hodgkin lymphoma, unspecified, unspecified site: Secondary | ICD-10-CM | POA: Diagnosis not present

## 2022-06-30 DIAGNOSIS — R42 Dizziness and giddiness: Secondary | ICD-10-CM | POA: Diagnosis not present

## 2022-07-03 DIAGNOSIS — Z79899 Other long term (current) drug therapy: Secondary | ICD-10-CM | POA: Diagnosis not present

## 2022-07-09 ENCOUNTER — Other Ambulatory Visit: Payer: Self-pay

## 2022-07-14 DIAGNOSIS — H903 Sensorineural hearing loss, bilateral: Secondary | ICD-10-CM | POA: Diagnosis not present

## 2022-07-16 ENCOUNTER — Encounter: Payer: Self-pay | Admitting: *Deleted

## 2022-07-21 DIAGNOSIS — E119 Type 2 diabetes mellitus without complications: Secondary | ICD-10-CM | POA: Diagnosis not present

## 2022-07-21 DIAGNOSIS — Z9181 History of falling: Secondary | ICD-10-CM | POA: Diagnosis not present

## 2022-07-21 DIAGNOSIS — F17210 Nicotine dependence, cigarettes, uncomplicated: Secondary | ICD-10-CM | POA: Diagnosis not present

## 2022-07-21 DIAGNOSIS — Z79899 Other long term (current) drug therapy: Secondary | ICD-10-CM | POA: Diagnosis not present

## 2022-07-21 DIAGNOSIS — E559 Vitamin D deficiency, unspecified: Secondary | ICD-10-CM | POA: Diagnosis not present

## 2022-07-21 DIAGNOSIS — Z6823 Body mass index (BMI) 23.0-23.9, adult: Secondary | ICD-10-CM | POA: Diagnosis not present

## 2022-07-21 DIAGNOSIS — C8590 Non-Hodgkin lymphoma, unspecified, unspecified site: Secondary | ICD-10-CM | POA: Diagnosis not present

## 2022-07-21 DIAGNOSIS — F1120 Opioid dependence, uncomplicated: Secondary | ICD-10-CM | POA: Diagnosis not present

## 2022-07-21 DIAGNOSIS — M129 Arthropathy, unspecified: Secondary | ICD-10-CM | POA: Diagnosis not present

## 2022-07-23 DIAGNOSIS — Z79899 Other long term (current) drug therapy: Secondary | ICD-10-CM | POA: Diagnosis not present

## 2022-07-28 DIAGNOSIS — R1011 Right upper quadrant pain: Secondary | ICD-10-CM | POA: Diagnosis not present

## 2022-07-31 ENCOUNTER — Other Ambulatory Visit: Payer: Self-pay | Admitting: Family

## 2022-07-31 ENCOUNTER — Other Ambulatory Visit: Payer: Self-pay | Admitting: Internal Medicine

## 2022-07-31 DIAGNOSIS — K8020 Calculus of gallbladder without cholecystitis without obstruction: Secondary | ICD-10-CM

## 2022-08-01 NOTE — Telephone Encounter (Signed)
Requested Prescriptions  Refused Prescriptions Disp Refills   TRELEGY ELLIPTA 100-62.5-25 MCG/ACT AEPB [Pharmacy Med Name: TRELEGY ELLIPTA 100-62.5-25] 60 each 0    Sig: Take 1 inhalation orally once a day for 90 day(s)     Off-Protocol Failed - 07/31/2022 10:33 AM      Failed - Medication not assigned to a protocol, review manually.      Failed - Valid encounter within last 12 months    Recent Outpatient Visits           4 years ago Medicare annual wellness visit, subsequent   Primary Care at [facility], Asencion Partridge, MD   5 years ago Loss of weight   Primary [facility] at [facility], Marolyn Hammock, PA-C   5 years ago Dysuria   Primary Care at [facility], Marolyn Hammock, PA-C   6 years ago Gastroesophageal reflux disease with esophagitis   [facility] at [facility], Marolyn Hammock, PA-C   6 years ago Gross hematuria   Primary Care at [facility], Marolyn Hammock, PA-C

## 2022-08-04 DIAGNOSIS — H2511 Age-related nuclear cataract, right eye: Secondary | ICD-10-CM | POA: Diagnosis not present

## 2022-08-11 ENCOUNTER — Ambulatory Visit: Payer: PPO

## 2022-08-12 DIAGNOSIS — H2511 Age-related nuclear cataract, right eye: Secondary | ICD-10-CM | POA: Diagnosis not present

## 2022-08-18 DIAGNOSIS — Z9181 History of falling: Secondary | ICD-10-CM | POA: Diagnosis not present

## 2022-08-18 DIAGNOSIS — E559 Vitamin D deficiency, unspecified: Secondary | ICD-10-CM | POA: Diagnosis not present

## 2022-08-18 DIAGNOSIS — Z79899 Other long term (current) drug therapy: Secondary | ICD-10-CM | POA: Diagnosis not present

## 2022-08-18 DIAGNOSIS — F17210 Nicotine dependence, cigarettes, uncomplicated: Secondary | ICD-10-CM | POA: Diagnosis not present

## 2022-08-18 DIAGNOSIS — Z6823 Body mass index (BMI) 23.0-23.9, adult: Secondary | ICD-10-CM | POA: Diagnosis not present

## 2022-08-18 DIAGNOSIS — E119 Type 2 diabetes mellitus without complications: Secondary | ICD-10-CM | POA: Diagnosis not present

## 2022-08-18 DIAGNOSIS — R768 Other specified abnormal immunological findings in serum: Secondary | ICD-10-CM | POA: Diagnosis not present

## 2022-08-18 DIAGNOSIS — F1120 Opioid dependence, uncomplicated: Secondary | ICD-10-CM | POA: Diagnosis not present

## 2022-08-18 DIAGNOSIS — C8590 Non-Hodgkin lymphoma, unspecified, unspecified site: Secondary | ICD-10-CM | POA: Diagnosis not present

## 2022-08-20 DIAGNOSIS — Z79899 Other long term (current) drug therapy: Secondary | ICD-10-CM | POA: Diagnosis not present

## 2022-08-22 DIAGNOSIS — Z7984 Long term (current) use of oral hypoglycemic drugs: Secondary | ICD-10-CM | POA: Diagnosis not present

## 2022-08-22 DIAGNOSIS — Z6824 Body mass index (BMI) 24.0-24.9, adult: Secondary | ICD-10-CM | POA: Diagnosis not present

## 2022-08-22 DIAGNOSIS — C8330 Diffuse large B-cell lymphoma, unspecified site: Secondary | ICD-10-CM | POA: Diagnosis not present

## 2022-08-22 DIAGNOSIS — I10 Essential (primary) hypertension: Secondary | ICD-10-CM | POA: Diagnosis not present

## 2022-08-22 DIAGNOSIS — E119 Type 2 diabetes mellitus without complications: Secondary | ICD-10-CM | POA: Diagnosis not present

## 2022-08-22 DIAGNOSIS — D692 Other nonthrombocytopenic purpura: Secondary | ICD-10-CM | POA: Diagnosis not present

## 2022-08-22 DIAGNOSIS — Z515 Encounter for palliative care: Secondary | ICD-10-CM | POA: Diagnosis not present

## 2022-08-26 ENCOUNTER — Encounter: Payer: Self-pay | Admitting: Pharmacist

## 2022-08-26 NOTE — Progress Notes (Signed)
Pharmacy Quality Measure Review  This patient is appearing on a report for being at risk of failing the adherence measure for diabetes medications this calendar year.   Medication: [place] Pepper  Last fill date: 05/26/2022 for 30 day supply - per [facility] report.   Patient also has not been seen at assigned PCP office - [facility].  Per PCP is Drusilla Kanner, FNP in [place], [place].   Spoke with patient and verified per PCP is Drusilla Kanner, FNP. Will submit reattribution request.   Also checked Dr Tiajuana Amass. Database and patient filled Jardiance last 07/30/2022 for 30 days. Will be due in a few days again but current [facility] for 2024 is 68%

## 2022-09-16 DIAGNOSIS — Z79899 Other long term (current) drug therapy: Secondary | ICD-10-CM | POA: Diagnosis not present

## 2022-09-16 DIAGNOSIS — R768 Other specified abnormal immunological findings in serum: Secondary | ICD-10-CM | POA: Diagnosis not present

## 2022-09-16 DIAGNOSIS — C8590 Non-Hodgkin lymphoma, unspecified, unspecified site: Secondary | ICD-10-CM | POA: Diagnosis not present

## 2022-09-16 DIAGNOSIS — E119 Type 2 diabetes mellitus without complications: Secondary | ICD-10-CM | POA: Diagnosis not present

## 2022-09-16 DIAGNOSIS — E559 Vitamin D deficiency, unspecified: Secondary | ICD-10-CM | POA: Diagnosis not present

## 2022-09-16 DIAGNOSIS — F1120 Opioid dependence, uncomplicated: Secondary | ICD-10-CM | POA: Diagnosis not present

## 2022-09-16 DIAGNOSIS — Z9181 History of falling: Secondary | ICD-10-CM | POA: Diagnosis not present

## 2022-09-16 DIAGNOSIS — Z6823 Body mass index (BMI) 23.0-23.9, adult: Secondary | ICD-10-CM | POA: Diagnosis not present

## 2022-09-16 DIAGNOSIS — F17210 Nicotine dependence, cigarettes, uncomplicated: Secondary | ICD-10-CM | POA: Diagnosis not present

## 2022-09-18 DIAGNOSIS — Z79899 Other long term (current) drug therapy: Secondary | ICD-10-CM | POA: Diagnosis not present

## 2022-10-07 DIAGNOSIS — M79641 Pain in right hand: Secondary | ICD-10-CM | POA: Diagnosis not present

## 2022-10-07 DIAGNOSIS — R8290 Unspecified abnormal findings in urine: Secondary | ICD-10-CM | POA: Diagnosis not present

## 2022-10-07 DIAGNOSIS — N390 Urinary tract infection, site not specified: Secondary | ICD-10-CM | POA: Diagnosis not present

## 2022-10-07 DIAGNOSIS — R0602 Shortness of breath: Secondary | ICD-10-CM | POA: Diagnosis not present

## 2022-10-07 DIAGNOSIS — F419 Anxiety disorder, unspecified: Secondary | ICD-10-CM | POA: Diagnosis not present

## 2022-10-07 DIAGNOSIS — E119 Type 2 diabetes mellitus without complications: Secondary | ICD-10-CM | POA: Diagnosis not present

## 2022-10-07 DIAGNOSIS — C8588 Other specified types of non-Hodgkin lymphoma, lymph nodes of multiple sites: Secondary | ICD-10-CM | POA: Diagnosis not present

## 2022-10-07 DIAGNOSIS — I10 Essential (primary) hypertension: Secondary | ICD-10-CM | POA: Diagnosis not present

## 2022-10-07 DIAGNOSIS — E559 Vitamin D deficiency, unspecified: Secondary | ICD-10-CM | POA: Diagnosis not present

## 2022-10-07 DIAGNOSIS — Z23 Encounter for immunization: Secondary | ICD-10-CM | POA: Diagnosis not present

## 2022-10-07 DIAGNOSIS — J449 Chronic obstructive pulmonary disease, unspecified: Secondary | ICD-10-CM | POA: Diagnosis not present

## 2022-10-07 DIAGNOSIS — Z720 Tobacco use: Secondary | ICD-10-CM | POA: Diagnosis not present

## 2022-10-11 ENCOUNTER — Encounter: Payer: Self-pay | Admitting: Internal Medicine

## 2022-10-11 ENCOUNTER — Inpatient Hospital Stay
Admission: EM | Admit: 2022-10-11 | Discharge: 2022-10-14 | DRG: 871 | Disposition: A | Discharge status: Home / Self Care | Payer: PPO | Attending: Hospitalist | Admitting: Hospitalist

## 2022-10-11 ENCOUNTER — Other Ambulatory Visit: Payer: Self-pay

## 2022-10-11 ENCOUNTER — Inpatient Hospital Stay: Payer: PPO

## 2022-10-11 ENCOUNTER — Emergency Department: Payer: PPO

## 2022-10-11 DIAGNOSIS — Y92009 Unspecified place in unspecified non-institutional (private) residence as the place of occurrence of the external cause: Secondary | ICD-10-CM | POA: Diagnosis not present

## 2022-10-11 DIAGNOSIS — N170 Acute kidney failure with tubular necrosis: Secondary | ICD-10-CM | POA: Diagnosis present

## 2022-10-11 DIAGNOSIS — R109 Unspecified abdominal pain: Secondary | ICD-10-CM | POA: Diagnosis present

## 2022-10-11 DIAGNOSIS — E871 Hypo-osmolality and hyponatremia: Secondary | ICD-10-CM | POA: Diagnosis not present

## 2022-10-11 DIAGNOSIS — E785 Hyperlipidemia, unspecified: Secondary | ICD-10-CM | POA: Diagnosis not present

## 2022-10-11 DIAGNOSIS — Z91040 Latex allergy status: Secondary | ICD-10-CM

## 2022-10-11 DIAGNOSIS — F32A Depression, unspecified: Secondary | ICD-10-CM | POA: Diagnosis present

## 2022-10-11 DIAGNOSIS — I110 Hypertensive heart disease with heart failure: Secondary | ICD-10-CM | POA: Diagnosis not present

## 2022-10-11 DIAGNOSIS — Z7984 Long term (current) use of oral hypoglycemic drugs: Secondary | ICD-10-CM

## 2022-10-11 DIAGNOSIS — W19XXXA Unspecified fall, initial encounter: Secondary | ICD-10-CM | POA: Diagnosis not present

## 2022-10-11 DIAGNOSIS — Z923 Personal history of irradiation: Secondary | ICD-10-CM

## 2022-10-11 DIAGNOSIS — E872 Acidosis, unspecified: Secondary | ICD-10-CM | POA: Diagnosis not present

## 2022-10-11 DIAGNOSIS — J432 Centrilobular emphysema: Secondary | ICD-10-CM | POA: Diagnosis not present

## 2022-10-11 DIAGNOSIS — E119 Type 2 diabetes mellitus without complications: Secondary | ICD-10-CM | POA: Diagnosis not present

## 2022-10-11 DIAGNOSIS — G8929 Other chronic pain: Secondary | ICD-10-CM | POA: Diagnosis not present

## 2022-10-11 DIAGNOSIS — I959 Hypotension, unspecified: Secondary | ICD-10-CM | POA: Diagnosis not present

## 2022-10-11 DIAGNOSIS — Z8249 Family history of ischemic heart disease and other diseases of the circulatory system: Secondary | ICD-10-CM | POA: Diagnosis not present

## 2022-10-11 DIAGNOSIS — K219 Gastro-esophageal reflux disease without esophagitis: Secondary | ICD-10-CM | POA: Diagnosis not present

## 2022-10-11 DIAGNOSIS — R6521 Severe sepsis with septic shock: Secondary | ICD-10-CM | POA: Diagnosis not present

## 2022-10-11 DIAGNOSIS — C8331 Diffuse large B-cell lymphoma, lymph nodes of head, face, and neck: Secondary | ICD-10-CM | POA: Diagnosis not present

## 2022-10-11 DIAGNOSIS — Z9484 Stem cells transplant status: Secondary | ICD-10-CM | POA: Diagnosis not present

## 2022-10-11 DIAGNOSIS — Z9221 Personal history of antineoplastic chemotherapy: Secondary | ICD-10-CM

## 2022-10-11 DIAGNOSIS — Z716 Tobacco abuse counseling: Secondary | ICD-10-CM

## 2022-10-11 DIAGNOSIS — Z801 Family history of malignant neoplasm of trachea, bronchus and lung: Secondary | ICD-10-CM

## 2022-10-11 DIAGNOSIS — A419 Sepsis, unspecified organism: Secondary | ICD-10-CM | POA: Diagnosis not present

## 2022-10-11 DIAGNOSIS — N390 Urinary tract infection, site not specified: Secondary | ICD-10-CM | POA: Diagnosis not present

## 2022-10-11 DIAGNOSIS — Z79899 Other long term (current) drug therapy: Secondary | ICD-10-CM

## 2022-10-11 DIAGNOSIS — N6341 Unspecified lump in right breast, subareolar: Secondary | ICD-10-CM | POA: Diagnosis not present

## 2022-10-11 DIAGNOSIS — I10 Essential (primary) hypertension: Secondary | ICD-10-CM | POA: Diagnosis present

## 2022-10-11 DIAGNOSIS — E876 Hypokalemia: Secondary | ICD-10-CM | POA: Diagnosis present

## 2022-10-11 DIAGNOSIS — R079 Chest pain, unspecified: Secondary | ICD-10-CM | POA: Diagnosis not present

## 2022-10-11 DIAGNOSIS — I700 Atherosclerosis of aorta: Secondary | ICD-10-CM | POA: Diagnosis not present

## 2022-10-11 DIAGNOSIS — Z1152 Encounter for screening for COVID-19: Secondary | ICD-10-CM | POA: Diagnosis not present

## 2022-10-11 DIAGNOSIS — Z833 Family history of diabetes mellitus: Secondary | ICD-10-CM

## 2022-10-11 DIAGNOSIS — Z791 Long term (current) use of non-steroidal anti-inflammatories (NSAID): Secondary | ICD-10-CM

## 2022-10-11 DIAGNOSIS — R6520 Severe sepsis without septic shock: Secondary | ICD-10-CM

## 2022-10-11 DIAGNOSIS — F419 Anxiety disorder, unspecified: Secondary | ICD-10-CM | POA: Diagnosis present

## 2022-10-11 DIAGNOSIS — F17210 Nicotine dependence, cigarettes, uncomplicated: Secondary | ICD-10-CM | POA: Diagnosis present

## 2022-10-11 DIAGNOSIS — Z8616 Personal history of COVID-19: Secondary | ICD-10-CM

## 2022-10-11 DIAGNOSIS — Z79891 Long term (current) use of opiate analgesic: Secondary | ICD-10-CM

## 2022-10-11 DIAGNOSIS — Z7951 Long term (current) use of inhaled steroids: Secondary | ICD-10-CM

## 2022-10-11 DIAGNOSIS — N179 Acute kidney failure, unspecified: Secondary | ICD-10-CM

## 2022-10-11 DIAGNOSIS — I5032 Chronic diastolic (congestive) heart failure: Secondary | ICD-10-CM | POA: Diagnosis present

## 2022-10-11 DIAGNOSIS — R531 Weakness: Secondary | ICD-10-CM | POA: Diagnosis not present

## 2022-10-11 DIAGNOSIS — Z91048 Other nonmedicinal substance allergy status: Secondary | ICD-10-CM

## 2022-10-11 DIAGNOSIS — I5030 Unspecified diastolic (congestive) heart failure: Secondary | ICD-10-CM | POA: Insufficient documentation

## 2022-10-11 DIAGNOSIS — J449 Chronic obstructive pulmonary disease, unspecified: Secondary | ICD-10-CM | POA: Diagnosis not present

## 2022-10-11 DIAGNOSIS — Z79818 Long term (current) use of other agents affecting estrogen receptors and estrogen levels: Secondary | ICD-10-CM

## 2022-10-11 DIAGNOSIS — F17200 Nicotine dependence, unspecified, uncomplicated: Secondary | ICD-10-CM | POA: Diagnosis present

## 2022-10-11 DIAGNOSIS — Z886 Allergy status to analgesic agent status: Secondary | ICD-10-CM

## 2022-10-11 DIAGNOSIS — R7401 Elevation of levels of liver transaminase levels: Secondary | ICD-10-CM | POA: Insufficient documentation

## 2022-10-11 DIAGNOSIS — M7918 Myalgia, other site: Secondary | ICD-10-CM

## 2022-10-11 DIAGNOSIS — Z8051 Family history of malignant neoplasm of kidney: Secondary | ICD-10-CM

## 2022-10-11 LAB — URINALYSIS, W/ REFLEX TO CULTURE (INFECTION SUSPECTED)
Bilirubin Urine: NEGATIVE
Glucose, UA: 150 mg/dL — AB
Ketones, ur: NEGATIVE mg/dL
Nitrite: NEGATIVE
Protein, ur: NEGATIVE mg/dL
Specific Gravity, Urine: 1.012 (ref 1.005–1.030)
pH: 5 (ref 5.0–8.0)

## 2022-10-11 LAB — HEPATIC FUNCTION PANEL
ALT: 25 U/L (ref 0–44)
AST: 45 U/L — ABNORMAL HIGH (ref 15–41)
Albumin: 4.3 g/dL (ref 3.5–5.0)
Alkaline Phosphatase: 65 U/L (ref 38–126)
Bilirubin, Direct: <0.1 mg/dL (ref 0.0–0.2)
Total Bilirubin: 0.7 mg/dL (ref 0.3–1.2)
Total Protein: 7.8 g/dL (ref 6.5–8.1)

## 2022-10-11 LAB — PROCALCITONIN: Procalcitonin: <0.1 ng/mL

## 2022-10-11 LAB — CBC
HCT: 45 % (ref 36.0–46.0)
Hemoglobin: 14.6 g/dL (ref 12.0–15.0)
MCH: 29.8 pg (ref 26.0–34.0)
MCHC: 32.4 g/dL (ref 30.0–36.0)
MCV: 91.8 fL (ref 80.0–100.0)
Platelets: 210 10*3/uL (ref 150–400)
RBC: 4.9 MIL/uL (ref 3.87–5.11)
RDW: 14.4 % (ref 11.5–15.5)
WBC: 13.2 10*3/uL — ABNORMAL HIGH (ref 4.0–10.5)
nRBC: 0 % (ref 0.0–0.2)

## 2022-10-11 LAB — BASIC METABOLIC PANEL
Anion gap: 15 (ref 5–15)
BUN: 56 mg/dL — ABNORMAL HIGH (ref 8–23)
CO2: 19 mmol/L — ABNORMAL LOW (ref 22–32)
Calcium: 9.3 mg/dL (ref 8.9–10.3)
Chloride: 98 mmol/L (ref 98–111)
Creatinine, Ser: 1.83 mg/dL — ABNORMAL HIGH (ref 0.44–1.00)
GFR, Estimated: 29 mL/min — ABNORMAL LOW (ref >60)
Glucose, Bld: 181 mg/dL — ABNORMAL HIGH (ref 70–99)
Potassium: 3.2 mmol/L — ABNORMAL LOW (ref 3.5–5.1)
Sodium: 132 mmol/L — ABNORMAL LOW (ref 135–145)

## 2022-10-11 LAB — CBG MONITORING, ED
Glucose-Capillary: 89 mg/dL (ref 70–99)
Glucose-Capillary: 179 mg/dL — ABNORMAL HIGH (ref 70–99)

## 2022-10-11 LAB — RESP PANEL BY RT-PCR (RSV, FLU A&B, COVID)  RVPGX2
Influenza A by PCR: NEGATIVE
Influenza B by PCR: NEGATIVE
Resp Syncytial Virus by PCR: NEGATIVE
SARS Coronavirus 2 by RT PCR: NEGATIVE

## 2022-10-11 LAB — LACTIC ACID, PLASMA
Lactic Acid, Venous: 2.1 mmol/L (ref 0.5–1.9)
Lactic Acid, Venous: 2.1 mmol/L (ref 0.5–1.9)

## 2022-10-11 LAB — TSH: TSH: 5.039 u[IU]/mL — ABNORMAL HIGH (ref 0.350–4.500)

## 2022-10-11 LAB — LIPASE, BLOOD: Lipase: 44 U/L (ref 11–51)

## 2022-10-11 LAB — MAGNESIUM: Magnesium: 2.5 mg/dL — ABNORMAL HIGH (ref 1.7–2.4)

## 2022-10-11 MED ORDER — PANTOPRAZOLE SODIUM 40 MG PO TBEC
40.0000 mg | DELAYED_RELEASE_TABLET | Freq: Every day | ORAL | Status: DC
  Administered 2022-10-12 – 2022-10-14 (×3): 40 mg via ORAL
  Filled 2022-10-11 (×3): qty 1

## 2022-10-11 MED ORDER — FERROUS SULFATE 325 (65 FE) MG PO TABS
325.0000 mg | ORAL_TABLET | ORAL | Status: DC
  Administered 2022-10-13: 325 mg via ORAL
  Filled 2022-10-11 (×2): qty 1

## 2022-10-11 MED ORDER — NOREPINEPHRINE 4 MG/250ML-% IV SOLN
2.0000 ug/min | INTRAVENOUS | Status: DC
  Administered 2022-10-11: 2 ug/min via INTRAVENOUS
  Filled 2022-10-11: qty 250

## 2022-10-11 MED ORDER — LACTATED RINGERS IV SOLN
INTRAVENOUS | Status: AC
  Administered 2022-10-11: 150 mL/h via INTRAVENOUS

## 2022-10-11 MED ORDER — UMECLIDINIUM BROMIDE 62.5 MCG/ACT IN AEPB
1.0000 | INHALATION_SPRAY | Freq: Every day | RESPIRATORY_TRACT | Status: DC
Start: 2022-10-12 — End: 2022-10-11

## 2022-10-11 MED ORDER — SODIUM CHLORIDE 0.9 % IV BOLUS
1000.0000 mL | Freq: Once | INTRAVENOUS | Status: AC
  Administered 2022-10-11: 1000 mL via INTRAVENOUS

## 2022-10-11 MED ORDER — IPRATROPIUM-ALBUTEROL 0.5-2.5 (3) MG/3ML IN SOLN
RESPIRATORY_TRACT | Status: AC
  Administered 2022-10-11: 3 mL via RESPIRATORY_TRACT
  Filled 2022-10-11: qty 3

## 2022-10-11 MED ORDER — ONDANSETRON HCL 4 MG PO TABS: 4.0000 mg | ORAL_TABLET | Freq: Four times a day (QID) | ORAL | Status: DC | PRN

## 2022-10-11 MED ORDER — SODIUM CHLORIDE 0.9 % IV SOLN
2.0000 g | INTRAVENOUS | Status: DC
  Administered 2022-10-11: 2 g via INTRAVENOUS
  Filled 2022-10-11: qty 12.5

## 2022-10-11 MED ORDER — SODIUM CHLORIDE 0.9 % IV SOLN
250.0000 mL | INTRAVENOUS | Status: DC
  Administered 2022-10-12 – 2022-10-13 (×2): 250 mL via INTRAVENOUS

## 2022-10-11 MED ORDER — INSULIN ASPART 100 UNIT/ML IJ SOLN
0.0000 [IU] | Freq: Three times a day (TID) | INTRAMUSCULAR | Status: DC
  Administered 2022-10-12: 2 [IU] via SUBCUTANEOUS
  Administered 2022-10-12 (×2): 3 [IU] via SUBCUTANEOUS
  Administered 2022-10-13 (×2): 1 [IU] via SUBCUTANEOUS
  Filled 2022-10-11 (×5): qty 1

## 2022-10-11 MED ORDER — METHYLPREDNISOLONE SODIUM SUCC 40 MG IJ SOLR
40.0000 mg | Freq: Every day | INTRAMUSCULAR | Status: AC
  Administered 2022-10-11 – 2022-10-12 (×2): 40 mg via INTRAVENOUS
  Filled 2022-10-11 (×2): qty 1

## 2022-10-11 MED ORDER — VALACYCLOVIR HCL 500 MG PO TABS
1000.0000 mg | ORAL_TABLET | Freq: Every day | ORAL | Status: DC
  Administered 2022-10-12 – 2022-10-14 (×3): 1000 mg via ORAL
  Filled 2022-10-11 (×3): qty 2

## 2022-10-11 MED ORDER — NICOTINE 14 MG/24HR TD PT24
14.0000 mg | MEDICATED_PATCH | Freq: Once | TRANSDERMAL | Status: AC
  Administered 2022-10-11: 14 mg via TRANSDERMAL
  Filled 2022-10-11: qty 1

## 2022-10-11 MED ORDER — LACTATED RINGERS IV SOLN
150.0000 mL/h | INTRAVENOUS | Status: DC
  Administered 2022-10-11: 150 mL/h via INTRAVENOUS

## 2022-10-11 MED ORDER — BUPRENORPHINE HCL-NALOXONE HCL 8-2 MG SL SUBL
1.0000 | SUBLINGUAL_TABLET | Freq: Every day | SUBLINGUAL | Status: DC
  Administered 2022-10-12 – 2022-10-14 (×3): 1 via SUBLINGUAL
  Filled 2022-10-11 (×3): qty 1

## 2022-10-11 MED ORDER — NICOTINE 21 MG/24HR TD PT24: 21.0000 mg | MEDICATED_PATCH | Freq: Every day | TRANSDERMAL | Status: DC | PRN

## 2022-10-11 MED ORDER — ACETAMINOPHEN 325 MG PO TABS
650.0000 mg | ORAL_TABLET | Freq: Four times a day (QID) | ORAL | Status: DC | PRN
  Administered 2022-10-12 – 2022-10-13 (×3): 650 mg via ORAL
  Filled 2022-10-11 (×3): qty 2

## 2022-10-11 MED ORDER — ACETAMINOPHEN 650 MG RE SUPP: 650.0000 mg | Freq: Four times a day (QID) | RECTAL | Status: DC | PRN

## 2022-10-11 MED ORDER — NICOTINE POLACRILEX 2 MG MT GUM: 4.0000 mg | CHEWING_GUM | OROMUCOSAL | Status: DC | PRN

## 2022-10-11 MED ORDER — SERTRALINE HCL 50 MG PO TABS
150.0000 mg | ORAL_TABLET | Freq: Every day | ORAL | Status: DC
  Administered 2022-10-12 – 2022-10-14 (×3): 150 mg via ORAL
  Filled 2022-10-11 (×3): qty 3

## 2022-10-11 MED ORDER — MIDODRINE HCL 5 MG PO TABS
10.0000 mg | ORAL_TABLET | Freq: Three times a day (TID) | ORAL | Status: AC
  Administered 2022-10-11: 10 mg via ORAL
  Filled 2022-10-11: qty 2

## 2022-10-11 MED ORDER — FLUTICASONE FUROATE-VILANTEROL 100-25 MCG/ACT IN AEPB
1.0000 | INHALATION_SPRAY | Freq: Every day | RESPIRATORY_TRACT | Status: DC
Start: 2022-10-11 — End: 2022-10-11

## 2022-10-11 MED ORDER — VANCOMYCIN HCL 1250 MG/250ML IV SOLN
1250.0000 mg | Freq: Once | INTRAVENOUS | Status: AC
  Administered 2022-10-11: 1250 mg via INTRAVENOUS
  Filled 2022-10-11: qty 250

## 2022-10-11 MED ORDER — INSULIN ASPART 100 UNIT/ML IJ SOLN: 0.0000 [IU] | Freq: Every day | INTRAMUSCULAR | Status: DC

## 2022-10-11 MED ORDER — LACTATED RINGERS IV BOLUS
500.0000 mL | Freq: Once | INTRAVENOUS | Status: AC
  Administered 2022-10-11: 500 mL via INTRAVENOUS

## 2022-10-11 MED ORDER — IPRATROPIUM-ALBUTEROL 0.5-2.5 (3) MG/3ML IN SOLN: 3.0000 mL | Freq: Once | RESPIRATORY_TRACT | Status: AC

## 2022-10-11 MED ORDER — POTASSIUM CHLORIDE CRYS ER 20 MEQ PO TBCR
40.0000 meq | EXTENDED_RELEASE_TABLET | Freq: Once | ORAL | Status: AC
  Administered 2022-10-11: 40 meq via ORAL
  Filled 2022-10-11: qty 2

## 2022-10-11 MED ORDER — SENNOSIDES-DOCUSATE SODIUM 8.6-50 MG PO TABS: 1.0000 | ORAL_TABLET | Freq: Every evening | ORAL | Status: DC | PRN

## 2022-10-11 MED ORDER — LIDOCAINE 5 % EX PTCH: 1.0000 | MEDICATED_PATCH | Freq: Every day | CUTANEOUS | Status: DC | PRN

## 2022-10-11 MED ORDER — METRONIDAZOLE 500 MG/100ML IV SOLN
500.0000 mg | Freq: Two times a day (BID) | INTRAVENOUS | Status: DC
  Administered 2022-10-11 – 2022-10-12 (×2): 500 mg via INTRAVENOUS
  Filled 2022-10-11 (×2): qty 100

## 2022-10-11 MED ORDER — HYDRALAZINE HCL 20 MG/ML IJ SOLN: 5.0000 mg | Freq: Three times a day (TID) | INTRAMUSCULAR | Status: DC | PRN

## 2022-10-11 MED ORDER — ONDANSETRON HCL 4 MG/2ML IJ SOLN
4.0000 mg | Freq: Four times a day (QID) | INTRAMUSCULAR | Status: DC | PRN
  Administered 2022-10-12: 4 mg via INTRAVENOUS
  Filled 2022-10-11: qty 2

## 2022-10-11 MED ORDER — HEPARIN SODIUM (PORCINE) 5000 UNIT/ML IJ SOLN
5000.0000 [IU] | Freq: Three times a day (TID) | INTRAMUSCULAR | Status: DC
  Administered 2022-10-11 – 2022-10-13 (×6): 5000 [IU] via SUBCUTANEOUS
  Filled 2022-10-11 (×6): qty 1

## 2022-10-11 MED ORDER — IPRATROPIUM-ALBUTEROL 0.5-2.5 (3) MG/3ML IN SOLN
3.0000 mL | Freq: Three times a day (TID) | RESPIRATORY_TRACT | Status: AC
  Administered 2022-10-11 – 2022-10-12 (×2): 3 mL via RESPIRATORY_TRACT
  Filled 2022-10-11 (×2): qty 3

## 2022-10-11 NOTE — Progress Notes (Signed)
CODE SEPSIS - PHARMACY COMMUNICATION  **Broad Spectrum Antibiotics should be administered within 1 hour of Sepsis diagnosis**  Time Code Sepsis Called/Page Received: 9/28 1543  Antibiotics Ordered: cefepime, metronidazole, vancomycin  Time of 1st antibiotic administration: 1631  Additional action taken by pharmacy: Reached out to nurse to get a weight/height  If necessary, Name of Provider/Nurse Contacted: Tacy Dura, RN    Merryl Hacker ,PharmD Clinical Pharmacist  10/11/2022  3:58 PM

## 2022-10-11 NOTE — ED Notes (Signed)
Pt. Taken off bedpan, urine specimen sent to lab. Pericare provided x1 assist. Pt. Repositioned for comfort. Pt. Denies need at this time.

## 2022-10-11 NOTE — H&P (Addendum)
History and Physical   Beth Stanley ZOX:[id number] DOB: [birth year] DOA: 10/11/2022  PCP: Erskine Emery, NP  Outpatient Specialists: Dr. Rulon Eisenmenger, [facility] hematology-oncology Patient coming from: Home via EMS  I have personally briefly reviewed patient's old medical records in [facility] EMR.  Chief Concern: Fall and weakness  HPI: Beth Stanley is a 72 year old female with history of tobacco use, COPD, depression, anxiety, GERD, non-insulin-dependent diabetes mellitus, hypertension, diffuse large B-cell lymphoma stage IV status post autologous stem cell transplant in 2018 and wbc + platelet engraftment in October 2018, chemotherapy and radiation at [facility], who presents ED for chief concerns of hypotension and weakness.  Vitals in the ED showed temperature of 97.9, respiration rate 18, heart rate of 102, blood pressure 88/58, improved to 102/86, SpO2 93% on room air.  Serum sodium is 132, potassium 3.2, chloride 98, bicarb 19, BUN of 56, serum creatinine 1.83, EGFR 29, nonfasting blood glucose 181, WBC 13.2, hemoglobin 14.6, platelets of 210.  Magnesium is 2.5.  Lactic acid was elevated at 2.1.  COVID/influenza A/influenza B/RSV PCR were negative.  CT CAP wo contrast: Was read as no acute findings in the chest, abdomen, pelvis.  Aortic atherosclerosis and emphysema.  ED treatment: DuoNebs one-time dose, nicotine patch, potassium chloride 40 mill equivalent p.o. one-time dose, sodium chloride 2 L bolus. ------------------------------------------ At bedside, patient patient is able to tell me her name, age, location, current calendar year.  She reports that she fell hitting her right side flank.  She denies head trauma and loss of consciousness.  She states it has been hurting there since.  There is no bruising on physical exam.  She endorses shortness of breath, that is currently improved after treatments in the emergency department.  She denies chest  pain, swelling of her lower extremity, diarrhea.  She endorses dysuria and increased suprapubic pressure that started on Tuesday.  She denies hematuria, cough, chills, fever.  Social history: She lives at home with her husband.  She endorses daily tobacco use, smoking half a pack per day.  She denies EtOH and recreational drug use.  She is retired.  ROS: Constitutional: no weight change, no fever ENT/Mouth: no sore throat, no rhinorrhea Eyes: no eye pain, no vision changes Cardiovascular: no chest pain, no dyspnea,  no edema, no palpitations Respiratory: no cough, no sputum, no wheezing Gastrointestinal: + nausea, + vomiting, no diarrhea, no constipation Genitourinary: no urinary incontinence, + dysuria, no hematuria Musculoskeletal: no arthralgias, no myalgias Skin: no skin lesions, no pruritus, Neuro: + weakness, no loss of consciousness, no syncope Psych: no anxiety, no depression, + decrease appetite Heme/Lymph: no bruising, no bleeding  ED Course: Discussed with EDP, patient requiring hospitalization for chief concerns of meeting sepsis criteria.  Assessment/Plan  Principal Problem:   Severe sepsis with acute organ dysfunction (HCC) Active Problems:   Centrilobular emphysema (HCC)   AKI (acute kidney injury) (HCC)   Hypotension   UTI (urinary tract infection)   Hypokalemia   Tobacco dependence   Muscular abdominal pain in right flank   Chronic prescription opiate use - subxone Rx'd by Courtney Paris, NP at [facility]   GERD (gastroesophageal reflux disease)   Hyperlipidemia   Hypertension   Diffuse large B-cell lymphoma of lymph nodes of head (HCC)   History of autologous stem cell transplant (HCC)   Elevated SGOT (AST)   Heart failure with preserved ejection fraction (HCC)   Diabetes mellitus type 2, noninsulin dependent (HCC)  Assessment and Plan:  * Severe sepsis with acute organ dysfunction [facility]) Patient has elevated lactic acid at 2.1, leukocytosis of  13.2, increased heart rate, initial septic shock, responded to IV fluid bolus Source workup in progress Blood cultures x 2 ordered UA ordered, is in process Check procalcitonin on admission Treat with antibiotic for unknown source of infection  UTI (urinary tract infection) Present on admission, patient was diagnosed with UTI outpatient on Tuesday and was prescribed Keflex Patient reports that she was compliant with Keflex however a couple of days later, she developed nausea and every time she tried to take the Keflex she would start to vomit Continue treatment with broad-spectrum antibiotic for sepsis criteria given elevated lactic acid, leukocytosis, hypotension, bacteremia cannot be excluded at this time Cefepime, vancomycin per pharmacy  Hypotension Query septic shock Status post sodium chloride 2 L bolus per EDP Ordered additional LR 500 ml bolus on admission Continue with LR infusion at 150 mL/h, 20 hours ordered Midodrine 10 mg p.o. one-time dose ordered on admission Admit to inpatient, stepdown  Addendum: Received message from RN at approximately 17:07 that patient's blood pressure decreased to 82/48 (MAP of 59). I evaluated patient at bedside, she is sitting up eating dinner and states she is very hungry.  She says she feels much better now that she has got some food inside of her. She asked me if she can have a [employer] sandwich if her husband brings her some food. I recycled patient's blood pressure and it showed a MAP of 59 and then it improved to 104/61 (MAP 75). Discussed with RN, continue current treatment course  AKI (acute kidney injury) (HCC) Patient serum creatinine range over the last 7 months has been at 0.80-0.92, eGFR greater than 60 Suspect intrarenal secondary to severe sepsis Patient is status post sodium chloride 2 L bolus per EDP We will continue with sodium chloride infusion at 150 mL/h, 20 hours ordered  Centrilobular emphysema (HCC) DuoNebs, 3 times  daily, 2 doses ordered on admission  Hypokalemia Suspect secondary to acute kidney injury Status post potassium chloride 40 mill equivalent p.o. one-time dose per EP LR 500 mL bolus ordered on admission LR infusion at 150 mL/h, 20 hours ordered Recheck BMP in the a.m.  Muscular abdominal pain in right flank Suspect secondary to trauma, pain from her fall at home on Wednesday As needed lidocaine patch ordered  Tobacco dependence As needed nicotine patch/gum for nicotine craving ordered Patient endorses readiness to stop tobacco use. Tobacco cessation counseling (> 3 minutes spent): Cigarettes are very expensive, you can find better ways to spend your heart or money Weeks one and two, smoke 9 cigarettes per day. Weeks three and four, smoke 8 cigarettes per day. Weeks five and six, smoke 7 cigarettes per day, continue until smoking half the amount of cigarettes per day Discuss with PCP for pharmacologic assistance with smoking cessation Avoid prolong interactions with individuals actively smoking cigarettes If you smoke in social setting, avoid social setting where cigarette smoking is expected or considered acceptable   Chronic prescription opiate use - subxone Rx'd by Courtney Paris, NP at [facility] Suboxone 8-2 sublingual, patient takes this daily instead of the prescribed 3 times daily, and she took this prior to ED presentation Suboxone 8-2 sublingual resumed for 9/29  Diabetes mellitus type 2, noninsulin dependent (HCC) Home Jardiance not resumed on admission Insulin SSI with at bedtime coverage ordered, renal dosing Goal inpatient blood glucose levels 140-180  Heart failure with preserved  ejection fraction (HCC) Echo on 09/30/19 with EF 55-60%, G1DD Does not appear to be in acute exacerbation at this time  Elevated SGOT (AST) Mild, suspect secondary to severe sepsis  Hypertension Home antihypertensive medication not resumed on admission due to hypotension/low  normal tensive and acute kidney injury AM team to resume home ramipril 2.5 mg daily when the benefits outweigh the risk Hydralazine 5 mg IV every 8 hours as needed for SBP greater 175, 5 days ordered  Chart reviewed.   DVT prophylaxis: Heparin 5000 units subcutaneous every 8 hours Code Status: Full code Diet: Heart healthy/carb modified Family Communication: A phone call was offered, patient states that everybody has been updated and knows that she is being admitted to the hospital already Disposition Plan: Pending clinical course Consults called: Pharmacy Admission status: Inpatient, stepdown  Past Medical History:  Diagnosis Date   Acute metabolic encephalopathy 02/20/2022   Anxiety    Arthritis    C. difficile colitis 10/01/2019   COVID-19 virus infection 09/29/2019   Diabetes (HCC)    Heartburn    History of stem cell transplant (HCC)    HTN (hypertension)    Multifocal pneumonia 04/19/2021   Non Hodgkin's lymphoma (HCC) 2004   Hx chemo tx's.    Past Surgical History:  Procedure Laterality Date   TOTAL ABDOMINAL HYSTERECTOMY     Social History:  reports that she has been smoking cigarettes. She has never used smokeless tobacco. She reports that she does not currently use alcohol. She reports that she does not use drugs.  Allergies  Allergen Reactions   Aspirin     Stomach cramp  Other Reaction(s): abdominal pain, Not available   Latex Swelling    Other Reaction(s): Angioedema, Not available   Other    Tape Rash    Makes Whelps on Skin Makes Whelps on Skin   Wound Dressing Adhesive Rash    Makes Whelps on Skin, Prefers paper tape   Family History  Problem Relation Age of Onset   Diabetes Mother    Hypertension Father    Lung cancer Sister    Hypertension Brother    Lung cancer Brother    Kidney cancer Brother        removed kidney    Hypertension Brother    Lung cancer Brother    Prostate cancer Neg Hx    Bladder Cancer Neg Hx    Family history:  Family history reviewed and not pertinent.  Prior to Admission medications   Medication Sig Start Date End Date Taking? Authorizing Provider  acetaminophen (ACETAMINOPHEN 8 HOUR) 650 MG CR tablet Take 1,300 mg by mouth 2 (two) times daily.    [provider]  albuterol (VENTOLIN HFA) 108 (90 Base) MCG/ACT inhaler Inhale 1 puff into the lungs every 6 (six) hours as needed for wheezing. 02/21/22   Marrion Coy, MD  b complex vitamins capsule Take 1 capsule by mouth daily.    [provider]  Buprenorphine HCl-Naloxone HCl 8-2 MG FILM Place 1 strip under the tongue in the morning, at noon, and at bedtime. 11/04/20   [provider]  Cholecalciferol 1.25 MG (50000 UT) capsule Take 1 tablet by mouth once a week.    [provider]  diphenoxylate-atropine (LOMOTIL) 2.5-0.025 MG tablet Take 1 tablet by mouth 4 (four) times daily.    [provider]  docusate sodium (COLACE) 100 MG capsule Take 100 mg by mouth daily as needed for mild constipation.    [provider]  estradiol (ESTRACE) 0.1 MG/GM vaginal cream Place 1 Applicatorful vaginally daily.    [provider]  ferrous sulfate 325 (65 FE) MG tablet Take 325 mg by mouth 3 (three) times a week. Monday. Weds, fri 02/11/21   [provider]  JARDIANCE 25 MG TABS tablet Take 25 mg by mouth daily. 08/17/20   [provider]  magnesium oxide (MAG-OX) 400 (240 Mg) MG tablet Take 1 tablet by mouth daily. 10/01/20   [provider]  methocarbamol (ROBAXIN) 500 MG tablet Take 500 mg by mouth 2 (two) times daily.    [provider]  omeprazole (PRILOSEC) 20 MG capsule Take 20 mg by mouth daily. 08/25/20   [provider]  potassium chloride SA (KLOR-CON) 20 MEQ tablet Take 20 mEq by mouth daily. 07/28/19   [provider]  ramipril (ALTACE) 2.5 MG capsule Take 1 capsule (2.5 mg total) by mouth daily. 08/08/17   Ofilia Neas, PA-C  sertraline  (ZOLOFT) 100 MG tablet TAKE ONE (1) TABLET EACH DAY Patient taking differently: Take 150 mg by mouth daily. 06/10/17   Ofilia Neas, PA-C  TRELEGY ELLIPTA 100-62.5-25 MCG/ACT AEPB Inhale 1 puff into the lungs daily. 04/04/21   [provider]  valACYclovir (VALTREX) 1000 MG tablet Take 1,000 mg by mouth daily. 08/22/20   [provider]   Physical Exam: Vitals:   10/11/22 1700 10/11/22 1701 10/11/22 1715 10/11/22 1723  BP: (!) 95/53 (!) 82/48 (!) 104/59   Pulse:   (!) 104   Resp: 15 15 18    Temp:    97.8 F (36.6 C)  TempSrc:    Oral  SpO2:   100%   Weight:      Height:       Constitutional: appears age-appropriate, frail, cutely ill Eyes: PERRL, lids and conjunctivae normal ENMT: Mucous membranes are moist. Posterior pharynx clear of any exudate or lesions. Age-appropriate dentition. Hearing appropriate Neck: normal, supple, no masses, no thyromegaly Respiratory: clear to auscultation bilaterally, + end expiratory wheezing, no crackles. Normal respiratory effort. No accessory muscle use.  Cardiovascular: Regular rate and rhythm, no murmurs / rubs / gallops. No extremity edema. 2+ pedal pulses. No carotid bruits.  Abdomen: Obese abdomen, + suprapubic tenderness, no masses palpated, no hepatosplenomegaly. Bowel sounds positive.  Musculoskeletal: no clubbing / cyanosis. No joint deformity upper and lower extremities. Good ROM, no contractures, no atrophy. Normal muscle tone.  Skin: no rashes, lesions, ulcers. No induration Neurologic: Sensation intact. Strength 5/5 in all 4.  Psychiatric: Normal judgment and insight. Alert and oriented x 3. Normal mood.   EKG: independently reviewed, showing sinus rhythm with rate of 88, QTc 526  Chest x-ray on Admission: Not indicated at this time  CT CHEST ABDOMEN PELVIS WO CONTRAST  Result Date: 10/11/2022 CLINICAL DATA:  Weakness and fall with pain of the chest and abdomen. EXAM: CT CHEST, ABDOMEN AND PELVIS WITHOUT CONTRAST  TECHNIQUE: Multidetector CT imaging of the chest, abdomen and pelvis was performed following the standard protocol without IV contrast. RADIATION DOSE REDUCTION: This exam was performed according to the departmental dose-optimization program which includes automated exposure control, adjustment of the mA and/or kV according to patient size and/or use of iterative reconstruction technique. COMPARISON:  CT chest dated 04/19/2021 and CT chest, abdomen, and pelvis dated 11/15/2020. FINDINGS: CT CHEST FINDINGS Cardiovascular: A right internal jugular approach port catheter tip terminates at the superior cavoatrial junction. Vascular calcifications are seen in the thoracic aorta. Normal heart size. No pericardial effusion.  Mediastinum/Nodes: No enlarged mediastinal, hilar, or axillary lymph nodes. Thyroid gland, trachea, and esophagus demonstrate no significant findings. Lungs/Pleura: Biapical pleural-parenchymal scarring and calcification appears unchanged. Centrilobular and paraseptal emphysema is redemonstrated. No pleural effusion or pneumothorax. Musculoskeletal: Degenerative changes are seen in the spine. CT ABDOMEN PELVIS FINDINGS Hepatobiliary: No focal liver abnormality is seen. No gallstones, gallbladder wall thickening, or biliary dilatation. Pancreas: Unremarkable. No pancreatic ductal dilatation or surrounding inflammatory changes. Spleen: Normal in size without focal abnormality. Adrenals/Urinary Tract: Adrenal glands are unremarkable. Kidneys are normal, without renal calculi, focal lesion, or hydronephrosis. Bladder is unremarkable. Stomach/Bowel: Stomach is within normal limits. Appendix appears normal. No evidence of bowel wall thickening, distention, or inflammatory changes. Vascular/Lymphatic: Aortic atherosclerosis. No enlarged abdominal or pelvic lymph nodes. Reproductive: Status post hysterectomy. No adnexal masses. Other: No abdominal wall hernia or abnormality. No abdominopelvic ascites.  Musculoskeletal: There is 3 mm anterolisthesis of L4 on L5 is unchanged. No acute fracture is identified. IMPRESSION: No acute findings in the chest, abdomen, or pelvis. Aortic Atherosclerosis (ICD10-I70.0) and Emphysema (ICD10-J43.9). Electronically Signed   By: Romona Curls M.D.   On: 10/11/2022 15:11    Labs on Admission: I have personally reviewed following labs  CBC: Recent Labs  Lab 10/11/22 1248  WBC 13.2*  HGB 14.6  HCT 45.0  MCV 91.8  PLT 210   Basic Metabolic Panel: Recent Labs  Lab 10/11/22 1248 10/11/22 1322  NA 132*  --   K 3.2*  --   CL 98  --   CO2 19*  --   GLUCOSE 181*  --   BUN 56*  --   CREATININE 1.83*  --   CALCIUM 9.3  --   MG  --  2.5*   GFR: Estimated Creatinine Clearance: 24.1 mL/min (A) (by C-G formula based on SCr of 1.83 mg/dL (H)).  Liver Function Tests: Recent Labs  Lab 10/11/22 1322  AST 45*  ALT 25  ALKPHOS 65  BILITOT 0.7  PROT 7.8  ALBUMIN 4.3   Recent Labs  Lab 10/11/22 1322  LIPASE 44   Urine analysis:    Component Value Date/Time   COLORURINE YELLOW (A) 10/11/2022 1322   APPEARANCEUR HAZY (A) 10/11/2022 1322   APPEARANCEUR Clear 06/05/2016 1003   LABSPEC 1.012 10/11/2022 1322   PHURINE 5.0 10/11/2022 1322   GLUCOSEU 150 (A) 10/11/2022 1322   HGBUR SMALL (A) 10/11/2022 1322   BILIRUBINUR NEGATIVE 10/11/2022 1322   BILIRUBINUR negative 02/16/2017 1628   BILIRUBINUR Negative 06/05/2016 1003   KETONESUR NEGATIVE 10/11/2022 1322   PROTEINUR NEGATIVE 10/11/2022 1322   UROBILINOGEN 0.2 02/16/2017 1628   UROBILINOGEN 0.2 01/24/2010 1052   NITRITE NEGATIVE 10/11/2022 1322   LEUKOCYTESUR MODERATE (A) 10/11/2022 1322   CRITICAL CARE Performed by: Dr. Sedalia Muta  Total critical care time: 32 minutes  Critical care time was exclusive of separately billable procedures and treating other patients.  Critical care was necessary to treat or prevent imminent or life-threatening deterioration.  Critical care was time spent  personally by me on the following activities: development of treatment plan with patient and/or surrogate as well as nursing, discussions with consultants, evaluation of patient's response to treatment, examination of patient, obtaining history from patient or surrogate, ordering and performing treatments and interventions, ordering and review of laboratory studies, ordering and review of radiographic studies, pulse oximetry and re-evaluation of patient's condition.  This document was prepared using Dragon Voice Recognition software and may include unintentional dictation errors.  Dr. Sedalia Muta [facility]  If 7PM-7AM,  please contact overnight-coverage provider If 7AM-7PM, please contact day attending provider [email or web address]  10/11/2022, 5:27 PM

## 2022-10-11 NOTE — ED Triage Notes (Signed)
Pt states BP has been low for the last month but does not know how low

## 2022-10-11 NOTE — Progress Notes (Signed)
Pharmacy Antibiotic Note  Beth Stanley is a 72 y.o. female admitted on 10/11/2022 with sepsis 2/2 infection of unknown source.  Pharmacy has been consulted for vancomycin and cefepime dosing.  Plan: Day 1 of antibiotics Give vancomycin 1250 mg IV x1. Check random vanc level tomorrow given AKI (SCr 1.83, baseline is unclear, it was 0.8-0.9 in 02/2022) Start cefepime 2 g IV Q24H Patient is also on metronidazole 500 mg IV Q12H Continue to monitor renal function and follow culture results      Temp (24hrs), Avg:97.9 F (36.6 C), Min:97.9 F (36.6 C), Max:97.9 F (36.6 C)  Recent Labs  Lab 10/11/22 1248 10/11/22 1323  WBC 13.2*  --   CREATININE 1.83*  --   LATICACIDVEN  --  2.1*    CrCl cannot be calculated (Unknown ideal weight.).    Allergies  Allergen Reactions   Aspirin     Stomach cramp  Other Reaction(s): abdominal pain, Not available   Latex Swelling    Other Reaction(s): Angioedema, Not available   Other    Tape Rash    Makes Whelps on Skin Makes Whelps on Skin   Wound Dressing Adhesive Rash    Makes Whelps on Skin, Prefers paper tape    Antimicrobials this admission: 9/28 Vanc >> x 7 days 9/28 Cefepime >> x 7 days 9/28 Metronidazole>> x 7 days  Dose adjustments this admission: None  Microbiology results: 9/28 BC x 2 ordered   Thank you for allowing pharmacy to be a part of this patient's care.  Merryl Hacker, PharmD Clinical Pharmacist 10/11/2022 3:59 PM

## 2022-10-11 NOTE — Assessment & Plan Note (Addendum)
Home antihypertensive medication not resumed on admission due to hypotension/low normal tensive and acute kidney injury AM team to resume home ramipril 2.5 mg daily when the benefits outweigh the risk Hydralazine 5 mg IV every 8 hours as needed for SBP greater 175, 5 days ordered

## 2022-10-11 NOTE — Progress Notes (Signed)
[facility] Progress Note  Received message from nursing that patient's blood pressure dropped to 77/33, MAP of 44 mmHg.   # Severe sepsis with septic shock and end-organ involvement Not responding as appropriate to aggressive IV fluid resuscitation Patient is status post sodium chloride 2 L bolus and LR 500 mL bolus Lactated ringer at 150 mL/h ordered Status post midodrine 10 mg one-time dose, per MAR given at 1631 Discussed with nursing to increase patient's lactated ringer to 200 mL/h for 4 hours, orders in epic changed PCCM has been consulted for vasopressor support and admission to ICU via secure chat  Update: Discussed with PCCM, patient started on Levophed gtt. and has been accepted to ICU level care  Dr. Sedalia Muta

## 2022-10-11 NOTE — Assessment & Plan Note (Signed)
Home Suboxone 8-2 sublingual, patient takes this daily instead of the prescribed 3 times daily, and she took this prior to ED presentation Suboxone 8-2 sublingual resumed for 9/29

## 2022-10-11 NOTE — Assessment & Plan Note (Signed)
Suspect secondary to acute kidney injury Status post potassium chloride 40 mill equivalent p.o. one-time dose per EP LR 500 mL bolus ordered on admission LR infusion at 150 mL/h, 20 hours ordered Recheck BMP in the a.m.

## 2022-10-11 NOTE — ED Notes (Signed)
ED Provider at bedside. 

## 2022-10-11 NOTE — Assessment & Plan Note (Signed)
DuoNebs, 3 times daily, 2 doses ordered on admission

## 2022-10-11 NOTE — ED Notes (Signed)
This RN to bedside, to answer call light. Pt. States she needs to urinate. Pt. Placed on bedpan. Pt. Noted to be wheezing with exertion, states she sometimes needs albuterol inhaler when she starts to wheeze. Dr. Arnoldo Morale notified. See orders. Pt. Verbalizes understanding to hit call light when she is done urinating.

## 2022-10-11 NOTE — Assessment & Plan Note (Addendum)
Patient serum creatinine range over the last 7 months has been at 0.80-0.92, eGFR greater than 60 Suspect intrarenal secondary to severe sepsis Patient is status post sodium chloride 2 L bolus per EDP We will continue with sodium chloride infusion at 150 mL/h, 20 hours ordered

## 2022-10-11 NOTE — Assessment & Plan Note (Signed)
Home Jardiance not resumed on admission Insulin SSI with at bedtime coverage ordered, renal dosing Goal inpatient blood glucose levels 140-180

## 2022-10-11 NOTE — ED Triage Notes (Signed)
Pt here via [facility] with a fall and weakness. Pt received a flu shot and was hypotensive on Tuesday. Pt then had a fall on Wed after tripping, pain on the right flank area. Pt having pains while breathing, pt states her weakness has gotten worse since the fall. Pt denies head pain and is unsure if she hit her head, pt is not on blood thinners. Pt told to come to the ED for evaluation. Pt has hx of DM.  90/38 94 95% RA 215-cbg

## 2022-10-11 NOTE — ED Notes (Signed)
Pt. Repositioned in bed for comfort and arranged to eat dinner. Pt. Is alert and oriented , pleasant and conversational with staff. Pt's tray set up and available to pt. Pt. Eating.

## 2022-10-11 NOTE — Consult Note (Signed)
NAME:  Beth Stanley, MRN:  [id number], DOB:  [birth year], LOS: 0 ADMISSION DATE:  10/11/2022, CONSULTATION DATE: 10/11/2022 REFERRING MD: Dr. Sedalia Muta, CHIEF COMPLAINT: Fall and Weakness    History of Present Illness:  This is a 72 yo female who presented to [facility] ER via EMS with c/o weakness and right-sided pain due to a fall.  She had a fall on 09/25, however she denies hitting her head but fell on her right side.  She also has had issues with hypotension and her PCP instructed her to stop taking her antihypertensive medications.  She was also diagnosed with a UTI on 09/24 for which she was taking keflex, however she has had issues with dry heaving since taking the medication.  However, she is uncertain if the nausea is related to the keflex.   ED Course Upon arrival to the ER pt had soft bp readings and received 2L of iv fluid resuscitation.  Following iv fluid resuscitation pts bp initially improved.   Significant lab results were: Na+ 132/K+ 3.2/CO2 19/glucose 181/BUN 56/creatinine 1.83/wbc 13.2/UA positive for UTI.  Pt received cefepime and vancomycin.  Pt later developed wheezing with exertion and received duoneb x1 with improvement.  She was subsequently admitted to the progressive care unit per hospitalist team, but remained in the ER pending bed availability.  While in the ER pt developed hypotension requiring levophed gtt PCCM team consulted to assist with management.    Pertinent  Medical History  Anxiety  Arthritis  C. Diff Colitis  COVID-19 Type II Diabetes Mellitus  Non Hodgkin's Lymphoma (chemo treatments and stem cell transplant for treatment) HTN  Multifocal Pneumonia   Significant Hospital Events: Including procedures, antibiotic start and stop dates in addition to other pertinent events   09/28: Pt admitted with urosepsis requiring levophed gtt   Interim History / Subjective:  Pt resting comfortably in bed with c/o mild shortness of breath but states this has improved  following duoneb treatment   Objective   Blood pressure (!) 83/42, pulse 90, temperature 97.8 F (36.6 C), temperature source Oral, resp. rate 15, height 5\' 2"  (1.575 m), weight 62.2 kg, SpO2 97%.        Intake/Output Summary (Last 24 hours) at 10/11/2022 1741 Last data filed at 10/11/2022 1739 Gross per 24 hour  Intake 2606.36 ml  Output --  Net 2606.36 ml   Filed Weights   10/11/22 1614  Weight: 62.2 kg    Examination: General: Acutely-ill appearing female, NAD on RA  HENT: Supple, no JVD  Lungs: Faint rhonchi throughout, even, non labored  Cardiovascular: NSR, no m/r/g, 2+ radial/2+ distal pulses, no edema  Abdomen: +BS x4, obese, soft, non tender, non distended  Extremities: Normal bulk and tone, moves all extremities  Neuro: Alert and oriented, following commands, PERRLA  GU: Voiding   Resolved Hospital Problem list     Assessment & Plan:   #Depression #Anxiety #Chronic pain   - Continue outpatient suboxone and lidoderm patch  #Septic shock  Hx: HTN and CHF  - Continuous telemetry monitoring  - IV fluid resuscitation, scheduled midodrine, and prn levophed gtt to maintain map 65 or higher  - Hold outpatient antihypertensives for now  - Will check TSH, free T3, and cortisol level   #COPD #Tobacco abuse  - Continue scheduled bronchodilator therapy  - CXR pending  - Continue smoking cessation counseling   #Acute kidney injury secondary to ATN #Mild lactic acidosis  - Trend BMP and lactic acid  - Replace electrolytes  as indicated  - Monitor UOP  - Avoid nephrotoxic medications    #UTI  - Trend WBC and monitor fever curve  - Trend PCT  - Continue cefepime and vancomycin for now pending culture results and sensitivities   #Stage IV diffuse large B-cell lymphoma  - Follow-up with outpatient oncology at [facility]   #GERD  - Continue protonix   #Type II diabetes mellitus  - CBG's ac/hs  - SSI   Best Practice (right click and "Reselect all  SmartList Selections" daily)   Diet/type: Regular consistency (see orders) DVT prophylaxis: prophylactic heparin  GI prophylaxis: PPI Lines: N/A Foley:  N/A Code Status:  full code Last date of multidisciplinary goals of care discussion [N/A]  Labs   CBC: Recent Labs  Lab 10/11/22 1248  WBC 13.2*  HGB 14.6  HCT 45.0  MCV 91.8  PLT 210    Basic Metabolic Panel: Recent Labs  Lab 10/11/22 1248 10/11/22 1322  NA 132*  --   K 3.2*  --   CL 98  --   CO2 19*  --   GLUCOSE 181*  --   BUN 56*  --   CREATININE 1.83*  --   CALCIUM 9.3  --   MG  --  2.5*   GFR: Estimated Creatinine Clearance: 24.1 mL/min (A) (by C-G formula based on SCr of 1.83 mg/dL (H)). Recent Labs  Lab 10/11/22 1248 10/11/22 1323 10/11/22 1618  WBC 13.2*  --   --   LATICACIDVEN  --  2.1* 2.1*    Liver Function Tests: Recent Labs  Lab 10/11/22 1322  AST 45*  ALT 25  ALKPHOS 65  BILITOT 0.7  PROT 7.8  ALBUMIN 4.3   Recent Labs  Lab 10/11/22 1322  LIPASE 44   No results for input(s): "AMMONIA" in the last 168 hours.  ABG    Component Value Date/Time   HCO3 27.9 02/20/2022 2141   ACIDBASEDEF 0.8 09/29/2019 2053   O2SAT 30.8 02/20/2022 2141     Coagulation Profile: No results for input(s): "INR", "PROTIME" in the last 168 hours.  Cardiac Enzymes: No results for input(s): "CKTOTAL", "CKMB", "CKMBINDEX", "TROPONINI" in the last 168 hours.  HbA1C: Hgb A1c MFr Bld  Date/Time Value Ref Range Status  03/05/2022 05:16 AM 6.1 (H) 4.8 - 5.6 % Final    Comment:    (NOTE) Pre diabetes:          5.7%-6.4%  Diabetes:              >6.4%  Glycemic control for   <7.0% adults with diabetes   04/23/2021 05:50 AM 6.5 (H) 4.8 - 5.6 % Final    Comment:    (NOTE) Pre diabetes:          5.7%-6.4%  Diabetes:              >6.4%  Glycemic control for   <7.0% adults with diabetes     CBG: Recent Labs  Lab 10/11/22 1648  GLUCAP 89    Review of Systems: Positives in BOLD   Gen:  fall, fever, chills, weight change, fatigue, night sweats HEENT: Denies blurred vision, double vision, hearing loss, tinnitus, sinus congestion, rhinorrhea, sore throat, neck stiffness, dysphagia PULM: shortness of breath, cough, sputum production, hemoptysis, wheezing CV: Denies chest pain, edema, orthopnea, paroxysmal nocturnal dyspnea, palpitations GI: right-sided abdominal pain, nausea, vomiting, diarrhea, hematochezia, melena, constipation, change in bowel habits GU: dysuria, hematuria, polyuria, oliguria, urethral discharge Endocrine: Denies hot or cold intolerance,  polyuria, polyphagia or appetite change Derm: Denies rash, dry skin, scaling or peeling skin change Heme: Denies easy bruising, bleeding, bleeding gums Neuro: headache, numbness, weakness, slurred speech, loss of memory or consciousness  Past Medical History:  She,  has a past medical history of Acute metabolic encephalopathy (02/20/2022), Anxiety, Arthritis, C. difficile colitis (10/01/2019), COVID-19 virus infection (09/29/2019), Diabetes (HCC), Heartburn, History of stem cell transplant (HCC), HTN (hypertension), Multifocal pneumonia (04/19/2021), and Non Hodgkin's lymphoma (HCC) (2004).   Surgical History:   Past Surgical History:  Procedure Laterality Date   TOTAL ABDOMINAL HYSTERECTOMY       Social History:   reports that she has been smoking cigarettes. She has never used smokeless tobacco. She reports that she does not currently use alcohol. She reports that she does not use drugs.   Family History:  Her family history includes Diabetes in her mother; Hypertension in her brother, brother, and father; Kidney cancer in her brother; Lung cancer in her brother, brother, and sister. There is no history of Prostate cancer or Bladder Cancer.   Allergies Allergies  Allergen Reactions   Aspirin     Stomach cramp  Other Reaction(s): abdominal pain, Not available   Latex Swelling    Other Reaction(s): Angioedema, Not  available   Other    Tape Rash    Makes Whelps on Skin Makes Whelps on Skin   Wound Dressing Adhesive Rash    Makes Whelps on Skin, Prefers paper tape     Home Medications  Prior to Admission medications   Medication Sig Start Date End Date Taking? Authorizing Provider  acetaminophen (TYLENOL) 500 MG tablet Take 1,000 mg by mouth 2 (two) times daily as needed for mild pain.   Yes [provider]  albuterol (VENTOLIN HFA) 108 (90 Base) MCG/ACT inhaler Inhale 1 puff into the lungs every 6 (six) hours as needed for wheezing. 02/21/22  Yes Marrion Coy, MD  Buprenorphine HCl-Naloxone HCl 8-2 MG FILM Place 1 strip under the tongue in the morning, at noon, and at bedtime. 11/04/20  Yes [provider]  carboxymethylcellulose (REFRESH PLUS) 0.5 % SOLN Place 1 drop into the left eye 2 (two) times daily. 10/19/20  Yes [provider]  cephALEXin (KEFLEX) 500 MG capsule Take 500 mg by mouth 3 (three) times daily.   Yes [provider]  Cholecalciferol 1.25 MG (50000 UT) capsule Take 1 tablet by mouth once a week.   Yes [provider]  cyanocobalamin (VITAMIN B12) 1000 MCG tablet Take 1,000 mcg by mouth daily.   Yes [provider]  diphenoxylate-atropine (LOMOTIL) 2.5-0.025 MG tablet Take 1 tablet by mouth 4 (four) times daily.   Yes [provider]  docusate sodium (COLACE) 100 MG capsule Take 100 mg by mouth daily as needed for mild constipation.   Yes [provider]  lidocaine-prilocaine (EMLA) cream Apply 1 Application topically as needed (for port access). 07/22/18  Yes [provider]  magnesium oxide (MAG-OX) 400 (240 Mg) MG tablet Take 1 tablet by mouth daily. 10/01/20  Yes [provider]  meloxicam (MOBIC) 7.5 MG tablet Take 7.5 mg by mouth 2 (two) times daily.   Yes [provider]  omeprazole (PRILOSEC) 20 MG capsule Take 20 mg by mouth daily. 08/25/20  Yes [provider]  ondansetron  (ZOFRAN) 4 MG tablet Take 4 mg by mouth every 6 (six) hours as needed for nausea. 05/05/19  Yes [provider]  potassium chloride SA (KLOR-CON) 20 MEQ tablet Take  20 mEq by mouth daily. 07/28/19  Yes [provider]  ramipril (ALTACE) 2.5 MG capsule Take 1 capsule (2.5 mg total) by mouth daily. 08/08/17  Yes Ofilia Neas, PA-C  sertraline (ZOLOFT) 100 MG tablet TAKE ONE (1) TABLET EACH DAY Patient taking differently: Take 150 mg by mouth daily. 06/10/17  Yes Ofilia Neas, PA-C  valACYclovir (VALTREX) 1000 MG tablet Take 1,000 mg by mouth daily. 08/22/20  Yes [provider]  b complex vitamins capsule Take 1 capsule by mouth daily.    [provider]  estradiol (ESTRACE) 0.1 MG/GM vaginal cream Place 1 Applicatorful vaginally daily. Patient not taking: Reported on 10/11/2022    [provider]  ferrous sulfate 325 (65 FE) MG tablet Take 325 mg by mouth 3 (three) times a week. Monday. Weds, fri 02/11/21   [provider]  JARDIANCE 25 MG TABS tablet Take 25 mg by mouth daily. 08/17/20   [provider]  methocarbamol (ROBAXIN) 500 MG tablet Take 500 mg by mouth 2 (two) times daily. Patient not taking: Reported on 10/11/2022    [provider]  TRELEGY ELLIPTA 100-62.5-25 MCG/ACT AEPB Inhale 1 puff into the lungs daily. Patient not taking: Reported on 10/11/2022 04/04/21   [provider]     Critical care time: 50 minutes      Zada Girt, AGNP  Pulmonary/Critical Care Pager [phone] (please enter 7 digits) PCCM Consult Pager [phone] (please enter 7 digits)

## 2022-10-11 NOTE — Assessment & Plan Note (Signed)
Suspect secondary to trauma, pain from her fall at home on Wednesday As needed lidocaine patch ordered

## 2022-10-11 NOTE — ED Provider Notes (Signed)
[facility] Provider Note    Event Date/Time   First MD Initiated Contact with Patient 10/11/22 1300     (approximate)   History   Fall   HPI  Beth Stanley is a 72 y.o. female past medical history significant for tobacco use, hypertension, who presents to the emergency department with a fall and right sided pain.  Patient states that she has been having pain to her right side over the past couple of days.  States that she had a fall on Wednesday landing on her right side.  Ongoing pain since that time.  States that she has been having problems with low blood pressure, followed up with her primary care physician was told to not take any of her antihypertensive medications.  States that she has not been taking any of her antihypertensive medications.  Ongoing dizziness and lightheadedness.  Denies any fever, chills, cough, chest pain, nausea, vomiting or diarrhea.  Denies any blood in her stool.  Denies dysuria, urinary urgency or frequency.  Half of a pack of tobacco use on a daily basis.  Not on anticoagulation.     Physical Exam   Triage Vital Signs: ED Triage Vitals  Encounter Vitals Group     BP 10/11/22 1243 (!) 88/58     Systolic BP Percentile --      Diastolic BP Percentile --      Pulse Rate 10/11/22 1246 (!) 102     Resp 10/11/22 1243 18     Temp 10/11/22 1244 97.9 F (36.6 C)     Temp src --      SpO2 10/11/22 1246 93 %     Weight --      Height --      Head Circumference --      Peak Flow --      Pain Score 10/11/22 1245 6     Pain Loc --      Pain Education --      Exclude from Growth Chart --     Most recent vital signs: Vitals:   10/11/22 1530 10/11/22 1545  BP: 102/86 (!) 95/53  Pulse:  80  Resp: 17 20  Temp:    SpO2:  96%    Physical Exam Constitutional:      Appearance: She is well-developed.  HENT:     Head: Atraumatic.  Eyes:     Conjunctiva/sclera: Conjunctivae normal.  Cardiovascular:     Rate and Rhythm:  Regular rhythm.  Pulmonary:     Effort: No respiratory distress.  Abdominal:     General: There is no distension.     Tenderness: There is abdominal tenderness (Right sided tenderness to palpation to the right upper abdomen).  Musculoskeletal:        General: Normal range of motion.     Cervical back: Normal range of motion.     Right lower leg: No edema.     Left lower leg: No edema.  Skin:    General: Skin is warm.     Capillary Refill: Capillary refill takes less than 2 seconds.  Neurological:     Mental Status: She is alert. Mental status is at baseline.  Psychiatric:        Mood and Affect: Mood normal.     IMPRESSION / MDM / ASSESSMENT AND PLAN / ED COURSE  I reviewed the triage vital signs and the nursing notes.  On arrival patient has a low blood pressure of 80 systolic  Differential diagnosis including infectious process, dehydration, electrolyte abnormality, urinary tract infection, anemia, GI bleed, AKI.    No tachycardic or bradycardic dysrhythmias while on cardiac telemetry.  RADIOLOGY I independently reviewed imaging, my interpretation of imaging: CT chest abdomen and pelvis without contrast with no acute traumatic injury.  LABS (all labs ordered are listed, but only abnormal results are displayed) Labs interpreted as -    Labs Reviewed  CBC - Abnormal; Notable for the following components:      Result Value   WBC 13.2 (*)    All other components within normal limits  BASIC METABOLIC PANEL - Abnormal; Notable for the following components:   Sodium 132 (*)    Potassium 3.2 (*)    CO2 19 (*)    Glucose, Bld 181 (*)    BUN 56 (*)    Creatinine, Ser 1.83 (*)    GFR, Estimated 29 (*)    All other components within normal limits  HEPATIC FUNCTION PANEL - Abnormal; Notable for the following components:   AST 45 (*)    All other components within normal limits  URINALYSIS, W/ REFLEX TO CULTURE (INFECTION SUSPECTED) - Abnormal; Notable for the following  components:   Color, Urine YELLOW (*)    APPearance HAZY (*)    Glucose, UA 150 (*)    Hgb urine dipstick SMALL (*)    Leukocytes,Ua MODERATE (*)    Bacteria, UA RARE (*)    All other components within normal limits  LACTIC ACID, PLASMA - Abnormal; Notable for the following components:   Lactic Acid, Venous 2.1 (*)    All other components within normal limits  MAGNESIUM - Abnormal; Notable for the following components:   Magnesium 2.5 (*)    All other components within normal limits  RESP PANEL BY RT-PCR (RSV, FLU A&B, COVID)  RVPGX2  CULTURE, BLOOD (ROUTINE X 2)  CULTURE, BLOOD (ROUTINE X 2)  LIPASE, BLOOD  LACTIC ACID, PLASMA  PROCALCITONIN     MDM  Given 1 L of IV fluids on reevaluation continued to have a soft blood pressure so given a second liter of IV fluids.  No daily steroid use, have a low suspicion for adrenal insufficiency.  Findings of acute kidney injury with creatinine elevated at 1.8 from a baseline of 8, significantly elevated BUN of 56.  CO2 is 19.  Potassium 3.2 and hyponatremia.  Does have leukocytosis but no obvious infectious process.  CT scan without acute traumatic injury.  Consulted hospitalist for admission for hypotension and acute kidney injury.     PROCEDURES:  Critical Care performed: yes  .Critical Care  Performed by: Corena Herter, MD Authorized by: Corena Herter, MD   Critical care provider statement:    Critical care time (minutes):  45   Critical care time was exclusive of:  Separately billable procedures and treating other patients   Critical care was necessary to treat or prevent imminent or life-threatening deterioration of the following conditions:  Circulatory failure   Critical care was time spent personally by me on the following activities:  Development of treatment plan with patient or surrogate, discussions with consultants, evaluation of patient's response to treatment, examination of patient, ordering and review of  laboratory studies, ordering and review of radiographic studies, ordering and performing treatments and interventions, pulse oximetry, re-evaluation of patient's condition and review of old charts   Patient's presentation is most consistent with acute presentation with potential threat to life or bodily function.   MEDICATIONS ORDERED IN ED:  Medications  nicotine (NICODERM CQ - dosed in mg/24 hours) patch 14 mg (14 mg Transdermal Patch Applied 10/11/22 1357)  lactated ringers infusion (has no administration in time range)  acetaminophen (TYLENOL) tablet 650 mg (has no administration in time range)    Or  acetaminophen (TYLENOL) suppository 650 mg (has no administration in time range)  ondansetron (ZOFRAN) tablet 4 mg (has no administration in time range)    Or  ondansetron (ZOFRAN) injection 4 mg (has no administration in time range)  heparin injection 5,000 Units (has no administration in time range)  metroNIDAZOLE (FLAGYL) IVPB 500 mg (has no administration in time range)  senna-docusate (Senokot-S) tablet 1 tablet (has no administration in time range)  ipratropium-albuterol (DUONEB) 0.5-2.5 (3) MG/3ML nebulizer solution 3 mL (has no administration in time range)  insulin aspart (novoLOG) injection 0-9 Units (has no administration in time range)  insulin aspart (novoLOG) injection 0-5 Units (has no administration in time range)  nicotine (NICODERM CQ - dosed in mg/24 hours) patch 21 mg (has no administration in time range)    Or  nicotine polacrilex (NICORETTE) gum 4 mg (has no administration in time range)  midodrine (PROAMATINE) tablet 10 mg (has no administration in time range)  lactated ringers bolus 500 mL (has no administration in time range)  hydrALAZINE (APRESOLINE) injection 5 mg (has no administration in time range)  ceFEPIme (MAXIPIME) 2 g in sodium chloride 0.9 % 100 mL IVPB (has no administration in time range)  vancomycin (VANCOREADY) IVPB 1250 mg/250 mL (has no  administration in time range)  sodium chloride 0.9 % bolus 1,000 mL (0 mLs Intravenous Stopped 10/11/22 1413)  potassium chloride SA (KLOR-CON M) CR tablet 40 mEq (40 mEq Oral Given 10/11/22 1345)  sodium chloride 0.9 % bolus 1,000 mL (0 mLs Intravenous Stopped 10/11/22 1506)  ipratropium-albuterol (DUONEB) 0.5-2.5 (3) MG/3ML nebulizer solution 3 mL (3 mLs Nebulization Given 10/11/22 1533)    FINAL CLINICAL IMPRESSION(S) / ED DIAGNOSES   Final diagnoses:  Fall, initial encounter  AKI (acute kidney injury) (HCC)  Hypotension, unspecified hypotension type     Rx / DC Orders   ED Discharge Orders     None        Note:  This document was prepared using Dragon voice recognition software and may include unintentional dictation errors.   Corena Herter, MD 10/11/22 [phone]

## 2022-10-11 NOTE — ED Notes (Signed)
Patient's BP decreasing at this time. Doctor Cox notified.

## 2022-10-11 NOTE — ED Notes (Signed)
IV team at bedside 

## 2022-10-11 NOTE — Hospital Course (Addendum)
Ms. Samaya Boardley is a 72 year old female with history of tobacco use, COPD, depression, anxiety, GERD, non-insulin-dependent diabetes mellitus, hypertension, diffuse large B-cell lymphoma stage IV status post autologous stem cell transplant in 2018 and wbc + platelet engraftment in October 2018, chemotherapy and radiation at [facility] (no longer on chemotherapy (last tx 09/2019) and last rad tx was 12/2020), who presents ED for chief concerns of hypotension and weakness.  Vitals in the ED showed temperature of 97.9, respiration rate 18, heart rate of 102, blood pressure 88/58, improved to 102/86, SpO2 93% on room air.  Serum sodium is 132, potassium 3.2, chloride 98, bicarb 19, BUN of 56, serum creatinine 1.83, EGFR 29, nonfasting blood glucose 181, WBC 13.2, hemoglobin 14.6, platelets of 210.  Magnesium is 2.5.  Lactic acid was elevated at 2.1.  COVID/influenza A/influenza B/RSV PCR were negative.  CT CAP wo contrast: Was read as no acute findings in the chest, abdomen, pelvis.  Aortic atherosclerosis and emphysema.  ED treatment: DuoNebs one-time dose, nicotine patch, potassium chloride 40 mill equivalent p.o. one-time dose, sodium chloride 2 L bolus.

## 2022-10-11 NOTE — ED Notes (Signed)
Doctor Cox notified that patient's BP has continues to drop. Patient placed in trendelenburg at this time.

## 2022-10-11 NOTE — Assessment & Plan Note (Addendum)
Query septic shock Status post sodium chloride 2 L bolus per EDP Ordered additional LR 500 ml bolus on admission Continue with LR infusion at 150 mL/h, 20 hours ordered Midodrine 10 mg p.o. one-time dose ordered on admission Admit to inpatient, stepdown  Addendum: Received message from RN at approximately 17:07 that patient's blood pressure decreased to 82/48 (MAP of 59). I evaluated patient at bedside, she is sitting up eating dinner and states she is very hungry.  She says she feels much better now that she has got some food inside of her. She asked me if she can have a [employer] sandwich if her husband brings her some food. I recycled patient's blood pressure and it showed a MAP of 59 and then it improved to 104/61 (MAP 75). Discussed with RN, continue current treatment course

## 2022-10-11 NOTE — Assessment & Plan Note (Addendum)
Mild, suspect secondary to severe sepsis

## 2022-10-11 NOTE — ED Notes (Signed)
Pharmacy called due to patient having an acute kidney injury and several antibiotics ordered at this time. Pharmacy advised this RN to continue administering antibiotics. This RN was advised to begin with Maxipime and continue administering antibiotics one at a time.

## 2022-10-11 NOTE — Assessment & Plan Note (Signed)
Echo on 09/30/19 with EF 55-60%, G1DD Does not appear to be in acute exacerbation at this time

## 2022-10-11 NOTE — Assessment & Plan Note (Addendum)
As needed nicotine patch/gum for nicotine craving ordered Patient endorses readiness to stop tobacco use. Tobacco cessation counseling (> 3 minutes spent): Cigarettes are very expensive, you can find better ways to spend your heart or money Weeks one and two, smoke 9 cigarettes per day. Weeks three and four, smoke 8 cigarettes per day. Weeks five and six, smoke 7 cigarettes per day, continue until smoking half the amount of cigarettes per day Discuss with PCP for pharmacologic assistance with smoking cessation Avoid prolong interactions with individuals actively smoking cigarettes If you smoke in social setting, avoid social setting where cigarette smoking is expected or considered acceptable

## 2022-10-11 NOTE — ED Notes (Signed)
Date and time results received: 10/11/22 1513  Test: Lactic Acid Critical Value: 2.1  Name of Provider Notified: Arnoldo Morale, MD

## 2022-10-11 NOTE — Assessment & Plan Note (Addendum)
Present on admission, patient was diagnosed with UTI outpatient on Tuesday and was prescribed Keflex Patient reports that she was compliant with Keflex however a couple of days later, she developed nausea and every time she tried to take the Keflex she would start to vomit Continue treatment with broad-spectrum antibiotic for sepsis criteria given elevated lactic acid, leukocytosis, hypotension, bacteremia cannot be excluded at this time Cefepime, vancomycin per pharmacy

## 2022-10-11 NOTE — Progress Notes (Signed)
An USGPIV (ultrasound guided PIV) has been placed for short-term vasopressor infusion. A correctly placed ivWatch must be used when administering Vasopressors. Should this treatment be needed beyond 24 hours, central line access should be obtained.  It will be the responsibility of the bedside nurse to follow best practice to prevent extravasations. HS [employer]

## 2022-10-11 NOTE — ED Triage Notes (Signed)
Pt here via [facility] for fall at home. See first nurse note.

## 2022-10-11 NOTE — Assessment & Plan Note (Signed)
Patient has elevated lactic acid at 2.1, leukocytosis of 13.2, increased heart rate, initial septic shock, responded to IV fluid bolus Source workup in progress Blood cultures x 2 ordered UA ordered, is in process Check procalcitonin on admission Treat with antibiotic for unknown source of infection

## 2022-10-12 LAB — GLUCOSE, CAPILLARY
Glucose-Capillary: 169 mg/dL — ABNORMAL HIGH (ref 70–99)
Glucose-Capillary: 232 mg/dL — ABNORMAL HIGH (ref 70–99)
Glucose-Capillary: 163 mg/dL — ABNORMAL HIGH (ref 70–99)
Glucose-Capillary: 171 mg/dL — ABNORMAL HIGH (ref 70–99)

## 2022-10-12 LAB — CBC
HCT: 32.3 % — ABNORMAL LOW (ref 36.0–46.0)
Hemoglobin: 11 g/dL — ABNORMAL LOW (ref 12.0–15.0)
MCH: 30.4 pg (ref 26.0–34.0)
MCHC: 34.1 g/dL (ref 30.0–36.0)
MCV: 89.2 fL (ref 80.0–100.0)
Platelets: 192 10*3/uL (ref 150–400)
RBC: 3.62 MIL/uL — ABNORMAL LOW (ref 3.87–5.11)
RDW: 14.5 % (ref 11.5–15.5)
WBC: 7.1 10*3/uL (ref 4.0–10.5)
nRBC: 0 % (ref 0.0–0.2)

## 2022-10-12 LAB — URINALYSIS, W/ REFLEX TO CULTURE (INFECTION SUSPECTED)
Bilirubin Urine: NEGATIVE
Glucose, UA: >=500 mg/dL — AB
Hgb urine dipstick: NEGATIVE
Ketones, ur: NEGATIVE mg/dL
Nitrite: NEGATIVE
Protein, ur: NEGATIVE mg/dL
Specific Gravity, Urine: 1.013 (ref 1.005–1.030)
pH: 5 (ref 5.0–8.0)

## 2022-10-12 LAB — BETA-HYDROXYBUTYRIC ACID: Beta-Hydroxybutyric Acid: 0.06 mmol/L (ref 0.05–0.27)

## 2022-10-12 LAB — CBG MONITORING, ED: Glucose-Capillary: 206 mg/dL — ABNORMAL HIGH (ref 70–99)

## 2022-10-12 LAB — BASIC METABOLIC PANEL
Anion gap: 10 (ref 5–15)
BUN: 31 mg/dL — ABNORMAL HIGH (ref 8–23)
CO2: 17 mmol/L — ABNORMAL LOW (ref 22–32)
Calcium: 8 mg/dL — ABNORMAL LOW (ref 8.9–10.3)
Chloride: 110 mmol/L (ref 98–111)
Creatinine, Ser: 0.88 mg/dL (ref 0.44–1.00)
GFR, Estimated: >60 mL/min (ref >60)
Glucose, Bld: 192 mg/dL — ABNORMAL HIGH (ref 70–99)
Potassium: 4.1 mmol/L (ref 3.5–5.1)
Sodium: 137 mmol/L (ref 135–145)

## 2022-10-12 LAB — HEMOGLOBIN A1C
Hgb A1c MFr Bld: 6.6 % — ABNORMAL HIGH (ref 4.8–5.6)
Mean Plasma Glucose: 142.72 mg/dL

## 2022-10-12 LAB — T4, FREE: Free T4: 0.7 ng/dL (ref 0.61–1.12)

## 2022-10-12 LAB — MRSA NEXT GEN BY PCR, NASAL: MRSA by PCR Next Gen: NOT DETECTED

## 2022-10-12 LAB — SARS CORONAVIRUS 2 BY RT PCR: SARS Coronavirus 2 by RT PCR: NEGATIVE

## 2022-10-12 LAB — CORTISOL-AM, BLOOD: Cortisol - AM: 6.1 ug/dL — ABNORMAL LOW (ref 6.7–22.6)

## 2022-10-12 MED ORDER — NICOTINE 14 MG/24HR TD PT24: 14.0000 mg | MEDICATED_PATCH | Freq: Every day | TRANSDERMAL | Status: DC

## 2022-10-12 MED ORDER — CHLORHEXIDINE GLUCONATE CLOTH 2 % EX PADS
6.0000 | MEDICATED_PAD | Freq: Every day | CUTANEOUS | Status: DC
  Administered 2022-10-12: 6 via TOPICAL

## 2022-10-12 MED ORDER — MIDODRINE HCL 5 MG PO TABS
10.0000 mg | ORAL_TABLET | Freq: Three times a day (TID) | ORAL | Status: DC
  Administered 2022-10-12 – 2022-10-13 (×5): 10 mg via ORAL
  Filled 2022-10-12 (×5): qty 2

## 2022-10-12 MED ORDER — SODIUM CHLORIDE 0.9 % IV SOLN
1.0000 g | Freq: Every day | INTRAVENOUS | Status: DC
  Administered 2022-10-12 – 2022-10-14 (×3): 1 g via INTRAVENOUS
  Filled 2022-10-12 (×3): qty 10

## 2022-10-12 MED ORDER — ALBUTEROL SULFATE (2.5 MG/3ML) 0.083% IN NEBU
INHALATION_SOLUTION | RESPIRATORY_TRACT | Status: AC
  Administered 2022-10-12: 2.5 mg
  Filled 2022-10-12: qty 3

## 2022-10-12 MED ORDER — NICOTINE 14 MG/24HR TD PT24
14.0000 mg | MEDICATED_PATCH | Freq: Every day | TRANSDERMAL | Status: DC
  Administered 2022-10-12 – 2022-10-14 (×3): 14 mg via TRANSDERMAL
  Filled 2022-10-12 (×3): qty 1

## 2022-10-12 MED ORDER — LACTATED RINGERS IV SOLN: INTRAVENOUS | Status: DC

## 2022-10-12 MED ORDER — IPRATROPIUM-ALBUTEROL 0.5-2.5 (3) MG/3ML IN SOLN
3.0000 mL | Freq: Four times a day (QID) | RESPIRATORY_TRACT | Status: DC | PRN
  Administered 2022-10-13: 3 mL via RESPIRATORY_TRACT
  Filled 2022-10-12: qty 3

## 2022-10-12 NOTE — Plan of Care (Signed)
  Problem: Education: Goal: Knowledge of risk factors and measures for prevention of condition will improve Outcome: Progressing   Problem: Coping: Goal: Psychosocial and spiritual needs will be supported Outcome: Progressing   Problem: Respiratory: Goal: Will maintain a patent airway Outcome: Progressing Goal: Complications related to the disease process, condition or treatment will be avoided or minimized Outcome: Progressing   Problem: Fluid Volume: Goal: Hemodynamic stability will improve Outcome: Progressing   Problem: Coping: Goal: Ability to adjust to condition or change in health will improve Outcome: Progressing   Problem: Metabolic: Goal: Ability to maintain appropriate glucose levels will improve Outcome: Progressing   Problem: Health Behavior/Discharge Planning: Goal: Ability to identify and utilize available resources and services will improve Outcome: Progressing Goal: Ability to manage health-related needs will improve Outcome: Progressing

## 2022-10-12 NOTE — Progress Notes (Signed)
Updated pt and pts spouse regarding pts condition and current plan of care.  All questions were answered and they were appreciative to receive an update.  Zada Girt, AGNP  Pulmonary/Critical Care Pager [phone] (please enter 7 digits) PCCM Consult Pager [phone] (please enter 7 digits)

## 2022-10-13 DIAGNOSIS — A419 Sepsis, unspecified organism: Secondary | ICD-10-CM | POA: Diagnosis not present

## 2022-10-13 DIAGNOSIS — R6520 Severe sepsis without septic shock: Secondary | ICD-10-CM | POA: Diagnosis not present

## 2022-10-13 LAB — GLUCOSE, CAPILLARY
Glucose-Capillary: 96 mg/dL (ref 70–99)
Glucose-Capillary: 126 mg/dL — ABNORMAL HIGH (ref 70–99)
Glucose-Capillary: 144 mg/dL — ABNORMAL HIGH (ref 70–99)
Glucose-Capillary: 162 mg/dL — ABNORMAL HIGH (ref 70–99)

## 2022-10-13 LAB — T3, FREE: T3, Free: 2.6 pg/mL (ref 2.0–4.4)

## 2022-10-13 MED ORDER — ENOXAPARIN SODIUM 40 MG/0.4ML IJ SOSY
40.0000 mg | PREFILLED_SYRINGE | INTRAMUSCULAR | Status: DC
  Administered 2022-10-13: 40 mg via SUBCUTANEOUS
  Filled 2022-10-13: qty 0.4

## 2022-10-13 MED ORDER — MIDODRINE HCL 5 MG PO TABS: 5.0000 mg | ORAL_TABLET | Freq: Three times a day (TID) | ORAL | Status: DC

## 2022-10-13 NOTE — Progress Notes (Signed)
Called report to [facility], however patient is refusing to move rooms and wants to be discharged. Patient stated "If I'm not being discharged I will sign myself out". Dr. Fran Lowes made aware via secure chat and said she will be at bedside shortly.

## 2022-10-13 NOTE — Progress Notes (Signed)
Attempted to call report, Secretary stated RN in the middle of medication admin. and asked to call back.

## 2022-10-13 NOTE — Progress Notes (Signed)
PROGRESS NOTE    Beth Stanley  ZOX:[id number] DOB: 1950/04/01 DOA: 10/11/2022 PCP: Erskine Emery, NP  [id number]  LOS: 2 days   Brief hospital course:   Assessment & Plan: Ms. Beth Stanley is a 72 year old female with history of tobacco use, COPD, depression, anxiety, GERD, non-insulin-dependent diabetes mellitus, hypertension, diffuse large B-cell lymphoma stage IV status post autologous stem cell transplant in 2018 and wbc + platelet engraftment in October 2018, chemotherapy and radiation at [facility], who presents ED for chief concerns of hypotension and weakness.    * Septic shock --tachycardia, leukocytosis, source UTI. --Pt was hypotensive on admission, BP didn't improve with IVF boluses, pt was therefore transferred to [facility] for levo gtt.  Pt was also started on midodrine 10 mg TID.  Levo gtt tapered off on 9/29, and pt was transferred back to hospitalist service on 9/30.  UTI (urinary tract infection) --patient was diagnosed with UTI outpatient on 9/24 and was prescribed Keflex. Patient reports that she was compliant with Keflex however a couple of days later, she developed nausea and every time she tried to take the Keflex she would start to vomit. --started on vanc/cefepime/flagyl on presentation, de-escalated to ceftriaxone. --urine cx was not collected on presentation. --cont ceftriaxone for empiric tx  Hypotension --presume due to septic shock.  Off pressor now. --taper midodrine down to 5 mg TID.  AKI (acute kidney injury) (HCC) --Cr 1.83 on presentation.  Improved to 0.88 the next day after IVF. --oral hydration now  Centrilobular emphysema (HCC) --stable  Hypokalemia --monitor and supplement PRN  Muscular abdominal pain in right flank Suspect secondary to trauma, pain from her fall at home on Wednesday As needed lidocaine patch ordered  Tobacco dependence Patient endorses readiness to stop tobacco use. Tobacco cessation counseling provided by  admitting physician.  Chronic prescription opiate use - subxone Rx'd by Courtney Paris, NP at [facility] Suboxone 8-2 sublingual, patient takes this daily instead of the prescribed 3 times daily. --cont Suboxone 1 tab daily  Diabetes mellitus type 2, noninsulin dependent (HCC) --d/c BG checks and SSI  Chronic dCHF Echo on 09/30/19 with EF 55-60%, G1DD Does not appear to be in acute exacerbation at this time  Elevated SGOT (AST) Mild, not significant  Hx of Hypertension --has been hypotensive --Hold home ramipril    DVT prophylaxis: Lovenox SQ Code Status: Full code  Family Communication:  Level of care: Med-Surg Dispo:   The patient is from: home Anticipated d/c is to: home Anticipated d/c date is: 1-2 days   Subjective and Interval History:  Pt reported feeling fine.  Dysuria resolved yesterday.   Objective: Vitals:   10/13/22 1200 10/13/22 1300 10/13/22 1326 10/13/22 1530  BP:   (!) 127/54 (!) 110/90  Pulse: (!) 59 76  64  Resp: 15   18  Temp:    98.3 F (36.8 C)  TempSrc:      SpO2: 93% 97%  96%  Weight:      Height:        Intake/Output Summary (Last 24 hours) at 10/13/2022 1843 Last data filed at 10/13/2022 1837 Gross per 24 hour  Intake 1332.91 ml  Output 1000 ml  Net 332.91 ml   Filed Weights   10/11/22 1614 10/12/22 0756  Weight: 62.2 kg 62.9 kg    Examination:   Constitutional: NAD, AAOx3 HEENT: conjunctivae and lids normal, EOMI CV: No cyanosis.   RESP: normal respiratory effort, on RA Neuro: II - XII grossly intact.  Psych: Normal mood and affect.  Appropriate judgement and reason   Data Reviewed: I have personally reviewed labs and imaging studies  Time spent: 50 minutes  Darlin Priestly, MD [facility] If 7PM-7AM, please contact night-coverage 10/13/2022, 6:43 PM

## 2022-10-14 ENCOUNTER — Other Ambulatory Visit: Payer: Self-pay | Admitting: *Deleted

## 2022-10-14 DIAGNOSIS — R6520 Severe sepsis without septic shock: Secondary | ICD-10-CM | POA: Diagnosis not present

## 2022-10-14 DIAGNOSIS — N6341 Unspecified lump in right breast, subareolar: Secondary | ICD-10-CM | POA: Diagnosis not present

## 2022-10-14 DIAGNOSIS — A419 Sepsis, unspecified organism: Secondary | ICD-10-CM | POA: Diagnosis not present

## 2022-10-14 LAB — CBC
HCT: 32.1 % — ABNORMAL LOW (ref 36.0–46.0)
Hemoglobin: 10.6 g/dL — ABNORMAL LOW (ref 12.0–15.0)
MCH: 30.3 pg (ref 26.0–34.0)
MCHC: 33 g/dL (ref 30.0–36.0)
MCV: 91.7 fL (ref 80.0–100.0)
Platelets: 143 10*3/uL — ABNORMAL LOW (ref 150–400)
RBC: 3.5 MIL/uL — ABNORMAL LOW (ref 3.87–5.11)
RDW: 14.6 % (ref 11.5–15.5)
WBC: 6.7 10*3/uL (ref 4.0–10.5)
nRBC: 0 % (ref 0.0–0.2)

## 2022-10-14 LAB — BASIC METABOLIC PANEL
Anion gap: 6 (ref 5–15)
BUN: 20 mg/dL (ref 8–23)
CO2: 24 mmol/L (ref 22–32)
Calcium: 8.5 mg/dL — ABNORMAL LOW (ref 8.9–10.3)
Chloride: 109 mmol/L (ref 98–111)
Creatinine, Ser: 0.85 mg/dL (ref 0.44–1.00)
GFR, Estimated: >60 mL/min (ref >60)
Glucose, Bld: 110 mg/dL — ABNORMAL HIGH (ref 70–99)
Potassium: 3.6 mmol/L (ref 3.5–5.1)
Sodium: 139 mmol/L (ref 135–145)

## 2022-10-14 LAB — MAGNESIUM: Magnesium: 2.1 mg/dL (ref 1.7–2.4)

## 2022-10-14 MED ORDER — CEFADROXIL 500 MG PO CAPS: 500.0000 mg | ORAL_CAPSULE | Freq: Two times a day (BID) | ORAL | 0 refills | Status: AC

## 2022-10-14 MED ORDER — RAMIPRIL 2.5 MG PO CAPS: ORAL_CAPSULE | ORAL | Status: DC

## 2022-10-14 MED ORDER — DIPHENOXYLATE-ATROPINE 2.5-0.025 MG PO TABS: 1.0000 | ORAL_TABLET | Freq: Every day | ORAL | Status: AC | PRN

## 2022-10-14 MED ORDER — TROLAMINE SALICYLATE 10 % EX CREA: TOPICAL_CREAM | CUTANEOUS | Status: AC | PRN

## 2022-10-14 MED ORDER — SERTRALINE HCL 100 MG PO TABS: 150.0000 mg | ORAL_TABLET | Freq: Every day | ORAL | Status: AC

## 2022-10-14 MED ORDER — BISACODYL 5 MG PO TBEC
5.0000 mg | DELAYED_RELEASE_TABLET | Freq: Once | ORAL | Status: AC
  Administered 2022-10-14: 5 mg via ORAL
  Filled 2022-10-14: qty 1

## 2022-10-14 MED ORDER — TROLAMINE SALICYLATE 10 % EX CREA
TOPICAL_CREAM | CUTANEOUS | Status: DC | PRN
  Filled 2022-10-14: qty 85

## 2022-10-14 MED ORDER — BUPRENORPHINE HCL-NALOXONE HCL 8-2 MG SL FILM: 1.0000 | ORAL_FILM | Freq: Every day | SUBLINGUAL | Status: AC

## 2022-10-14 NOTE — Evaluation (Signed)
Physical Therapy Evaluation Patient Details Name: Beth Stanley MRN: [id number] DOB: 09/30/1950 Today's Date: 10/14/2022  History of Present Illness  Patient is a 72 year old female with history of tobacco use, COPD, depression, anxiety, GERD, non-insulin-dependent diabetes mellitus, hypertension, diffuse large B-cell lymphoma stage IV status post autologous stem cell transplant in 2018 and wbc + platelet engraftment in October 2018, chemotherapy and radiation at [facility], who presents ED for chief concerns of hypotension and weakness. Current MD assessment: Septic shock, UTI, hypotension, AKI, centrilobular emphysema, and muscular abdominal pain in right flank.  Clinical Impression  Pt was pleasant and motivated to participate during the session and put forth good effort throughout. Pt is mod I for bed mobility, and supervision for transfers and ambulation with RW. Able to perform 70 feet x1 and 30 feet x1 with RW and supervision assist. Consistent cues to manage RW and keep close to body, no imbalance or SOB noted. Able to perform 2x 4 stairs ascending and descending with bilateral hand rail use, at CGA. Pt has alternating step pattern ascending and step to pattern descending. Cues provided for proper hand placement when descending. Pt will benefit from continued PT services upon discharge to safely address deficits listed in patient problem list for decreased caregiver assistance and eventual return to PLOF.          If plan is discharge home, recommend the following: A little help with walking and/or transfers;A little help with bathing/dressing/bathroom;Assistance with cooking/housework;Assist for transportation;Help with stairs or ramp for entrance   Can travel by private vehicle        Equipment Recommendations None recommended by PT  Recommendations for Other Services       Functional Status Assessment Patient has had a recent decline in their functional status and demonstrates  the ability to make significant improvements in function in a reasonable and predictable amount of time.     Precautions / Restrictions Precautions Precautions: Fall Restrictions Weight Bearing Restrictions: No      Mobility  Bed Mobility Overal bed mobility: Modified Independent Bed Mobility: Supine to Sit     Supine to sit: Modified independent (Device/Increase time)     General bed mobility comments: No physical assist, increased time needed    Transfers Overall transfer level: Needs assistance Equipment used: Rolling walker (2 wheels) Transfers: Sit to/from Stand Sit to Stand: Supervision           General transfer comment: Multiple STS performed from variable heightes and surfaces, never needing assistance or cues to complete    Ambulation/Gait Ambulation/Gait assistance: Supervision Gait Distance (Feet): 70 Feet x1, 30 feet x1 Assistive device: Rolling walker (2 wheels) Gait Pattern/deviations: Trunk flexed, Decreased step length - right, Decreased step length - left, Decreased stride length, Step-through pattern Gait velocity: decreased     General Gait Details: notably very slow. Per spousereports she typically walks slightly faster than her current ambulatory state. cues provided for AD management and upright posture. no imablance or SOB noted. Per pt, limited by soreness from recent fall  Stairs Stairs: Yes Stairs assistance: Contact guard assist Stair Management: Two rails Number of Stairs: 4 General stair comments: 4 steps x2 ascending and descending, use of both hand rails, overall slow but steady with good eccentric/concentric control. no imbalance  Wheelchair Mobility     Tilt Bed    Modified Rankin (Stroke Patients Only)       Balance Overall balance assessment: Needs assistance   Sitting balance-Leahy Scale: Normal  Standing balance support: During functional activity, Bilateral upper extremity supported Standing balance-Leahy  Scale: Fair Standing balance comment: static with RW                             Pertinent Vitals/Pain Pain Assessment Pain Assessment: 0-10 Pain Score: 7  Pain Location: R abdomen/flank Pain Descriptors / Indicators: Sore Pain Intervention(s): Monitored during session, Limited activity within patient's tolerance    Home Living Family/patient expects to be discharged to:: Private residence Living Arrangements: Spouse/significant other Available Help at Discharge: Family;Available 24 hours/day Type of Home: House Home Access: Stairs to enter Entrance Stairs-Rails: Right;Left;Can reach both Entrance Stairs-Number of Steps: 7   Home Layout: Able to live on main level with bedroom/bathroom Home Equipment: Agricultural consultant (2 wheels);Cane - single point;Grab bars - toilet      Prior Function Prior Level of Function : History of Falls (last six months);Independent/Modified Independent;Working/employed;Driving             Mobility Comments: community ambulator with SPC at baseline, working, notes recent fall (admisson reason), and one additional fall where she got tripped up ADLs Comments: Ind     Extremity/Trunk Assessment   Upper Extremity Assessment Upper Extremity Assessment: Generalized weakness    Lower Extremity Assessment Lower Extremity Assessment: Generalized weakness    Cervical / Trunk Assessment Cervical / Trunk Assessment: Normal  Communication   Communication Communication: Hearing impairment  Cognition Arousal: Alert Behavior During Therapy: WFL for tasks assessed/performed, Flat affect Overall Cognitive Status: Within Functional Limits for tasks assessed                                          General Comments      Exercises     Assessment/Plan    PT Assessment Patient needs continued PT services  PT Problem List Decreased strength;Decreased range of motion;Decreased activity tolerance;Decreased balance;Decreased  mobility;Decreased coordination;Pain       PT Treatment Interventions DME instruction;Balance training;Gait training;Stair training;Functional mobility training;Therapeutic activities;Therapeutic exercise    PT Goals (Current goals can be found in the Care Plan section)  Acute Rehab PT Goals Patient Stated Goal: get home PT Goal Formulation: With patient Time For Goal Achievement: 10/27/22 Potential to Achieve Goals: Good    Frequency Min 1X/week     Co-evaluation               AM-PAC PT "6 Clicks" Mobility  Outcome Measure Help needed turning from your back to your side while in a flat bed without using bedrails?: None Help needed moving from lying on your back to sitting on the side of a flat bed without using bedrails?: None Help needed moving to and from a bed to a chair (including a wheelchair)?: None Help needed standing up from a chair using your arms (e.g., wheelchair or bedside chair)?: A Little Help needed to walk in hospital room?: A Little Help needed climbing 3-5 steps with a railing? : A Little 6 Click Score: 21    End of Session Equipment Utilized During Treatment: Gait belt Activity Tolerance: Patient tolerated treatment well Patient left: in chair;with nursing/sitter in room;with family/visitor present;with call bell/phone within reach;with chair alarm set Nurse Communication: Mobility status PT Visit Diagnosis: Unsteadiness on feet (R26.81);Other abnormalities of gait and mobility (R26.89);Difficulty in walking, not elsewhere classified (R26.2);Pain Pain - Right/Left: Right Pain -  part of body:  (abdomen/flank)    Time: 9563-8756 PT Time Calculation (min) (ACUTE ONLY): 49 min   Charges:                 Cecile Sheerer, SPT 10/14/22, 1:08 PM

## 2022-10-14 NOTE — TOC Transition Note (Signed)
Transition of Care [facility]) - CM/SW Discharge Note   Patient Details  Name: Beth Stanley MRN: [id number] Date of Birth: 05-04-1950  [facility] CM/SW Contact:  Chapman Fitch, RN Phone Number: 10/14/2022, 12:37 PM   Clinical Narrative:      Met with patient and husband at bedside Notified by MD discharge today, notified by PT that home health recommended.  Patient and husband declined home health, not interested in outpatient therapy.  Patient requests for PT to give her some routines that she can do at home.  Genene Churn with PT notified.  MD updated        Patient Goals and CMS Choice      Discharge Placement                         Discharge Plan and Services Additional resources added to the After Visit Summary for                                       Social Determinants of Health (SDOH) Interventions SDOH Screenings   Food Insecurity: No Food Insecurity (03/05/2022)  Housing: Low Risk  (03/05/2022)  Transportation Needs: No Transportation Needs (03/05/2022)  Utilities: Not At Risk (03/05/2022)  Alcohol Screen: Low Risk  (04/14/2018)  Depression (PHQ2-9): Low Risk  (04/14/2018)  Tobacco Use: High Risk (10/11/2022)     Readmission Risk Interventions     No data to display

## 2022-10-14 NOTE — Consult Note (Addendum)
[facility] SURGICAL CONSULTATION NOTE (initial) - cpt: 34742   HISTORY OF PRESENT ILLNESS (HPI):  72 y.o. female presenting initially to the ED on 09/28 secondary to a fall at home. Unfortunately, she was found to have severe UTI and septic shock. Ultimately requiring ICU admission and vasopressor support. Fortunately, she responded well to Abx and was weaned from vasopressor support. She continued to progress well with plans for discharge home today. However, she reported starting to feel a knot in her right breast for the last two days. This was located at the nipple. This is non-tender. No erythema, drainage, or nipple changes. She is without fever, chills. She does report being up to date on her mammograms. Of note, she does also have port-a-catheter in the right chest secondary to a history of lymphoma. Most recent labs show a normal WBC at 6.7K, Hgb to 10.6, sCr normal at 0.85, no electrolyte derangements. She has received 4 days of IV rocephin.   Surgery is consulted by hospitalist physician Dr. Darlin Priestly, MD in this context for evaluation and management of possible right breast infection.  PAST MEDICAL HISTORY (PMH):  Past Medical History:  Diagnosis Date  . Acute metabolic encephalopathy 02/20/2022  . Anxiety   . Arthritis   . C. difficile colitis 10/01/2019  . COVID-19 virus infection 09/29/2019  . Diabetes (HCC)   . Heartburn   . History of stem cell transplant (HCC)   . HTN (hypertension)   . Multifocal pneumonia 04/19/2021  . Non Hodgkin's lymphoma (HCC) 2004   Hx chemo tx's.      PAST SURGICAL HISTORY (PSH):  Past Surgical History:  Procedure Laterality Date  . TOTAL ABDOMINAL HYSTERECTOMY       MEDICATIONS:  Prior to Admission medications   Medication Sig Start Date End Date Taking? Authorizing Provider  acetaminophen (TYLENOL) 500 MG tablet Take 1,000 mg by mouth 2 (two) times daily as needed for mild pain.   Yes [provider]  albuterol  (VENTOLIN HFA) 108 (90 Base) MCG/ACT inhaler Inhale 1 puff into the lungs every 6 (six) hours as needed for wheezing. 02/21/22  Yes Marrion Coy, MD  Buprenorphine HCl-Naloxone HCl 8-2 MG FILM Place 1 strip under the tongue in the morning, at noon, and at bedtime. 11/04/20  Yes [provider]  carboxymethylcellulose (REFRESH PLUS) 0.5 % SOLN Place 1 drop into the left eye 2 (two) times daily. 10/19/20  Yes [provider]  cephALEXin (KEFLEX) 500 MG capsule Take 500 mg by mouth 3 (three) times daily.   Yes [provider]  Cholecalciferol 1.25 MG (50000 UT) capsule Take 1 tablet by mouth once a week.   Yes [provider]  cyanocobalamin (VITAMIN B12) 1000 MCG tablet Take 1,000 mcg by mouth daily.   Yes [provider]  diphenoxylate-atropine (LOMOTIL) 2.5-0.025 MG tablet Take 1 tablet by mouth 4 (four) times daily.   Yes [provider]  docusate sodium (COLACE) 100 MG capsule Take 100 mg by mouth daily as needed for mild constipation.   Yes [provider]  lidocaine-prilocaine (EMLA) cream Apply 1 Application topically as needed (for port access). 07/22/18  Yes [provider]  magnesium oxide (MAG-OX) 400 (240 Mg) MG tablet Take 1 tablet by mouth daily. 10/01/20  Yes [provider]  meloxicam (MOBIC) 7.5 MG tablet Take 7.5 mg by mouth 2 (two) times daily.   Yes [provider]  omeprazole (PRILOSEC) 20 MG capsule Take 20 mg by mouth daily. 08/25/20  Yes [provider]  ondansetron (ZOFRAN) 4 MG tablet Take 4 mg by mouth every 6 (six) hours as needed for nausea. 05/05/19  Yes [provider]  potassium chloride SA (KLOR-CON) 20 MEQ tablet Take 20 mEq by mouth daily. 07/28/19  Yes [provider]  ramipril (ALTACE) 2.5 MG capsule Take 1 capsule (2.5 mg total) by mouth daily. 08/08/17  Yes Ofilia Neas, PA-C  sertraline (ZOLOFT) 100 MG tablet TAKE ONE (1) TABLET EACH DAY Patient taking  differently: Take 150 mg by mouth daily. 06/10/17  Yes Ofilia Neas, PA-C  valACYclovir (VALTREX) 1000 MG tablet Take 1,000 mg by mouth daily. 08/22/20  Yes [provider]  b complex vitamins capsule Take 1 capsule by mouth daily.    [provider]  estradiol (ESTRACE) 0.1 MG/GM vaginal cream Place 1 Applicatorful vaginally daily. Patient not taking: Reported on 10/11/2022    [provider]  ferrous sulfate 325 (65 FE) MG tablet Take 325 mg by mouth 3 (three) times a week. Monday. Weds, fri 02/11/21   [provider]  JARDIANCE 25 MG TABS tablet Take 25 mg by mouth daily. 08/17/20   [provider]  methocarbamol (ROBAXIN) 500 MG tablet Take 500 mg by mouth 2 (two) times daily. Patient not taking: Reported on 10/11/2022    [provider]  TRELEGY ELLIPTA 100-62.5-25 MCG/ACT AEPB Inhale 1 puff into the lungs daily. Patient not taking: Reported on 10/11/2022 04/04/21   [provider]     ALLERGIES:  Allergies  Allergen Reactions  . Aspirin     Stomach cramp  Other Reaction(s): abdominal pain, Not available  . Latex Swelling    Other Reaction(s): Angioedema, Not available  . Other   . Tape Rash    Makes Whelps on Skin Makes Whelps on Skin  . Wound Dressing Adhesive Rash    Makes Whelps on Skin, Prefers paper tape     SOCIAL HISTORY:  Social History   Socioeconomic History  . Marital status: Married    Spouse name: Molly Maduro   . Number of children: 1  . Years of education: 61  . Highest education level: Not on file  Occupational History  . Not on file  Tobacco Use  . Smoking status: Every Day    Current packs/day: 0.50    Types: Cigarettes  . Smokeless tobacco: Never  Substance and Sexual Activity  . Alcohol use: Not Currently    Alcohol/week: 0.0 standard drinks of alcohol    Comment: Wine ocass  . Drug use: No  . Sexual activity: Not Currently  Other Topics Concern  . Not on file  Social History Narrative    Lives w/ husband   Caffeine use: Drinks 6 cups per day   Right-handed   Social Determinants of Health   Financial Resource Strain: Not on file  Food Insecurity: No Food Insecurity (03/05/2022)   Hunger Vital Sign   . Worried About Programme researcher, broadcasting/film/video in the Last Year: Never true   . Ran Out of Food in the Last Year: Never true  Transportation Needs: No Transportation Needs (03/05/2022)   PRAPARE - Transportation   . Lack of Transportation (Medical): No   . Lack of Transportation (Non-Medical): No  Physical Activity: Not on file  Stress: Not on file  Social Connections: Not on file  Intimate Partner Violence: Not At Risk (03/05/2022)   Humiliation, Afraid, Rape, and Kick questionnaire   . Fear of Current or Ex-Partner: No   .  Emotionally Abused: No   . Physically Abused: No   . Sexually Abused: No     FAMILY HISTORY:  Family History  Problem Relation Age of Onset  . Diabetes Mother   . Hypertension Father   . Lung cancer Sister   . Hypertension Brother   . Lung cancer Brother   . Kidney cancer Brother        removed kidney   . Hypertension Brother   . Lung cancer Brother   . Prostate cancer Neg Hx   . Bladder Cancer Neg Hx       REVIEW OF SYSTEMS:  Review of Systems  Constitutional:  Negative for chills and fever.  Respiratory:  Negative for cough and shortness of breath.   Cardiovascular:  Negative for chest pain and palpitations.  Gastrointestinal:  Negative for abdominal pain, nausea and vomiting.  Skin:  Negative for itching and rash.       + Question of right breast knot   All other systems reviewed and are negative.   VITAL SIGNS:  Temp:  [97.8 F (36.6 C)-98.3 F (36.8 C)] 97.8 F (36.6 C) (10/01 0752) Pulse Rate:  [57-78] 69 (10/01 0752) Resp:  [15-22] 16 (10/01 0752) BP: (110-139)/(41-90) 139/68 (10/01 0752) SpO2:  [93 %-100 %] 98 % (10/01 0752)     Height: 5\' 2"  (157.5 cm) Weight: 62.9 kg BMI (Calculated): 25.36   INTAKE/OUTPUT:  09/30 0701 -  10/01 0700 In: 520.9 [P.O.:118; I.V.:302.9; IV Piggyback:100] Out: 400 [Urine:400]  PHYSICAL EXAM:  Physical Exam Vitals and nursing note reviewed. Exam conducted with a chaperone present.  Constitutional:      General: She is not in acute distress.    Appearance: Normal appearance. She is normal weight. She is not ill-appearing.     Comments: Patient sitting up in bed; NAD. Husband at bedside   HENT:     Head: Normocephalic and atraumatic.  Eyes:     General: No scleral icterus.    Conjunctiva/sclera: Conjunctivae normal.  Pulmonary:     Effort: Pulmonary effort is normal. No respiratory distress.  Chest:  Breasts:    Right: Normal. No inverted nipple, mass, nipple discharge, skin change or tenderness.       Comments: Right Breast: There is a small (<1 cm) punctate are on the right nipple itself, there is no expressible drainage, no erythema, this is non-tender, no inversion. Otherwise right breast examination is benign without appreciable masses, No LAD in the right supraclavicular, axillary, or pectoral region  Genitourinary:    Comments: Deferred Lymphadenopathy:     Upper Body:     Right upper body: No supraclavicular, axillary or pectoral adenopathy.  Skin:    General: Skin is warm and dry.     Findings: No erythema.  Neurological:     General: No focal deficit present.     Mental Status: She is alert and oriented to person, place, and time. Mental status is at baseline.  Psychiatric:        Mood and Affect: Mood normal.        Behavior: Behavior normal.     Labs:     Latest Ref Rng & Units 10/14/2022    5:26 AM 10/12/2022    5:40 AM 10/11/2022   12:48 PM  CBC  WBC 4.0 - 10.5 K/uL 6.7  7.1  13.2   Hemoglobin 12.0 - 15.0 g/dL 40.9  81.1  91.4   Hematocrit 36.0 - 46.0 % 32.1  32.3  45.0  Platelets 150 - 400 K/uL 143  192  210       Latest Ref Rng & Units 10/14/2022    5:26 AM 10/12/2022    4:06 AM 10/11/2022    1:22 PM  CMP  Glucose 70 - 99 mg/dL 161  096     BUN 8 - 23 mg/dL 20  31    Creatinine 0.45 - 1.00 mg/dL 4.09  8.11    Sodium 914 - 145 mmol/L 139  137    Potassium 3.5 - 5.1 mmol/L 3.6  4.1    Chloride 98 - 111 mmol/L 109  110    CO2 22 - 32 mmol/L 24  17    Calcium 8.9 - 10.3 mg/dL 8.5  8.0    Total Protein 6.5 - 8.1 g/dL   7.8   Total Bilirubin 0.3 - 1.2 mg/dL   0.7   Alkaline Phos 38 - 126 U/L   65   AST 15 - 41 U/L   45   ALT 0 - 44 U/L   25      Imaging studies:  No new pertinent imaging    Assessment/Plan: 72 y.o. female admitted with now clinically resolved urosepsis, concerning for possible right breast knot although is without evidence of infection nor appreciable mass   - Fortunately, right breast examination is essentially benign. There is a very small (<1 cm) punctate area on the nipple itself., Thee is no expressible drainage from this. There are no overlaying skin changes and the area is non-tender. My clinical suspicion for infection is low. Otherwise breast examination is benign. Out of abundance of caution, I do think we can finish course of PO Abx for 7 days total (IV + PO). I do not think there is indication for I&D. We will be happy to be a resource as needed as outpatient should things change. Reviewed signs and symptoms of infection/abscess. Patient and husband agreeable. All questions addressed and answered.   All of the above findings and recommendations were discussed with the patient and her family, and all of their questions were answered to their expressed satisfaction.  Thank you for the opportunity to participate in this patient's care.   -- Lynden Oxford, PA-C [facility] 10/14/2022, 8:34 AM M-F: 7am - 4pm

## 2022-10-14 NOTE — Care Management Important Message (Signed)
Important Message  Patient Details  Name: Beth Stanley MRN: [id number] Date of Birth: 11/02/1950   Important Message Given:  Yes - Medicare IM     Johnell Comings 10/14/2022, 11:36 AM

## 2022-10-14 NOTE — Consult Note (Signed)
[facility]) Accountable Care Organization [facility]) [facility] Liaison Note  10/14/2022  Beth Stanley 1950-10-08 [id number]  Location: [facility] Liaison met patient at bedside at [facility]  Insurance: [employer]   ANASTASHA ORTEZ is a 72 y.o. female who is a Primary Care Patient of Erskine Emery, NP [facility]). The patient was screened for  readmission hospitalization with noted medium risk score for unplanned readmission risk with 1 IP in 6 months.  The patient was assessed for potential [facility]) Care Management service needs for post hospital transition for care coordination. Review of patient's electronic medical record reveals patient was admitted for Severe Sepsis with acute organ dysfunction. Liaison met with pt and her spouse Molly Maduro) as bedside and educated on community care management services (receptive). Will make a referral for a post hospital prevention readmission follow up call for care management services.  No other request or inquires at this time.   Plan: [facility] Liaison will continue to follow progress and disposition to asess for post hospital community care coordination/management needs.  Referral request for community care coordination: Will make a referral for a nurse care coordination post hospital prevention readmission follow up call.   [facility] Care Management/Population Health does not replace or interfere with any arrangements made by the Inpatient Transition of Care team.   For questions contact:   Elliot Cousin, RN, [facility] Liaison [place]   Population Health Office Hours MTWF  8:00 am-6:00 pm [phone] mobile [phone] [Office toll free line] Office Hours are M-F 8:30 - 5 pm [email or web address]

## 2022-10-14 NOTE — Discharge Summary (Addendum)
Physician Discharge Summary   Beth Stanley  female DOB: August 12, 1950  BJY:[id number]  PCP: Erskine Emery, NP  Admit date: 10/11/2022 Discharge date: 10/14/2022  Admitted From: home Disposition:  home Husband updated at bedside prior to discharge.  CODE STATUS: Full code  Discharge Instructions     Discharge instructions   Complete by: As directed    You have received 4 days of IV antibiotics for your UTI, which should complete the treatment.  I have prescribed 3 more days of oral antibiotic cefadroxil to prevent infection with your nipple knot. - -   No wound care   Complete by: As directed       Hospital Course:  For full details, please see H&P, progress notes, consult notes and ancillary notes.  Briefly,  Beth Stanley is a 72 year old female with history of tobacco use, COPD, non-insulin-dependent diabetes mellitus, hypertension, diffuse large B-cell lymphoma stage IV status post autologous stem cell transplant in 2018 and wbc + platelet engraftment in October 2018, chemotherapy and radiation at [facility], who presented to ED for chief concerns of hypotension and weakness.    * Septic shock --tachycardia, leukocytosis, source UTI. --Pt was hypotensive on admission, BP didn't improve with IVF boluses, pt was therefore transferred to [facility] for levo gtt.  Pt was also started on midodrine 10 mg TID.  Levo gtt tapered off on 9/29, and pt was transferred back to hospitalist service on 9/30.  BP improved.  Pt was tapered off midodrine.   UTI (urinary tract infection) --patient was diagnosed with UTI outpatient on 9/24 and was prescribed Keflex.  Patient reports that she was compliant with Keflex however a couple of days later, she developed nausea and every time she tried to take the Keflex she would start to vomit. --started on vanc/cefepime/flagyl on presentation, de-escalated to ceftriaxone. --urine cx was not collected on presentation. --pt completed empiric  4-day of abx with ceftriaxone.  Dysuria resolved.   Hypotension --presume due to septic shock.  Off pressor and tapered off midodrine. --home Ramipril d/c'ed.   AKI secondary to ATN  --Cr 1.83 on presentation.  Improved to 0.88 the next day after IVF.   Centrilobular emphysema (HCC) --stable   Hypokalemia --monitored and supplemented PRN   Muscular abdominal pain in right flank Suspect secondary to trauma, pain from her fall at home.   Tobacco dependence Patient endorses readiness to stop tobacco use. Tobacco cessation counseling provided by admitting physician.   Chronic prescription opiate use - subxone Rx'd by Courtney Paris, NP at [facility] Suboxone 8-2 sublingual, patient takes this daily instead of the prescribed 3 times daily. --cont Suboxone 1 tab daily   Diabetes mellitus type 2, noninsulin dependent (HCC) --A1c 6.6, well controlled.   Chronic dCHF Echo on 09/30/19 with EF 55-60%, G1DD Does not appear to be in acute exacerbation at this time   Elevated SGOT (AST) Mild, not significant   Hx of Hypertension --has been hypotensive --home Ramipril d/c'ed.  Small knot in the right nipple  --pt noted and reported this on the day of discharge.  There was no expressible drainage from this. There were no overlaying skin changes.  GenSurg consulted who did not think I/D was necessary, and rec empiric abx.  Pt already received 4 days of broad-spectrum abx during hospitalization, and was discharged on 3 more days of cefadroxil.   Unless noted above, medications under "STOP" list are ones pt was not taking PTA.  Discharge Diagnoses:  Principal Problem:   Severe sepsis with acute organ dysfunction (HCC) Active Problems:   Centrilobular emphysema (HCC)   AKI (acute kidney injury) (HCC)   Hypotension   UTI (urinary tract infection)   Hypokalemia   Tobacco dependence   Muscular abdominal pain in right flank   Chronic prescription opiate use - subxone Rx'd  by Courtney Paris, NP at [facility]   GERD (gastroesophageal reflux disease)   Hyperlipidemia   Hypertension   Diffuse large B-cell lymphoma of lymph nodes of head (HCC)   History of autologous stem cell transplant (HCC)   Elevated SGOT (AST)   Heart failure with preserved ejection fraction (HCC)   Diabetes mellitus type 2, noninsulin dependent (HCC)   30 Day Unplanned Readmission Risk Score    Flowsheet Row ED to Hosp-Admission (Current) from 10/11/2022 in [facility]  30 Day Unplanned Readmission Risk Score (%) 17.11 Filed at 10/14/2022 0801       This score is the patient's risk of an unplanned readmission within 30 days of being discharged (0 -100%). The score is based on dignosis, age, lab data, medications, orders, and past utilization.   Low:  0-14.9   Medium: 15-21.9   High: 22-29.9   Extreme: 30 and above         Discharge Instructions:  Allergies as of 10/14/2022       Reactions   Aspirin    Stomach cramp Other Reaction(s): abdominal pain, Not available   Latex Swelling   Other Reaction(s): Angioedema, Not available   Other    Tape Rash   Makes Whelps on Skin Makes Whelps on Skin   Wound Dressing Adhesive Rash   Makes Whelps on Skin, Prefers paper tape        Medication List     STOP taking these medications    cephALEXin 500 MG capsule Commonly known as: KEFLEX   estradiol 0.1 MG/GM vaginal cream Commonly known as: ESTRACE   methocarbamol 500 MG tablet Commonly known as: ROBAXIN   ramipril 2.5 MG capsule Commonly known as: ALTACE   Trelegy Ellipta 100-62.5-25 MCG/ACT Aepb Generic drug: Fluticasone-Umeclidin-Vilant       TAKE these medications    acetaminophen 500 MG tablet Commonly known as: TYLENOL Take 1,000 mg by mouth 2 (two) times daily as needed for mild pain.   albuterol 108 (90 Base) MCG/ACT inhaler Commonly known as: VENTOLIN HFA Inhale 1 puff into the lungs every 6 (six) hours  as needed for wheezing.   b complex vitamins capsule Take 1 capsule by mouth daily.   Buprenorphine HCl-Naloxone HCl 8-2 MG Film Place 1 Film under the tongue daily. Home med and dosage. What changed:  how much to take when to take this additional instructions   carboxymethylcellulose 0.5 % Soln Commonly known as: REFRESH PLUS Place 1 drop into the left eye 2 (two) times daily.   cefadroxil 500 MG capsule Commonly known as: DURICEF Take 1 capsule (500 mg total) by mouth 2 (two) times daily for 3 days. Start taking on: October 15, 2022   Cholecalciferol 1.25 MG (50000 UT) capsule Take 1 tablet by mouth once a week.   cyanocobalamin 1000 MCG tablet Commonly known as: VITAMIN B12 Take 1,000 mcg by mouth daily.   diphenoxylate-atropine 2.5-0.025 MG tablet Commonly known as: LOMOTIL Take 1 tablet by mouth daily as needed for diarrhea or loose stools. Home med. What changed:  when to take this reasons to take this additional instructions  docusate sodium 100 MG capsule Commonly known as: COLACE Take 100 mg by mouth daily as needed for mild constipation.   ferrous sulfate 325 (65 FE) MG tablet Take 325 mg by mouth 3 (three) times a week. Monday. Weds, fri   Jardiance 25 MG Tabs tablet Generic drug: empagliflozin Take 25 mg by mouth daily.   lidocaine-prilocaine cream Commonly known as: EMLA Apply 1 Application topically as needed (for port access).   magnesium oxide 400 (240 Mg) MG tablet Commonly known as: MAG-OX Take 1 tablet by mouth daily.   meloxicam 7.5 MG tablet Commonly known as: MOBIC Take 7.5 mg by mouth 2 (two) times daily.   omeprazole 20 MG capsule Commonly known as: PRILOSEC Take 20 mg by mouth daily.   ondansetron 4 MG tablet Commonly known as: ZOFRAN Take 4 mg by mouth every 6 (six) hours as needed for nausea.   potassium chloride SA 20 MEQ tablet Commonly known as: KLOR-CON M Take 20 mEq by mouth daily.   sertraline 100 MG  tablet Commonly known as: ZOLOFT Take 1.5 tablets (150 mg total) by mouth daily. Home med. What changed:  how much to take how to take this when to take this additional instructions   trolamine salicylate 10 % cream Commonly known as: ASPERCREME Apply topically as needed for muscle pain.   valACYclovir 1000 MG tablet Commonly known as: VALTREX Take 1,000 mg by mouth daily.         Follow-up Information     Erskine Emery, NP. Go on 10/20/2022.   Why: Go at 11:40am. Contact information: [address] [place] [address] [phone]         Donovan Kail, PA-C Follow up.   Specialty: Physician Assistant Why: As needed, for worsening right nipple pain. Contact information: [address] [place] [address] [phone]                 Allergies  Allergen Reactions   Aspirin     Stomach cramp  Other Reaction(s): abdominal pain, Not available   Latex Swelling    Other Reaction(s): Angioedema, Not available   Other    Tape Rash    Makes Whelps on Skin Makes Whelps on Skin   Wound Dressing Adhesive Rash    Makes Whelps on Skin, Prefers paper tape     The results of significant diagnostics from this hospitalization (including imaging, microbiology, ancillary and laboratory) are listed below for reference.   Consultations:   Procedures/Studies: DG Chest Port 1 View  Result Date: 10/11/2022 CLINICAL DATA:  COPD.  Fall. EXAM: PORTABLE CHEST 1 VIEW COMPARISON:  03/04/2022 FINDINGS: Right Port-A-Cath remains in place, unchanged. Heart and mediastinal contours are within normal limits. No focal opacities or effusions. No acute bony abnormality. IMPRESSION: No active disease. Electronically Signed   By: Charlett Nose M.D.   On: 10/11/2022 19:20   CT CHEST ABDOMEN PELVIS WO CONTRAST  Result Date: 10/11/2022 CLINICAL DATA:  Weakness and fall with pain of the chest and abdomen. EXAM: CT CHEST, ABDOMEN AND PELVIS WITHOUT CONTRAST TECHNIQUE:  Multidetector CT imaging of the chest, abdomen and pelvis was performed following the standard protocol without IV contrast. RADIATION DOSE REDUCTION: This exam was performed according to the departmental dose-optimization program which includes automated exposure control, adjustment of the mA and/or kV according to patient size and/or use of iterative reconstruction technique. COMPARISON:  CT chest dated 04/19/2021 and CT chest, abdomen, and pelvis dated 11/15/2020. FINDINGS: CT CHEST FINDINGS  Cardiovascular: A right internal jugular approach port catheter tip terminates at the superior cavoatrial junction. Vascular calcifications are seen in the thoracic aorta. Normal heart size. No pericardial effusion. Mediastinum/Nodes: No enlarged mediastinal, hilar, or axillary lymph nodes. Thyroid gland, trachea, and esophagus demonstrate no significant findings. Lungs/Pleura: Biapical pleural-parenchymal scarring and calcification appears unchanged. Centrilobular and paraseptal emphysema is redemonstrated. No pleural effusion or pneumothorax. Musculoskeletal: Degenerative changes are seen in the spine. CT ABDOMEN PELVIS FINDINGS Hepatobiliary: No focal liver abnormality is seen. No gallstones, gallbladder wall thickening, or biliary dilatation. Pancreas: Unremarkable. No pancreatic ductal dilatation or surrounding inflammatory changes. Spleen: Normal in size without focal abnormality. Adrenals/Urinary Tract: Adrenal glands are unremarkable. Kidneys are normal, without renal calculi, focal lesion, or hydronephrosis. Bladder is unremarkable. Stomach/Bowel: Stomach is within normal limits. Appendix appears normal. No evidence of bowel wall thickening, distention, or inflammatory changes. Vascular/Lymphatic: Aortic atherosclerosis. No enlarged abdominal or pelvic lymph nodes. Reproductive: Status post hysterectomy. No adnexal masses. Other: No abdominal wall hernia or abnormality. No abdominopelvic ascites. Musculoskeletal:  There is 3 mm anterolisthesis of L4 on L5 is unchanged. No acute fracture is identified. IMPRESSION: No acute findings in the chest, abdomen, or pelvis. Aortic Atherosclerosis (ICD10-I70.0) and Emphysema (ICD10-J43.9). Electronically Signed   By: Romona Curls M.D.   On: 10/11/2022 15:11      Labs: BNP (last 3 results) No results for input(s): "BNP" in the last 8760 hours. Basic Metabolic Panel: Recent Labs  Lab 10/11/22 1248 10/11/22 1322 10/12/22 0406 10/14/22 0526  NA 132*  --  137 139  K 3.2*  --  4.1 3.6  CL 98  --  110 109  CO2 19*  --  17* 24  GLUCOSE 181*  --  192* 110*  BUN 56*  --  31* 20  CREATININE 1.83*  --  0.88 0.85  CALCIUM 9.3  --  8.0* 8.5*  MG  --  2.5*  --  2.1   Liver Function Tests: Recent Labs  Lab 10/11/22 1322  AST 45*  ALT 25  ALKPHOS 65  BILITOT 0.7  PROT 7.8  ALBUMIN 4.3   Recent Labs  Lab 10/11/22 1322  LIPASE 44   No results for input(s): "AMMONIA" in the last 168 hours. CBC: Recent Labs  Lab 10/11/22 1248 10/12/22 0540 10/14/22 0526  WBC 13.2* 7.1 6.7  HGB 14.6 11.0* 10.6*  HCT 45.0 32.3* 32.1*  MCV 91.8 89.2 91.7  PLT 210 192 143*   Cardiac Enzymes: No results for input(s): "CKTOTAL", "CKMB", "CKMBINDEX", "TROPONINI" in the last 168 hours. BNP: Invalid input(s): "POCBNP" CBG: Recent Labs  Lab 10/12/22 2138 10/13/22 0739 10/13/22 1127 10/13/22 1635 10/13/22 2130  GLUCAP 171* 96 126* 144* 162*   D-Dimer No results for input(s): "DDIMER" in the last 72 hours. Hgb A1c Recent Labs    10/12/22 0406  HGBA1C 6.6*   Lipid Profile No results for input(s): "CHOL", "HDL", "LDLCALC", "TRIG", "CHOLHDL", "LDLDIRECT" in the last 72 hours. Thyroid function studies Recent Labs    10/11/22 1639 10/12/22 0406  TSH 5.039*  --   T3FREE  --  2.6   Anemia work up No results for input(s): "VITAMINB12", "FOLATE", "FERRITIN", "TIBC", "IRON", "RETICCTPCT" in the last 72 hours. Urinalysis    Component Value Date/Time    COLORURINE YELLOW (A) 10/12/2022 1044   APPEARANCEUR HAZY (A) 10/12/2022 1044   APPEARANCEUR Clear 06/05/2016 1003   LABSPEC 1.013 10/12/2022 1044   PHURINE 5.0 10/12/2022 1044   GLUCOSEU >=500 (A) 10/12/2022 1044  HGBUR NEGATIVE 10/12/2022 1044   BILIRUBINUR NEGATIVE 10/12/2022 1044   BILIRUBINUR negative 02/16/2017 1628   BILIRUBINUR Negative 06/05/2016 1003   KETONESUR NEGATIVE 10/12/2022 1044   PROTEINUR NEGATIVE 10/12/2022 1044   UROBILINOGEN 0.2 02/16/2017 1628   UROBILINOGEN 0.2 01/24/2010 1052   NITRITE NEGATIVE 10/12/2022 1044   LEUKOCYTESUR MODERATE (A) 10/12/2022 1044   Sepsis Labs Recent Labs  Lab 10/11/22 1248 10/12/22 0540 10/14/22 0526  WBC 13.2* 7.1 6.7   Microbiology Recent Results (from the past 240 hour(s))  Resp panel by RT-PCR (RSV, Flu A&B, Covid) Anterior Nasal Swab     Status: None   Collection Time: 10/11/22  1:24 PM   Specimen: Anterior Nasal Swab  Result Value Ref Range Status   SARS Coronavirus 2 by RT PCR NEGATIVE NEGATIVE Final    Comment: (NOTE) SARS-CoV-2 target nucleic acids are NOT DETECTED.  The SARS-CoV-2 RNA is generally detectable in upper respiratory specimens during the acute phase of infection. The lowest concentration of SARS-CoV-2 viral copies this assay can detect is 138 copies/mL. A negative result does not preclude SARS-Cov-2 infection and should not be used as the sole basis for treatment or other patient management decisions. A negative result may occur with  improper specimen collection/handling, submission of specimen other than nasopharyngeal swab, presence of viral mutation(s) within the areas targeted by this assay, and inadequate number of viral copies(<138 copies/mL). A negative result must be combined with clinical observations, patient history, and epidemiological information. The expected result is Negative.  Fact Sheet for Patients:  BloggerCourse.com  Fact Sheet for Healthcare  Providers:  SeriousBroker.it  This test is no t yet approved or cleared by the [place] FDA and  has been authorized for detection and/or diagnosis of SARS-CoV-2 by FDA under an Emergency Use Authorization (EUA). This EUA will remain  in effect (meaning this test can be used) for the duration of the COVID-19 declaration under Section 564(b)(1) of the Act, 21 U.S.C.section 360bbb-3(b)(1), unless the authorization is terminated  or revoked sooner.       Influenza A by PCR NEGATIVE NEGATIVE Final   Influenza B by PCR NEGATIVE NEGATIVE Final    Comment: (NOTE) The Xpert Xpress SARS-CoV-2/FLU/RSV plus assay is intended as an aid in the diagnosis of influenza from Nasopharyngeal swab specimens and should not be used as a sole basis for treatment. Nasal washings and aspirates are unacceptable for Xpert Xpress SARS-CoV-2/FLU/RSV testing.  Fact Sheet for Patients: BloggerCourse.com  Fact Sheet for Healthcare Providers: SeriousBroker.it  This test is not yet approved or cleared by the [place] FDA and has been authorized for detection and/or diagnosis of SARS-CoV-2 by FDA under an Emergency Use Authorization (EUA). This EUA will remain in effect (meaning this test can be used) for the duration of the COVID-19 declaration under Section 564(b)(1) of the Act, 21 U.S.C. section 360bbb-3(b)(1), unless the authorization is terminated or revoked.     Resp Syncytial Virus by PCR NEGATIVE NEGATIVE Final    Comment: (NOTE) Fact Sheet for Patients: BloggerCourse.com  Fact Sheet for Healthcare Providers: SeriousBroker.it  This test is not yet approved or cleared by the [place] FDA and has been authorized for detection and/or diagnosis of SARS-CoV-2 by FDA under an Emergency Use Authorization (EUA). This EUA will remain in effect (meaning this test can be  used) for the duration of the COVID-19 declaration under Section 564(b)(1) of the Act, 21 U.S.C. section 360bbb-3(b)(1), unless the authorization is terminated or revoked.  Performed at [facility],  [address], [place], [place] [address]   Culture, blood (Routine X 2) w Reflex to ID Panel     Status: None (Preliminary result)   Collection Time: 10/11/22  4:18 PM   Specimen: BLOOD  Result Value Ref Range Status   Specimen Description BLOOD BLOOD RIGHT ARM  Final   Special Requests   Final    BOTTLES DRAWN AEROBIC AND ANAEROBIC Blood Culture adequate volume   Culture   Final    NO GROWTH 3 DAYS Performed at [facility], [address] [place], [place] [address]    Report Status PENDING  Incomplete  Culture, blood (Routine X 2) w Reflex to ID Panel     Status: None (Preliminary result)   Collection Time: 10/11/22  4:18 PM   Specimen: BLOOD  Result Value Ref Range Status   Specimen Description BLOOD BLOOD RIGHT FOREARM  Final   Special Requests   Final    BOTTLES DRAWN AEROBIC AND ANAEROBIC Blood Culture results may not be optimal due to an inadequate volume of blood received in culture bottles   Culture   Final    NO GROWTH 3 DAYS Performed at [facility], [address] [place], [place] [address]    Report Status PENDING  Incomplete  SARS Coronavirus 2 by RT PCR (hospital order, performed in [facility] hospital lab) *cepheid single result test* Anterior Nasal Swab     Status: None   Collection Time: 10/12/22  4:06 AM   Specimen: Anterior Nasal Swab  Result Value Ref Range Status   SARS Coronavirus 2 by RT PCR NEGATIVE NEGATIVE Final    Comment: (NOTE) SARS-CoV-2 target nucleic acids are NOT DETECTED.  The SARS-CoV-2 RNA is generally detectable in upper and lower respiratory specimens during the acute phase of infection. The lowest concentration of SARS-CoV-2 viral copies this assay can detect is 250 copies / mL. A negative result does  not preclude SARS-CoV-2 infection and should not be used as the sole basis for treatment or other patient management decisions.  A negative result may occur with improper specimen collection / handling, submission of specimen other than nasopharyngeal swab, presence of viral mutation(s) within the areas targeted by this assay, and inadequate number of viral copies (<250 copies / mL). A negative result must be combined with clinical observations, patient history, and epidemiological information.  Fact Sheet for Patients:   RoadLapTop.co.za  Fact Sheet for Healthcare Providers: http://kim-miller.com/  This test is not yet approved or  cleared by the [place] FDA and has been authorized for detection and/or diagnosis of SARS-CoV-2 by FDA under an Emergency Use Authorization (EUA).  This EUA will remain in effect (meaning this test can be used) for the duration of the COVID-19 declaration under Section 564(b)(1) of the Act, 21 U.S.C. section 360bbb-3(b)(1), unless the authorization is terminated or revoked sooner.  Performed at [facility], [address] [place], [place] [address]   MRSA Next Gen by PCR, Nasal     Status: None   Collection Time: 10/12/22  8:00 AM   Specimen: Nasal Mucosa; Nasal Swab  Result Value Ref Range Status   MRSA by PCR Next Gen NOT DETECTED NOT DETECTED Final    Comment: (NOTE) The GeneXpert MRSA Assay (FDA approved for NASAL specimens only), is one component of a comprehensive MRSA colonization surveillance program. It is not intended to diagnose MRSA infection nor to guide or monitor treatment for MRSA infections. Test performance is not FDA approved in  patients less than 32 years old. Performed at [facility], [address] [place], [place] [address]      Total time spend on discharging this patient, including the last patient exam, discussing the hospital stay, instructions  for ongoing care as it relates to all pertinent caregivers, as well as preparing the medical discharge records, prescriptions, and/or referrals as applicable, is 50 minutes.    Darlin Priestly, MD  [facility] 10/14/2022, 11:39 AM

## 2022-10-14 NOTE — Progress Notes (Signed)
Mobility Specialist - Progress Note   10/14/22 1200  Mobility  Activity Ambulated with assistance in room;Ambulated with assistance to bathroom  Level of Assistance Standby assist, set-up cues, supervision of patient - no hands on  Assistive Device Front wheel walker  Distance Ambulated (ft) 35 ft  Activity Response Tolerated well  $Mobility charge 1 Mobility     Author responded to chair alarm. Pt ambulating to bathroom on arrival. VC to stay close/inside RW. Incontinent urinal episode during ambulation. Floors cleaned and pt assisted with hygiene tasks. Pt returned to chair with alarm set, needs in reach.    Filiberto Pinks Mobility Specialist 10/14/22, 12:50 PM

## 2022-10-16 ENCOUNTER — Telehealth: Payer: Self-pay | Admitting: *Deleted

## 2022-10-16 LAB — CULTURE, BLOOD (ROUTINE X 2)
Culture: NO GROWTH
Culture: NO GROWTH
Special Requests: ADEQUATE

## 2022-10-16 NOTE — Progress Notes (Signed)
Care Coordination   Note   10/16/2022 Name: Beth Stanley MRN: [id number] DOB: [birth year]  Beth Stanley is a 72 y.o. year old female who sees Erskine Emery, NP for primary care. I reached out to Beth Stanley by phone today to offer care coordination services.  Beth Stanley was given information about Care Coordination services today including:   The Care Coordination services include support from the care team which includes your Nurse Coordinator, Clinical Social Worker, or Pharmacist.  The Care Coordination team is here to help remove barriers to the health concerns and goals most important to you. Care Coordination services are voluntary, and the patient may decline or stop services at any time by request to their care team member.   Care Coordination Consent Status: Patient agreed to services and verbal consent obtained.   Follow up plan:  Telephone appointment with care coordination team member scheduled for:  10/29/2022  Encounter Outcome:  Patient Scheduled from referral   Burman Nieves, [facility] Care Coordination Care Guide Direct Dial: [phone]

## 2022-10-17 ENCOUNTER — Other Ambulatory Visit (HOSPITAL_COMMUNITY): Payer: Self-pay | Admitting: Student

## 2022-10-17 DIAGNOSIS — Z87891 Personal history of nicotine dependence: Secondary | ICD-10-CM

## 2022-10-17 DIAGNOSIS — N390 Urinary tract infection, site not specified: Secondary | ICD-10-CM | POA: Diagnosis not present

## 2022-10-20 DIAGNOSIS — W19XXXA Unspecified fall, initial encounter: Secondary | ICD-10-CM | POA: Diagnosis not present

## 2022-10-20 DIAGNOSIS — E119 Type 2 diabetes mellitus without complications: Secondary | ICD-10-CM | POA: Diagnosis not present

## 2022-10-20 DIAGNOSIS — F1120 Opioid dependence, uncomplicated: Secondary | ICD-10-CM | POA: Diagnosis not present

## 2022-10-20 DIAGNOSIS — Z720 Tobacco use: Secondary | ICD-10-CM | POA: Diagnosis not present

## 2022-10-20 DIAGNOSIS — Z79899 Other long term (current) drug therapy: Secondary | ICD-10-CM | POA: Diagnosis not present

## 2022-10-20 DIAGNOSIS — Z0001 Encounter for general adult medical examination with abnormal findings: Secondary | ICD-10-CM | POA: Diagnosis not present

## 2022-10-20 DIAGNOSIS — R768 Other specified abnormal immunological findings in serum: Secondary | ICD-10-CM | POA: Diagnosis not present

## 2022-10-20 DIAGNOSIS — C8590 Non-Hodgkin lymphoma, unspecified, unspecified site: Secondary | ICD-10-CM | POA: Diagnosis not present

## 2022-10-20 DIAGNOSIS — Z9181 History of falling: Secondary | ICD-10-CM | POA: Diagnosis not present

## 2022-10-20 DIAGNOSIS — Z6823 Body mass index (BMI) 23.0-23.9, adult: Secondary | ICD-10-CM | POA: Diagnosis not present

## 2022-10-20 DIAGNOSIS — Z09 Encounter for follow-up examination after completed treatment for conditions other than malignant neoplasm: Secondary | ICD-10-CM | POA: Diagnosis not present

## 2022-10-20 DIAGNOSIS — Z789 Other specified health status: Secondary | ICD-10-CM | POA: Diagnosis not present

## 2022-10-20 DIAGNOSIS — E559 Vitamin D deficiency, unspecified: Secondary | ICD-10-CM | POA: Diagnosis not present

## 2022-10-20 DIAGNOSIS — J449 Chronic obstructive pulmonary disease, unspecified: Secondary | ICD-10-CM | POA: Diagnosis not present

## 2022-10-20 DIAGNOSIS — Z1331 Encounter for screening for depression: Secondary | ICD-10-CM | POA: Diagnosis not present

## 2022-10-22 DIAGNOSIS — Z79899 Other long term (current) drug therapy: Secondary | ICD-10-CM | POA: Diagnosis not present

## 2022-10-24 ENCOUNTER — Ambulatory Visit
Admission: RE | Admit: 2022-10-24 | Discharge: 2022-10-24 | Disposition: A | Discharge status: Home / Self Care | Payer: PPO | Source: Ambulatory Visit | Attending: Student | Admitting: Student

## 2022-10-24 DIAGNOSIS — Z87891 Personal history of nicotine dependence: Secondary | ICD-10-CM | POA: Diagnosis not present

## 2022-10-24 DIAGNOSIS — F17210 Nicotine dependence, cigarettes, uncomplicated: Secondary | ICD-10-CM | POA: Diagnosis not present

## 2022-10-28 DIAGNOSIS — R195 Other fecal abnormalities: Secondary | ICD-10-CM | POA: Diagnosis not present

## 2022-10-29 ENCOUNTER — Ambulatory Visit: Payer: Self-pay

## 2022-10-29 NOTE — Patient Outreach (Signed)
Care Coordination   10/29/2022 Name: CORALINE BEECHLER MRN: [id number] DOB: 10/27/1950   Care Coordination Outreach Attempts:  An unsuccessful telephone outreach was attempted for a scheduled appointment today.  Many attempts to reach patient today. I spoke with patient and she stated she could not talk. Request a call back at 230pm.  I called patient back without any answer.   Follow Up Plan:  Additional outreach attempts will be made to offer the patient care coordination information and services.   Encounter Outcome:  No Answer   Care Coordination Interventions:  No, not indicated    Lonia Chimera, RN, BSN, CEN [facility] [employer] [facility] [phone]

## 2022-11-03 ENCOUNTER — Telehealth: Payer: Self-pay | Admitting: *Deleted

## 2022-11-03 NOTE — Progress Notes (Unsigned)
Care Coordination Note  11/03/2022 Name: Beth Stanley MRN: [id number] DOB: 1950/05/07  Beth Stanley is a 72 y.o. year old female who is a primary care patient of Erskine Emery, NP and is actively engaged with the care management team. I reached out to Beth Stanley by phone today to assist with re-scheduling an initial visit with the RN Case Manager  Follow up plan: Unsuccessful telephone outreach attempt made. A HIPAA compliant phone message was left for the patient providing contact information and requesting a return call.   Burman Nieves, CCMA Care Coordination Care Guide Direct Dial: [phone]

## 2022-11-04 NOTE — Progress Notes (Signed)
Care Coordination Note  11/04/2022 Name: ARCHIE BRANCACCIO MRN: [id number] DOB: [birth year]  Alyson Reedy is a 72 y.o. year old female who is a primary care patient of Erskine Emery, NP and is actively engaged with the care management team. I reached out to Alyson Reedy by phone today to assist with re-scheduling an initial visit with the RN Case Manager  Follow up plan: Telephone appointment with care management team member scheduled for: 11/11/2022  Burman Nieves, [facility] Care Coordination Care Guide Direct Dial: [phone]

## 2022-11-10 DIAGNOSIS — N390 Urinary tract infection, site not specified: Secondary | ICD-10-CM | POA: Diagnosis not present

## 2022-11-11 ENCOUNTER — Ambulatory Visit: Payer: Self-pay

## 2022-11-11 DIAGNOSIS — R8290 Unspecified abnormal findings in urine: Secondary | ICD-10-CM | POA: Diagnosis not present

## 2022-11-11 DIAGNOSIS — Z87440 Personal history of urinary (tract) infections: Secondary | ICD-10-CM | POA: Diagnosis not present

## 2022-11-11 DIAGNOSIS — E119 Type 2 diabetes mellitus without complications: Secondary | ICD-10-CM | POA: Diagnosis not present

## 2022-11-11 DIAGNOSIS — M545 Low back pain, unspecified: Secondary | ICD-10-CM | POA: Diagnosis not present

## 2022-11-11 DIAGNOSIS — R110 Nausea: Secondary | ICD-10-CM | POA: Diagnosis not present

## 2022-11-11 DIAGNOSIS — R300 Dysuria: Secondary | ICD-10-CM | POA: Diagnosis not present

## 2022-11-11 DIAGNOSIS — N3941 Urge incontinence: Secondary | ICD-10-CM | POA: Diagnosis not present

## 2022-11-11 NOTE — Patient Outreach (Signed)
Care Coordination   11/11/2022 Name: Beth Stanley MRN: [id number] DOB: 1950-11-26   Care Coordination Outreach Attempts:  A second unsuccessful outreach was attempted today to offer the patient with information about available care coordination services.  Follow Up Plan:  Additional outreach attempts will be made to offer the patient care coordination information and services.   Encounter Outcome:  Patient Request to Call Back   Care Coordination Interventions:  No, not indicated    Lexianna Weinrich Idelle Jo, RN, MSN [facility]  [facility], [facility] Management Community Coordinator Direct Dial: [phone]  Fax: [phone] Website: [email or web address]

## 2022-11-13 DIAGNOSIS — N952 Postmenopausal atrophic vaginitis: Secondary | ICD-10-CM | POA: Diagnosis not present

## 2022-11-13 DIAGNOSIS — R319 Hematuria, unspecified: Secondary | ICD-10-CM | POA: Diagnosis not present

## 2022-11-13 DIAGNOSIS — N390 Urinary tract infection, site not specified: Secondary | ICD-10-CM | POA: Diagnosis not present

## 2022-11-13 DIAGNOSIS — N3281 Overactive bladder: Secondary | ICD-10-CM | POA: Diagnosis not present

## 2022-11-17 DIAGNOSIS — E559 Vitamin D deficiency, unspecified: Secondary | ICD-10-CM | POA: Diagnosis not present

## 2022-11-17 DIAGNOSIS — E119 Type 2 diabetes mellitus without complications: Secondary | ICD-10-CM | POA: Diagnosis not present

## 2022-11-17 DIAGNOSIS — Z9181 History of falling: Secondary | ICD-10-CM | POA: Diagnosis not present

## 2022-11-17 DIAGNOSIS — Z79899 Other long term (current) drug therapy: Secondary | ICD-10-CM | POA: Diagnosis not present

## 2022-11-17 DIAGNOSIS — C8590 Non-Hodgkin lymphoma, unspecified, unspecified site: Secondary | ICD-10-CM | POA: Diagnosis not present

## 2022-11-17 DIAGNOSIS — F17210 Nicotine dependence, cigarettes, uncomplicated: Secondary | ICD-10-CM | POA: Diagnosis not present

## 2022-11-17 DIAGNOSIS — R768 Other specified abnormal immunological findings in serum: Secondary | ICD-10-CM | POA: Diagnosis not present

## 2022-11-17 DIAGNOSIS — Z6822 Body mass index (BMI) 22.0-22.9, adult: Secondary | ICD-10-CM | POA: Diagnosis not present

## 2022-11-17 DIAGNOSIS — F1120 Opioid dependence, uncomplicated: Secondary | ICD-10-CM | POA: Diagnosis not present

## 2022-11-19 DIAGNOSIS — Z79899 Other long term (current) drug therapy: Secondary | ICD-10-CM | POA: Diagnosis not present

## 2022-11-24 DIAGNOSIS — E119 Type 2 diabetes mellitus without complications: Secondary | ICD-10-CM | POA: Diagnosis not present

## 2022-11-24 DIAGNOSIS — F419 Anxiety disorder, unspecified: Secondary | ICD-10-CM | POA: Diagnosis not present

## 2022-11-24 DIAGNOSIS — Z1321 Encounter for screening for nutritional disorder: Secondary | ICD-10-CM | POA: Diagnosis not present

## 2022-11-24 DIAGNOSIS — E1159 Type 2 diabetes mellitus with other circulatory complications: Secondary | ICD-10-CM | POA: Diagnosis not present

## 2022-11-24 DIAGNOSIS — I10 Essential (primary) hypertension: Secondary | ICD-10-CM | POA: Diagnosis not present

## 2022-11-24 DIAGNOSIS — Z20828 Contact with and (suspected) exposure to other viral communicable diseases: Secondary | ICD-10-CM | POA: Diagnosis not present

## 2022-11-24 DIAGNOSIS — Z13228 Encounter for screening for other metabolic disorders: Secondary | ICD-10-CM | POA: Diagnosis not present

## 2022-11-24 DIAGNOSIS — N3941 Urge incontinence: Secondary | ICD-10-CM | POA: Diagnosis not present

## 2022-11-24 DIAGNOSIS — R600 Localized edema: Secondary | ICD-10-CM | POA: Diagnosis not present

## 2022-11-25 ENCOUNTER — Telehealth: Payer: Self-pay

## 2022-11-25 NOTE — Patient Instructions (Signed)
Visit Information  Thank you for taking time to visit with me today. Please don't hesitate to contact me if I can be of assistance to you.   Following are the goals we discussed today:   Goals Addressed             This Visit's Progress    COMPLETED: Care Coordination Activities-No follow up required       Care Coordination Interventions: Advised patient to Annual Wellness exam. Discussed services and support. Assessed SDOH. Advised to discuss with primary care physician if services needed in the future.          If you are experiencing a Mental Health or Behavioral Health Crisis or need someone to talk to, please call the Suicide and Crisis Lifeline: 988   Patient verbalizes understanding of instructions and care plan provided today and agrees to view in MyChart. Active MyChart status and patient understanding of how to access instructions and care plan via MyChart confirmed with patient.     The patient has been provided with contact information for the care management team and has been advised to call with any health related questions or concerns.   Bary Leriche, RN, MSN [facility], [facility] Management Community Coordinator Direct Dial: [phone]  Fax: [phone] Website: [email or web address]

## 2022-11-25 NOTE — Patient Outreach (Addendum)
  Care Coordination   Initial Visit Note   11/25/2022 Name: Beth Stanley MRN: [id number] DOB: 1950-04-26  Beth Stanley is a 72 y.o. year old female who sees Erskine Emery, NP for primary care. I spoke with  Beth Stanley by phone today.  What matters to the patients health and wellness today?  none    Goals Addressed             This Visit's Progress    COMPLETED: Care Coordination Activities-No follow up required       Care Coordination Interventions: Advised patient to Annual Wellness exam. Discussed services and support. Assessed SDOH. Advised to discuss with primary care physician if services needed in the future.         SDOH assessments and interventions completed:  Yes  SDOH Interventions Today    Flowsheet Row Most Recent Value  SDOH Interventions   Food Insecurity Interventions Intervention Not Indicated  Housing Interventions Intervention Not Indicated  Utilities Interventions Intervention Not Indicated  Health Literacy Interventions Intervention Not Indicated        Care Coordination Interventions:  Yes, provided   Follow up plan: No further intervention required.   Encounter Outcome:  Patient Visit Completed   Bary Leriche, RN, MSN [facility]  [facility], [facility] Management Community Coordinator Direct Dial: [phone]  Fax: [phone] Website: [email or web address]

## 2022-12-02 ENCOUNTER — Other Ambulatory Visit: Payer: Self-pay | Admitting: Student

## 2022-12-02 ENCOUNTER — Ambulatory Visit
Admission: RE | Admit: 2022-12-02 | Discharge: 2022-12-02 | Disposition: A | Discharge status: Home / Self Care | Payer: PPO | Source: Ambulatory Visit | Attending: Student | Admitting: Student

## 2022-12-02 DIAGNOSIS — R9439 Abnormal result of other cardiovascular function study: Secondary | ICD-10-CM | POA: Diagnosis not present

## 2022-12-02 DIAGNOSIS — R252 Cramp and spasm: Secondary | ICD-10-CM | POA: Diagnosis not present

## 2022-12-08 DIAGNOSIS — C8330 Diffuse large B-cell lymphoma, unspecified site: Secondary | ICD-10-CM | POA: Diagnosis not present

## 2022-12-08 DIAGNOSIS — C8331 Diffuse large B-cell lymphoma, lymph nodes of head, face, and neck: Secondary | ICD-10-CM | POA: Diagnosis not present

## 2022-12-15 DIAGNOSIS — F1120 Opioid dependence, uncomplicated: Secondary | ICD-10-CM | POA: Diagnosis not present

## 2022-12-15 DIAGNOSIS — C8590 Non-Hodgkin lymphoma, unspecified, unspecified site: Secondary | ICD-10-CM | POA: Diagnosis not present

## 2022-12-15 DIAGNOSIS — Z79899 Other long term (current) drug therapy: Secondary | ICD-10-CM | POA: Diagnosis not present

## 2022-12-15 DIAGNOSIS — R768 Other specified abnormal immunological findings in serum: Secondary | ICD-10-CM | POA: Diagnosis not present

## 2022-12-15 DIAGNOSIS — E119 Type 2 diabetes mellitus without complications: Secondary | ICD-10-CM | POA: Diagnosis not present

## 2022-12-15 DIAGNOSIS — Z6823 Body mass index (BMI) 23.0-23.9, adult: Secondary | ICD-10-CM | POA: Diagnosis not present

## 2022-12-15 DIAGNOSIS — E559 Vitamin D deficiency, unspecified: Secondary | ICD-10-CM | POA: Diagnosis not present

## 2022-12-15 DIAGNOSIS — Z9181 History of falling: Secondary | ICD-10-CM | POA: Diagnosis not present

## 2022-12-18 DIAGNOSIS — Z79899 Other long term (current) drug therapy: Secondary | ICD-10-CM | POA: Diagnosis not present

## 2022-12-23 DIAGNOSIS — Z87440 Personal history of urinary (tract) infections: Secondary | ICD-10-CM | POA: Diagnosis not present

## 2022-12-23 DIAGNOSIS — R8281 Pyuria: Secondary | ICD-10-CM | POA: Diagnosis not present

## 2022-12-23 DIAGNOSIS — N3281 Overactive bladder: Secondary | ICD-10-CM | POA: Diagnosis not present

## 2022-12-23 DIAGNOSIS — B3749 Other urogenital candidiasis: Secondary | ICD-10-CM | POA: Diagnosis not present

## 2023-01-05 DIAGNOSIS — R350 Frequency of micturition: Secondary | ICD-10-CM | POA: Diagnosis not present

## 2023-01-05 DIAGNOSIS — J209 Acute bronchitis, unspecified: Secondary | ICD-10-CM | POA: Diagnosis not present

## 2023-01-05 DIAGNOSIS — R062 Wheezing: Secondary | ICD-10-CM | POA: Diagnosis not present

## 2023-01-10 ENCOUNTER — Ambulatory Visit
Admission: EM | Admit: 2023-01-10 | Discharge: 2023-01-10 | Disposition: A | Discharge status: Home / Self Care | Payer: PPO | Attending: Emergency Medicine | Admitting: Emergency Medicine

## 2023-01-10 ENCOUNTER — Encounter: Payer: Self-pay | Admitting: Emergency Medicine

## 2023-01-10 DIAGNOSIS — R0602 Shortness of breath: Secondary | ICD-10-CM

## 2023-01-10 MED ORDER — AEROCHAMBER MV MISC: 2 refills | Status: AC

## 2023-01-10 MED ORDER — DEXAMETHASONE SODIUM PHOSPHATE 10 MG/ML IJ SOLN
10.0000 mg | Freq: Once | INTRAMUSCULAR | Status: AC
  Administered 2023-01-10: 10 mg via INTRAMUSCULAR

## 2023-01-10 MED ORDER — ALBUTEROL SULFATE (2.5 MG/3ML) 0.083% IN NEBU: 2.5000 mg | INHALATION_SOLUTION | Freq: Four times a day (QID) | RESPIRATORY_TRACT | 12 refills | Status: AC | PRN

## 2023-01-10 MED ORDER — ALBUTEROL SULFATE (2.5 MG/3ML) 0.083% IN NEBU
2.5000 mg | INHALATION_SOLUTION | Freq: Once | RESPIRATORY_TRACT | Status: AC
  Administered 2023-01-10: 2.5 mg via RESPIRATORY_TRACT

## 2023-01-10 NOTE — ED Provider Notes (Signed)
[facility]    CSN: [id number] Arrival date & time: 01/10/23  1519      History   Chief Complaint Chief Complaint  Patient presents with   Cough   Shortness of Breath    HPI Beth Stanley is a 72 y.o. female.   HPI  72 year old female with a past medical history significant for non-Hodgkin's lymphoma, multifocal pneumonia, hypertension, history of stem cell transplant, diabetes, arthritis, anxiety, and emphysema presents for evaluation of shortness of breath.  [facility] [place] 4 days ago and diagnosed with bronchitis and discharged home on amoxicillin and azithromycin, and prednisone.  She reports that she started feel short of breath when she finished her prednisone.  Past Medical History:  Diagnosis Date   Acute metabolic encephalopathy 02/20/2022   Anxiety    Arthritis    C. difficile colitis 10/01/2019   COVID-19 virus infection 09/29/2019   Diabetes (HCC)    Heartburn    History of stem cell transplant (HCC)    HTN (hypertension)    Multifocal pneumonia 04/19/2021   Non Hodgkin's lymphoma (HCC) 2004   Hx chemo tx's.     Patient Active Problem List   Diagnosis Date Noted   Severe sepsis with acute organ dysfunction (HCC) 10/11/2022   Elevated SGOT (AST) 10/11/2022   AKI (acute kidney injury) (HCC) 10/11/2022   Heart failure with preserved ejection fraction (HCC) 10/11/2022   Hypotension 10/11/2022   Diabetes mellitus type 2, noninsulin dependent (HCC) 10/11/2022   UTI (urinary tract infection) 10/11/2022   Muscular abdominal pain in right flank 10/11/2022   Sepsis secondary to UTI (HCC) 03/05/2022   Abnormal urinalysis 03/05/2022   Chronic prescription opiate use - subxone Rx'd by Courtney Paris, NP at [facility] 02/20/2022   Gait abnormality 07/22/2021   Follicular lymphoma (HCC) 07/22/2021   Centrilobular emphysema (HCC) 04/20/2021   Neoplasm of uncertain behavior of parotid gland 10/18/2020    COVID-19 virus infection 09/29/2019   Hypokalemia 09/29/2019   Impaired mobility and ADLs 08/10/2019   Diffuse large B-cell lymphoma of lymph nodes of head (HCC) 08/09/2019   Mass of left parotid gland 11/01/2018   Low back pain 01/25/2018   Unspecified lesions of oral mucosa 01/25/2018   Vaginal atrophy 07/07/2017   History of autologous stem cell transplant (HCC) 10/27/2016   Bilateral carotid artery stenosis 07/24/2016   Tobacco dependence 04/25/2014   Chronic musculoskeletal pain 03/09/2012   Lymphoma (HCC) 02/03/2012   Hyperlipidemia 02/03/2012   Hypertension 02/03/2012   GERD (gastroesophageal reflux disease) 02/03/2012   Depression 02/03/2012    Past Surgical History:  Procedure Laterality Date   TOTAL ABDOMINAL HYSTERECTOMY      OB History   No obstetric history on file.      Home Medications    Prior to Admission medications   Medication Sig Start Date End Date Taking? Authorizing Provider  albuterol (PROVENTIL) (2.5 MG/3ML) 0.083% nebulizer solution Take 3 mLs (2.5 mg total) by nebulization every 6 (six) hours as needed for wheezing or shortness of breath. 01/10/23  Yes Becky Augusta, NP  Spacer/Aero-Holding Chambers (AEROCHAMBER MV) inhaler Use as instructed 01/10/23  Yes Becky Augusta, NP  acetaminophen (TYLENOL) 500 MG tablet Take 1,000 mg by mouth 2 (two) times daily as needed for mild pain.    [provider]  albuterol (VENTOLIN HFA) 108 (90 Base) MCG/ACT inhaler Inhale 1 puff into the lungs every 6 (six) hours as needed for wheezing. 02/21/22  Marrion Coy, MD  b complex vitamins capsule Take 1 capsule by mouth daily.    [provider]  Buprenorphine HCl-Naloxone HCl 8-2 MG FILM Place 1 Film under the tongue daily. Home med and dosage. 10/14/22   Darlin Priestly, MD  carboxymethylcellulose (REFRESH PLUS) 0.5 % SOLN Place 1 drop into the left eye 2 (two) times daily. 10/19/20   [provider]  Cholecalciferol 1.25 MG (50000 UT) capsule Take 1  tablet by mouth once a week.    [provider]  cyanocobalamin (VITAMIN B12) 1000 MCG tablet Take 1,000 mcg by mouth daily.    [provider]  diphenoxylate-atropine (LOMOTIL) 2.5-0.025 MG tablet Take 1 tablet by mouth daily as needed for diarrhea or loose stools. Home med. 10/14/22   Darlin Priestly, MD  docusate sodium (COLACE) 100 MG capsule Take 100 mg by mouth daily as needed for mild constipation.    [provider]  ferrous sulfate 325 (65 FE) MG tablet Take 325 mg by mouth 3 (three) times a week. Monday. Weds, fri 02/11/21   [provider]  JARDIANCE 25 MG TABS tablet Take 25 mg by mouth daily. 08/17/20   [provider]  lidocaine-prilocaine (EMLA) cream Apply 1 Application topically as needed (for port access). 07/22/18   [provider]  magnesium oxide (MAG-OX) 400 (240 Mg) MG tablet Take 1 tablet by mouth daily. 10/01/20   [provider]  meloxicam (MOBIC) 7.5 MG tablet Take 7.5 mg by mouth 2 (two) times daily.    [provider]  omeprazole (PRILOSEC) 20 MG capsule Take 20 mg by mouth daily. 08/25/20   [provider]  ondansetron (ZOFRAN) 4 MG tablet Take 4 mg by mouth every 6 (six) hours as needed for nausea. 05/05/19   [provider]  potassium chloride SA (KLOR-CON) 20 MEQ tablet Take 20 mEq by mouth daily. 07/28/19   [provider]  sertraline (ZOLOFT) 100 MG tablet Take 1.5 tablets (150 mg total) by mouth daily. Home med. 10/14/22   Darlin Priestly, MD  trolamine salicylate (ASPERCREME) 10 % cream Apply topically as needed for muscle pain. 10/14/22   Darlin Priestly, MD  valACYclovir (VALTREX) 1000 MG tablet Take 1,000 mg by mouth daily. 08/22/20   [provider]    Family History Family History  Problem Relation Age of Onset   Diabetes Mother    Hypertension Father    Lung cancer Sister    Hypertension Brother    Lung cancer Brother    Kidney cancer Brother        removed kidney     Hypertension Brother    Lung cancer Brother    Prostate cancer Neg Hx    Bladder Cancer Neg Hx     Social History Social History   Tobacco Use   Smoking status: Every Day    Current packs/day: 0.50    Types: Cigarettes   Smokeless tobacco: Never  Vaping Use   Vaping status: Never Used  Substance Use Topics   Alcohol use: Not Currently    Alcohol/week: 0.0 standard drinks of alcohol    Comment: Wine ocass   Drug use: No     Allergies   Aspirin, Latex, Other, Tape, and Wound dressing adhesive   Review of Systems Review of Systems  Constitutional:  Negative for fever.  Respiratory:  Positive for cough, shortness of breath and wheezing.      Physical Exam Triage Vital Signs ED Triage Vitals [01/10/23 1531]  Encounter  Vitals Group     BP      Systolic BP Percentile      Diastolic BP Percentile      Pulse      Resp      Temp      Temp src      SpO2      Weight      Height      Head Circumference      Peak Flow      Pain Score 4     Pain Loc      Pain Education      Exclude from Growth Chart    No data found.  Updated Vital Signs BP 113/60 (BP Location: Left Arm)   Pulse 83   Temp 98.2 F (36.8 C) (Oral)   Resp 15   Ht 5\' 2"  (1.575 m)   Wt 138 lb 10.7 oz (62.9 kg)   SpO2 96%   BMI 25.36 kg/m   Visual Acuity Right Eye Distance:   Left Eye Distance:   Bilateral Distance:    Right Eye Near:   Left Eye Near:    Bilateral Near:     Physical Exam Vitals and nursing note reviewed.  Constitutional:      Appearance: Normal appearance. She is not ill-appearing.  HENT:     Head: Normocephalic and atraumatic.  Cardiovascular:     Rate and Rhythm: Normal rate and regular rhythm.     Pulses: Normal pulses.     Heart sounds: Normal heart sounds. No murmur heard.    No friction rub. No gallop.  Pulmonary:     Effort: Pulmonary effort is normal.     Breath sounds: Wheezing and rhonchi present. No rales.     Comments: Patient has diffuse  wheezing. Skin:    General: Skin is warm and dry.     Capillary Refill: Capillary refill takes less than 2 seconds.     Findings: No rash.  Neurological:     General: No focal deficit present.     Mental Status: She is alert and oriented to person, place, and time.      UC Treatments / Results  Labs (all labs ordered are listed, but only abnormal results are displayed) Labs Reviewed - No data to display  EKG   Radiology No results found.  Procedures Procedures (including critical care time)  Medications Ordered in UC Medications  dexamethasone (DECADRON) injection 10 mg (10 mg Intramuscular Given 01/10/23 1549)  albuterol (PROVENTIL) (2.5 MG/3ML) 0.083% nebulizer solution 2.5 mg (2.5 mg Nebulization Given 01/10/23 1549)    Initial Impression / Assessment and Plan / UC Course  I have reviewed the triage vital signs and the nursing notes.  Pertinent labs & imaging results that were available during my care of the patient were reviewed by me and considered in my medical decision making (see chart for details).   Patient is a pleasant, nontoxic-appearing 72 year old female presenting for evaluation of shortness of breath in the setting of recently being diagnosed with bronchitis and currently under active biotic therapy.  She was discharged home on dual therapy with amoxicillin and azithromycin and states that she is finished azithromycin but still has amoxicillin left.  She was also prescribed prednisone and reports that did help her breathing but after she finished the prednisone she started feel short of breath again.  She is on Advair 250/50 daily and she has been using her albuterol inhaler without a spacer.  Her last ventilation  was just prior to arrival.  She is able to speak in full sentence without dyspnea or tachypnea.  Her cardiopulmonary exam does reveal diffuse wheezing.  I advised her that she needs to get a spacer to maximize the benefit from her inhaler and I will  discharge her home with a prescription for 1.  She reports that she is also been using her nebulizer at home and that the nebulizer does improve her symptoms of shortness of breath for short period of time.  We discussed that I do not want to give her a second round of steroids as has the potential for complications and causing adrenal insufficiency.  I will give her an IM injection of Decadron here in clinic as well as a nebulizer treatment to see if it improves her shortness of breath.  Patient reports that following the breathing treatment she feels less short of breath.  Lung sounds reveal diffuse wheezing still present but greater air movement.  I will discharge patient home with a diagnosis of shortness of breath and have her finish her prescribed antibiotics and use her albuterol inhaler or her nebulizer every 4-6 hours as needed for shortness breath and wheezing.  The Decadron we gave her should last her for 3 days and help decrease her pulmonary inflammation.  She should continue her Advair as previously prescribed.  If her symptoms did not improve she should follow-up with her pulmonologist.   Final Clinical Impressions(s) / UC Diagnoses   Final diagnoses:  Shortness of breath     Discharge Instructions      Finish the antibiotics that you were previously prescribed at her last urgent care visit for treatment of your bronchitis.  Continue your daily medication, including your Advair, to help decrease pulmonary inflammation and open up your airways.  You may use of the your albuterol inhaler with a spacer, 1 to 2 puffs every 4-6 hours, or your albuterol nebulizer every 4-6 hours, as needed for shortness of breath or wheezing.  You are being treated for bronchitis which is an inflammatory process and this could cause you to feel short of breath and cough for several weeks.  However, if you feel like you are not improving you may want to follow-up with your pulmonologist.     ED  Prescriptions     Medication Sig Dispense Auth. Provider   Spacer/Aero-Holding Chambers (AEROCHAMBER MV) inhaler Use as instructed 1 each Becky Augusta, NP   albuterol (PROVENTIL) (2.5 MG/3ML) 0.083% nebulizer solution Take 3 mLs (2.5 mg total) by nebulization every 6 (six) hours as needed for wheezing or shortness of breath. 75 mL Becky Augusta, NP      PDMP not reviewed this encounter.   Becky Augusta, NP 01/10/23 1610

## 2023-01-10 NOTE — ED Triage Notes (Signed)
Patient was prescribed Z-Pack and Amoxicillin at the [facility] in [place] about 4 days ago.  Patient states that she is still on her antibiotic.  Patient states that she finished her steroid but started feeling SOB.

## 2023-01-10 NOTE — Discharge Instructions (Addendum)
Finish the antibiotics that you were previously prescribed at her last urgent care visit for treatment of your bronchitis.  Continue your daily medication, including your Advair, to help decrease pulmonary inflammation and open up your airways.  You may use of the your albuterol inhaler with a spacer, 1 to 2 puffs every 4-6 hours, or your albuterol nebulizer every 4-6 hours, as needed for shortness of breath or wheezing.  You are being treated for bronchitis which is an inflammatory process and this could cause you to feel short of breath and cough for several weeks.  However, if you feel like you are not improving you may want to follow-up with your pulmonologist.

## 2023-01-12 DIAGNOSIS — J019 Acute sinusitis, unspecified: Secondary | ICD-10-CM | POA: Diagnosis not present

## 2023-01-12 DIAGNOSIS — C8588 Other specified types of non-Hodgkin lymphoma, lymph nodes of multiple sites: Secondary | ICD-10-CM | POA: Diagnosis not present

## 2023-01-12 DIAGNOSIS — Z0001 Encounter for general adult medical examination with abnormal findings: Secondary | ICD-10-CM | POA: Diagnosis not present

## 2023-01-12 DIAGNOSIS — E559 Vitamin D deficiency, unspecified: Secondary | ICD-10-CM | POA: Diagnosis not present

## 2023-01-12 DIAGNOSIS — Z13228 Encounter for screening for other metabolic disorders: Secondary | ICD-10-CM | POA: Diagnosis not present

## 2023-01-12 DIAGNOSIS — R059 Cough, unspecified: Secondary | ICD-10-CM | POA: Diagnosis not present

## 2023-01-12 DIAGNOSIS — E119 Type 2 diabetes mellitus without complications: Secondary | ICD-10-CM | POA: Diagnosis not present

## 2023-01-12 DIAGNOSIS — F419 Anxiety disorder, unspecified: Secondary | ICD-10-CM | POA: Diagnosis not present

## 2023-01-12 DIAGNOSIS — Z1331 Encounter for screening for depression: Secondary | ICD-10-CM | POA: Diagnosis not present

## 2023-01-12 DIAGNOSIS — I10 Essential (primary) hypertension: Secondary | ICD-10-CM | POA: Diagnosis not present

## 2023-01-12 DIAGNOSIS — Z13220 Encounter for screening for lipoid disorders: Secondary | ICD-10-CM | POA: Diagnosis not present

## 2023-01-12 DIAGNOSIS — J449 Chronic obstructive pulmonary disease, unspecified: Secondary | ICD-10-CM | POA: Diagnosis not present

## 2023-01-12 DIAGNOSIS — Z1321 Encounter for screening for nutritional disorder: Secondary | ICD-10-CM | POA: Diagnosis not present

## 2023-01-12 DIAGNOSIS — Z789 Other specified health status: Secondary | ICD-10-CM | POA: Diagnosis not present

## 2023-01-13 DIAGNOSIS — E119 Type 2 diabetes mellitus without complications: Secondary | ICD-10-CM | POA: Diagnosis not present

## 2023-01-13 DIAGNOSIS — F1120 Opioid dependence, uncomplicated: Secondary | ICD-10-CM | POA: Diagnosis not present

## 2023-01-13 DIAGNOSIS — R768 Other specified abnormal immunological findings in serum: Secondary | ICD-10-CM | POA: Diagnosis not present

## 2023-01-13 DIAGNOSIS — C8590 Non-Hodgkin lymphoma, unspecified, unspecified site: Secondary | ICD-10-CM | POA: Diagnosis not present

## 2023-01-13 DIAGNOSIS — Z79899 Other long term (current) drug therapy: Secondary | ICD-10-CM | POA: Diagnosis not present

## 2023-01-13 DIAGNOSIS — Z6822 Body mass index (BMI) 22.0-22.9, adult: Secondary | ICD-10-CM | POA: Diagnosis not present

## 2023-01-19 DIAGNOSIS — Z79899 Other long term (current) drug therapy: Secondary | ICD-10-CM | POA: Diagnosis not present

## 2023-01-22 ENCOUNTER — Other Ambulatory Visit: Payer: Self-pay | Admitting: Family

## 2023-01-22 DIAGNOSIS — Z1231 Encounter for screening mammogram for malignant neoplasm of breast: Secondary | ICD-10-CM

## 2023-02-09 ENCOUNTER — Inpatient Hospital Stay: Admission: RE | Admit: 2023-02-09 | Payer: PPO | Source: Ambulatory Visit

## 2023-02-10 ENCOUNTER — Ambulatory Visit
Admission: RE | Admit: 2023-02-10 | Discharge: 2023-02-10 | Disposition: A | Discharge status: Home / Self Care | Payer: PPO | Source: Ambulatory Visit | Attending: Family | Admitting: Family

## 2023-02-10 DIAGNOSIS — Z1231 Encounter for screening mammogram for malignant neoplasm of breast: Secondary | ICD-10-CM | POA: Diagnosis present

## 2023-02-11 ENCOUNTER — Encounter: Payer: PPO | Admitting: Internal Medicine

## 2023-02-11 NOTE — Progress Notes (Deleted)
Office Visit Note  Patient: Beth Stanley             Date of Birth: 13-Jul-1950           MRN: [id number]             PCP: Erskine Emery, NP Referring: Courtney Paris, NP Visit Date: 02/11/2023 Occupation: @GUAROCC @  Subjective:  No chief complaint on file.   History of Present Illness: Beth Stanley is a 73 y.o. female ***     Activities of Daily Living:  Patient reports morning stiffness for *** {minute/hour:19697}.   Patient {ACTIONS;DENIES/REPORTS:21021675::"Denies"} nocturnal pain.  Difficulty dressing/grooming: {ACTIONS;DENIES/REPORTS:21021675::"Denies"} Difficulty climbing stairs: {ACTIONS;DENIES/REPORTS:21021675::"Denies"} Difficulty getting out of chair: {ACTIONS;DENIES/REPORTS:21021675::"Denies"} Difficulty using hands for taps, buttons, cutlery, and/or writing: {ACTIONS;DENIES/REPORTS:21021675::"Denies"}  No Rheumatology ROS completed.   PMFS History:  Patient Active Problem List   Diagnosis Date Noted   Severe sepsis with acute organ dysfunction (HCC) 10/11/2022   Elevated SGOT (AST) 10/11/2022   AKI (acute kidney injury) (HCC) 10/11/2022   Heart failure with preserved ejection fraction (HCC) 10/11/2022   Hypotension 10/11/2022   Diabetes mellitus type 2, noninsulin dependent (HCC) 10/11/2022   UTI (urinary tract infection) 10/11/2022   Muscular abdominal pain in right flank 10/11/2022   Sepsis secondary to UTI (HCC) 03/05/2022   Abnormal urinalysis 03/05/2022   Chronic prescription opiate use - subxone Rx'd by Courtney Paris, NP at [facility] 02/20/2022   Gait abnormality 07/22/2021   Follicular lymphoma (HCC) 07/22/2021   Centrilobular emphysema (HCC) 04/20/2021   Neoplasm of uncertain behavior of parotid gland 10/18/2020   COVID-19 virus infection 09/29/2019   Hypokalemia 09/29/2019   Impaired mobility and ADLs 08/10/2019   Diffuse large B-cell lymphoma of lymph nodes of head (HCC) 08/09/2019   Mass of left parotid gland 11/01/2018    Low back pain 01/25/2018   Unspecified lesions of oral mucosa 01/25/2018   Vaginal atrophy 07/07/2017   History of autologous stem cell transplant (HCC) 10/27/2016   Bilateral carotid artery stenosis 07/24/2016   Tobacco dependence 04/25/2014   Chronic musculoskeletal pain 03/09/2012   Lymphoma (HCC) 02/03/2012   Hyperlipidemia 02/03/2012   Hypertension 02/03/2012   GERD (gastroesophageal reflux disease) 02/03/2012   Depression 02/03/2012    Past Medical History:  Diagnosis Date   Acute metabolic encephalopathy 02/20/2022   Anxiety    Arthritis    C. difficile colitis 10/01/2019   COVID-19 virus infection 09/29/2019   Diabetes (HCC)    Heartburn    History of stem cell transplant (HCC)    HTN (hypertension)    Multifocal pneumonia 04/19/2021   Non Hodgkin's lymphoma (HCC) 2004   Hx chemo tx's.     Family History  Problem Relation Age of Onset   Diabetes Mother    Hypertension Father    Lung cancer Sister    Hypertension Brother    Lung cancer Brother    Kidney cancer Brother        removed kidney    Hypertension Brother    Lung cancer Brother    Prostate cancer Neg Hx    Bladder Cancer Neg Hx    Past Surgical History:  Procedure Laterality Date   TOTAL ABDOMINAL HYSTERECTOMY     Social History   Social History Narrative   Lives w/ husband   Caffeine use: Drinks 6 cups per day   Right-handed   Immunization History  Administered Date(s) Administered   Influenza,inj,Quad PF,6+ Mos 10/03/2014   Influenza-Unspecified 11/11/2013   Pneumococcal  Conjugate-13 10/03/2014   Pneumococcal Polysaccharide-23 04/14/2016   Td 06/27/2015     Objective: Vital Signs: There were no vitals taken for this visit.   Physical Exam   Musculoskeletal Exam: ***  CDAI Exam: CDAI Score: -- Patient Global: --; Provider Global: -- Swollen: --; Tender: -- Joint Exam 02/11/2023   No joint exam has been documented for this visit   There is currently no information documented  on the homunculus. Go to the Rheumatology activity and complete the homunculus joint exam.  Investigation: No additional findings.  Imaging: No results found.  Recent Labs: Lab Results  Component Value Date   WBC 6.7 10/14/2022   HGB 10.6 (L) 10/14/2022   PLT 143 (L) 10/14/2022   NA 139 10/14/2022   K 3.6 10/14/2022   CL 109 10/14/2022   CO2 24 10/14/2022   GLUCOSE 110 (H) 10/14/2022   BUN 20 10/14/2022   CREATININE 0.85 10/14/2022   BILITOT 0.7 10/11/2022   ALKPHOS 65 10/11/2022   AST 45 (H) 10/11/2022   ALT 25 10/11/2022   PROT 7.8 10/11/2022   ALBUMIN 4.3 10/11/2022   CALCIUM 8.5 (L) 10/14/2022   GFRAA >60 10/07/2019    Speciality Comments: No specialty comments available.  Procedures:  No procedures performed Allergies: Aspirin, Latex, Other, Tape, and Wound dressing adhesive   Assessment / Plan:     Visit Diagnoses: No diagnosis found.  Orders: No orders of the defined types were placed in this encounter.  No orders of the defined types were placed in this encounter.   Face-to-face time spent with patient was *** minutes. Greater than 50% of time was spent in counseling and coordination of care.  Follow-Up Instructions: No follow-ups on file.   Fuller Plan, MD  Note - This record has been created using [employer].  Chart creation errors have been sought, but may not always  have been located. Such creation errors do not reflect on  the standard of medical care.

## 2023-02-13 ENCOUNTER — Inpatient Hospital Stay
Admission: RE | Admit: 2023-02-13 | Discharge: 2023-02-13 | Disposition: A | Discharge status: Home / Self Care | Payer: Self-pay | Source: Ambulatory Visit | Attending: Family | Admitting: Family

## 2023-02-13 ENCOUNTER — Other Ambulatory Visit: Payer: Self-pay | Admitting: *Deleted

## 2023-02-13 DIAGNOSIS — Z1231 Encounter for screening mammogram for malignant neoplasm of breast: Secondary | ICD-10-CM

## 2023-04-21 ENCOUNTER — Other Ambulatory Visit: Payer: Self-pay | Admitting: Student

## 2023-04-21 DIAGNOSIS — R911 Solitary pulmonary nodule: Secondary | ICD-10-CM

## 2023-04-23 ENCOUNTER — Ambulatory Visit
Admission: RE | Admit: 2023-04-23 | Discharge: 2023-04-23 | Disposition: A | Discharge status: Home / Self Care | Source: Ambulatory Visit | Attending: Student | Admitting: Student

## 2023-04-23 DIAGNOSIS — R911 Solitary pulmonary nodule: Secondary | ICD-10-CM | POA: Insufficient documentation

## 2023-06-20 ENCOUNTER — Ambulatory Visit: Admission: EM | Admit: 2023-06-20 | Discharge: 2023-06-20 | Disposition: A | Discharge status: Home / Self Care

## 2023-06-20 DIAGNOSIS — R0789 Other chest pain: Secondary | ICD-10-CM

## 2023-06-20 DIAGNOSIS — R0602 Shortness of breath: Secondary | ICD-10-CM

## 2023-06-20 NOTE — ED Triage Notes (Signed)
 Pt husband came into UC stating pt was having shortness of breath and chest tightness. Pt never came into UC, states she would got to ED on her own and did not wants EKG or work up here.

## 2023-06-20 NOTE — Discharge Instructions (Signed)

## 2023-06-20 NOTE — ED Provider Notes (Signed)
 73 y/o female with history of lymphoma, hypertension, hyperlipidemia, emphysema, diabetes, heart failure.  Patient is being brought in by her husband today for concerns of shortness of breath that began today.  She reports having a bit of chest tightness starting yesterday.  Patient says is not chest pain is just a tightness that comes and goes.  She feels like she needs to cough.  She denies severe headaches, palpitations, leg swelling.  Has been treated for UTI recently and so so symptoms seem to have gone away.  Patient's husband came in to register as she was outside in the vehicle.  Myself and medical assistant went to the vehicle to see patient.  Her husband had gotten her into a wheelchair at that time.  We quickly checked vital signs.  Blood pressure low.  Pulse within normal limits.  Oxygen  98%. Chest clear. Heart RRR.  I explained to patient that she will need further evaluation in the emergency department for chest pain and shortness of breath especially given her complicated medical history.  Advised bringing her inside and doing an EKG and calling EMS.  Patient says if she is going to be sent to the ER she did not want to come inside and did not want to go by EMS.  She says her husband can take her to the [facility] ER and her husband says he feels comfortable doing so. Patient declined multiple times to come into the clinic since she was going to be sent to the ER.  Ddx: PE, sepsis, COPD exacerbation, coronary event, pneumonia, etc  Patient leaving in condition. Patient and husband were both spoken to about multiple serious conditions that could be causing her symptoms. Discussed the importance of going to ER now and not delaying care. Reviewed risks of doing so.   Floydene Hy, PA-C 06/21/23 [phone]

## 2023-11-09 ENCOUNTER — Other Ambulatory Visit: Payer: Self-pay

## 2023-11-09 DIAGNOSIS — Z13820 Encounter for screening for osteoporosis: Secondary | ICD-10-CM

## 2023-12-14 NOTE — Progress Notes (Signed)
 LYMPHOMA FOLLOW-UP OFFICE VISIT  Name: Beth Stanley MRN:  [id number] Date:  02/10/2022  DIAGNOSIS:  Low Grade B-cell Lymphoma with Focal Large Cell Transformation, [facility] Type s/p OP Auto SCT  DISEASE STATUS AT TRANSPLANT :  CR2  CURRENT DISEASE STATUS:  CR2  TREATMENT: High-Dose Chemotherapy (HDCT) with BEAM and autologous stem cell reinfusion (SCT) on 10/21/2016  HISTORY OF PRESENT ILLNESS:   Oncology History Overview Note  Stage IV diffuse large B-cell lymphoma    DLBCL   12/15/2001 Initial Diagnosis   Stage IV Non-Hodgkins B-cell lymphoma with lesions involving ribs, distal L femur and bone marrow involvement   12/15/2001 - 05/10/2002 Chemotherapy   R-CHOP x 6 cycles by Dr Townsend in [place], [place]   08/11/2002 -  Imaging   Imaging by PET/CT of whole body was obtained for restaging evaluation after she transferred care to [facility] 07/2002, which showed persistent disease   08/19/2002 - 01/20/2003 Chemotherapy   R-CVP x 8 cycles. Subsequent PET's negative.   06/20/2016 -  Imaging   Imaging by PET/CT was obtained for possible recurrence . 1.  Innumerable focal osseous FDG uptake throughout the axial and appendicular skeleton concerning for metastases. Findings favor lymphomatous involvement given history of bone marrow involvement (2003).  2.  Focal FDG uptake in the rectum and rectosigmoid region, questionable clinical significance. While this may represent benign physiologic uptake, underlying neoplasm can't be excluded. Recommend further evaluation by colonoscopy. 3.  No FDG avid lymph nodes or masses are identified.   07/02/2016 Biopsy   Left ileum biopsy: extensive involvement by a low grade B-cell lymphoma with focal large cell transformation.   07/24/2016 -  Chemotherapy   R-ICE salvage cycle 1   08/14/2016 -  Chemotherapy   R-ICE Salvage cycle 2   08/28/2016 -  Imaging   Imaging by PET/CT was obtained for restaging evaluation. 1.  Interval response to treatment with  resolution of disease, favor Deauville 2.  2.  New hypermetabolic activity in thickened pylorus, indeterminate. Attention on follow up imaging advised to evaluate for persistence.   10/21/2016 Transplant   PRE-TRANSPLANT EVALUATION: ASBMT risk stratification: intermediate risk ECOG/KPS: 80/1 HSCT-CI score: 5- pulmonary, diabetes, anti-depressant Preparative regimen/treatment protocol: BEAM  AUTOLOGOUS STEM CELL TRANSPLANT: Disease status at time of transplant: CR2 Mobilized using GCS-F and Plerixafor .  She collected stem cells on 9/24, 10/07/16 for a total of 6.255x10(6) CD34+ cells/kg.  Preparative regimen with BEAM 10/15/16-10/20/16 (outpatient). Infusion of stem cells - 6.26 x 10(6) on 10/21/16 (outpatient) Admitted 10/30/16 with neutropenic fever and dairrhea.   ENGRAFTMENT: WBC engraftment on 10/31/16 and platelet engraftment on 11/06/16. Last platelet transfusion given 10/30/16.   11/11/2018 - 05/05/2019 Chemotherapy   OP ONC Lymphoma RiTUXimab  / Lenalidomide Plan Provider: Ronal Landry Muscat, MD Treatment goal: Control Line of treatment: [No plan line of treatment]   09/22/2019 - 09/22/2019 Chemotherapy   OP ONC lymphoma idelalisib Plan Provider: Ronal Landry Muscat, MD Treatment goal: Control Line of treatment: [No plan line of treatment]   10/19/2020 - 11/01/2020 Radiation Therapy   30 Gy in 10 fx to C7-T4 spine   12/09/2020 - 12/17/2020 Radiation Therapy   30 Gy in 10 fx to left parotid     INTERVAL HISTORY: I saw [name] today for routine follow up.  She is accompanied by her supportive husband, [name].  She has been feeling quite well lately, enjoyed her thanksgiving with her family.  She has her typical arthritis pain in her shoulders and hips but has been  taking tylenol  arthritis as needed, as well as using a roll on pain away gel that helps.  She has no new fever, night sweats, new LAD or weight loss.  ECOG: 1.   REVIEW OF SYSTEMS: A complete review of systems was  performed with positive/negative pertinent findings listed in HPI. All other systems are negative.   PAST MEDICAL HISTORY: Past Medical History:  Diagnosis Date   Anxiety    COPD (chronic obstructive pulmonary disease) (CMD)    Depression    Diabetes mellitus (CMD)    Emphysema of lung (CMD)    GERD (gastroesophageal reflux disease)    Hypertension    Hypertension with goal to be determined    Non Hodgkin's lymphoma    (CMD)    Other malignant lymphomas of lymph nodes of multiple sites    (CMD)    Pneumonia    PONV (postoperative nausea and vomiting)    Prolonged depressive reaction    Transfusion history    Autologous stem cell transplant     PAST SURGICAL HISTORY: Past Surgical History:  Procedure Laterality Date   HYSTERECTOMY      Procedure: HYSTERECTOMY   INSERT / REPLACE / VENOUS ACCESS CATHETER     Procedure: PORTA CATH   PAROTID BIOPSY Left 11/01/2018   Procedure: PAROTID BIOPSY;  Surgeon: Fonda Laraine Muscat, MD;  Location[facility] MAIN OR;  Service: ENT;  Laterality: Left;     ALLERGIES Allergies  Allergen Reactions   Aspirin Other (See Comments)    Stomach cramp    Other Reaction(s): abdominal pain, Not available   Latex Angioedema   Adhesive Rash    Makes Whelps on Skin, Prefers paper tape   Chlorhexidine  Other (See Comments)    Pt reported burning with last administration    Surgical Tape Rash    Makes Whelps on Skin  Makes Whelps on Skin     CURRENT MEDICATIONS Current Outpatient Medications  Medication Instructions   acetaminophen  (TYLENOL ) 1,000 mg, 2 times daily PRN   acyclovir (ZOVIRAX) 400 mg tablet    albuterol  HFA (PROVENTIL  HFA;VENTOLIN  HFA;PROAIR  HFA) 90 mcg/actuation inhaler 1 puff, inhalation, Every 6 hours PRN   ascorbic acid (VITAMIN C) 500 mg, oral, 2 times daily   buprenorphine -naloxone  (SUBOXONE ) 8-2 mg per SL film 1 Film, Every morning   carboxymethylcellulose (REFRESH TEARS) 0.5 % drop ophthalmic  solution 1 drop, Left Eye, 2 times daily   chlorhexidine  (PERIDEX ) 0.12 % solution    cholecalciferol  (VITAMIN D3) 1,000 Units, Daily   clindamycin  (CLINDAGEL) 1 % gel    cyanocobalamin  (VITAMIN B12) 1,000 mcg, oral, Daily   dexamethasone  (DECADRON ) 2 mg tablet Take 1 tablet (2mg ) by mouth twice daily for 4 days then take 1 tablet (2mg ) by mouth daily for 4 days   diphenoxylate -atropine  (LOMOTIL ) 2.5-0.025 mg per tablet 1 tablet, 4 times daily   docusate sodium  (COLACE) 100 mg   ergocalciferol  (VITAMIN D2) 1,250 mcg (50,000 unit) capsule 1 capsule, Weekly   estradioL  (ESTRACE ) 0.01 % (0.1 mg/gram) vaginal cream Apply pea-sized amount to vagina 2x nightly.   estradioL  (ESTRACE ) 2 g, vaginal, Daily   ferrous sulfate  325 mg (65 mg iron) EC tablet Take 1 tablet by mouth on Monday, Wednesday, Friday.   fluticasone  propion-salmeteroL (ADVAIR DISKUS) 250-50 mcg/dose diskus inhaler 1 puff, inhalation, 2 times daily RT   gemfibroziL (LOPID) 600 mg   hydrOXYzine (ATARAX) 10 mg, oral, Every 12 hours PRN   ipratropium (ATROVENT) 21 mcg (0.03 %) nasal spray  ipratropium-albuteroL  (DUO-NEB) 0.5-2.5 mg/3 mL nebulizer solution    Jardiance  25 mg, Daily   lidocaine -prilocaine (EMLA) 2.5-2.5 % cream Place a dime sized amount over port area approximately 30 minutes prior to access.   magnesium  oxide 400 mg, Daily   meloxicam (MOBIC) 7.5 mg, oral, 2 times daily   methenamine (HIPREX) 1 gram tablet    methocarbamoL (ROBAXIN) 500 mg, oral, 2 times daily PRN   mirabegron (MYRBETRIQ) 50 mg, oral, Daily   Mucinex  DM 30-600 mg per 12 hr tablet    mucositis mouthwash-viscous lidocaine  2% 1:1 30 mL, 4 times daily PRN   nicotine  (NICODERM CQ ) 21 mg   NON FORMULARY 1 tablet   omeprazole  (PRILOSEC) 20 mg, oral, Daily   ondansetron  (ZOFRAN ) 4 mg, oral, Every 6 hours PRN   OneTouch Ultra Test test strip    OneTouch UltraSoft 2 Lancet 30 gauge misc    potassium chloride  (KLOR-CON ) 20  mEq ER tablet 20 mEq, oral, Daily   prochlorperazine  (COMPAZINE ) 10 mg tablet Take 1 tablet every 6 hrs as needed for nausea   ramipriL  (ALTACE ) 2.5 mg capsule Hold until follow up with PCP. Previous sig: take 2.5 mg once daily   sertraline  (ZOLOFT ) 150 mg, oral, Daily   valACYclovir  (VALTREX ) 1,000 mg, 2 times daily   white petrolatum-mineral oiL (Refresh P.M.) 57.3-42.5 % oint 1 Application, Left Eye, Nightly     FAMILY HISTORY Family History  Problem Relation Name Age of Onset   Diabetes Mother     Lung cancer Sister     Kidney cancer Brother     Lung cancer Brother       SOCIAL HISTORY Social History   Socioeconomic History   Marital status: Married  Tobacco Use   Smoking status: Every Day    Current packs/day: 0.50    Average packs/day: 0.9 packs/day for 57.9 years (55.0 ttl pk-yrs)    Types: Cigarettes    Start date: 1968   Smokeless tobacco: Never  Substance and Sexual Activity   Alcohol  use: No   Drug use: No   Social Drivers of Architectural Technologist Insecurity: No Food Insecurity (11/25/2022)   Received [facility]   Food vital sign    Within the past 12 months, you worried that your food would run out before you got money to buy more: Never true    Within the past 12 months, the food you bought just didn't last and you didn't have money to get more: Never true  Transportation Needs: No Transportation Needs (03/05/2022)   Received from [facility] - Transportation    Lack of Transportation (Medical): No    Lack of Transportation (Non-Medical): No  Safety: Not At Risk (03/05/2022)   Received from [facility]   Safety    Within the last year, have you been afraid of your partner or ex-partner?: No    Within the last year, have you been humiliated or emotionally abused in other ways by your partner or ex-partner?: No    Within the last year, have you been kicked, hit, slapped, or otherwise physically hurt by your partner or  ex-partner?: No    Within the last year, have you been raped or forced to have any kind of sexual activity by your partner or ex-partner?: No     Review of systems: 12 point ROS obtained and negative if not mentioned above.    PHYSICAL EXAMINATION: VITAL SIGNS:  Vitals:   12/14/23 1138  BP: ROLLEN)  130/58  BP Location: Left arm  Patient Position: Sitting  Pulse: 71  Resp: 16  Temp: 97.1 F (36.2 C)  TempSrc: Temporal  SpO2: 98%  Weight: 62.1 kg (137 lb)  Height: 1.6 m (5' 2.99)    GENERAL: Well developed, well nourished,alert and oriented in no apparent distress.  HEENT: Anicteric sclerae, EOMI.   NECK: Supple without JVD or thyromegaly.  LUNGS: wheezing bilaterally throughout  CARDIAC: Regular rate and rhythm, S1, S2, no murmurs, rubs or gallops.  ABDOMEN: Soft, non distended, non tender.  EXTREMITIES: No edema, clubbing or cyanosis. Arthritic changes of hands  NEUROLOGIC: Cranial nerves III-XII grossly symmetric and intact.    SKIN: No rash noted on exposed skin  LYMPH: No palpable lymphadenopathy.   LABORATORY RESULTS: Recent Results (from the past 600 hours)  Comprehensive Metabolic Panel   Collection Time: 12/14/23 11:06 AM  Result Value Ref Range   Sodium 138 136 - 145 mmol/L   Potassium 4.2 3.4 - 4.5 mmol/L   Chloride 106 98 - 107 mmol/L   CO2 26 21 - 31 mmol/L   Anion Gap 6 6 - 14 mmol/L   Glucose, Random 136 (H) 70 - 99 mg/dL   Blood Urea  Nitrogen (BUN) 14 7 - 25 mg/dL   Creatinine 9.16 9.39 - 1.20 mg/dL   eGFR 74 >40 fO/fpw/8.26f7   Albumin 4.3 3.5 - 5.7 g/dL   Total Protein 7.1 6.4 - 8.9 g/dL   Bilirubin, Total 0.2 (L) 0.3 - 1.0 mg/dL   Alkaline Phosphatase (ALP) 67 34 - 104 U/L   Aspartate Aminotransferase (AST) 13 13 - 39 U/L   Alanine Aminotransferase (ALT) 10 7 - 52 U/L   Calcium 9.2 8.6 - 10.3 mg/dL   BUN/Creatinine Ratio    Lactate Dehydrogenase (LDH)   Collection Time: 12/14/23 11:06 AM  Result Value Ref Range   Lactate Dehydrogenase (LDH) 165  140 - 271 U/L  CBC with Differential   Collection Time: 12/14/23 11:06 AM  Result Value Ref Range   WBC 6.80 4.40 - 11.00 10*3/uL   RBC 3.71 (L) 4.10 - 5.10 10*6/uL   Hemoglobin 11.7 (L) 12.3 - 15.3 g/dL   Hematocrit 66.1 (L) 64.0 - 44.6 %   Mean Corpuscular Volume (MCV) 91.3 80.0 - 96.0 fL   Mean Corpuscular Hemoglobin (MCH) 31.5 27.5 - 33.2 pg   Mean Corpuscular Hemoglobin Conc (MCHC) 34.5 33.0 - 37.0 g/dL   Red Cell Distribution Width (RDW) 15.1 12.3 - 17.0 %   Platelet Count (PLT) 171 150 - 450 10*3/uL   Mean Platelet Volume (MPV) 7.6 6.8 - 10.2 fL   Neutrophils % 67 %   Lymphocytes % 21 %   Monocytes % 8 %   Eosinophils % 2 %   Basophils % 1 %   nRBC % 0 %   Neutrophils Absolute 4.60 1.80 - 7.80 10*3/uL   Lymphocytes # 1.50 1.00 - 4.80 10*3/uL   Monocytes # 0.60 0.00 - 0.80 10*3/uL   Eosinophils # 0.10 0.00 - 0.50 10*3/uL   Basophils # 0.10 0.00 - 0.20 10*3/uL   nRBC Absolute 0.00 <=0.00 10*3/uL     RADIOLOGY RESULTS: No new imaging  ASSESSMENT/PLAN:   1. Recurrent follicular lymphoma:   -PET scan from 09/2020 showed a new left parotid mass as well as a mass in the left perivertebral space at T2-T3 -She completed radiation to C7-T4 on 11/01/20. -Completed XRT to the left parotid on 12/17/20. -Imaging since then has shown continued response (last imaging 07/2023) -  labs reviewed today are stable  -she is thankfully without any new issue or concern today -RTC in 6 months, sooner with new issue  2. Arthritis  -has arthritic changes of her finger joints -benefits from Meloxicam -recently saw a rheumatologist, issues are thought to be osteoarthritis and recommend she continue tylenol  and/or meloxicam -pain is stable today  Follow up:  -6 months labs and md visit  Electronically signed by:  Harlene Reeves Minerva, PA-C, 12/14/2023 1:53 PM

## 2023-12-16 NOTE — Telephone Encounter (Signed)
 Returned call to patient, she appreciated call.

## 2023-12-21 ENCOUNTER — Other Ambulatory Visit

## 2024-01-13 ENCOUNTER — Observation Stay
Admission: EM | Admit: 2024-01-13 | Discharge: 2024-01-14 | Disposition: A | Discharge status: Home / Self Care | Attending: Student | Admitting: Student

## 2024-01-13 ENCOUNTER — Other Ambulatory Visit: Payer: Self-pay

## 2024-01-13 ENCOUNTER — Emergency Department

## 2024-01-13 ENCOUNTER — Observation Stay

## 2024-01-13 DIAGNOSIS — W19XXXA Unspecified fall, initial encounter: Principal | ICD-10-CM

## 2024-01-13 DIAGNOSIS — Z8616 Personal history of COVID-19: Secondary | ICD-10-CM | POA: Diagnosis not present

## 2024-01-13 DIAGNOSIS — Z91040 Latex allergy status: Secondary | ICD-10-CM | POA: Diagnosis not present

## 2024-01-13 DIAGNOSIS — M6281 Muscle weakness (generalized): Secondary | ICD-10-CM | POA: Insufficient documentation

## 2024-01-13 DIAGNOSIS — W06XXXA Fall from bed, initial encounter: Secondary | ICD-10-CM | POA: Insufficient documentation

## 2024-01-13 DIAGNOSIS — N390 Urinary tract infection, site not specified: Secondary | ICD-10-CM | POA: Diagnosis not present

## 2024-01-13 DIAGNOSIS — Z79899 Other long term (current) drug therapy: Secondary | ICD-10-CM | POA: Insufficient documentation

## 2024-01-13 DIAGNOSIS — N3000 Acute cystitis without hematuria: Secondary | ICD-10-CM | POA: Insufficient documentation

## 2024-01-13 DIAGNOSIS — E119 Type 2 diabetes mellitus without complications: Secondary | ICD-10-CM | POA: Insufficient documentation

## 2024-01-13 DIAGNOSIS — E785 Hyperlipidemia, unspecified: Secondary | ICD-10-CM | POA: Diagnosis not present

## 2024-01-13 DIAGNOSIS — C8590 Non-Hodgkin lymphoma, unspecified, unspecified site: Secondary | ICD-10-CM | POA: Diagnosis not present

## 2024-01-13 DIAGNOSIS — F17210 Nicotine dependence, cigarettes, uncomplicated: Secondary | ICD-10-CM | POA: Insufficient documentation

## 2024-01-13 DIAGNOSIS — E1165 Type 2 diabetes mellitus with hyperglycemia: Secondary | ICD-10-CM | POA: Insufficient documentation

## 2024-01-13 DIAGNOSIS — R531 Weakness: Secondary | ICD-10-CM | POA: Diagnosis present

## 2024-01-13 DIAGNOSIS — G9341 Metabolic encephalopathy: Principal | ICD-10-CM | POA: Insufficient documentation

## 2024-01-13 DIAGNOSIS — I10 Essential (primary) hypertension: Secondary | ICD-10-CM | POA: Insufficient documentation

## 2024-01-13 DIAGNOSIS — G9340 Encephalopathy, unspecified: Secondary | ICD-10-CM | POA: Diagnosis present

## 2024-01-13 DIAGNOSIS — Z7984 Long term (current) use of oral hypoglycemic drugs: Secondary | ICD-10-CM | POA: Insufficient documentation

## 2024-01-13 DIAGNOSIS — K219 Gastro-esophageal reflux disease without esophagitis: Secondary | ICD-10-CM | POA: Diagnosis not present

## 2024-01-13 LAB — MAGNESIUM: Magnesium: 2.1 mg/dL (ref 1.7–2.4)

## 2024-01-13 LAB — COMPREHENSIVE METABOLIC PANEL WITH GFR
ALT: 26 U/L (ref 0–44)
AST: 59 U/L — ABNORMAL HIGH (ref 15–41)
Albumin: 4.8 g/dL (ref 3.5–5.0)
Alkaline Phosphatase: 73 U/L (ref 38–126)
Anion gap: 13 (ref 5–15)
BUN: 13 mg/dL (ref 8–23)
CO2: 26 mmol/L (ref 22–32)
Calcium: 10.3 mg/dL (ref 8.9–10.3)
Chloride: 100 mmol/L (ref 98–111)
Creatinine, Ser: 0.98 mg/dL (ref 0.44–1.00)
GFR, Estimated: >60 mL/min
Glucose, Bld: 151 mg/dL — ABNORMAL HIGH (ref 70–99)
Potassium: 4.5 mmol/L (ref 3.5–5.1)
Sodium: 139 mmol/L (ref 135–145)
Total Bilirubin: 0.5 mg/dL (ref 0.0–1.2)
Total Protein: 8.1 g/dL (ref 6.5–8.1)

## 2024-01-13 LAB — URINALYSIS, ROUTINE W REFLEX MICROSCOPIC
Bilirubin Urine: NEGATIVE
Glucose, UA: NEGATIVE mg/dL
Ketones, ur: NEGATIVE mg/dL
Nitrite: NEGATIVE
Protein, ur: 100 mg/dL — AB
Specific Gravity, Urine: 1.023 (ref 1.005–1.030)
WBC, UA: >50 WBC/hpf (ref 0–5)
pH: 5 (ref 5.0–8.0)

## 2024-01-13 LAB — LACTIC ACID, PLASMA
Lactic Acid, Venous: 2 mmol/L (ref 0.5–1.9)
Lactic Acid, Venous: 1.3 mmol/L (ref 0.5–1.9)

## 2024-01-13 LAB — CBG MONITORING, ED: Glucose-Capillary: 100 mg/dL — ABNORMAL HIGH (ref 70–99)

## 2024-01-13 LAB — HEMOGLOBIN A1C
Hgb A1c MFr Bld: 6.4 % — ABNORMAL HIGH (ref 4.8–5.6)
Mean Plasma Glucose: 136.98 mg/dL

## 2024-01-13 LAB — CBC
HCT: 42.3 % (ref 36.0–46.0)
Hemoglobin: 13.7 g/dL (ref 12.0–15.0)
MCH: 31.2 pg (ref 26.0–34.0)
MCHC: 32.4 g/dL (ref 30.0–36.0)
MCV: 96.4 fL (ref 80.0–100.0)
Platelets: 154 K/uL (ref 150–400)
RBC: 4.39 MIL/uL (ref 3.87–5.11)
RDW: 14.6 % (ref 11.5–15.5)
WBC: 9.4 K/uL (ref 4.0–10.5)
nRBC: 0 % (ref 0.0–0.2)

## 2024-01-13 LAB — GLUCOSE, CAPILLARY: Glucose-Capillary: 118 mg/dL — ABNORMAL HIGH (ref 70–99)

## 2024-01-13 MED ORDER — SODIUM CHLORIDE 0.9 % IV BOLUS
500.0000 mL | Freq: Once | INTRAVENOUS | Status: AC
  Administered 2024-01-13: 500 mL via INTRAVENOUS

## 2024-01-13 MED ORDER — CEPHALEXIN 500 MG PO CAPS: 500.0000 mg | ORAL_CAPSULE | Freq: Two times a day (BID) | ORAL | 0 refills | Status: DC

## 2024-01-13 MED ORDER — ACETAMINOPHEN 325 MG PO TABS
650.0000 mg | ORAL_TABLET | Freq: Four times a day (QID) | ORAL | Status: DC | PRN
  Administered 2024-01-14: 650 mg via ORAL
  Filled 2024-01-13 (×2): qty 2

## 2024-01-13 MED ORDER — HEPARIN SODIUM (PORCINE) 5000 UNIT/ML IJ SOLN
5000.0000 [IU] | Freq: Three times a day (TID) | INTRAMUSCULAR | Status: DC
  Administered 2024-01-13 – 2024-01-14 (×3): 5000 [IU] via SUBCUTANEOUS
  Filled 2024-01-13 (×4): qty 1

## 2024-01-13 MED ORDER — INSULIN ASPART 100 UNIT/ML IJ SOLN: 0.0000 [IU] | Freq: Every day | INTRAMUSCULAR | Status: DC

## 2024-01-13 MED ORDER — ONDANSETRON HCL 4 MG/2ML IJ SOLN: 4.0000 mg | Freq: Four times a day (QID) | INTRAMUSCULAR | Status: DC | PRN

## 2024-01-13 MED ORDER — ONDANSETRON HCL 4 MG PO TABS: 4.0000 mg | ORAL_TABLET | Freq: Four times a day (QID) | ORAL | Status: DC | PRN

## 2024-01-13 MED ORDER — SODIUM CHLORIDE 0.9 % IV SOLN
1.0000 g | INTRAVENOUS | Status: DC
  Administered 2024-01-13: 1 g via INTRAVENOUS
  Filled 2024-01-13 (×2): qty 10

## 2024-01-13 MED ORDER — INSULIN ASPART 100 UNIT/ML IJ SOLN
0.0000 [IU] | Freq: Three times a day (TID) | INTRAMUSCULAR | Status: DC
  Administered 2024-01-14: 1 [IU] via SUBCUTANEOUS
  Filled 2024-01-13: qty 1

## 2024-01-13 MED ORDER — ACETAMINOPHEN 650 MG RE SUPP: 650.0000 mg | Freq: Four times a day (QID) | RECTAL | Status: DC | PRN

## 2024-01-13 MED ORDER — SENNOSIDES-DOCUSATE SODIUM 8.6-50 MG PO TABS: 1.0000 | ORAL_TABLET | Freq: Every evening | ORAL | Status: DC | PRN

## 2024-01-13 MED ORDER — SODIUM CHLORIDE 0.9% FLUSH
3.0000 mL | Freq: Two times a day (BID) | INTRAVENOUS | Status: DC
  Administered 2024-01-13 – 2024-01-14 (×2): 3 mL via INTRAVENOUS

## 2024-01-13 MED ORDER — ALBUTEROL SULFATE (2.5 MG/3ML) 0.083% IN NEBU
2.5000 mg | INHALATION_SOLUTION | Freq: Four times a day (QID) | RESPIRATORY_TRACT | Status: DC | PRN
  Administered 2024-01-14: 2.5 mg via RESPIRATORY_TRACT
  Filled 2024-01-13: qty 3

## 2024-01-13 NOTE — ED Provider Notes (Addendum)
 ----------------------------------------- 3:54 PM on 01/13/2024 -----------------------------------------  Blood pressure (!) 120/57, pulse 97, temperature 98.3 F (36.8 C), temperature source Oral, resp. rate 17, height 5' 3 (1.6 m), weight 63.5 kg, SpO2 98%.  Assuming care from Jenna Poggi, PA-C.  In short, Beth Stanley is a 73 y.o. female with a chief complaint of Fall .  Refer to the original H&P for additional details.  The current plan of care is to wait for xray results and second lactic acid. If xrays negative and lactic is improving, patient would be appropriate for outpatient treatment of her UTI, if lactic is increasing, would consider admission.  ____________________________________________    ED Results / Procedures / Treatments   Labs (all labs ordered are listed, but only abnormal results are displayed) Labs Reviewed  COMPREHENSIVE METABOLIC PANEL WITH GFR - Abnormal; Notable for the following components:      Result Value   Glucose, Bld 151 (*)    AST 59 (*)    All other components within normal limits  URINALYSIS, ROUTINE W REFLEX MICROSCOPIC - Abnormal; Notable for the following components:   Color, Urine YELLOW (*)    APPearance TURBID (*)    Hgb urine dipstick MODERATE (*)    Protein, ur 100 (*)    Leukocytes,Ua MODERATE (*)    Bacteria, UA FEW (*)    All other components within normal limits  LACTIC ACID, PLASMA - Abnormal; Notable for the following components:   Lactic Acid, Venous 2.0 (*)    All other components within normal limits  CULTURE, BLOOD (ROUTINE X 2)  CULTURE, BLOOD (ROUTINE X 2)  URINE CULTURE  CBC  LACTIC ACID, PLASMA  CBG MONITORING, ED    RADIOLOGY  I personally viewed and evaluated these images as part of my medical decision making, as well as reviewing the written report by the radiologist.  ED Provider Interpretation: Both chest and pelvis xray are negative for fractures.   DG Pelvis 1-2 Views Result Date:  01/13/2024 EXAM: 1 or 2 VIEW(S) XRAY OF THE PELVIS 01/13/2024 03:00:00 PM COMPARISON: Pelvis 10/11/2022. CLINICAL HISTORY: fall FINDINGS: BONES AND JOINTS: No acute fracture. No malalignment. Degenerative changes in visualized lower lumbar spine. Moderate degenerative changes of bilateral hip joints with osteophyte formation and mild-to-moderate joint space narrowing. SOFT TISSUES: The soft tissues are unremarkable. IMPRESSION: 1. No evidence of acute traumatic injury. Electronically signed by: Morgane Naveau MD 01/13/2024 03:24 PM EST RP Workstation: [id number]   DG Chest 2 View Result Date: 01/13/2024 EXAM: 2 VIEW(S) XRAY OF THE CHEST 01/13/2024 03:00:00 PM COMPARISON: 10/11/2022 CLINICAL HISTORY: fall FINDINGS: LINES, TUBES AND DEVICES: Right Port-A-Cath in place with tip overlying right atrium. LUNGS AND PLEURA: Stable biapical pleuroparenchymal scarring. No pleural effusion. No pneumothorax. HEART AND MEDIASTINUM: No acute abnormality of the cardiac and mediastinal silhouettes. BONES AND SOFT TISSUES: Thoracic degenerative changes. No acute osseous abnormality. IMPRESSION: 1. No acute cardiopulmonary abnormality. 2. Right Port-A-Cath with tip overlying the right atrium. Electronically signed by: Morgane Naveau MD 01/13/2024 03:23 PM EST RP Workstation: [id number]     PROCEDURES:  Critical Care performed: No  Procedures   MEDICATIONS ORDERED IN ED: Medications  cefTRIAXone  (ROCEPHIN ) 1 g in sodium chloride  0.9 % 100 mL IVPB (1 g Intravenous New Bag/Given 01/13/24 1540)  sodium chloride  0.9 % bolus 500 mL (500 mLs Intravenous New Bag/Given 01/13/24 1341)  sodium chloride  0.9 % bolus 500 mL (500 mLs Intravenous New Bag/Given 01/13/24 1523)     IMPRESSION / MDM / ASSESSMENT AND PLAN /  ED COURSE  I reviewed the triage vital signs and the nursing notes.                             73 year old female presents for evaluation after a fall.   See previous providers note for differential.    Patient's presentation is most consistent with acute complicated illness / injury requiring diagnostic workup.  X-rays are both negative and lactic acid improved.  Patient's diagnosis is consistent with UTI and fall. I do not feel she is stable for discharge given her AMS, will plan to admit.  Clinical Course as of 01/13/24 1737  Wed Jan 13, 2024  1334 Husband at the bedside reports that she has been acting her normal self.  She had gotten up in the middle of the night to either use the bathroom or smoke which were both normal behavior for her. [JP]  1517 Received handoff from previous provider.  Will reassess lactic to ensure that this is improving as well as watch for results of chest and pelvis x-rays. [LD]  1525 DG Chest 2 View Negative for acute abnormalities. [LD]  1537 DG Pelvis 1-2 Views Negative for fracture. [LD]  1613 Lactic acid, plasma Lactic acid has improved after fluid administration. [LD]  1719 Patient reassessed and she is very lethargic. Patient cannot keep her eyes open long enough to have a conversation with me or her husband. I am concerned about worsening infection given her mental status even though the lactic improved with fluids. I am concerned that she would have another fall at home if I were to discharge her given her AMS. I discussed admission with patient's husband who was in agreement with plan.  [LD]  1737 Hospitalist agreeable to admit. [LD]    Clinical Course User Index [JP] Poggi, Jenna E, PA-C [LD] Cleaster Tinnie LABOR, PA-C    FINAL CLINICAL IMPRESSION(S) / ED DIAGNOSES   Final diagnoses:  Fall, initial encounter  Lower urinary tract infectious disease  Acute cystitis without hematuria     Rx / DC Orders   ED Discharge Orders          Ordered    cephALEXin  (KEFLEX ) 500 MG capsule  2 times daily        01/13/24 1621             Note:  This document was prepared using Dragon voice recognition software and may include unintentional  dictation errors.    Cleaster Tinnie LABOR, PA-C 01/13/24 1622    Cleaster Tinnie LABOR, PA-C 01/13/24 1737    Floy Roberts, MD 01/13/24 [phone]

## 2024-01-13 NOTE — Discharge Instructions (Addendum)
 The x-ray of your chest and pelvis did not show any fractures.  Your urinalysis did show evidence of UTI.  Please take the antibiotics as prescribed.  Make sure you take all of the medication even if you begin to feel better.  Return to the emergency department with any worsening symptoms like fever or back pain.

## 2024-01-13 NOTE — ED Provider Notes (Signed)
 "  [facility] Provider Note    Event Date/Time   First MD Initiated Contact with Patient 01/13/24 1200     (approximate)   History   Fall   HPI  Beth Stanley is a 73 y.o. female with a past medical history of type 2 diabetes, urinary tract infection, lymphoma currently having completed treatment who presents today for evaluation after a fall.  Patient reports that she was sitting on the side of her bed when she slid off of a landed on her buttocks.  She denies head strike or LOC.  She had trouble getting up because of pain to her tailbone, the patient reports that she fractured her tailbone several years ago and has chronic pain in this area.  She denies any new or different pain today.  She denies any radiation of pain down her legs.  She denies any back pain.  Reports that she has been feeling quite well and has not had any fevers, chills, cough, nasal congestion, weakness, or fatigue.  Her husband thought maybe she had a urinary tract infection because of her difficulty getting up.  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  Patient Active Problem List   Diagnosis Date Noted   Severe sepsis with acute organ dysfunction (HCC) 10/11/2022   Elevated SGOT (AST) 10/11/2022   AKI (acute kidney injury) 10/11/2022   Heart failure with preserved ejection fraction (HCC) 10/11/2022   Hypotension 10/11/2022   Diabetes mellitus type 2, noninsulin dependent (HCC) 10/11/2022   UTI (urinary tract infection) 10/11/2022   Muscular abdominal pain in right flank 10/11/2022   Sepsis secondary to UTI (HCC) 03/05/2022   Abnormal urinalysis 03/05/2022   Chronic prescription opiate use - subxone Rx'd by Vernell Pass, NP at [facility] 02/20/2022   Gait abnormality 07/22/2021   Follicular lymphoma (HCC) 07/22/2021   Centrilobular emphysema (HCC) 04/20/2021   Neoplasm of uncertain behavior of  parotid gland 10/18/2020   COVID-19 virus infection 09/29/2019   Hypokalemia 09/29/2019   Impaired mobility and ADLs 08/10/2019   Diffuse large B-cell lymphoma of lymph nodes of head (HCC) 08/09/2019   Mass of left parotid gland 11/01/2018   Low back pain 01/25/2018   Unspecified lesions of oral mucosa 01/25/2018   Vaginal atrophy 07/07/2017   History of autologous stem cell transplant (HCC) 10/27/2016   Bilateral carotid artery stenosis 07/24/2016   Tobacco dependence 04/25/2014   Chronic musculoskeletal pain 03/09/2012   Lymphoma (HCC) 02/03/2012   Hyperlipidemia 02/03/2012   Hypertension 02/03/2012   GERD (gastroesophageal reflux disease) 02/03/2012   Depression 02/03/2012          Physical Exam   Triage Vital Signs: ED Triage Vitals  Encounter Vitals Group     BP 01/13/24 1156 97/60     Girls Systolic BP Percentile --      Girls Diastolic BP Percentile --      Boys Systolic BP Percentile --      Boys Diastolic BP Percentile --      Pulse Rate 01/13/24 1156 (!) 103     Resp 01/13/24 1156 17     Temp 01/13/24 1158 98.3 F (36.8 C)     Temp Source 01/13/24 1158 Oral     SpO2 01/13/24 1156 100 %     Weight 01/13/24 1153 140 lb (63.5 kg)     Height 01/13/24 1153 5' 3 (1.6  m)     Head Circumference --      Peak Flow --      Pain Score 01/13/24 1153 4     Pain Loc --      Pain Education --      Exclude from Growth Chart --     Most recent vital signs: Vitals:   01/13/24 1158 01/13/24 1200  BP:  (!) 120/57  Pulse:  97  Resp:    Temp: 98.3 F (36.8 C)   SpO2:  98%    Physical Exam Vitals and nursing note reviewed.  Constitutional:      General: Awake and alert. No acute distress.    Appearance: Normal appearance. The patient is normal weight.  HENT:     Head: Normocephalic and atraumatic.     Mouth: Mucous membranes are moist.  Eyes:     General: PERRL. Normal EOMs        Right eye: No discharge.        Left eye: No discharge.      Conjunctiva/sclera: Conjunctivae normal.  Cardiovascular:     Rate and Rhythm: Normal rate and regular rhythm.     Pulses: Normal pulses.  Pulmonary:     Effort: Pulmonary effort is normal. No respiratory distress.     Breath sounds: Normal breath sounds.  Abdominal:     Abdomen is soft. There is no abdominal tenderness. No rebound or guarding. No distention. Musculoskeletal:        General: No swelling. Normal range of motion.     Cervical back: Normal range of motion and neck supple.  No cervical spine tenderness Pelvis stable.  Negative logroll bilaterally.  Able to lift both legs up off of stretcher Back: No midline tenderness. Strength and sensation 5/5 to bilateral lower extremities. Normal great toe extension against resistance. Normal sensation throughout feet. Normal patellar reflexes. Negative SLR and opposite SLR bilaterally. Negative FABER test Skin:    General: Skin is warm and dry.     Capillary Refill: Capillary refill takes less than 2 seconds.     Findings: No rash.  Neurological:     Mental Status: The patient is awake and alert.   Neurological: GCS 15 alert and oriented x3 Normal speech, no expressive or receptive aphasia or dysarthria Cranial nerves II through XII intact Normal visual fields 5 out of 5 strength in all 4 extremities with intact sensation throughout No extremity drift Normal finger-to-nose testing, no limb or truncal ataxia   ED Results / Procedures / Treatments   Labs (all labs ordered are listed, but only abnormal results are displayed) Labs Reviewed  COMPREHENSIVE METABOLIC PANEL WITH GFR - Abnormal; Notable for the following components:      Result Value   Glucose, Bld 151 (*)    AST 59 (*)    All other components within normal limits  URINALYSIS, ROUTINE W REFLEX MICROSCOPIC - Abnormal; Notable for the following components:   Color, Urine YELLOW (*)    APPearance TURBID (*)    Hgb urine dipstick MODERATE (*)    Protein, ur 100 (*)     Leukocytes,Ua MODERATE (*)    Bacteria, UA FEW (*)    All other components within normal limits  LACTIC ACID, PLASMA - Abnormal; Notable for the following components:   Lactic Acid, Venous 2.0 (*)    All other components within normal limits  CULTURE, BLOOD (ROUTINE X 2)  CULTURE, BLOOD (ROUTINE X 2)  CBC  LACTIC ACID, PLASMA  CBG MONITORING, ED     EKG     RADIOLOGY     PROCEDURES:  Critical Care performed:   Procedures   MEDICATIONS ORDERED IN ED: Medications  sodium chloride  0.9 % bolus 500 mL (has no administration in time range)  cefTRIAXone  (ROCEPHIN ) 1 g in sodium chloride  0.9 % 100 mL IVPB (has no administration in time range)  sodium chloride  0.9 % bolus 500 mL (500 mLs Intravenous New Bag/Given 01/13/24 1341)     IMPRESSION / MDM / ASSESSMENT AND PLAN / ED COURSE  I reviewed the triage vital signs and the nursing notes.   Differential diagnosis includes, but is not limited to, contusion, urinary tract infection, fracture, electrolyte disarray.  Patient is awake and alert, initial triage vitals were taken as a blood pressure of 97/60 with a heart rate of 103, though this was rechecked 4 minutes later and was normotensive and with a normal heart rate.  She has a normal oxygen  saturation 98% on room air and demonstrates no increased work of breathing.  She is afebrile.  She has a GCS of 15 and is alert and oriented x 4.  No head strike or LOC, no indication for CT head or neck at this time.  Further workup is indicated.  Labs, chest x-ray, pelvis x-ray, and urinalysis ordered.  She has full normal range of motion of bilateral hips, negative logroll bilaterally, is able to lift both legs up off of the stretcher, do not suspect hip fracture, though she is complaining of pain to her buttocks after the fall.  Labs obtained are reassuring with the exception of a mild lactate elevation to 2.0.  No leukocytosis.  She was hydrated with 1 L of normal saline with plan  to recheck her lactate.  Urinalysis sent.  Urinalysis is suspicious for urinary tract infection with leukocytes and WBC clumps.  She was given a dose of Rocephin .  No abdominal pain, flank pain, or fever to suggest pyelonephritis.  No flank pain or history of kidney stones to suggest infected stone.  Patient passed off to oncoming provider, L. Dougherty, PA-C pending repeat lactate, and x-ray results.  If lactate is downtrending and x-rays are reassuring, plan for discharge home on oral antibiotics for urinary tract infection.  She has no mental status change, no weakness, no confusion, no tachycardia or hypotension, and reports that she is feeling her normal self and husband corroborates this, do not suspect that she will require admission.   Patient's presentation is most consistent with acute presentation with potential threat to life or bodily function.   Clinical Course as of 01/13/24 1506  Wed Jan 13, 2024  1334 Husband at the bedside reports that she has been acting her normal self.  She had gotten up in the middle of the night to either use the bathroom or smoke which were both normal behavior for her. [JP]    Clinical Course User Index [JP] Damione Robideau E, PA-C     FINAL CLINICAL IMPRESSION(S) / ED DIAGNOSES   Final diagnoses:  Fall, initial encounter  Lower urinary tract infectious disease  Acute cystitis without hematuria     Rx / DC Orders   ED Discharge Orders     None        Note:  This document was prepared using Dragon voice recognition software and may include unintentional dictation errors.   Billyjack Trompeter E, PA-C 01/13/24 1506    Nicholaus Rolland BRAVO, MD 01/13/24 [phone]  "

## 2024-01-13 NOTE — H&P (Addendum)
 " History and Physical    Beth Stanley FMW:[id number] DOB: 05-09-1950 DOA: 01/13/2024  DOS: the patient was seen and examined on 01/13/2024  PCP: Beth Angeline [name], NP   Patient coming from: Home  I have personally briefly reviewed patient's old medical records in [facility] and CareEverywhere  HPI:   Beth Stanley is a 73 y.o. year old female with medical history of hypertension, hyperlipidemia, type 2 diabetes, follicular lymphoma, osteoarthritis presenting to the ED with generalized weakness and malaise and recent fall.  Patient lethargic on exam which is not her baseline.  She denies any URI symptoms. On arrival to the ED patient was noted to be HDS stable.  CBC without leukocytosis or anemia.  CMP with moderate hyperglycemia and slight elevation in AST otherwise unremarkable.  UA with signs concerning for infection and urine culture pending.  Magnesium  normal.  Lactic acid initially mildly elevated but repeat is normal.  Blood cultures ordered and patient started on ceftriaxone .  Given concern for altered mental status husband did not feel he will be able to take care of her at home and she may have recurrent falls so [facility] contacted for admission.  Review of Systems: As mentioned in the history of present illness. All other systems reviewed and are negative.   Past Medical History:  Diagnosis Date   Acute metabolic encephalopathy 02/20/2022   Anxiety    Arthritis    C. difficile colitis 10/01/2019   COVID-19 virus infection 09/29/2019   Diabetes (HCC)    Heartburn    History of stem cell transplant (HCC)    HTN (hypertension)    Multifocal pneumonia 04/19/2021   Non Hodgkin's lymphoma (HCC) 2004   Hx chemo tx's.     Past Surgical History:  Procedure Laterality Date   TOTAL ABDOMINAL HYSTERECTOMY       Allergies[1]  Family History  Problem Relation Age of Onset   Diabetes Mother    Hypertension Father    Lung cancer Sister    Hypertension Brother    Lung  cancer Brother    Kidney cancer Brother        removed kidney    Hypertension Brother    Lung cancer Brother    Prostate cancer Neg Hx    Bladder Cancer Neg Hx     Prior to Admission medications  Medication Sig Start Date End Date Taking? Authorizing Provider  acetaminophen  (TYLENOL ) 500 MG tablet Take 1,000 mg by mouth 2 (two) times daily as needed for mild pain.   Yes [provider]  albuterol  (PROVENTIL ) (2.5 MG/3ML) 0.083% nebulizer solution Take 3 mLs (2.5 mg total) by nebulization every 6 (six) hours as needed for wheezing or shortness of breath. 01/10/23  Yes Bernardino Ditch, NP  albuterol  (VENTOLIN  HFA) 108 (90 Base) MCG/ACT inhaler Inhale 1 puff into the lungs every 6 (six) hours as needed for wheezing. 02/21/22  Yes Laurita Pillion, MD  b complex vitamins capsule Take 1 capsule by mouth daily.   Yes [provider]  Buprenorphine  HCl-Naloxone  HCl 8-2 MG FILM Place 1 Film under the tongue daily. Home med and dosage. 10/14/22  Yes Awanda City, MD  cephALEXin  (KEFLEX ) 500 MG capsule Take 1 capsule (500 mg total) by mouth 2 (two) times daily for 7 days. 01/13/24 01/20/24 Yes Dougherty, Lauren A, PA-C  Cholecalciferol  1.25 MG (50000 UT) capsule Take 1 tablet by mouth once a week.   Yes [provider]  diphenoxylate -atropine  (LOMOTIL ) 2.5-0.025 MG tablet Take 1  tablet by mouth daily as needed for diarrhea or loose stools. Home med. 10/14/22  Yes Awanda City, MD  magnesium  oxide (MAG-OX) 400 (240 Mg) MG tablet Take 1 tablet by mouth daily. 10/01/20  Yes [provider]  omeprazole  (PRILOSEC) 20 MG capsule Take 20 mg by mouth daily. 08/25/20  Yes [provider]  ondansetron  (ZOFRAN ) 4 MG tablet Take 4 mg by mouth every 6 (six) hours as needed for nausea. 05/05/19  Yes [provider]  potassium chloride  SA (KLOR-CON ) 20 MEQ tablet Take 20 mEq by mouth daily. 07/28/19  Yes [provider]  sertraline  (ZOLOFT ) 100 MG tablet Take 1.5 tablets (150 mg  total) by mouth daily. Home med. 10/14/22  Yes Awanda City, MD  trolamine salicylate (ASPERCREME) 10 % cream Apply topically as needed for muscle pain. 10/14/22  Yes Awanda City, MD  valACYclovir  (VALTREX ) 1000 MG tablet Take 1,000 mg by mouth daily. 08/22/20  Yes [provider]  carboxymethylcellulose (REFRESH PLUS) 0.5 % SOLN Place 1 drop into the left eye 2 (two) times daily. Patient not taking: Reported on 01/13/2024 10/19/20   [provider]  cyanocobalamin  (VITAMIN B12) 1000 MCG tablet Take 1,000 mcg by mouth daily. Patient not taking: Reported on 01/13/2024    [provider]  docusate sodium  (COLACE) 100 MG capsule Take 100 mg by mouth daily as needed for mild constipation. Patient not taking: Reported on 01/13/2024    [provider]  ferrous sulfate  325 (65 FE) MG tablet Take 325 mg by mouth 3 (three) times a week. Monday. Weds, fri Patient not taking: Reported on 01/13/2024 02/11/21   [provider]  JARDIANCE  25 MG TABS tablet Take 25 mg by mouth daily. Patient not taking: Reported on 01/13/2024 08/17/20   [provider]  lidocaine -prilocaine (EMLA) cream Apply 1 Application topically as needed (for port access). 07/22/18   [provider]  meloxicam (MOBIC) 7.5 MG tablet Take 7.5 mg by mouth 2 (two) times daily.    [provider]  metFORMIN  (GLUCOPHAGE ) 500 MG tablet Take 500 mg by mouth daily. Patient not taking: Reported on 01/13/2024 11/10/23   [provider]  ramipril  (ALTACE ) 2.5 MG capsule Take 2.5 mg by mouth daily. Patient not taking: Reported on 01/13/2024    [provider]  Spacer/Aero-Holding Chambers (AEROCHAMBER MV) inhaler Use as instructed 01/10/23   Bernardino Ditch, NP    Social History:  reports that she has been smoking cigarettes. She has never used smokeless tobacco. She reports that she does not currently use alcohol . She reports that she does not use drugs.    Physical  Exam: Vitals:   01/13/24 1156 01/13/24 1158 01/13/24 1200 01/13/24 1656  BP: 97/60  (!) 120/57 110/61  Pulse: (!) 103  97 77  Resp: 17   18  Temp:  98.3 F (36.8 C)  98.5 F (36.9 C)  TempSrc:  Oral  Oral  SpO2: 100%  98% 97%  Weight:      Height:        Gen: NAD HENT: NCAT CV: normal heart sounds Lung: Diffuse wheezing present Abd: No TTP, normal bowel sounds MSK: No asymmetry, good bulk and tone Neuro: somnolent but awakens.   Labs on Admission: I have personally reviewed following labs and imaging studies  CBC: Recent Labs  Lab 01/13/24 1158  WBC 9.4  HGB 13.7  HCT 42.3  MCV 96.4  PLT 154   Basic Metabolic Panel: Recent Labs  Lab 01/13/24 1158  NA 139  K 4.5  CL 100  CO2 26  GLUCOSE 151*  BUN 13  CREATININE 0.98  CALCIUM 10.3  MG 2.1   GFR: Estimated Creatinine Clearance: 45.8 mL/min (by C-G formula based on SCr of 0.98 mg/dL). Liver Function Tests: Recent Labs  Lab 01/13/24 1158  AST 59*  ALT 26  ALKPHOS 73  BILITOT 0.5  PROT 8.1  ALBUMIN 4.8   No results for input(s): LIPASE, AMYLASE in the last 168 hours. No results for input(s): AMMONIA in the last 168 hours. Coagulation Profile: No results for input(s): INR, PROTIME in the last 168 hours. Cardiac Enzymes: No results for input(s): CKTOTAL, CKMB, CKMBINDEX, TROPONINI, TROPONINIHS in the last 168 hours. BNP (last 3 results) No results for input(s): BNP in the last 8760 hours. HbA1C: No results for input(s): HGBA1C in the last 72 hours. CBG: Recent Labs  Lab 01/13/24 1653  GLUCAP 100*   Lipid Profile: No results for input(s): CHOL, HDL, LDLCALC, TRIG, CHOLHDL, LDLDIRECT in the last 72 hours. Thyroid  Function Tests: No results for input(s): TSH, T4TOTAL, FREET4, T3FREE, THYROIDAB in the last 72 hours. Anemia Panel: No results for input(s): VITAMINB12, FOLATE, FERRITIN, TIBC, IRON, RETICCTPCT in the last 72 hours. Urine  analysis:    Component Value Date/Time   COLORURINE YELLOW (A) 01/13/2024 1158   APPEARANCEUR TURBID (A) 01/13/2024 1158   APPEARANCEUR Clear 06/05/2016 1003   LABSPEC 1.023 01/13/2024 1158   PHURINE 5.0 01/13/2024 1158   GLUCOSEU NEGATIVE 01/13/2024 1158   HGBUR MODERATE (A) 01/13/2024 1158   BILIRUBINUR NEGATIVE 01/13/2024 1158   BILIRUBINUR negative 02/16/2017 1628   BILIRUBINUR Negative 06/05/2016 1003   KETONESUR NEGATIVE 01/13/2024 1158   PROTEINUR 100 (A) 01/13/2024 1158   UROBILINOGEN 0.2 02/16/2017 1628   UROBILINOGEN 0.2 01/24/2010 1052   NITRITE NEGATIVE 01/13/2024 1158   LEUKOCYTESUR MODERATE (A) 01/13/2024 1158    Radiological Exams on Admission: I have personally reviewed images DG Pelvis 1-2 Views Result Date: 01/13/2024 EXAM: 1 or 2 VIEW(S) XRAY OF THE PELVIS 01/13/2024 03:00:00 PM COMPARISON: Pelvis 10/11/2022. CLINICAL HISTORY: fall FINDINGS: BONES AND JOINTS: No acute fracture. No malalignment. Degenerative changes in visualized lower lumbar spine. Moderate degenerative changes of bilateral hip joints with osteophyte formation and mild-to-moderate joint space narrowing. SOFT TISSUES: The soft tissues are unremarkable. IMPRESSION: 1. No evidence of acute traumatic injury. Electronically signed by: Morgane Naveau MD 01/13/2024 03:24 PM EST RP Workstation: [id number]   DG Chest 2 View Result Date: 01/13/2024 EXAM: 2 VIEW(S) XRAY OF THE CHEST 01/13/2024 03:00:00 PM COMPARISON: 10/11/2022 CLINICAL HISTORY: fall FINDINGS: LINES, TUBES AND DEVICES: Right Port-A-Cath in place with tip overlying right atrium. LUNGS AND PLEURA: Stable biapical pleuroparenchymal scarring. No pleural effusion. No pneumothorax. HEART AND MEDIASTINUM: No acute abnormality of the cardiac and mediastinal silhouettes. BONES AND SOFT TISSUES: Thoracic degenerative changes. No acute osseous abnormality. IMPRESSION: 1. No acute cardiopulmonary abnormality. 2. Right Port-A-Cath with tip overlying the right  atrium. Electronically signed by: Morgane Naveau MD 01/13/2024 03:23 PM EST RP Workstation: [id number]    EKG: My personal interpretation of EKG shows: Normal sinus rhythm without any acute ST changes.    Assessment/Plan Principal Problem:   Acute encephalopathy Active Problems:   UTI (urinary tract infection)   Lymphoma (HCC)   GERD (gastroesophageal reflux disease)   Hyperlipidemia   Hypertension   Diabetes mellitus type 2, noninsulin dependent (HCC)   Patient with acute encephalopathy likely secondary to UTI. Urine and blood culture pending. Pt started on IV Ceftriaxone  1  g daily.  Monitor neurological status as patient is getting treatment for UTI. -Continue current abx therapy -Follow up cultures -Monitor fever curve and leukocytosis  Generalized weakness/fall: Getting PT and OT.  Hypertension: Patient appears not to be taking any pharmacotherapy for this.  Will await medication reconciliation.  Patient is normotensive here and will continue to monitor.  Hyperlipidemia: Not on any pharmacotherapy, will repeat lipid panel.  Last LDL 67 in 2021.  Type 2 diabetes mellitus: On Jardiance  and metformin .  Will start SSI here.  GERD: Continue home Prilosec.  Lymphoma: This is being monitored by her PCP.  Last visit on 12/14/2023 reviewed.  Continue outpatient follow-up with this.  VTE prophylaxis:  SQ Heparin   Diet: Code Status:  Full Code Telemetry:  Admission status: Observation, Telemetry bed Patient is from: Home Anticipated d/c is to: Home Anticipated d/c is in: 1-2 days   Family Communication: Updated at bedside  Consults called: None   Severity of Illness: The appropriate patient status for this patient is OBSERVATION. Observation status is judged to be reasonable and necessary in order to provide the required intensity of service to ensure the patient's safety. The patient's presenting symptoms, physical exam findings, and initial radiographic and laboratory  data in the context of their medical condition is felt to place them at decreased risk for further clinical deterioration. Furthermore, it is anticipated that the patient will be medically stable for discharge from the hospital within 2 midnights of admission.    Morene Bathe, MD [facility]     [1]  Allergies Allergen Reactions   Aspirin     Stomach cramp  Other Reaction(s): abdominal pain, Not available   Latex Swelling    Other Reaction(s): Angioedema, Not available   Other    Tape Rash    Makes Whelps on Skin Makes Whelps on Skin   Wound Dressing Adhesive Rash    Makes Whelps on Skin, Prefers paper tape   "

## 2024-01-13 NOTE — ED Triage Notes (Signed)
 Clemens about 1hr ago in home. DID NOT HIT HEAD, NOT ON BLOOD THINNER. Strong urine smell. Patient has port. got cancer treatment in past about 2 years ago. Patient was stating 88% on room air. EMS gave 1 dou-neb. and O2 came up to 98% on room air. Patient lives at home with husband and states she slid into the floor.   122/68,  Fall, Possible UTI, low grade fever, Wheezing.

## 2024-01-14 ENCOUNTER — Observation Stay

## 2024-01-14 DIAGNOSIS — G9340 Encephalopathy, unspecified: Secondary | ICD-10-CM

## 2024-01-14 LAB — CBC
HCT: 35.3 % — ABNORMAL LOW (ref 36.0–46.0)
Hemoglobin: 11.7 g/dL — ABNORMAL LOW (ref 12.0–15.0)
MCH: 31.7 pg (ref 26.0–34.0)
MCHC: 33.1 g/dL (ref 30.0–36.0)
MCV: 95.7 fL (ref 80.0–100.0)
Platelets: 148 K/uL — ABNORMAL LOW (ref 150–400)
RBC: 3.69 MIL/uL — ABNORMAL LOW (ref 3.87–5.11)
RDW: 14.8 % (ref 11.5–15.5)
WBC: 7.4 K/uL (ref 4.0–10.5)
nRBC: 0 % (ref 0.0–0.2)

## 2024-01-14 LAB — RESP PANEL BY RT-PCR (RSV, FLU A&B, COVID)  RVPGX2
Influenza A by PCR: NEGATIVE
Influenza B by PCR: NEGATIVE
Resp Syncytial Virus by PCR: NEGATIVE
SARS Coronavirus 2 by RT PCR: NEGATIVE

## 2024-01-14 LAB — BASIC METABOLIC PANEL WITH GFR
Anion gap: 13 (ref 5–15)
BUN: 15 mg/dL (ref 8–23)
CO2: 22 mmol/L (ref 22–32)
Calcium: 8.9 mg/dL (ref 8.9–10.3)
Chloride: 105 mmol/L (ref 98–111)
Creatinine, Ser: 0.81 mg/dL (ref 0.44–1.00)
GFR, Estimated: >60 mL/min
Glucose, Bld: 110 mg/dL — ABNORMAL HIGH (ref 70–99)
Potassium: 3.9 mmol/L (ref 3.5–5.1)
Sodium: 140 mmol/L (ref 135–145)

## 2024-01-14 LAB — FOLATE: Folate: 15.7 ng/mL

## 2024-01-14 LAB — IRON AND TIBC
Iron: 95 ug/dL (ref 28–170)
Saturation Ratios: 25 % (ref 10.4–31.8)
TIBC: 374 ug/dL (ref 250–450)
UIBC: 279 ug/dL

## 2024-01-14 LAB — PHOSPHORUS: Phosphorus: 3.6 mg/dL (ref 2.5–4.6)

## 2024-01-14 LAB — VITAMIN B12: Vitamin B-12: 628 pg/mL (ref 180–914)

## 2024-01-14 LAB — GLUCOSE, CAPILLARY
Glucose-Capillary: 114 mg/dL — ABNORMAL HIGH (ref 70–99)
Glucose-Capillary: 123 mg/dL — ABNORMAL HIGH (ref 70–99)

## 2024-01-14 MED ORDER — PREDNISONE 20 MG PO TABS
40.0000 mg | ORAL_TABLET | Freq: Every day | ORAL | 0 refills | Status: AC
  Filled 2024-01-14 – 2024-01-15 (×2): qty 8, 4d supply, fill #0

## 2024-01-14 MED ORDER — PREDNISONE 20 MG PO TABS
40.0000 mg | ORAL_TABLET | Freq: Every day | ORAL | Status: DC
  Administered 2024-01-14: 40 mg via ORAL
  Filled 2024-01-14: qty 2

## 2024-01-14 MED ORDER — CEPHALEXIN 500 MG PO CAPS
500.0000 mg | ORAL_CAPSULE | Freq: Two times a day (BID) | ORAL | 0 refills | Status: DC
  Filled 2024-01-14: qty 10, 5d supply, fill #0

## 2024-01-14 MED ORDER — FLUTICASONE FUROATE-VILANTEROL 200-25 MCG/ACT IN AEPB
1.0000 | INHALATION_SPRAY | Freq: Every day | RESPIRATORY_TRACT | Status: DC
  Administered 2024-01-14: 1 via RESPIRATORY_TRACT
  Filled 2024-01-14: qty 28

## 2024-01-14 NOTE — Plan of Care (Signed)
" °  Problem: Education: Goal: Ability to describe self-care measures that may prevent or decrease complications (Diabetes Survival Skills Education) will improve Outcome: Adequate for Discharge Goal: Individualized Educational Video(s) Outcome: Adequate for Discharge   Problem: Coping: Goal: Ability to adjust to condition or change in health will improve 01/14/2024 1533 by Beth Stanley L, RN Outcome: Adequate for Discharge 01/14/2024 1531 by Beth Stanley L, RN Outcome: Progressing   Problem: Fluid Volume: Goal: Ability to maintain a balanced intake and output will improve Outcome: Adequate for Discharge   Problem: Health Behavior/Discharge Planning: Goal: Ability to identify and utilize available resources and services will improve Outcome: Adequate for Discharge Goal: Ability to manage health-related needs will improve Outcome: Adequate for Discharge   Problem: Metabolic: Goal: Ability to maintain appropriate glucose levels will improve Outcome: Adequate for Discharge   Problem: Nutritional: Goal: Maintenance of adequate nutrition will improve Outcome: Adequate for Discharge Goal: Progress toward achieving an optimal weight will improve Outcome: Adequate for Discharge   Problem: Skin Integrity: Goal: Risk for impaired skin integrity will decrease Outcome: Adequate for Discharge   Problem: Tissue Perfusion: Goal: Adequacy of tissue perfusion will improve Outcome: Adequate for Discharge   Problem: Education: Goal: Knowledge of General Education information will improve Description: Including pain rating scale, medication(s)/side effects and non-pharmacologic comfort measures 01/14/2024 1533 by Beth Stanley L, RN Outcome: Adequate for Discharge 01/14/2024 1531 by Beth Stanley L, RN Outcome: Progressing   Problem: Health Behavior/Discharge Planning: Goal: Ability to manage health-related needs will improve Outcome: Adequate for Discharge   Problem: Clinical  Measurements: Goal: Ability to maintain clinical measurements within normal limits will improve Outcome: Adequate for Discharge Goal: Will remain free from infection Outcome: Adequate for Discharge Goal: Diagnostic test results will improve Outcome: Adequate for Discharge Goal: Respiratory complications will improve Outcome: Adequate for Discharge Goal: Cardiovascular complication will be avoided Outcome: Adequate for Discharge   Problem: Activity: Goal: Risk for activity intolerance will decrease 01/14/2024 1533 by Beth Stanley L, RN Outcome: Adequate for Discharge 01/14/2024 1531 by Beth Stanley L, RN Outcome: Progressing   Problem: Nutrition: Goal: Adequate nutrition will be maintained Outcome: Adequate for Discharge   Problem: Coping: Goal: Level of anxiety will decrease Outcome: Adequate for Discharge   Problem: Elimination: Goal: Will not experience complications related to bowel motility Outcome: Adequate for Discharge Goal: Will not experience complications related to urinary retention Outcome: Adequate for Discharge   Problem: Pain Managment: Goal: General experience of comfort will improve and/or be controlled 01/14/2024 1533 by Beth Stanley L, RN Outcome: Adequate for Discharge 01/14/2024 1531 by Beth Stanley L, RN Outcome: Progressing   Problem: Safety: Goal: Ability to remain free from injury will improve 01/14/2024 1533 by Beth Stanley L, RN Outcome: Adequate for Discharge 01/14/2024 1531 by Beth Stanley L, RN Outcome: Progressing   Problem: Skin Integrity: Goal: Risk for impaired skin integrity will decrease 01/14/2024 1533 by Beth Stanley L, RN Outcome: Adequate for Discharge 01/14/2024 1531 by Beth Stanley L, RN Outcome: Progressing   "

## 2024-01-14 NOTE — Evaluation (Signed)
 Occupational Therapy Evaluation Patient Details Name: Beth Stanley MRN: [id number] DOB: [birth year] Today's Date: 01/14/2024   History of Present Illness   Pt is a 73 year presented to the ED with generalized weakness and malaise and recent fall, MD work up includes acute encephalopathy likely secondary to UTI.    Pmh significant for hypertension, hyperlipidemia, type 2 diabetes, follicular lymphoma, osteoarthritis     Clinical Impressions Chart reviewd to date, pt greeted semi supine in bed, alert and oriented x4. Appropriate one step direction following, ?awareness, will continue to assess. PTA pt reports she is MOD I-I in ADL/IADL, amb with SPC. 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 Pt presents with deficits in activity tolerance, balance, cognition, affecting safe and optimal ADL completion. Pt performs bed mobility with MOD I, STS with CGA, amb via HHA with CGA-MIN A. Anticipate SET UP for grooming/feeding tasks. Husband reports he feels she is close to baseline. Pt is left in wheelchair for transport to [place], all needs met. OT will follow.      If plan is discharge home, recommend the following:   A little help with walking and/or transfers;A little help with bathing/dressing/bathroom;Direct supervision/assist for medications management     Functional Status Assessment   Patient has had a recent decline in their functional status and demonstrates the ability to make significant improvements in function in a reasonable and predictable amount of time.     Equipment Recommendations   Tub/shower seat     Recommendations for Other Services         Precautions/Restrictions   Precautions Precautions: Fall Recall of Precautions/Restrictions: Impaired     Mobility Bed Mobility Overal bed mobility: Needs Assistance Bed Mobility: Supine to Sit     Supine to sit: Modified independent (Device/Increase time)           Transfers Overall transfer level: Needs assistance Equipment used: 1 person hand held assist Transfers: Sit to/from Stand Sit to Stand: CGA                  Balance Overall balance assessment: Needs assistance Sitting-balance support: Feet supported Sitting balance-Leahy Scale: Good     Standing balance support: Single extremity supported Standing balance-Leahy Scale: Fair                             ADL either performed or assessed with clinical judgement   ADL Overall ADL's : Needs assistance/impaired    Feeding: SET UP Grooming; SET UP                  Lower Body Dressing: Minimal assistance Lower Body Dressing Details (indicate cue type and reason): anticipate Toilet Transfer: Contact guard assist;Minimal assistance;Ambulation Toilet Transfer Details (indicate cue type and reason): simulated, via HHA         Functional mobility during ADLs: Contact guard assist;Minimal assistance (via HHA approx 15')       Vision Patient Visual Report: No change from baseline       Perception         Praxis         Pertinent Vitals/Pain Pain Assessment Pain Assessment: No/denies pain     Extremity/Trunk Assessment Upper Extremity Assessment Upper Extremity Assessment: Overall WFL for tasks assessed   Lower Extremity Assessment Lower Extremity Assessment: Defer to PT evaluation       Communication Communication Communication: Impaired Factors Affecting Communication:  Hearing impaired   Cognition Arousal: Alert Behavior During Therapy: WFL for tasks assessed/performed Cognition: Cognition impaired           Executive functioning impairment (select all impairments): Problem solving OT - Cognition Comments: husband reports he feels she is returning to baseline cognition                 Following commands: Intact       Cueing  General Comments   Cueing Techniques: Verbal cues  pt reports SOB, spo2 >90% on RA, HR  102 bpm sitting on edge of bed   Exercises Other Exercises Other Exercises: edu/pt husband re role of OT, role of rehab, discharge recommendations, safer ADL completion with AE/DME   Shoulder Instructions      Home Living Family/patient expects to be discharged to:: Private residence Living Arrangements: Spouse/significant other Available Help at Discharge: Family;Available 24 hours/day Type of Home: House Home Access: Stairs to enter [employer] of Steps: 7 Entrance Stairs-Rails: Can reach both Home Layout: Able to live on main level with bedroom/bathroom (has a sunroom with 3 additional steps, but everything else on main floor)     Bathroom Shower/Tub: Arts Development Officer Toilet: Handicapped height     Home Equipment: Agricultural Consultant (2 wheels);Cane - single point;BSC/3in1;Grab bars - tub/shower;Grab bars - toilet          Prior Functioning/Environment Prior Level of Function : Working/employed;Driving;History of Falls (last six months);Independent/Modified Independent             Mobility Comments: amb with SPC home/community distances; ADLs Comments: MOD I-I in ADL/IADL; 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, Works 5 days a week    OT Problem List: Decreased activity tolerance;Decreased knowledge of use of DME or AE;Decreased cognition;Decreased safety awareness;Impaired balance (sitting and/or standing)   OT Treatment/Interventions: Self-care/ADL training;Therapeutic exercise;DME and/or AE instruction;Energy conservation;Therapeutic activities;Patient/family education;Cognitive remediation/compensation      OT Goals(Current goals can be found in the care plan section)   Acute Rehab OT Goals Patient Stated Goal: go home OT Goal Formulation: With patient Time For Goal Achievement: 01/28/24 Potential to Achieve Goals: Good ADL Goals Pt Will Perform Grooming: with modified independence;sitting;standing Pt Will Perform Lower Body Dressing: with  modified independence;sitting/lateral leans;sit to/from stand Pt Will Transfer to Toilet: with modified independence;ambulating Pt Will Perform Toileting - Clothing Manipulation and hygiene: with modified independence;sit to/from stand;sitting/lateral leans   OT Frequency:  Min 2X/week    Co-evaluation              AM-PAC OT 6 Clicks Daily Activity     Outcome Measure Help from another person eating meals?: None Help from another person taking care of personal grooming?: None Help from another person toileting, which includes using toliet, bedpan, or urinal?: None Help from another person bathing (including washing, rinsing, drying)?: A Little Help from another person to put on and taking off regular upper body clothing?: None Help from another person to put on and taking off regular lower body clothing?: A Little 6 Click Score: 22   End of Session Nurse Communication: Mobility status  Activity Tolerance: Patient tolerated treatment well Patient left:  (in transport chair to go to [place])  OT Visit Diagnosis: Other abnormalities of gait and mobility (R26.89)                Time: 9142-9092 OT Time Calculation (min): 10 min Charges:  OT General Charges $OT Visit: 1 Visit OT Evaluation $OT Eval Low Complexity:  1 Low  Therisa Sheffield, OTD OTR/L  01/14/2024, 9:21 AM

## 2024-01-14 NOTE — TOC CM/SW Note (Signed)
 Transition of Care [facility]) - Inpatient Brief Assessment   Patient Details  Name: Beth Stanley MRN: [id number] Date of Birth: 11-09-1950  [facility] CM/SW Contact:    Nathanael CHRISTELLA Ring, RN Phone Number: 01/14/2024, 2:34 PM   Clinical Narrative: Spoke with patient at the bedside about home health recommendation.  She declines home health and says that she is going back to work.  She has a walker and drives.  She works as a producer, television/film/video.  Husband will transport home.    Transition of Care Asessment: Insurance and Status: Insurance coverage has been reviewed Patient has primary care physician: Yes Home environment has been reviewed: from home with husband Prior level of function:: independent Prior/Current Home Services: No current home services Social Drivers of Health Review: SDOH reviewed no interventions necessary Readmission risk has been reviewed: No (observation) Transition of care needs: no transition of care needs at this time

## 2024-01-14 NOTE — Plan of Care (Signed)
" °  Problem: Coping: Goal: Ability to adjust to condition or change in health will improve Outcome: Progressing   Problem: Education: Goal: Knowledge of General Education information will improve Description: Including pain rating scale, medication(s)/side effects and non-pharmacologic comfort measures Outcome: Progressing   Problem: Activity: Goal: Risk for activity intolerance will decrease Outcome: Progressing   Problem: Pain Managment: Goal: General experience of comfort will improve and/or be controlled Outcome: Progressing   Problem: Safety: Goal: Ability to remain free from injury will improve Outcome: Progressing   Problem: Skin Integrity: Goal: Risk for impaired skin integrity will decrease Outcome: Progressing   "

## 2024-01-14 NOTE — Evaluation (Signed)
 Physical Therapy Evaluation Patient Details Name: Beth Stanley MRN: [id number] DOB: Feb 27, 1950 Today's Date: 01/14/2024  History of Present Illness  Pt is a 73 year presented to the ED with generalized weakness and malaise and recent fall, MD work up includes acute encephalopathy likely secondary to UTI.    Pmh significant for hypertension, hyperlipidemia, type 2 diabetes, follicular lymphoma, osteoarthritis  Clinical Impression  Patient received in bed, she is agreeable to PT session. Patient currently having orthostatics checked with RN present. Patient initially ambulated with single hand held A, requiring min A for stability. Ambulated again with RW with improved balance and safety. Patient encouraged to use walker at this time. She will continue to benefit from skilled PT to improve strength and safety with mobility.          If plan is discharge home, recommend the following: A little help with walking and/or transfers;Help with stairs or ramp for entrance;Assist for transportation   Can travel by private vehicle    yes    Equipment Recommendations None recommended by PT  Recommendations for Other Services       Functional Status Assessment Patient has had a recent decline in their functional status and demonstrates the ability to make significant improvements in function in a reasonable and predictable amount of time.     Precautions / Restrictions Precautions Precautions: Fall Recall of Precautions/Restrictions: Impaired Restrictions Weight Bearing Restrictions Per Provider Order: No      Mobility  Bed Mobility Overal bed mobility: Modified Independent Bed Mobility: Supine to Sit, Sit to Supine     Supine to sit: Supervision Sit to supine: Supervision        Transfers Overall transfer level: Needs assistance Equipment used: 1 person hand held assist, Rolling walker (2 wheels) Transfers: Sit to/from Stand Sit to Stand: Contact guard assist            General transfer comment: Min A without RW    Ambulation/Gait Ambulation/Gait assistance: Min assist, Contact guard assist Gait Distance (Feet): 150 Feet Assistive device: Rolling walker (2 wheels), 1 person hand held assist Gait Pattern/deviations: Step-through pattern, Decreased step length - right, Decreased step length - left, Trunk flexed Gait velocity: decr     General Gait Details: patient ambulated initially with 1 hand held A, unsteady with this requiring min A. Walked again using walker and patient with increased steadiness and safety. Requiring only cga to supervision. Patient encouraged to use RW at this time. Ambulation distance limited by sob and wheezing. O2 sats WNL.  Stairs            Wheelchair Mobility     Tilt Bed    Modified Rankin (Stroke Patients Only)       Balance Overall balance assessment: Needs assistance Sitting-balance support: Feet supported Sitting balance-Leahy Scale: Normal     Standing balance support: Bilateral upper extremity supported, During functional activity, Reliant on assistive device for balance Standing balance-Leahy Scale: Good Standing balance comment: improved with B UE support                             Pertinent Vitals/Pain Pain Assessment Pain Assessment: No/denies pain    Home Living Family/patient expects to be discharged to:: Private residence Living Arrangements: Spouse/significant other Available Help at Discharge: Family;Available 24 hours/day Type of Home: House Home Access: Stairs to enter Entrance Stairs-Rails: Right;Left;Can reach both Entrance Stairs-Number of Steps: 7   Home Layout: Able to live  on main level with bedroom/bathroom Home Equipment: Rolling Walker (2 wheels);Cane - single point;BSC/3in1;Grab bars - tub/shower;Grab bars - toilet      Prior Function Prior Level of Function : Working/employed;Driving;History of Falls (last six months);Independent/Modified Independent              Mobility Comments: amb with SPC home/community distances; has walker and encouraged to use ADLs Comments: MOD I-I in ADL/IADL; 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, Works 5 days a week     Extremity/Trunk Assessment   Upper Extremity Assessment Upper Extremity Assessment: Overall WFL for tasks assessed    Lower Extremity Assessment Lower Extremity Assessment: Generalized weakness    Cervical / Trunk Assessment Cervical / Trunk Assessment: Normal  Communication   Communication Communication: Impaired Factors Affecting Communication: Hearing impaired    Cognition Arousal: Alert Behavior During Therapy: WFL for tasks assessed/performed   PT - Cognitive impairments: No apparent impairments                         Following commands: Intact       Cueing Cueing Techniques: Verbal cues     General Comments General comments (skin integrity, edema, etc.): pt reports SOB, spo2 >90% on RA, HR 102 bpm sitting on edge of bed    Exercises     Assessment/Plan    PT Assessment Patient needs continued PT services  PT Problem List Decreased strength;Decreased activity tolerance;Decreased balance;Decreased mobility;Cardiopulmonary status limiting activity       PT Treatment Interventions DME instruction;Gait training;Stair training;Functional mobility training;Therapeutic activities;Therapeutic exercise;Balance training;Neuromuscular re-education;Cognitive remediation;Patient/family education    PT Goals (Current goals can be found in the Care Plan section)  Acute Rehab PT Goals Patient Stated Goal: to go home PT Goal Formulation: With patient/family Time For Goal Achievement: 01/21/24 Potential to Achieve Goals: Good    Frequency Min 2X/week     Co-evaluation               AM-PAC PT 6 Clicks Mobility  Outcome Measure Help needed turning from your back to your side while in a flat bed without using bedrails?: None Help needed moving  from lying on your back to sitting on the side of a flat bed without using bedrails?: None Help needed moving to and from a bed to a chair (including a wheelchair)?: A Little Help needed standing up from a chair using your arms (e.g., wheelchair or bedside chair)?: A Little Help needed to walk in hospital room?: A Little Help needed climbing 3-5 steps with a railing? : A Little 6 Click Score: 20    End of Session   Activity Tolerance: Patient limited by fatigue;Other (comment) (SOB and wheezing) Patient left: in bed;with call bell/phone within reach;with bed alarm set;with family/visitor present Nurse Communication: Mobility status PT Visit Diagnosis: Other abnormalities of gait and mobility (R26.89);Muscle weakness (generalized) (M62.81);Difficulty in walking, not elsewhere classified (R26.2);Unsteadiness on feet (R26.81)    Time: 8845-8775 PT Time Calculation (min) (ACUTE ONLY): 30 min   Charges:   PT Evaluation $PT Eval Low Complexity: 1 Low PT Treatments $Gait Training: 8-22 mins PT General Charges $$ ACUTE PT VISIT: 1 Visit         Skylinn Vialpando, PT, GCS 01/14/2024,1:08 PM

## 2024-01-14 NOTE — Plan of Care (Signed)
  Problem: Coping: Goal: Ability to adjust to condition or change in health will improve Outcome: Progressing   Problem: Health Behavior/Discharge Planning: Goal: Ability to identify and utilize available resources and services will improve Outcome: Progressing   Problem: Nutritional: Goal: Maintenance of adequate nutrition will improve Outcome: Progressing   Problem: Education: Goal: Knowledge of General Education information will improve Description: Including pain rating scale, medication(s)/side effects and non-pharmacologic comfort measures Outcome: Progressing

## 2024-01-14 NOTE — Discharge Summary (Signed)
 [facility] Discharge Summary   Patient: Beth Stanley FMW:[id number]  PCP: Silvano Angeline [name], NP  Date of admission: 01/13/2024   Date of discharge:  01/14/2024     Discharge Diagnoses:  Principal Problem:   Acute encephalopathy Active Problems:   UTI (urinary tract infection)   Lymphoma (HCC)   GERD (gastroesophageal reflux disease)   Hyperlipidemia   Hypertension   Diabetes mellitus type 2, noninsulin dependent (HCC)   Admitted From: Home Disposition:  Home   Recommendations for Outpatient Follow-up:  Follow-up with PCP in 1 week Follow-up pending urine culture report Started prednisone  40 mg p.o. daily for total 5 days and continue to use inhalers at home and follow with PCP. Smoking cessation discussed Follow up LABS/TEST:    1/3 Called patient and changed antibiotics, discontinued Keflex  and started on ciprofloxacin  500 twice daily for 5 days.   Follow-up Information     Silvano Angeline [name], NP.   Why: As needed Contact information: [address] [place] [address] [phone]                Diet recommendation: Cardiac diet  Activity: The patient is advised to gradually reintroduce usual activities, as tolerated  Discharge Condition: stable  Code Status: Full code   History of present illness: As per the H and P dictated on admission.  Hospital Course:   Beth Stanley is a 74 y.o. year old female with medical history of hypertension, hyperlipidemia, type 2 diabetes, follicular lymphoma, osteoarthritis presenting to the ED with generalized weakness and malaise and recent fall.  Patient lethargic on exam which is not her baseline.  She denies any URI symptoms. On arrival to the ED patient was noted to be HDS stable.  CBC without leukocytosis or anemia.  CMP with moderate hyperglycemia and slight elevation in AST otherwise unremarkable.  UA with signs concerning for infection and urine culture pending.  Magnesium  normal.  Lactic acid  initially mildly elevated but repeat is normal.  Blood cultures ordered and patient started on ceftriaxone .  Given concern for altered mental status husband did not feel he will be able to take care of her at home and she may have recurrent falls so [facility] contacted for admission.    Assessment/Plan:  # Acute metabolic encephalopathy most likely secondary to UTI. Encephalopathy resolved, patient is back to her baseline.  # UTI UA positive S/p ceftriaxone  IV given during hospital stay. Patient was transition to oral antibiotics because she wanted to go home and does not want to stay another night in the hospital.  Patient feels that she is all right denies any complaints. 1/3 urine culture report finalized today, growing Pseudomonas, pansensitive. Discontinued Keflex  and sent prescription of Keflex  40 mg p.o. twice daily for 5 days.  I called the patient and her daughter pick up the phone and sent prescription to [facility] pharmacy as per their preference.   # Hypertension: BP remained stable, patient is not on any medications. Recommended to monitor BP and follow with PCP.  # Hyperlipidemia: Last LDL 67 in 2021. Not on statin   # Type 2 diabetes mellitus: On Jardiance  which was resumed.  Continue diabetic diet, monitor CBG and follow with PCP.   # GERD: Continue home Prilosec.   # Lymphoma: This is being monitored by her PCP.  Last visit on 12/14/2023 reviewed.  Continue outpatient follow-up with this.  # Generalized weakness/fall: PT and OT eval done, recommended home health services but patient declined as per  TOC.  Body mass index is 25.46 kg/m.  Nutrition Interventions:  - Patient was instructed, not to drive, operate heavy machinery, perform activities at heights, swimming or participation in water activities or provide baby sitting services while on Pain, Sleep and Anxiety Medications; until her outpatient Physician has advised to do so again.  - Also recommended to not to take  more than prescribed Pain, Sleep and Anxiety Medications.  Patient was seen by physical therapy, who recommended Home health, but patient declined as per TOC.   On the day of the discharge the patient's vitals were stable, and no other acute medical condition were reported by patient. the patient was felt safe to be discharge at Home with Home health.  Consultants: None Procedures: None  Discharge Exam: General: Appear in no distress, Oral Mucosa Clear, moist. Cardiovascular: S1 and S2 Present, no Murmur, Respiratory: normal respiratory effort, Bilateral Air entry present and no Crackles, no wheezes Abdomen: Bowel Sound present, Soft and no tenderness. Extremities: no Pedal edema, no calf tenderness Neurology: alert and oriented to time, place, and person affect appropriate.  Filed Weights   01/13/24 1153 01/14/24 0500  Weight: 63.5 kg 65.2 kg   Vitals:   01/14/24 1202 01/14/24 1205  BP: 136/66 136/64  Pulse: 92   Resp:    Temp:    SpO2:      DISCHARGE MEDICATION: Allergies as of 01/14/2024       Reactions   Aspirin    Stomach cramp Other Reaction(s): abdominal pain, Not available   Latex Swelling   Other Reaction(s): Angioedema, Not available   Other    Tape Rash   Makes Whelps on Skin Makes Whelps on Skin   Wound Dressing Adhesive Rash   Makes Whelps on Skin, Prefers paper tape        Medication List     STOP taking these medications    carboxymethylcellulose 0.5 % Soln Commonly known as: REFRESH PLUS   cyanocobalamin  1000 MCG tablet Commonly known as: VITAMIN B12   docusate sodium  100 MG capsule Commonly known as: COLACE   ferrous sulfate  325 (65 FE) MG tablet   metFORMIN  500 MG tablet Commonly known as: GLUCOPHAGE    ramipril  2.5 MG capsule Commonly known as: ALTACE        TAKE these medications    acetaminophen  500 MG tablet Commonly known as: TYLENOL  Take 1,000 mg by mouth 2 (two) times daily as needed for mild pain.   AeroChamber MV  inhaler Use as instructed   albuterol  108 (90 Base) MCG/ACT inhaler Commonly known as: VENTOLIN  HFA Inhale 1 puff into the lungs every 6 (six) hours as needed for wheezing.   albuterol  (2.5 MG/3ML) 0.083% nebulizer solution Commonly known as: PROVENTIL  Take 3 mLs (2.5 mg total) by nebulization every 6 (six) hours as needed for wheezing or shortness of breath.   b complex vitamins capsule Take 1 capsule by mouth daily.   Buprenorphine  HCl-Naloxone  HCl 8-2 MG Film Place 1 Film under the tongue daily. Home med and dosage.   cephALEXin  500 MG capsule Commonly known as: KEFLEX  Take 1 capsule (500 mg total) by mouth 2 (two) times daily for 5 days.   Cholecalciferol  1.25 MG (50000 UT) capsule Take 1 tablet by mouth once a week.   diphenoxylate -atropine  2.5-0.025 MG tablet Commonly known as: LOMOTIL  Take 1 tablet by mouth daily as needed for diarrhea or loose stools. Home med.   Jardiance  25 MG Tabs tablet Generic drug: empagliflozin  Take 25 mg by mouth daily.  lidocaine -prilocaine cream Commonly known as: EMLA Apply 1 Application topically as needed (for port access).   magnesium  oxide 400 (240 Mg) MG tablet Commonly known as: MAG-OX Take 1 tablet by mouth daily.   meloxicam 7.5 MG tablet Commonly known as: MOBIC Take 7.5 mg by mouth 2 (two) times daily.   omeprazole  20 MG capsule Commonly known as: PRILOSEC Take 20 mg by mouth daily.   ondansetron  4 MG tablet Commonly known as: ZOFRAN  Take 4 mg by mouth every 6 (six) hours as needed for nausea.   potassium chloride  SA 20 MEQ tablet Commonly known as: KLOR-CON  M Take 20 mEq by mouth daily.   predniSONE  20 MG tablet Commonly known as: DELTASONE  Take 2 tablets (40 mg total) by mouth daily with breakfast for 4 days.   sertraline  100 MG tablet Commonly known as: ZOLOFT  Take 1.5 tablets (150 mg total) by mouth daily. Home med.   trolamine salicylate 10 % cream Commonly known as: ASPERCREME Apply topically as  needed for muscle pain.   valACYclovir  1000 MG tablet Commonly known as: VALTREX  Take 1,000 mg by mouth daily.       On 1/3 Started ciprofloxacin  powder milligram p.o. twice daily for 5 days  Discontinued Keflex   Allergies[1] Discharge Instructions     Call MD for:  difficulty breathing, headache or visual disturbances   Complete by: As directed    Call MD for:  persistant dizziness or light-headedness   Complete by: As directed    Call MD for:  persistant nausea and vomiting   Complete by: As directed    Call MD for:  severe uncontrolled pain   Complete by: As directed    Call MD for:  temperature >100.4   Complete by: As directed    Discharge instructions   Complete by: As directed    Follow-up with PCP in 1 week Follow-up pending urine culture report Started prednisone  40 mg p.o. daily for total 5 days and continue to use inhalers at home and follow with PCP. Smoking cessation discussed   Increase activity slowly   Complete by: As directed        The results of significant diagnostics from this hospitalization (including imaging, microbiology, ancillary and laboratory) are listed below for reference.    Significant Diagnostic Studies: CT HEAD WO CONTRAST Result Date: 01/14/2024 EXAM: CT HEAD WITHOUT CONTRAST 01/14/2024 09:14:06 AM TECHNIQUE: CT of the head was performed without the administration of intravenous contrast. Automated exposure control, iterative reconstruction, and/or weight based adjustment of the mA/kV was utilized to reduce the radiation dose to as low as reasonably achievable. COMPARISON: None available. CLINICAL HISTORY: Mental status change, unknown cause FINDINGS: BRAIN AND VENTRICLES: No acute hemorrhage. No evidence of acute infarct. Periventricular and subcortical white matter hypoattenuation, likely chronic ischemic microvascular disease. Moderate-advanced cerebral volume loss. Remote lacunar infarcts in left basal ganglia. Carotid siphon  calcifications. No hydrocephalus. No extra-axial collection. No mass effect or midline shift. ORBITS: Bilateral lens replacement. No acute abnormality. SINUSES: Ethmoid air cell mucosal thickening. SOFT TISSUES AND SKULL: No acute soft tissue abnormality. No skull fracture. IMPRESSION: 1. No acute intracranial abnormality. 2. Moderate-advanced cerebral volume loss and periventricular and subcortical white matter hypoattenuation, likely chronic ischemic microvascular disease. 3. Remote lacunar infarcts in the left basal ganglia. Electronically signed by: Lonni Necessary MD 01/14/2024 09:27 AM EST RP Workstation: [id number]   DG Pelvis 1-2 Views Result Date: 01/13/2024 EXAM: 1 or 2 VIEW(S) XRAY OF THE PELVIS 01/13/2024 03:00:00 PM COMPARISON: Pelvis 10/11/2022. CLINICAL  HISTORY: fall FINDINGS: BONES AND JOINTS: No acute fracture. No malalignment. Degenerative changes in visualized lower lumbar spine. Moderate degenerative changes of bilateral hip joints with osteophyte formation and mild-to-moderate joint space narrowing. SOFT TISSUES: The soft tissues are unremarkable. IMPRESSION: 1. No evidence of acute traumatic injury. Electronically signed by: Morgane Naveau MD 01/13/2024 03:24 PM EST RP Workstation: [id number]   DG Chest 2 View Result Date: 01/13/2024 EXAM: 2 VIEW(S) XRAY OF THE CHEST 01/13/2024 03:00:00 PM COMPARISON: 10/11/2022 CLINICAL HISTORY: fall FINDINGS: LINES, TUBES AND DEVICES: Right Port-A-Cath in place with tip overlying right atrium. LUNGS AND PLEURA: Stable biapical pleuroparenchymal scarring. No pleural effusion. No pneumothorax. HEART AND MEDIASTINUM: No acute abnormality of the cardiac and mediastinal silhouettes. BONES AND SOFT TISSUES: Thoracic degenerative changes. No acute osseous abnormality. IMPRESSION: 1. No acute cardiopulmonary abnormality. 2. Right Port-A-Cath with tip overlying the right atrium. Electronically signed by: Morgane Naveau MD 01/13/2024 03:23 PM EST RP  Workstation: [id number]    Microbiology: Recent Results (from the past 240 hours)  Resp panel by RT-PCR (RSV, Flu A&B, Covid) Anterior Nasal Swab     Status: None   Collection Time: 01/13/24  6:50 AM   Specimen: Anterior Nasal Swab  Result Value Ref Range Status   SARS Coronavirus 2 by RT PCR NEGATIVE NEGATIVE Final    Comment: (NOTE) SARS-CoV-2 target nucleic acids are NOT DETECTED.  The SARS-CoV-2 RNA is generally detectable in upper respiratory specimens during the acute phase of infection. The lowest concentration of SARS-CoV-2 viral copies this assay can detect is 138 copies/mL. A negative result does not preclude SARS-Cov-2 infection and should not be used as the sole basis for treatment or other patient management decisions. A negative result may occur with  improper specimen collection/handling, submission of specimen other than nasopharyngeal swab, presence of viral mutation(s) within the areas targeted by this assay, and inadequate number of viral copies(<138 copies/mL). A negative result must be combined with clinical observations, patient history, and epidemiological information. The expected result is Negative.  Fact Sheet for Patients:  bloggercourse.com  Fact Sheet for Healthcare Providers:  seriousbroker.it  This test is no t yet approved or cleared by the [place]  FDA and  has been authorized for detection and/or diagnosis of SARS-CoV-2 by FDA under an Emergency Use Authorization (EUA). This EUA will remain  in effect (meaning this test can be used) for the duration of the COVID-19 declaration under Section 564(b)(1) of the Act, 21 U.S.C.section 360bbb-3(b)(1), unless the authorization is terminated  or revoked sooner.       Influenza A by PCR NEGATIVE NEGATIVE Final   Influenza B by PCR NEGATIVE NEGATIVE Final    Comment: (NOTE) The Xpert Xpress SARS-CoV-2/FLU/RSV plus assay is intended as an aid in  the diagnosis of influenza from Nasopharyngeal swab specimens and should not be used as a sole basis for treatment. Nasal washings and aspirates are unacceptable for Xpert Xpress SARS-CoV-2/FLU/RSV testing.  Fact Sheet for Patients: bloggercourse.com  Fact Sheet for Healthcare Providers: seriousbroker.it  This test is not yet approved or cleared by the [place]  FDA and has been authorized for detection and/or diagnosis of SARS-CoV-2 by FDA under an Emergency Use Authorization (EUA). This EUA will remain in effect (meaning this test can be used) for the duration of the COVID-19 declaration under Section 564(b)(1) of the Act, 21 U.S.C. section 360bbb-3(b)(1), unless the authorization is terminated or revoked.     Resp Syncytial Virus by PCR NEGATIVE NEGATIVE Final    Comment: (NOTE) Fact  Sheet for Patients: bloggercourse.com  Fact Sheet for Healthcare Providers: seriousbroker.it  This test is not yet approved or cleared by the [place]  FDA and has been authorized for detection and/or diagnosis of SARS-CoV-2 by FDA under an Emergency Use Authorization (EUA). This EUA will remain in effect (meaning this test can be used) for the duration of the COVID-19 declaration under Section 564(b)(1) of the Act, 21 U.S.C. section 360bbb-3(b)(1), unless the authorization is terminated or revoked.  Performed at [facility], [address] [place], [place] [address]   Culture, blood (routine x 2)     Status: None (Preliminary result)   Collection Time: 01/13/24 12:10 PM   Specimen: BLOOD  Result Value Ref Range Status   Specimen Description BLOOD RIGHT ANTECUBITAL  Final   Special Requests   Final    BOTTLES DRAWN AEROBIC AND ANAEROBIC Blood Culture results may not be optimal due to an inadequate volume of blood received in culture bottles   Culture   Final    NO GROWTH  < 24 HOURS Performed at [facility], [address] [place], [place] [address]    Report Status PENDING  Incomplete  Culture, blood (routine x 2)     Status: None (Preliminary result)   Collection Time: 01/13/24 12:22 PM   Specimen: BLOOD  Result Value Ref Range Status   Specimen Description BLOOD LEFT ANTECUBITAL  Final   Special Requests   Final    BOTTLES DRAWN AEROBIC AND ANAEROBIC Blood Culture results may not be optimal due to an inadequate volume of blood received in culture bottles   Culture   Final    NO GROWTH < 24 HOURS Performed at [facility], [address] [place], [place] [address]    Report Status PENDING  Incomplete     Labs: CBC: Recent Labs  Lab 01/13/24 1158 01/14/24 0518  WBC 9.4 7.4  HGB 13.7 11.7*  HCT 42.3 35.3*  MCV 96.4 95.7  PLT 154 148*   Basic Metabolic Panel: Recent Labs  Lab 01/13/24 1158 01/14/24 0518  NA 139 140  K 4.5 3.9  CL 100 105  CO2 26 22  GLUCOSE 151* 110*  BUN 13 15  CREATININE 0.98 0.81  CALCIUM 10.3 8.9  MG 2.1  --   PHOS  --  3.6   Liver Function Tests: Recent Labs  Lab 01/13/24 1158  AST 59*  ALT 26  ALKPHOS 73  BILITOT 0.5  PROT 8.1  ALBUMIN 4.8   No results for input(s): LIPASE, AMYLASE in the last 168 hours. No results for input(s): AMMONIA in the last 168 hours. Cardiac Enzymes: No results for input(s): CKTOTAL, CKMB, CKMBINDEX, TROPONINI in the last 168 hours. BNP (last 3 results) No results for input(s): BNP in the last 8760 hours. CBG: Recent Labs  Lab 01/13/24 1653 01/13/24 2154 01/14/24 0850 01/14/24 1147  GLUCAP 100* 118* 114* 123*    Time spent: 35 minutes  Signed:  Elvan Sor  [facility] 01/14/2024 1:37 PM      [1]  Allergies Allergen Reactions   Aspirin     Stomach cramp  Other Reaction(s): abdominal pain, Not available   Latex Swelling    Other Reaction(s): Angioedema, Not available   Other    Tape Rash    Makes  Whelps on Skin Makes Whelps on Skin   Wound Dressing Adhesive Rash    Makes Whelps on Skin, Prefers paper tape

## 2024-01-15 ENCOUNTER — Other Ambulatory Visit: Payer: Self-pay

## 2024-01-16 ENCOUNTER — Other Ambulatory Visit: Payer: Self-pay | Admitting: Student

## 2024-01-16 LAB — URINE CULTURE: Culture: >=100000 — AB

## 2024-01-16 MED ORDER — CIPROFLOXACIN HCL 500 MG PO TABS: 500.0000 mg | ORAL_TABLET | Freq: Two times a day (BID) | ORAL | 0 refills | Status: AC

## 2024-01-16 NOTE — Progress Notes (Deleted)
 {  Select_[facility]_Note:[id number]}

## 2024-01-18 LAB — CULTURE, BLOOD (ROUTINE X 2)
Culture: NO GROWTH
Culture: NO GROWTH
